# Patient Record
Sex: Male | Born: 1945
Health system: Southern US, Community
[De-identification: ages and names within clinical notes are randomized; demographics above are authoritative.]

## PROBLEM LIST (undated history)

## (undated) DIAGNOSIS — Z9622 Myringotomy tube(s) status: Secondary | ICD-10-CM

## (undated) DIAGNOSIS — I10 Essential (primary) hypertension: Secondary | ICD-10-CM

## (undated) DIAGNOSIS — F329 Major depressive disorder, single episode, unspecified: Secondary | ICD-10-CM

## (undated) DIAGNOSIS — E039 Hypothyroidism, unspecified: Secondary | ICD-10-CM

## (undated) DIAGNOSIS — D696 Thrombocytopenia, unspecified: Secondary | ICD-10-CM

## (undated) DIAGNOSIS — E785 Hyperlipidemia, unspecified: Secondary | ICD-10-CM

## (undated) DIAGNOSIS — F32A Depression, unspecified: Secondary | ICD-10-CM

## (undated) DIAGNOSIS — N9989 Other postprocedural complications and disorders of genitourinary system: Secondary | ICD-10-CM

## (undated) DIAGNOSIS — C4491 Basal cell carcinoma of skin, unspecified: Secondary | ICD-10-CM

## (undated) DIAGNOSIS — T7840XA Allergy, unspecified, initial encounter: Secondary | ICD-10-CM

## (undated) DIAGNOSIS — H269 Unspecified cataract: Secondary | ICD-10-CM

## (undated) DIAGNOSIS — M1990 Unspecified osteoarthritis, unspecified site: Secondary | ICD-10-CM

## (undated) DIAGNOSIS — E119 Type 2 diabetes mellitus without complications: Secondary | ICD-10-CM

## (undated) DIAGNOSIS — H6613 Chronic tubotympanic suppurative otitis media, bilateral: Secondary | ICD-10-CM

## (undated) DIAGNOSIS — H6980 Other specified disorders of Eustachian tube, unspecified ear: Secondary | ICD-10-CM

## (undated) DIAGNOSIS — I2510 Atherosclerotic heart disease of native coronary artery without angina pectoris: Secondary | ICD-10-CM

## (undated) DIAGNOSIS — F419 Anxiety disorder, unspecified: Secondary | ICD-10-CM

## (undated) DIAGNOSIS — R338 Other retention of urine: Secondary | ICD-10-CM

## (undated) DIAGNOSIS — G43909 Migraine, unspecified, not intractable, without status migrainosus: Secondary | ICD-10-CM

## (undated) DIAGNOSIS — G459 Transient cerebral ischemic attack, unspecified: Secondary | ICD-10-CM

## (undated) DIAGNOSIS — K648 Other hemorrhoids: Secondary | ICD-10-CM

## (undated) HISTORY — DX: Thrombocytopenia, unspecified: D69.6

## (undated) HISTORY — DX: Migraine, unspecified, not intractable, without status migrainosus: G43.909

## (undated) HISTORY — DX: Hypothyroidism, unspecified: E03.9

## (undated) HISTORY — DX: Major depressive disorder, single episode, unspecified: F32.9

## (undated) HISTORY — DX: Myringotomy tube(s) status: Z96.22

## (undated) HISTORY — DX: Other postprocedural complications and disorders of genitourinary system: N99.89

## (undated) HISTORY — DX: Other hemorrhoids: K64.8

## (undated) HISTORY — DX: Depression, unspecified: F32.A

## (undated) HISTORY — DX: Basal cell carcinoma of skin, unspecified: C44.91

## (undated) HISTORY — PX: TONSILLECTOMY: SUR1361

## (undated) HISTORY — DX: Allergy, unspecified, initial encounter: T78.40XA

## (undated) HISTORY — DX: Hyperlipidemia, unspecified: E78.5

## (undated) HISTORY — DX: Transient cerebral ischemic attack, unspecified: G45.9

## (undated) HISTORY — DX: Atherosclerotic heart disease of native coronary artery without angina pectoris: I25.10

## (undated) HISTORY — DX: Essential (primary) hypertension: I10

## (undated) HISTORY — DX: Other retention of urine: R33.8

## (undated) HISTORY — DX: Unspecified cataract: H26.9

## (undated) HISTORY — PX: UPPER GI ENDOSCOPY: SHX6162

## (undated) HISTORY — DX: Unspecified osteoarthritis, unspecified site: M19.90

## (undated) HISTORY — PX: OTHER SURGICAL HISTORY: SHX169

---

## 1999-06-28 ENCOUNTER — Ambulatory Visit (HOSPITAL_COMMUNITY): Admission: RE | Admit: 1999-06-28 | Discharge: 1999-06-28 | Payer: Self-pay | Admitting: Internal Medicine

## 1999-06-28 ENCOUNTER — Encounter: Payer: Self-pay | Admitting: Internal Medicine

## 2001-09-28 ENCOUNTER — Ambulatory Visit (HOSPITAL_BASED_OUTPATIENT_CLINIC_OR_DEPARTMENT_OTHER): Admission: RE | Admit: 2001-09-28 | Discharge: 2001-09-28 | Payer: Self-pay | Admitting: General Surgery

## 2001-12-16 ENCOUNTER — Emergency Department (HOSPITAL_COMMUNITY): Admission: EM | Admit: 2001-12-16 | Discharge: 2001-12-16 | Payer: Self-pay | Admitting: *Deleted

## 2002-02-21 ENCOUNTER — Inpatient Hospital Stay (HOSPITAL_COMMUNITY): Admission: EM | Admit: 2002-02-21 | Discharge: 2002-02-23 | Payer: Self-pay | Admitting: Emergency Medicine

## 2002-02-22 ENCOUNTER — Encounter: Payer: Self-pay | Admitting: Family Medicine

## 2002-03-13 ENCOUNTER — Encounter: Admission: RE | Admit: 2002-03-13 | Discharge: 2002-03-13 | Payer: Self-pay | Admitting: Family Medicine

## 2002-03-13 ENCOUNTER — Encounter: Payer: Self-pay | Admitting: Family Medicine

## 2002-03-25 ENCOUNTER — Ambulatory Visit (HOSPITAL_COMMUNITY): Admission: RE | Admit: 2002-03-25 | Discharge: 2002-03-25 | Payer: Self-pay | Admitting: Family Medicine

## 2002-03-25 ENCOUNTER — Encounter: Payer: Self-pay | Admitting: Family Medicine

## 2002-06-29 ENCOUNTER — Emergency Department (HOSPITAL_COMMUNITY): Admission: EM | Admit: 2002-06-29 | Discharge: 2002-06-29 | Payer: Self-pay

## 2002-07-10 HISTORY — PX: THYROIDECTOMY, PARTIAL: SHX18

## 2002-07-31 ENCOUNTER — Ambulatory Visit (HOSPITAL_COMMUNITY): Admission: RE | Admit: 2002-07-31 | Discharge: 2002-08-01 | Payer: Self-pay | Admitting: General Surgery

## 2002-11-14 ENCOUNTER — Ambulatory Visit (HOSPITAL_COMMUNITY): Admission: RE | Admit: 2002-11-14 | Discharge: 2002-11-14 | Payer: Self-pay | Admitting: *Deleted

## 2003-06-09 ENCOUNTER — Ambulatory Visit (HOSPITAL_COMMUNITY): Admission: RE | Admit: 2003-06-09 | Discharge: 2003-06-10 | Payer: Self-pay | Admitting: *Deleted

## 2003-06-09 ENCOUNTER — Encounter: Payer: Self-pay | Admitting: *Deleted

## 2003-12-26 ENCOUNTER — Encounter: Payer: Self-pay | Admitting: Gastroenterology

## 2004-09-15 ENCOUNTER — Ambulatory Visit: Payer: Self-pay | Admitting: Internal Medicine

## 2004-10-10 DIAGNOSIS — G459 Transient cerebral ischemic attack, unspecified: Secondary | ICD-10-CM

## 2004-10-10 HISTORY — DX: Transient cerebral ischemic attack, unspecified: G45.9

## 2004-12-14 ENCOUNTER — Ambulatory Visit: Payer: Self-pay | Admitting: Family Medicine

## 2005-02-09 ENCOUNTER — Ambulatory Visit: Payer: Self-pay | Admitting: Family Medicine

## 2005-02-21 ENCOUNTER — Ambulatory Visit: Payer: Self-pay | Admitting: *Deleted

## 2005-06-06 ENCOUNTER — Ambulatory Visit: Payer: Self-pay | Admitting: Family Medicine

## 2005-06-20 ENCOUNTER — Ambulatory Visit (HOSPITAL_COMMUNITY): Admission: RE | Admit: 2005-06-20 | Discharge: 2005-06-20 | Payer: Self-pay | Admitting: Orthopedic Surgery

## 2005-09-22 ENCOUNTER — Ambulatory Visit: Payer: Self-pay | Admitting: Cardiology

## 2005-10-10 HISTORY — PX: HERNIA REPAIR: SHX51

## 2006-06-06 ENCOUNTER — Ambulatory Visit: Payer: Self-pay | Admitting: Family Medicine

## 2006-07-03 ENCOUNTER — Ambulatory Visit: Payer: Self-pay | Admitting: Cardiology

## 2006-07-25 ENCOUNTER — Ambulatory Visit (HOSPITAL_COMMUNITY): Admission: RE | Admit: 2006-07-25 | Discharge: 2006-07-26 | Payer: Self-pay | Admitting: General Surgery

## 2006-12-07 ENCOUNTER — Encounter: Payer: Self-pay | Admitting: Family Medicine

## 2007-06-12 ENCOUNTER — Ambulatory Visit: Payer: Self-pay | Admitting: Cardiology

## 2007-06-19 ENCOUNTER — Ambulatory Visit: Payer: Self-pay

## 2007-06-19 ENCOUNTER — Encounter: Payer: Self-pay | Admitting: Family Medicine

## 2007-06-29 ENCOUNTER — Encounter (INDEPENDENT_AMBULATORY_CARE_PROVIDER_SITE_OTHER): Payer: Self-pay | Admitting: *Deleted

## 2007-07-05 ENCOUNTER — Encounter: Payer: Self-pay | Admitting: Family Medicine

## 2007-07-10 ENCOUNTER — Ambulatory Visit: Payer: Self-pay | Admitting: Family Medicine

## 2007-07-10 DIAGNOSIS — F329 Major depressive disorder, single episode, unspecified: Secondary | ICD-10-CM

## 2007-07-10 DIAGNOSIS — E785 Hyperlipidemia, unspecified: Secondary | ICD-10-CM

## 2007-07-10 DIAGNOSIS — E039 Hypothyroidism, unspecified: Secondary | ICD-10-CM

## 2007-07-10 DIAGNOSIS — M159 Polyosteoarthritis, unspecified: Secondary | ICD-10-CM

## 2007-07-10 DIAGNOSIS — E1169 Type 2 diabetes mellitus with other specified complication: Secondary | ICD-10-CM

## 2007-07-10 DIAGNOSIS — R51 Headache: Secondary | ICD-10-CM

## 2007-07-10 DIAGNOSIS — M1990 Unspecified osteoarthritis, unspecified site: Secondary | ICD-10-CM

## 2007-07-10 HISTORY — DX: Type 2 diabetes mellitus with other specified complication: E11.69

## 2007-07-10 HISTORY — DX: Major depressive disorder, single episode, unspecified: F32.9

## 2007-07-10 HISTORY — DX: Polyosteoarthritis, unspecified: M15.9

## 2007-07-10 HISTORY — DX: Hyperlipidemia, unspecified: E78.5

## 2007-07-10 HISTORY — DX: Hypothyroidism, unspecified: E03.9

## 2007-08-30 ENCOUNTER — Ambulatory Visit: Payer: Self-pay | Admitting: Family Medicine

## 2007-10-01 ENCOUNTER — Encounter: Payer: Self-pay | Admitting: Family Medicine

## 2008-01-30 ENCOUNTER — Encounter: Payer: Self-pay | Admitting: Family Medicine

## 2008-05-26 ENCOUNTER — Encounter: Payer: Self-pay | Admitting: Family Medicine

## 2008-06-18 ENCOUNTER — Ambulatory Visit: Payer: Self-pay | Admitting: Cardiology

## 2008-07-08 ENCOUNTER — Ambulatory Visit: Payer: Self-pay | Admitting: Family Medicine

## 2008-11-12 ENCOUNTER — Encounter: Payer: Self-pay | Admitting: Family Medicine

## 2008-12-04 ENCOUNTER — Encounter: Payer: Self-pay | Admitting: Family Medicine

## 2008-12-05 ENCOUNTER — Encounter: Payer: Self-pay | Admitting: Cardiology

## 2008-12-08 ENCOUNTER — Encounter: Payer: Self-pay | Admitting: Cardiology

## 2008-12-11 ENCOUNTER — Encounter: Payer: Self-pay | Admitting: Cardiology

## 2008-12-11 ENCOUNTER — Ambulatory Visit: Payer: Self-pay | Admitting: Cardiology

## 2008-12-11 DIAGNOSIS — I10 Essential (primary) hypertension: Secondary | ICD-10-CM

## 2008-12-11 DIAGNOSIS — I209 Angina pectoris, unspecified: Secondary | ICD-10-CM | POA: Insufficient documentation

## 2008-12-11 DIAGNOSIS — I2510 Atherosclerotic heart disease of native coronary artery without angina pectoris: Secondary | ICD-10-CM

## 2008-12-11 HISTORY — DX: Essential (primary) hypertension: I10

## 2008-12-11 HISTORY — DX: Atherosclerotic heart disease of native coronary artery without angina pectoris: I25.10

## 2008-12-11 HISTORY — DX: Angina pectoris, unspecified: I20.9

## 2008-12-11 LAB — CONVERTED CEMR LAB
BUN: 20 mg/dL (ref 6–23)
Basophils Absolute: 0 10*3/uL (ref 0.0–0.1)
Basophils Relative: 0.5 % (ref 0.0–3.0)
CO2: 30 meq/L (ref 19–32)
Calcium: 8.7 mg/dL (ref 8.4–10.5)
Chloride: 107 meq/L (ref 96–112)
Creatinine, Ser: 1.2 mg/dL (ref 0.4–1.5)
Eosinophils Absolute: 0.6 10*3/uL (ref 0.0–0.7)
Eosinophils Relative: 12.2 % — ABNORMAL HIGH (ref 0.0–5.0)
GFR calc Af Amer: 78 mL/min
GFR calc non Af Amer: 65 mL/min
Glucose, Bld: 120 mg/dL — ABNORMAL HIGH (ref 70–99)
HCT: 42.1 % (ref 39.0–52.0)
Hemoglobin: 14 g/dL (ref 13.0–17.0)
INR: 1 (ref 0.8–1.0)
Lymphocytes Relative: 24.1 % (ref 12.0–46.0)
MCHC: 33.3 g/dL (ref 30.0–36.0)
MCV: 87.9 fL (ref 78.0–100.0)
Monocytes Absolute: 0.5 10*3/uL (ref 0.1–1.0)
Monocytes Relative: 8.8 % (ref 3.0–12.0)
Neutro Abs: 2.8 10*3/uL (ref 1.4–7.7)
Neutrophils Relative %: 54.4 % (ref 43.0–77.0)
Platelets: 139 10*3/uL — ABNORMAL LOW (ref 150–400)
Potassium: 3.8 meq/L (ref 3.5–5.1)
Prothrombin Time: 10.8 s — ABNORMAL LOW (ref 10.9–13.3)
RBC: 4.79 M/uL (ref 4.22–5.81)
RDW: 12.5 % (ref 11.5–14.6)
Sodium: 141 meq/L (ref 135–145)
WBC: 5.2 10*3/uL (ref 4.5–10.5)
aPTT: 24.5 s (ref 21.7–29.8)

## 2008-12-16 ENCOUNTER — Ambulatory Visit: Payer: Self-pay | Admitting: Cardiology

## 2008-12-16 ENCOUNTER — Inpatient Hospital Stay (HOSPITAL_BASED_OUTPATIENT_CLINIC_OR_DEPARTMENT_OTHER): Admission: RE | Admit: 2008-12-16 | Discharge: 2008-12-16 | Payer: Self-pay | Admitting: Cardiology

## 2009-01-02 ENCOUNTER — Ambulatory Visit: Payer: Self-pay | Admitting: Cardiology

## 2009-01-02 ENCOUNTER — Encounter: Payer: Self-pay | Admitting: *Deleted

## 2009-02-26 ENCOUNTER — Encounter: Payer: Self-pay | Admitting: Cardiology

## 2009-02-26 ENCOUNTER — Encounter: Payer: Self-pay | Admitting: Family Medicine

## 2009-04-27 ENCOUNTER — Encounter (INDEPENDENT_AMBULATORY_CARE_PROVIDER_SITE_OTHER): Payer: Self-pay | Admitting: *Deleted

## 2009-06-30 ENCOUNTER — Encounter: Payer: Self-pay | Admitting: Family Medicine

## 2009-06-30 ENCOUNTER — Encounter: Payer: Self-pay | Admitting: Cardiology

## 2009-07-08 ENCOUNTER — Ambulatory Visit: Payer: Self-pay | Admitting: Family Medicine

## 2009-07-08 DIAGNOSIS — J069 Acute upper respiratory infection, unspecified: Secondary | ICD-10-CM | POA: Insufficient documentation

## 2009-07-16 ENCOUNTER — Ambulatory Visit: Payer: Self-pay | Admitting: Cardiology

## 2009-08-18 ENCOUNTER — Ambulatory Visit: Payer: Self-pay | Admitting: Family Medicine

## 2009-08-18 LAB — CONVERTED CEMR LAB
OCCULT 1: NEGATIVE
OCCULT 2: NEGATIVE
OCCULT 3: NEGATIVE

## 2009-10-10 HISTORY — PX: POLYPECTOMY: SHX149

## 2009-10-10 HISTORY — PX: COLONOSCOPY: SHX174

## 2009-11-06 ENCOUNTER — Encounter: Payer: Self-pay | Admitting: Family Medicine

## 2010-02-05 ENCOUNTER — Encounter: Payer: Self-pay | Admitting: Gastroenterology

## 2010-02-10 ENCOUNTER — Telehealth (INDEPENDENT_AMBULATORY_CARE_PROVIDER_SITE_OTHER): Payer: Self-pay

## 2010-02-23 ENCOUNTER — Encounter: Payer: Self-pay | Admitting: Family Medicine

## 2010-02-24 ENCOUNTER — Encounter (INDEPENDENT_AMBULATORY_CARE_PROVIDER_SITE_OTHER): Payer: Self-pay | Admitting: *Deleted

## 2010-06-21 ENCOUNTER — Encounter: Payer: Self-pay | Admitting: Family Medicine

## 2010-06-21 LAB — CONVERTED CEMR LAB: HDL: 64 mg/dL

## 2010-07-06 ENCOUNTER — Ambulatory Visit: Payer: Self-pay | Admitting: Family Medicine

## 2010-07-06 DIAGNOSIS — E1165 Type 2 diabetes mellitus with hyperglycemia: Secondary | ICD-10-CM | POA: Insufficient documentation

## 2010-07-06 DIAGNOSIS — E1151 Type 2 diabetes mellitus with diabetic peripheral angiopathy without gangrene: Secondary | ICD-10-CM

## 2010-07-06 DIAGNOSIS — E119 Type 2 diabetes mellitus without complications: Secondary | ICD-10-CM

## 2010-07-06 HISTORY — DX: Type 2 diabetes mellitus without complications: E11.9

## 2010-07-08 ENCOUNTER — Ambulatory Visit: Payer: Self-pay | Admitting: Family Medicine

## 2010-07-08 ENCOUNTER — Telehealth: Payer: Self-pay | Admitting: Gastroenterology

## 2010-07-08 DIAGNOSIS — K921 Melena: Secondary | ICD-10-CM | POA: Insufficient documentation

## 2010-07-12 ENCOUNTER — Ambulatory Visit: Payer: Self-pay | Admitting: Internal Medicine

## 2010-07-12 ENCOUNTER — Encounter: Payer: Self-pay | Admitting: Gastroenterology

## 2010-07-12 DIAGNOSIS — Z860100 Personal history of colon polyps, unspecified: Secondary | ICD-10-CM | POA: Insufficient documentation

## 2010-07-12 DIAGNOSIS — K648 Other hemorrhoids: Secondary | ICD-10-CM | POA: Insufficient documentation

## 2010-07-12 DIAGNOSIS — R195 Other fecal abnormalities: Secondary | ICD-10-CM

## 2010-07-12 DIAGNOSIS — K625 Hemorrhage of anus and rectum: Secondary | ICD-10-CM | POA: Insufficient documentation

## 2010-07-12 DIAGNOSIS — E0590 Thyrotoxicosis, unspecified without thyrotoxic crisis or storm: Secondary | ICD-10-CM

## 2010-07-12 DIAGNOSIS — I2510 Atherosclerotic heart disease of native coronary artery without angina pectoris: Secondary | ICD-10-CM

## 2010-07-12 HISTORY — DX: Thyrotoxicosis, unspecified without thyrotoxic crisis or storm: E05.90

## 2010-07-12 HISTORY — DX: Personal history of colonic polyps: Z86.010

## 2010-07-12 HISTORY — DX: Personal history of colon polyps, unspecified: Z86.0100

## 2010-07-22 ENCOUNTER — Ambulatory Visit: Payer: Self-pay | Admitting: Cardiology

## 2010-07-22 DIAGNOSIS — R0989 Other specified symptoms and signs involving the circulatory and respiratory systems: Secondary | ICD-10-CM

## 2010-07-22 HISTORY — DX: Other specified symptoms and signs involving the circulatory and respiratory systems: R09.89

## 2010-07-27 ENCOUNTER — Ambulatory Visit: Payer: Self-pay | Admitting: Gastroenterology

## 2010-07-28 ENCOUNTER — Encounter: Payer: Self-pay | Admitting: Gastroenterology

## 2010-08-05 ENCOUNTER — Ambulatory Visit: Payer: Self-pay

## 2010-08-05 ENCOUNTER — Encounter: Payer: Self-pay | Admitting: Cardiology

## 2010-10-28 ENCOUNTER — Ambulatory Visit
Admission: RE | Admit: 2010-10-28 | Discharge: 2010-10-28 | Payer: Self-pay | Source: Home / Self Care | Attending: Family Medicine | Admitting: Family Medicine

## 2010-10-28 DIAGNOSIS — J1189 Influenza due to unidentified influenza virus with other manifestations: Secondary | ICD-10-CM

## 2010-10-28 LAB — CONVERTED CEMR LAB: Inflenza A Ag: NEGATIVE

## 2010-11-09 NOTE — Letter (Signed)
Summary: Patient Notice- Polyp Results  [facility]  [address] [place], [place] [address]   Phone: [phone]  Fax: [phone]        July 28, 2010 MRN: [id number]    [name] [address] [place], [place]  [address]    Dear [name],  I am pleased to inform you that the colon polyp(s) removed during your recent colonoscopy was (were) found to be benign (no cancer detected) upon pathologic examination.  I recommend you have a repeat colonoscopy examination in 5 years to look for recurrent polyps, as having colon polyps increases your risk for having recurrent polyps or even colon cancer in the future.  Should you develop new or worsening symptoms of abdominal pain, bowel habit changes or bleeding from the rectum or bowels, please schedule an evaluation with either your primary care physician or with me.  Continue treatment plan as outlined the day of your exam.  Please call us if you are having persistent problems or have questions about your condition that have not been fully answered at this time.  Sincerely,  Meryl Dare MD [facility]  This letter has been electronically signed by your physician.  Appended Document: Patient Notice- Polyp Results letter mailed

## 2010-11-09 NOTE — Letter (Signed)
Summary: [facility] Instructions  [facility]  [address] [place], [place] [address]   Phone: [phone]  Fax: [phone]       [name]    [birth year]    MRN: [id number]        Procedure Day /Date: 07-27-10     Arrival Time: 9:00 AM      Procedure Time: 10:00 AM     Location of Procedure:                    X      [facility] (4th Floor)                       PREPARATION FOR COLONOSCOPY WITH MOVIPREP   Starting 5 days prior to your procedure 07-22-10 do not eat nuts, seeds, popcorn, corn, beans, peas,  salads, or any raw vegetables.  Do not take any fiber supplements (e.g. Metamucil, Citrucel, and Benefiber).  THE DAY BEFORE YOUR PROCEDURE         DATE: 07-26-10  DAY: Monday  1.  Drink clear liquids the entire day-NO SOLID FOOD  2.  Do not drink anything colored red or purple.  Avoid juices with pulp.  No orange juice.  3.  Drink at least 64 oz. (8 glasses) of fluid/clear liquids during the day to prevent dehydration and help the prep work efficiently.  CLEAR LIQUIDS INCLUDE: Water Jello Ice Popsicles Tea (sugar ok, no milk/cream) Powdered fruit flavored drinks Coffee (sugar ok, no milk/cream) Gatorade Juice: apple, white grape, white cranberry  Lemonade Clear bullion, consomm, broth Carbonated beverages (any kind) Strained chicken noodle soup Hard Candy                             4.  In the morning, mix first dose of MoviPrep solution:    Empty 1 Pouch A and 1 Pouch B into the disposable container    Add lukewarm drinking water to the top line of the container. Mix to dissolve    Refrigerate (mixed solution should be used within 24 hrs)  5.  Begin drinking the prep at 5:00 p.m. The MoviPrep container is divided by 4 marks.   Every 15 minutes drink the solution down to the next mark (approximately 8 oz) until the full liter is complete.   6.  Follow completed prep with 16 oz of clear liquid of your choice (Nothing red or  purple).  Continue to drink clear liquids until bedtime.  7.  Before going to bed, mix second dose of MoviPrep solution:    Empty 1 Pouch A and 1 Pouch B into the disposable container    Add lukewarm drinking water to the top line of the container. Mix to dissolve    Refrigerate  THE DAY OF YOUR PROCEDURE      DATE: 07-27-10 DAY: Tuesday  Beginning at 5:00 AM  (5 hours before procedure):         1. Every 15 minutes, drink the solution down to the next mark (approx 8 oz) until the full liter is complete.  2. Follow completed prep with 16 oz. of clear liquid of your choice.    3. You may drink clear liquids until 8:00 AM (2 HOURS BEFORE PROCEDURE).   MEDICATION INSTRUCTIONS  Unless otherwise instructed, you should take regular prescription medications with a small sip of water   as early as possible  the morning of your procedure.  Stay on the Aspirin you are taking.         OTHER INSTRUCTIONS  You will need a responsible adult at least 67 years of age to accompany you and drive you home.   This person must remain in the waiting room during your procedure.  Wear loose fitting clothing that is easily removed.  Leave jewelry and other valuables at home.  However, you may wish to bring a book to read or  an iPod/MP3 player to listen to music as you wait for your procedure to start.  Remove all body piercing jewelry and leave at home.  Total time from sign-in until discharge is approximately 2-3 hours.  You should go home directly after your procedure and rest.  You can resume normal activities the  day after your procedure.  The day of your procedure you should not:   Drive   Make legal decisions   Operate machinery   Drink alcohol   Return to work  You will receive specific instructions about eating, activities and medications before you leave.    The above instructions have been reviewed and explained to me by   _______________________    I fully  understand and can verbalize these instructions _____________________________ Date _________

## 2010-11-09 NOTE — Progress Notes (Signed)
Summary: Blood in stools   Phone Note From Other Clinic   Caller: [phone] Renee @ Dr Laury Axon Call For: Dr Russella Dar Summary of Call: Blood in stools would like to schedule appt for pt to be seen before Dec which is next available  Initial call taken by: Leanor Kail [facility],  July 08, 2010 4:28 PM  Follow-up for Phone Call        Renee cant reach pt. tonight for an appt. tomorrow, next week will be fine for pt. to be seen by an extender. I will call Renee back with an appt. when the extenders schedule is available.  Follow-up by: Laureen Ochs LPN,  July 08, 2010 4:36 PM  Additional Follow-up for Phone Call Additional follow up Details #1::        Pt. will see Willette Cluster NP on 07-12-10 at 10am. Renee will advise pt. of appt/med.list/co-pay/cx.policy. Additional Follow-up by: Laureen Ochs LPN,  July 09, 2010 8:54 AM

## 2010-11-09 NOTE — Progress Notes (Signed)
Summary: Med List Brought by Patient  Med List Brought by Patient   Imported By: Lanelle Bal 07/27/2010 14:28:12  _____________________________________________________________________  External Attachment:    Type:   Image     Comment:   External Document

## 2010-11-09 NOTE — Procedures (Signed)
Summary: COLONOSCOPY   Colonoscopy  Procedure date:  12/26/2003  Findings:      Location:  [facility].     Patient Name: [name], [name] MRN:  Procedure Procedures: Colonoscopy CPT: [phone].    with Hot Biopsy(s)CPT: [id number].  Personnel: Endoscopist: Venita Lick. Russella Dar, MD, [facility].  Exam Location: Exam performed in Outpatient Clinic. Outpatient  Patient Consent: Procedure, Alternatives, Risks and Benefits discussed, consent obtained, from patient. Consent was obtained by the RN.  Indications Symptoms: Hematochezia.  History  Current Medications: Patient is not currently taking Coumadin.  Pre-Exam Physical: Performed Dec 26, 2003. Entire physical exam was normal.  Exam Exam: Extent of exam reached: Cecum, extent intended: Cecum.  The cecum was identified by appendiceal orifice and IC valve. Colon retroflexion performed. ASA Classification: II. Tolerance: good.  Monitoring: Pulse and BP monitoring, Oximetry used. Supplemental O2 given.  Colon Prep Used Miralax for colon prep. Prep results: good.  Sedation Meds: Patient assessed and found to be appropriate for moderate (conscious) sedation. Fentanyl 75 mcg. given IV. Versed 7 mg. given IV.  Findings MULTIPLE POLYPS: Descending Colon to Sigmoid Colon. minimum size 3 mm, maximum size 4 mm. Procedure:  hot biopsy, removed, Polyp retrieved, 2 polyps Polyps sent to pathology. ICD9: Colon Polyps: 211.3.  NORMAL EXAM: Cecum to Splenic Flexure.  HEMORRHOIDS: Internal. Size: Small. Not bleeding. Not thrombosed. ICD9: Hemorrhoids, Internal: 455.0.   Assessment  Diagnoses: 211.3: Colon Polyps.  455.0: Hemorrhoids, Internal.   Events  Unplanned Interventions: No intervention was required.  Unplanned Events: There were no complications. Plans  Post Exam Instructions: No aspirin or non-steroidal containing medications: 2 weeks.  Medication Plan: Await pathology. Continue current medications.  Patient  Education: Patient given standard instructions for: Polyps. Hemorrhoids.  Disposition: After procedure patient sent to recovery. After recovery patient sent home.  Scheduling/Referral: Colonoscopy, to [facility] T. Russella Dar, MD, [facility], around Dec 25, 2008.    This report was created from the original endoscopy report, which was reviewed and signed by the above listed endoscopist.

## 2010-11-09 NOTE — Progress Notes (Signed)
Summary: Discuss colon recall   Phone Note Outgoing Call Call back at [facility] Phone [phone]   Call placed by: Darcey Nora RN, CGRN,  Feb 10, 2010 10:53 AM Call placed to: Patient Summary of Call: Left message for patient to call back to discuss recall letter and letter to Dr Russella Dar. Initial call taken by: Darcey Nora RN, CGRN,  Feb 10, 2010 10:53 AM  Follow-up for Phone Call        I spoke with the patient and reviewed Dr Anselm Jungling recommendations and why he has changed the recall.  I will mail them a copy of the pathology from 2005 for their records showing hyperplastic polyps.  The patient denies any change in bowel habits, rectal bleeding, other GI symptoms or family hx of colon CA.  Patient  will discuss with his wife and if they decide that they would like to proceed with the colon at this time they will call back to schedule.  Patient  is aware that if he would like to have a colon now Dr Russella Dar is happy to accomodate.  I will mail out the path today. Follow-up by: Darcey Nora RN, CGRN,  Feb 10, 2010 11:05 AM

## 2010-11-09 NOTE — Assessment & Plan Note (Signed)
Summary: YEARLY F/U       Allergies Added:   Primary Provider:  Loreen Freud, DO   History of Present Illness: [name] is a pleasant gentleman who has a history of PCI of his LAD in May 2003. His last cardiac  catheterization was performed on December 16, 2008 by Dr. Juanda Chance for exertional chest pain.  He was found to have a 30-40% LAD just distal to the stent.  There was no other obstructive disease noted.  The ejection fraction is 50%. He has been treated medically. I last saw him in October of 2010. Since then he has dyspnea with more extreme activities but not with routine activities. It is relieved with rest. It is not associated with chest pain. There is no orthopnea, PND or pedal edema. He does not have exertional chest pain, palpitations or syncope.  Current Medications (verified): 1)  Toprol Xl 50 Mg Tb24 (Metoprolol Succinate) .Marland Kitchen.. 1 By Mouth Once Daily 2)  Losartan Potassium-Hctz 50-12.5 Mg Tabs (Losartan Potassium-Hctz) .... One Tablet By Mouth Once Daily 3)  Vytorin 10-20 Mg  Tabs (Ezetimibe-Simvastatin) .Marland Kitchen.. 1 By Mouth At Bedtime 4)  Flomax 0.4 Mg  Cp24 (Tamsulosin Hcl) .Marland Kitchen.. 1 By Mouth At Bedtime 5)  Synthroid 112 Mcg  Tabs (Levothyroxine Sodium) .Marland Kitchen.. 1 By Mouth Once Daily 6)  Niaspan 750 Mg  Tbcr (Niacin (Antihyperlipidemic)) .... 2 By Mouth At Bedtime 7)  Celebrex 200 Mg Caps (Celecoxib) .... Take 1 Tab Once Daily 8)  Multivitamins   Tabs (Multiple Vitamin) .... One Tab Daily 9)  Fish Oil  Tabs .Marland Kitchen.. 4 Tabs Daily 10)  Glucosamine-Chondroitin   Caps (Glucosamine-Chondroit-Vit C-Mn) .Marland Kitchen.. 1 Tab Bid 11)  Co Q-10 30 Mg  Caps (Coenzyme Q10) .... Daily 12)  Aspirin 81 Mg  Tabs (Aspirin) .Marland Kitchen.. 1 Qd 13)  Policosanol 10 Mg Caps (Policosanol) .Marland Kitchen.. 1 Tab Qd 14)  Flurbiprofen 100 Mg Tabs (Flurbiprofen) .... As Needed 15)  Nitroglycerin 0.4 Mg/hr Pt24 (Nitroglycerin) .... As Needed 16)  Nasacort Aq 55 Mcg/act Aers (Triamcinolone Acetonide(Nasal)) .... 2 Sprays Each Nostril Once Daily 17)   Cinnamon 500 Mg Caps (Cinnamon) .Marland Kitchen.. 1 Tab By Mouth Two Times A Day 18)  Metformin Hcl 1000 Mg Tabs (Metformin Hcl) .Marland Kitchen.. 1 By Mouth Once Daily 19)  Proctosol Hc 2.5 % Crea (Hydrocortisone) .... As Directed 20)  Moviprep 100 Gm  Solr (Peg-Kcl-Nacl-Nasulf-Na Asc-C) .... As Per Prep Instructions.  Allergies (verified): 1)  ! Codeine 2)  ! Vicodin 3)  ! Crestor (Rosuvastatin Calcium) 4)  ! * Apples, Tomatoes  Past History:  Past Medical History: CORONARY ARTERY DISEASE-S/P TAXUS STENT 2003 LAD HYPERLIPIDEMIA-MIXED  HYPERTENSION, BENIGN HYPERTHYROIDISM HEMORRHOIDS-INTERNAL  PERSONAL HISTORY OF COLONIC POLYPS  DIABETES-TYPE 2 OSTEOARTHRITIS HYPOTHYROIDISM DEPRESSION   Past Surgical History: Coronary artery disease status post placement of drug-eluting stent in  the LAD in 2003, EF 65% then. Tonsillectomy Hernia repair right inguinal  07/25/06 right thyroid lobectomy 07/31/02  Social History: Reviewed history from 07/12/2010 and no changes required. Retired Married  Tobacco Use - Former Alcohol Use - no Drug Use - no no children  Review of Systems       no fevers or chills, productive cough, hemoptysis, dysphasia, odynophagia, melena, hematochezia, dysuria, hematuria, rash, seizure activity, orthopnea, PND, pedal edema, claudication. Remaining systems are negative.   Vital Signs:  Patient profile:   65 year old male Height:      70 inches Weight:      175 pounds BMI:  25.20 Pulse rate:   68 / minute Resp:     16 per minute BP sitting:   107 / 65  (right arm)  Vitals Entered By: Marrion Coy, CNA (July 22, 2010 8:20 AM)  Physical Exam  General:  Well-developed well-nourished in no acute distress.  Skin is warm and dry.  HEENT is normal.  Neck is supple. No thyromegaly.  Chest is clear to auscultation with normal expansion.  Cardiovascular exam is regular rate and rhythm.  Abdominal exam nontender or distended. No masses palpated. bruit  noted. Extremities show no edema. neuro grossly intact    EKG  Procedure date:  07/22/2010  Findings:      Sinus rhythm at a rate of 68. No ST changes.  Impression & Recommendations:  Problem # 1:  ABDOMINAL BRUIT (ICD-785.9)  Schedule abdominal ultrasound to exclude aneurysm.  Orders: Abdominal Aorta Duplex (Abd Aorta Duplex)  Problem # 2:  CORONARY ARTERY DISEASE (ICD-414.00)  Continue aspirin, beta blocker and statin. No recent chest pain. His updated medication list for this problem includes:    Toprol Xl 50 Mg Tb24 (Metoprolol succinate) .Marland Kitchen... 1 by mouth once daily    Aspirin 81 Mg Tabs (Aspirin) .Marland Kitchen... 1 qd    Nitroglycerin 0.4 Mg/hr Pt24 (Nitroglycerin) .Marland Kitchen... As needed  His updated medication list for this problem includes:    Toprol Xl 50 Mg Tb24 (Metoprolol succinate) .Marland Kitchen... 1 by mouth once daily    Aspirin 81 Mg Tabs (Aspirin) .Marland Kitchen... 1 qd    Nitroglycerin 0.4 Mg/hr Pt24 (Nitroglycerin) .Marland Kitchen... As needed  Orders: EKG w/ Interpretation (93000)  Problem # 3:  HYPERLIPIDEMIA-MIXED (ICD-272.4)  Continue present medications. Lipids and liver monitored by primary care. His updated medication list for this problem includes:    Vytorin 10-20 Mg Tabs (Ezetimibe-simvastatin) .Marland Kitchen... 1 by mouth at bedtime    Niaspan 750 Mg Tbcr (Niacin (antihyperlipidemic)) .Marland Kitchen... 2 by mouth at bedtime    Policosanol 10 Mg Caps (Policosanol) .Marland Kitchen... 1 tab qd  His updated medication list for this problem includes:    Vytorin 10-20 Mg Tabs (Ezetimibe-simvastatin) .Marland Kitchen... 1 by mouth at bedtime    Niaspan 750 Mg Tbcr (Niacin (antihyperlipidemic)) .Marland Kitchen... 2 by mouth at bedtime    Policosanol 10 Mg Caps (Policosanol) .Marland Kitchen... 1 tab qd  Problem # 4:  HYPERTENSION, BENIGN (ICD-401.1)  Blood pressure controlled on present medications. Will continue. Potassium and renal function monitored by primary care. His updated medication list for this problem includes:    Toprol Xl 50 Mg Tb24 (Metoprolol succinate)  .Marland Kitchen... 1 by mouth once daily    Losartan Potassium-hctz 50-12.5 Mg Tabs (Losartan potassium-hctz) ..... One tablet by mouth once daily    Aspirin 81 Mg Tabs (Aspirin) .Marland Kitchen... 1 qd  His updated medication list for this problem includes:    Toprol Xl 50 Mg Tb24 (Metoprolol succinate) .Marland Kitchen... 1 by mouth once daily    Losartan Potassium-hctz 50-12.5 Mg Tabs (Losartan potassium-hctz) ..... One tablet by mouth once daily    Aspirin 81 Mg Tabs (Aspirin) .Marland Kitchen... 1 qd  Problem # 5:  DIABETES-TYPE 2 (ICD-250.00)  His updated medication list for this problem includes:    Losartan Potassium-hctz 50-12.5 Mg Tabs (Losartan potassium-hctz) ..... One tablet by mouth once daily    Aspirin 81 Mg Tabs (Aspirin) .Marland Kitchen... 1 qd    Metformin Hcl 1000 Mg Tabs (Metformin hcl) .Marland Kitchen... 1 by mouth once daily  His updated medication list for this problem includes:    Losartan Potassium-hctz 50-12.5 Mg Tabs (  Losartan potassium-hctz) ..... One tablet by mouth once daily    Aspirin 81 Mg Tabs (Aspirin) .Marland Kitchen... 1 qd    Metformin Hcl 1000 Mg Tabs (Metformin hcl) .Marland Kitchen... 1 by mouth once daily  Problem # 6:  HYPOTHYROIDISM (ICD-244.9)  His updated medication list for this problem includes:    Synthroid 112 Mcg Tabs (Levothyroxine sodium) .Marland Kitchen... 1 by mouth once daily  His updated medication list for this problem includes:    Synthroid 112 Mcg Tabs (Levothyroxine sodium) .Marland Kitchen... 1 by mouth once daily  Patient Instructions: 1)  Your physician has requested that you have an abdominal aorta duplex. During this test, an ultrasound is used to evaluate the aorta. Allow 30 minutes for this exam. Do not eat after midnight the day before and avoid carbonated beverages. There are no restrictions or special instructions. 2)  Your physician recommends that you continue on your current medications as directed. Please refer to the Current Medication list given to you today. 3)  Your physician wants you to follow-up in: 1 year.  You will receive a  reminder letter in the mail two months in advance. If you don't receive a letter, please call our office to schedule the follow-up appointment.

## 2010-11-09 NOTE — Letter (Signed)
Summary: [facility]: Immunoassay Fecal Occult Blood (iFOB) Order Form  [facility] [place]  [address] [place], [place] [address]   Phone: [phone]  Fax: [phone]      [facility]: Immunoassay Fecal Occult Blood (iFOB) Order Form   July 08, 2010 MRN: [id number]   [name] 18-Apr-1946   Physicican Name: Dr.Lowne  Diagnosis Code: V56.71      Almeta Monas CMA (AAMA)

## 2010-11-09 NOTE — Miscellaneous (Signed)
  Clinical Lists Changes pt insurance requested change to preferred med. pt aware of change Deliah Goody, RN  Feb 24, 2010 9:00 AM  Medications: Changed medication from AVALIDE 150-12.5 MG TABS (IRBESARTAN-HYDROCHLOROTHIAZIDE) 1/2 TAB once daily to LOSARTAN POTASSIUM-HCTZ 50-12.5 MG TABS (LOSARTAN POTASSIUM-HCTZ) one tablet by mouth once daily - Signed Rx of LOSARTAN POTASSIUM-HCTZ 50-12.5 MG TABS (LOSARTAN POTASSIUM-HCTZ) one tablet by mouth once daily;  #30 x 12;  Signed;  Entered by: Deliah Goody, RN;  Authorized by: Ferman Hamming, MD, [facility];  Method used: Electronically to [facility] [address], [place], [place], [place]  [address], Ph: [phone], Fax: [phone]    Prescriptions: Claris Gladden POTASSIUM-HCTZ 50-12.5 MG TABS (LOSARTAN POTASSIUM-HCTZ) one tablet by mouth once daily  #30 x 12   Entered by:   Deliah Goody, RN   Authorized by:   Ferman Hamming, MD, [facility]   Signed by:   Deliah Goody, RN on 02/24/2010   Method used:   Electronically to        [facility]       [address]       [place], [place]  [address]       Ph: [phone]       Fax: [phone]   RxID:   [id number]

## 2010-11-09 NOTE — Procedures (Signed)
Summary: Colonoscopy  Patient: [name] Note: All result statuses are Final unless otherwise noted.  Tests: (1) Colonoscopy (COL)   COL Colonoscopy           DONE     [facility]     [address]     [place], [place]  [address]           COLONOSCOPY PROCEDURE REPORT     PATIENT:  [name], [name]  MR#:  [id number]     BIRTHDATE:  10-03-1946, 64 yrs. old  GENDER:  male     ENDOSCOPIST:  Judie Petit T. Russella Dar, MD, [facility]           PROCEDURE DATE:  07/27/2010     PROCEDURE:  Colonoscopy with biopsy     ASA CLASS:  Class II     INDICATIONS:  1) heme positive stool  2) hematochezia     MEDICATIONS:   Fentanyl 75 mcg IV, Versed 9 mg IV     DESCRIPTION OF PROCEDURE:   After the risks benefits and     alternatives of the procedure were thoroughly explained, informed     consent was obtained.  Digital rectal exam was performed and     revealed no abnormalities.   The LB [id number] endoscope     was introduced through the anus and advanced to the cecum, which     was identified by the ileocecal valve, without limitations.  The     quality of the prep was excellent, using MoviPrep.  The instrument     was then slowly withdrawn as the colon was fully examined.     <<PROCEDUREIMAGES>>     FINDINGS:  A sessile polyp was found at the hepatic flexure. It     was 3 mm in size. The polyp was removed using cold biopsy forceps.     A normal appearing cecum, ileocecal valve, and appendiceal orifice     were identified. The ascending, transverse, splenic flexure,     descending, sigmoid colon, and rectum appeared unremarkable.     Retroflexed views in the rectum revealed internal hemorrhoids,     small.  The time to cecum =  3  minutes. The scope was then     withdrawn (time =  9.67  min) from the patient and the procedure     completed.     COMPLICATIONS:  None           ENDOSCOPIC IMPRESSION:     1) 3 mm sessile polyp at the hepatic flexure     2) Internal hemorrhoids        RECOMMENDATIONS:     1) Await pathology results     2) If the polyp removed is adenomatous (pre-cancerous), repeat     colonoscopy in 5 years. Otherwise follow colorectal cancer     screening guidelines for "routine risk" patients with colonoscopy     in 10 years.           Venita Lick. Russella Dar, MD, [facility]           CC: Lelon Perla, DO           n.     eSIGNED:   Venita Lick. Stark at 07/27/2010 10:38 AM           Bjorn Pippin, [id number]  Note: An exclamation Dalaysia Harms (!) indicates a result that was not dispersed into the flowsheet. Document Creation Date: 07/27/2010 10:39 AM _______________________________________________________________________  (1) Order result  status: Final Collection or observation date-time: 07/27/2010 10:33 Requested date-time:  Receipt date-time:  Reported date-time:  Referring Physician:   Ordering Physician: Claudette Head [phone]) Specimen Source:  Source: Launa Grill Order Number: [id number] Lab site:   Appended Document: Colonoscopy     Procedures Next Due Date:    Colonoscopy: 07/2015

## 2010-11-09 NOTE — Assessment & Plan Note (Signed)
Summary: roa//lch   Vital Signs:  Patient profile:   65 year old male Weight:      178.2 pounds BMI:     25.66 Temp:     97.7 degrees F oral Pulse rate:   84 / minute Pulse rhythm:   regular BP sitting:   112 / 60  (right arm) Cuff size:   regular  Vitals Entered By: Almeta Monas CMA Duncan Dull) (July 06, 2010 8:45 AM) CC: F/U on meds   History of Present Illness: Pt here for f/u --pt sees endo q66m-- he check DM, lipids and thyroid.   Pt with no complaints.   Pt would like to come off prozac-- he has been on it several years and has never been off it.   Pt sees urology for prostate exam and sees cardiologist regularly as well.    Hypertension follow-up      This is a 65 year old man who presents for Hypertension follow-up.  The patient denies lightheadedness, urinary frequency, headaches, edema, impotence, rash, and fatigue.  The patient denies the following associated symptoms: chest pain, chest pressure, exercise intolerance, dyspnea, palpitations, syncope, leg edema, and pedal edema.  Compliance with medications (by patient report) has been near 100%.  The patient reports that dietary compliance has been good.  The patient reports exercising occasionally.  Adjunctive measures currently used by the patient include salt restriction.    Preventive Screening-Counseling & Management  Alcohol-Tobacco     Smoking Status: quit  Current Medications (verified): 1)  Toprol Xl 50 Mg Tb24 (Metoprolol Succinate) .Marland Kitchen.. 1 By Mouth Once Daily 2)  Losartan Potassium-Hctz 50-12.5 Mg Tabs (Losartan Potassium-Hctz) .... One Tablet By Mouth Once Daily 3)  Vytorin 10-20 Mg  Tabs (Ezetimibe-Simvastatin) .Marland Kitchen.. 1 By Mouth At Bedtime 4)  Flomax 0.4 Mg  Cp24 (Tamsulosin Hcl) .Marland Kitchen.. 1 By Mouth At Bedtime 5)  Synthroid 112 Mcg  Tabs (Levothyroxine Sodium) .Marland Kitchen.. 1 By Mouth Once Daily 6)  Niaspan 750 Mg  Tbcr (Niacin (Antihyperlipidemic)) .... 2 By Mouth At Bedtime 7)  Plavix 75 Mg  Tabs (Clopidogrel  Bisulfate) .Marland Kitchen.. 1 By Mouth Once Daily 8)  Prozac 10 Mg  Caps (Fluoxetine Hcl) .Marland Kitchen.. 1 By Mouth Once Daily 9)  Celebrex 200 Mg Caps (Celecoxib) .... Take 1 Tab Once Daily 10)  Multivitamins   Tabs (Multiple Vitamin) .... One Tab Daily 11)  Fish Oil  Tabs .Marland Kitchen.. 4 Tabs Daily 12)  Glucosamine-Chondroitin   Caps (Glucosamine-Chondroit-Vit C-Mn) .Marland Kitchen.. 1 Tab Bid 13)  Co Q-10 30 Mg  Caps (Coenzyme Q10) .... Daily 14)  Aspirin 81 Mg  Tabs (Aspirin) .Marland Kitchen.. 1 Qd 15)  Policosanol 10 Mg Caps (Policosanol) .Marland Kitchen.. 1 Tab Qd 16)  Flurbiprofen 100 Mg Tabs (Flurbiprofen) .... As Needed 17)  Nitroglycerin 0.4 Mg/hr Pt24 (Nitroglycerin) .... As Needed 18)  Nasacort Aq 55 Mcg/act Aers (Triamcinolone Acetonide(Nasal)) .... 2 Sprays Each Nostril Once Daily 19)  Cinnamon 500 Mg Caps (Cinnamon) .Marland Kitchen.. 1 Tab By Mouth Two Times A Day 20)  Metformin Hcl 1000 Mg Tabs (Metformin Hcl) .Marland Kitchen.. 1 By Mouth Once Daily 21)  Proctosol Hc 2.5 % Crea (Hydrocortisone) .... As Directed  Allergies (verified): 1)  ! Codeine 2)  ! Vicodin 3)  ! Crestor (Rosuvastatin Calcium) 4)  ! * Apples, Tomatoes  Past History:  Past medical, surgical, family and social histories (including risk factors) reviewed for relevance to current acute and chronic problems.  Past Medical History: Reviewed history from 12/11/2008 and no changes required. Depression Hyperlipidemia Hypertension  Hypothyroidism Headache Osteoarthritis CAD  Past Surgical History: Reviewed history from 07/16/2009 and no changes required. Thyroidectomy Tonsillectomy Hernia repair right inguinal  07/25/06 right thyroid lobectomy 07/31/02  Family History: Reviewed history from 12/11/2008 and no changes required. Father:decease age 45 heart disease triple bypass Mother:decease age80 heart disease and Alzheimers Siblings:3 brothers hx of htn  Social History: Reviewed history from 12/11/2008 and no changes required. Full Time Married  Tobacco Use - Former. quit 36 years  ago Alcohol Use - no Drug Use - no no children  Review of Systems      See HPI  Physical Exam  General:  Well-developed,well-nourished,in no acute distress; alert,appropriate and cooperative throughout examination Ears:  External ear exam shows no significant lesions or deformities.  Otoscopic examination reveals clear canals, tympanic membranes are intact bilaterally without bulging, retraction, inflammation or discharge. Hearing is grossly normal bilaterally. Nose:  External nasal examination shows no deformity or inflammation. Nasal mucosa are pink and moist without lesions or exudates. Mouth:  Oral mucosa and oropharynx without lesions or exudates.  Teeth in good repair. Neck:  No deformities, masses, or tenderness noted. Lungs:  Normal respiratory effort, chest expands symmetrically. Lungs are clear to auscultation, no crackles or wheezes. Heart:  normal rate and no murmur.   Extremities:  No clubbing, cyanosis, edema, or deformity noted with normal full range of motion of all joints.   Skin:  Intact without suspicious lesions or rashes Cervical Nodes:  No lymphadenopathy noted Psych:  Cognition and judgment appear intact. Alert and cooperative with normal attention span and concentration. No apparent delusions, illusions, hallucinations   Impression & Recommendations:  Problem # 1:  HYPERLIPIDEMIA-MIXED (ICD-272.4)  His updated medication list for this problem includes:    Vytorin 10-20 Mg Tabs (Ezetimibe-simvastatin) .Marland Kitchen... 1 by mouth at bedtime    Niaspan 750 Mg Tbcr (Niacin (antihyperlipidemic)) .Marland Kitchen... 2 by mouth at bedtime    Policosanol 10 Mg Caps (Policosanol) .Marland Kitchen... 1 tab qd    HDL:64 (06/21/2010)  LDL:47 (06/21/2010)  Problem # 2:  HYPERTENSION, BENIGN (ICD-401.1)  His updated medication list for this problem includes:    Toprol Xl 50 Mg Tb24 (Metoprolol succinate) .Marland Kitchen... 1 by mouth once daily    Losartan Potassium-hctz 50-12.5 Mg Tabs (Losartan potassium-hctz) .....  One tablet by mouth once daily  BP today: 112/60 Prior BP: 118/70 (07/16/2009)  Labs Reviewed: K+: 3.8 (12/11/2008) Creat: : 1.2 (12/11/2008)   HDL: 64 (06/21/2010)   LDL: 47 (06/21/2010)     Problem # 3:  HYPOTHYROIDISM (ICD-244.9)  His updated medication list for this problem includes:    Synthroid 112 Mcg Tabs (Levothyroxine sodium) .Marland Kitchen... 1 by mouth once daily  Labs Reviewed: HDL: 64 (06/21/2010)   LDL: 47 (06/21/2010)     Problem # 4:  DIABETES-TYPE 2 (ICD-250.00)  His updated medication list for this problem includes:    Losartan Potassium-hctz 50-12.5 Mg Tabs (Losartan potassium-hctz) ..... One tablet by mouth once daily    Aspirin 81 Mg Tabs (Aspirin) .Marland Kitchen... 1 qd    Metformin Hcl 1000 Mg Tabs (Metformin hcl) .Marland Kitchen... 1 by mouth once daily  Labs Reviewed: Creat: 1.2 (12/11/2008)     Complete Medication List: 1)  Toprol Xl 50 Mg Tb24 (Metoprolol succinate) .Marland Kitchen.. 1 by mouth once daily 2)  Losartan Potassium-hctz 50-12.5 Mg Tabs (Losartan potassium-hctz) .... One tablet by mouth once daily 3)  Vytorin 10-20 Mg Tabs (Ezetimibe-simvastatin) .Marland Kitchen.. 1 by mouth at bedtime 4)  Flomax 0.4 Mg Cp24 (Tamsulosin hcl) .Marland Kitchen.. 1 by  mouth at bedtime 5)  Synthroid 112 Mcg Tabs (Levothyroxine sodium) .Marland Kitchen.. 1 by mouth once daily 6)  Niaspan 750 Mg Tbcr (Niacin (antihyperlipidemic)) .... 2 by mouth at bedtime 7)  Plavix 75 Mg Tabs (Clopidogrel bisulfate) .Marland Kitchen.. 1 by mouth once daily 8)  Prozac 10 Mg Caps (Fluoxetine hcl) .Marland Kitchen.. 1 by mouth once daily 9)  Celebrex 200 Mg Caps (Celecoxib) .... Take 1 tab once daily 10)  Multivitamins Tabs (Multiple vitamin) .... One tab daily 11)  Fish Oil Tabs  .Marland Kitchen.. 4 tabs daily 12)  Glucosamine-chondroitin Caps (Glucosamine-chondroit-vit c-mn) .Marland Kitchen.. 1 tab bid 13)  Co Q-10 30 Mg Caps (Coenzyme q10) .... Daily 14)  Aspirin 81 Mg Tabs (Aspirin) .Marland Kitchen.. 1 qd 15)  Policosanol 10 Mg Caps (Policosanol) .Marland Kitchen.. 1 tab qd 16)  Flurbiprofen 100 Mg Tabs (Flurbiprofen) .... As needed 17)   Nitroglycerin 0.4 Mg/hr Pt24 (Nitroglycerin) .... As needed 18)  Nasacort Aq 55 Mcg/act Aers (Triamcinolone acetonide(nasal)) .... 2 sprays each nostril once daily 19)  Cinnamon 500 Mg Caps (Cinnamon) .Marland Kitchen.. 1 tab by mouth two times a day 20)  Metformin Hcl 1000 Mg Tabs (Metformin hcl) .Marland Kitchen.. 1 by mouth once daily 21)  Proctosol Hc 2.5 % Crea (Hydrocortisone) .... As directed  Other Orders: Flu Vaccine 54yrs + MEDICARE PATIENTS (I4332) Administration Flu vaccine - MCR (R5188) Flu Vaccine Consent Questions     Do you have a history of severe allergic reactions to this vaccine? no    Any prior history of allergic reactions to egg and/or gelatin? no    Do you have a sensitivity to the preservative Thimersol? no    Do you have a past history of Guillan-Barre Syndrome? no    Do you currently have an acute febrile illness? no    Have you ever had a severe reaction to latex? no    Vaccine information given and explained to patient? yes    Are you currently pregnant? no    Lot Number:[id number]   Exp Date:04/09/2011   Site Given  Left Deltoid IM Flu Vaccine 25yrs + MEDICARE PATIENTS (C1660) Administration Flu vaccine - MCR (Y3016) Prescriptions: PROCTOSOL HC 2.5 % CREA (HYDROCORTISONE) as directed  #1 month x 5   Entered and Authorized by:   Loreen Freud DO   Signed by:   Loreen Freud DO on 07/06/2010   Method used:   Electronically to        [facility]       [address]       [place], [place]  [address]       Ph: [phone]       Fax: [phone]   RxID:   [id number]  [email or web address]   Last Flu Vaccine:  Fluvax MCR (07/08/2009 9:31:27 AM) Flu Vaccine Result Date:  07/06/2010 Flu Vaccine Result:  given Flu Vaccine Next Due:  1 yr HDL Result Date:  06/21/2010 HDL Result:  64 LDL Result Date:  06/21/2010 LDL Result:  47 PSA Result Date:  04/14/2010 PSA Result:  2.14 PSA Next Due:  6 mo

## 2010-11-09 NOTE — Assessment & Plan Note (Signed)
Summary: BLOOD IN STOOL             (DR.STARK PT.)   DEBORAH    History of Present Illness Visit Type: Initial Consult Primary GI MD: Lina Sar MD Primary Provider: Loreen Freud, DO Requesting Provider: Loreen Freud, DO Chief Complaint: Hem positive stool History of Present Illness:   PLEASANT 64 YO MALE,KNOWN TO DR. Russella Dar FROM PRIOR COLONOSCOPY IN 2005. HE HAD  2 SMALL POLYPS AND INTERNAL HEMORRHOIDS. PATH ON THE POLYPS SHOWED HYPERPLASTIC POLYPS.  HE COMES IN TODAY AFTER DOING HEMOCULT CARD AS PART OF A CHECK UP AND IS FOUND TO HAVE HEME +STOOL. HE AND HIS WIFE ARE CONCERNED AS  THEY WERE GIVEN  A LETTER AFTER THE LAST COLONOSCOPY THAT SAID HE HAD ADENOMATOUS POLYPS-THEN THEY RECEIVED A LETTER 2/10 THAT STATED HE DIDN'T NEED FOLLOW UP COLON UNTIL 2015. HE REPORTS INTERMITTENT SMALL AMTS OF BRB ON THE TISSUE WHICH HE ATTRIBUTES TO HEMORRHOIDS,AND HAS NO OTHER HEMORRHOID SXS.NO CHANGES IN BOWEL HABITS, NO ABDOMINAL PAIN, CRAMPING ETC. HE DENIES ANY HEARTBURN, INDIGESTION, DYSPHAGIA.    GI Review of Systems      Denies abdominal pain, acid reflux, belching, bloating, chest pain, dysphagia with liquids, dysphagia with solids, heartburn, loss of appetite, nausea, vomiting, vomiting blood, weight loss, and  weight gain.      Reports heme positive stool and  hemorrhoids.     Denies anal fissure, black tarry stools, change in bowel habit, constipation, diarrhea, diverticulosis, fecal incontinence, irritable bowel syndrome, jaundice, light color stool, liver problems, rectal bleeding, and  rectal pain.    Current Medications (verified): 1)  Toprol Xl 50 Mg Tb24 (Metoprolol Succinate) .Marland Kitchen.. 1 By Mouth Once Daily 2)  Losartan Potassium-Hctz 50-12.5 Mg Tabs (Losartan Potassium-Hctz) .... One Tablet By Mouth Once Daily 3)  Vytorin 10-20 Mg  Tabs (Ezetimibe-Simvastatin) .Marland Kitchen.. 1 By Mouth At Bedtime 4)  Flomax 0.4 Mg  Cp24 (Tamsulosin Hcl) .Marland Kitchen.. 1 By Mouth At Bedtime 5)  Synthroid 112 Mcg  Tabs  (Levothyroxine Sodium) .Marland Kitchen.. 1 By Mouth Once Daily 6)  Niaspan 750 Mg  Tbcr (Niacin (Antihyperlipidemic)) .... 2 By Mouth At Bedtime 7)  Celebrex 200 Mg Caps (Celecoxib) .... Take 1 Tab Once Daily 8)  Multivitamins   Tabs (Multiple Vitamin) .... One Tab Daily 9)  Fish Oil  Tabs .Marland Kitchen.. 4 Tabs Daily 10)  Glucosamine-Chondroitin   Caps (Glucosamine-Chondroit-Vit C-Mn) .Marland Kitchen.. 1 Tab Bid 11)  Co Q-10 30 Mg  Caps (Coenzyme Q10) .... Daily 12)  Aspirin 81 Mg  Tabs (Aspirin) .Marland Kitchen.. 1 Qd 13)  Policosanol 10 Mg Caps (Policosanol) .Marland Kitchen.. 1 Tab Qd 14)  Flurbiprofen 100 Mg Tabs (Flurbiprofen) .... As Needed 15)  Nitroglycerin 0.4 Mg/hr Pt24 (Nitroglycerin) .... As Needed 16)  Nasacort Aq 55 Mcg/act Aers (Triamcinolone Acetonide(Nasal)) .... 2 Sprays Each Nostril Once Daily 17)  Cinnamon 500 Mg Caps (Cinnamon) .Marland Kitchen.. 1 Tab By Mouth Two Times A Day 18)  Metformin Hcl 1000 Mg Tabs (Metformin Hcl) .Marland Kitchen.. 1 By Mouth Once Daily 19)  Proctosol Hc 2.5 % Crea (Hydrocortisone) .... As Directed  Allergies (verified): 1)  ! Codeine 2)  ! Vicodin 3)  ! Crestor (Rosuvastatin Calcium) 4)  ! * Apples, Tomatoes  Past History:  Past Medical History: Depression CORONARY ARTERY DISEASE-S/P TAXUS STENT 2003 LAD COLON POLYPS INT. HEMORRHOIDS Hyperlipidemia Hypertension Hypothyroidism Headache Osteoarthritis  Past Surgical History: Reviewed history from 07/16/2009 and no changes required. Thyroidectomy Tonsillectomy Hernia repair right inguinal  07/25/06 right thyroid lobectomy 07/31/02  Family History:  Reviewed history from 12/11/2008 and no changes required. Father:decease age 43 heart disease triple bypass Mother:decease age80 heart disease and Alzheimers Siblings:3 brothers hx of htn  Social History: Retired Married  Tobacco Use - Former. quit 36 years ago Alcohol Use - no Drug Use - no no children  Review of Systems       The patient complains of headaches-new.  The patient denies allergy/sinus, anemia,  anxiety-new, arthritis/joint pain, back pain, blood in urine, breast changes/lumps, change in vision, confusion, cough, coughing up blood, depression-new, fainting, fatigue, fever, hearing problems, heart murmur, heart rhythm changes, itching, menstrual pain, muscle pains/cramps, night sweats, nosebleeds, pregnancy symptoms, shortness of breath, skin rash, sleeping problems, sore throat, swelling of feet/legs, swollen lymph glands, thirst - excessive , urination - excessive , urination changes/pain, urine leakage, vision changes, and voice change.         SEE HPI  Vital Signs:  Patient profile:   65 year old male Height:      70 inches Weight:      177.38 pounds BMI:     25.54 Pulse rate:   68 / minute Pulse rhythm:   regular BP sitting:   104 / 70  (left arm) Cuff size:   regular  Vitals Entered By: June McMurray CMA Duncan Dull) (July 12, 2010 9:58 AM)  Physical Exam  General:  Well developed, well nourished, no acute distress. Head:  Normocephalic and atraumatic. Eyes:  PERRLA, no icterus. Lungs:  Clear throughout to auscultation. Heart:  Regular rate and rhythm; no murmurs, rubs,  or bruits. Abdomen:  SOFT, NONTENDER, NO MASS OR HSM,BS+ Rectal:  NOT DONE, RECENT STOOL HEMOCULT POSITIVE Extremities:  No clubbing, cyanosis, edema or deformities noted. Neurologic:  Alert and  oriented x4;  grossly normal neurologically. Psych:  Alert and cooperative. Normal mood and affect.   Impression & Recommendations:  Problem # 1:  BLOOD IN STOOL, OCCULT (ICD-792.1) Assessment New 64 YO MALE WITH HEMOCULT + STOOL, HX OF HYPERPLASTIC POLYPS 2005, AND INT. HEMORRHOIDS. PT WITH OCCASIONAL SMALL VOLUME HEMATOCHEZIA.  HEME POSITIVE STOOL MAY BE SECONDARY TO HEMORRHOIDS, HOWEVER CANNOT R/O RECURRENT POLYPS,OCCULT LESION.  SCHEDULE FOR COLONOSCOPY WITH DR. Jalene Mullet DISCUSSED IN DETAIL WITH PT AND WIFE Orders: Colonoscopy (Colon)  Problem # 2:  PERSONAL HISTORY OF COLONIC POLYPS  (ICD-V12.72) Assessment: Comment Only HYPERPLASTIC ON COLONOSCOPY 3/05 Orders: Colonoscopy (Colon)  Problem # 3:  CORONARY ARTERY DISEASE (ICD-414.00) Assessment: Comment Only S/P STENT, ON BABY ASA, NO THINNERS  Problem # 4:  HYPERLIPIDEMIA-MIXED (ICD-272.4) Assessment: Comment Only  Problem # 5:  HYPERTENSION, BENIGN (ICD-401.1) Assessment: Comment Only  Patient Instructions: 1)  We have scheduled the Colonoscopy with Dr. Claudette Head. 2)  Directions and brochure provided. 3)  [facility] Patient Information Guide given to patient. 4)  We sent the prescription for the Colonoscopy prep you will be drinking to [facility] [place]. 5)  Copy sent to : Loreen Freud , DO 6)  The medication list was reviewed and reconciled.  All changed / newly prescribed medications were explained.  A complete medication list was provided to the patient / caregiver. Prescriptions: MOVIPREP 100 GM  SOLR (PEG-KCL-NACL-NASULF-NA ASC-C) As per prep instructions.  #1 x 0   Entered by:   Lowry Ram NCMA   Authorized by:   Sammuel Cooper PA-c   Signed by:   Lowry Ram NCMA on 07/12/2010   Method used:   Electronically to        [facility] ([facility]ail)       [address]       [place], [place]  [address]       Ph: [phone]       Fax: [phone]   RxID:   [id number]

## 2010-11-09 NOTE — Letter (Signed)
Summary: Letter from patient/[facility]  Letter from patient/[facility]   Imported By: Lester Gower 02/12/2010 10:29:21  _____________________________________________________________________  External Attachment:    Type:   Image     Comment:   External Document

## 2010-11-11 NOTE — Assessment & Plan Note (Signed)
Summary: flu like symptoms , chills, aches, throwing up///sph   Vital Signs:  Patient profile:   65 year old male Weight:      174.8 pounds Temp:     98.2 degrees F oral BP sitting:   116 / 64  (right arm) Cuff size:   regular  Vitals Entered By: Almeta Monas CMA Duncan Dull) (October 28, 2010 3:08 PM) CC: x1 day c/o chils, bodyaches, and vomitted once, URI symptoms   History of Present Illness:       This is a 65 year old man who presents with URI symptoms.  The symptoms began 1 day ago.  The patient denies nasal congestion, clear nasal discharge, purulent nasal discharge, sore throat, dry cough, productive cough, earache, and sick contacts.  Associated symptoms include fever.  The patient also reports headache, muscle aches, and severe fatigue.  The patient denies itchy watery eyes, itchy throat, sneezing, seasonal symptoms, and response to antihistamine.  The patient denies the following risk factors for Strep sinusitis: unilateral facial pain, unilateral nasal discharge, poor response to decongestant, double sickening, tooth pain, Strep exposure, tender adenopathy, and absence of cough.    Current Medications (verified): 1)  Toprol Xl 50 Mg Tb24 (Metoprolol Succinate) .Marland Kitchen.. 1 By Mouth Once Daily 2)  Losartan Potassium-Hctz 50-12.5 Mg Tabs (Losartan Potassium-Hctz) .... One Tablet By Mouth Once Daily 3)  Vytorin 10-20 Mg  Tabs (Ezetimibe-Simvastatin) .Marland Kitchen.. 1 By Mouth At Bedtime 4)  Flomax 0.4 Mg  Cp24 (Tamsulosin Hcl) .Marland Kitchen.. 1 By Mouth At Bedtime 5)  Synthroid 112 Mcg  Tabs (Levothyroxine Sodium) .Marland Kitchen.. 1 By Mouth Once Daily 6)  Niaspan 750 Mg  Tbcr (Niacin (Antihyperlipidemic)) .... 2 By Mouth At Bedtime 7)  Celebrex 200 Mg Caps (Celecoxib) .... Take 1 Tab Once Daily 8)  Multivitamins   Tabs (Multiple Vitamin) .... One Tab Daily 9)  Fish Oil  Tabs .Marland Kitchen.. 4 Tabs Daily 10)  Glucosamine-Chondroitin   Caps (Glucosamine-Chondroit-Vit C-Mn) .Marland Kitchen.. 1 Tab Bid 11)  Co Q-10 30 Mg  Caps (Coenzyme Q10) ....  Daily 12)  Aspirin 81 Mg  Tabs (Aspirin) .Marland Kitchen.. 1 Qd 13)  Policosanol 10 Mg Caps (Policosanol) .Marland Kitchen.. 1 Tab Qd 14)  Flurbiprofen 100 Mg Tabs (Flurbiprofen) .... As Needed 15)  Nitroglycerin 0.4 Mg/hr Pt24 (Nitroglycerin) .... As Needed 16)  Nasacort Aq 55 Mcg/act Aers (Triamcinolone Acetonide(Nasal)) .... 2 Sprays Each Nostril Once Daily 17)  Cinnamon 500 Mg Caps (Cinnamon) .Marland Kitchen.. 1 Tab By Mouth Two Times A Day 18)  Metformin Hcl 1000 Mg Tabs (Metformin Hcl) .Marland Kitchen.. 1 By Mouth Once Daily 19)  Proctosol Hc 2.5 % Crea (Hydrocortisone) .... As Directed 20)  Silymarin  Caps (Milk Thistle-Turmeric) .... By Mouth Once Daily 21)  Ginger Root 550 Mg Caps (Ginger (Zingiber Officinalis)) .... By Mouth Once Daily 22)  L-Carnitine 250 Mg Caps (Levocarnitine) .... By Mouth Once Daily 23)  Tamiflu 75 Mg Caps (Oseltamivir Phosphate) .Marland Kitchen.. 1 By Mouth Two Times A Day For 2 1/2 More Days 24)  Promethazine Hcl 25 Mg Tabs (Promethazine Hcl) .Marland Kitchen.. 1 By Mouth Qid As Needed  Allergies (verified): 1)  ! Codeine 2)  ! Vicodin 3)  ! Crestor (Rosuvastatin Calcium) 4)  ! * Apples, Tomatoes  Past History:  Past Medical History: Last updated: 07/22/2010 CORONARY ARTERY DISEASE-S/P TAXUS STENT 2003 LAD HYPERLIPIDEMIA-MIXED  HYPERTENSION, BENIGN HYPERTHYROIDISM HEMORRHOIDS-INTERNAL  PERSONAL HISTORY OF COLONIC POLYPS  DIABETES-TYPE 2 OSTEOARTHRITIS HYPOTHYROIDISM DEPRESSION   Past Surgical History: Last updated: 07/22/2010 Coronary artery disease status post placement  of drug-eluting stent in  the LAD in 2003, EF 65% then. Tonsillectomy Hernia repair right inguinal  07/25/06 right thyroid lobectomy 07/31/02  Family History: Last updated: 01-Jan-2009 Father:decease age 34 heart disease triple bypass Mother:decease age80 heart disease and Alzheimers Siblings:3 brothers hx of htn  Social History: Last updated: 07/22/2010 Retired Married  Tobacco Use - Former Alcohol Use - no Drug Use - no no  children  Risk Factors: Smoking Status: quit (07/06/2010)  Family History: Reviewed history from January 01, 2009 and no changes required. Father:decease age 1 heart disease triple bypass Mother:decease age80 heart disease and Alzheimers Siblings:3 brothers hx of htn  Social History: Reviewed history from 07/22/2010 and no changes required. Retired Married  Tobacco Use - Former Alcohol Use - no Drug Use - no no children  Review of Systems      See HPI  Physical Exam  General:  Well-developed,well-nourished,in no acute distress; alert,appropriate and cooperative throughout examination Neck:  No deformities, masses, or tenderness noted. Lungs:  Normal respiratory effort, chest expands symmetrically. Lungs are clear to auscultation, no crackles or wheezes. Heart:  normal rate and no murmur.   Abdomen:  Bowel sounds positive,abdomen soft and non-tender without masses, organomegaly or hernias noted. Skin:  Intact without suspicious lesions or rashes Cervical Nodes:  No lymphadenopathy noted Psych:  Cognition and judgment appear intact. Alert and cooperative with normal attention span and concentration. No apparent delusions, illusions, hallucinations   Impression & Recommendations:  Problem # 1:  INFLUENZA WITH OTHER MANIFESTATIONS (ICD-487.8)  Rest, increase fluids, use Tylenol [phone] mg every 4-6 hours, and avoid contact with others. call office if no improvement in 5-7 days or if symptoms worsen.   Complete Medication List: 1)  Toprol Xl 50 Mg Tb24 (Metoprolol succinate) .Marland Kitchen.. 1 by mouth once daily 2)  Losartan Potassium-hctz 50-12.5 Mg Tabs (Losartan potassium-hctz) .... One tablet by mouth once daily 3)  Vytorin 10-20 Mg Tabs (Ezetimibe-simvastatin) .Marland Kitchen.. 1 by mouth at bedtime 4)  Flomax 0.4 Mg Cp24 (Tamsulosin hcl) .Marland Kitchen.. 1 by mouth at bedtime 5)  Synthroid 112 Mcg Tabs (Levothyroxine sodium) .Marland Kitchen.. 1 by mouth once daily 6)  Niaspan 750 Mg Tbcr (Niacin (antihyperlipidemic))  .... 2 by mouth at bedtime 7)  Celebrex 200 Mg Caps (Celecoxib) .... Take 1 tab once daily 8)  Multivitamins Tabs (Multiple vitamin) .... One tab daily 9)  Fish Oil Tabs  .Marland Kitchen.. 4 tabs daily 10)  Glucosamine-chondroitin Caps (Glucosamine-chondroit-vit c-mn) .Marland Kitchen.. 1 tab bid 11)  Co Q-10 30 Mg Caps (Coenzyme q10) .... Daily 12)  Aspirin 81 Mg Tabs (Aspirin) .Marland Kitchen.. 1 qd 13)  Policosanol 10 Mg Caps (Policosanol) .Marland Kitchen.. 1 tab qd 14)  Flurbiprofen 100 Mg Tabs (Flurbiprofen) .... As needed 15)  Nitroglycerin 0.4 Mg/hr Pt24 (Nitroglycerin) .... As needed 16)  Nasacort Aq 55 Mcg/act Aers (Triamcinolone acetonide(nasal)) .... 2 sprays each nostril once daily 17)  Cinnamon 500 Mg Caps (Cinnamon) .Marland Kitchen.. 1 tab by mouth two times a day 18)  Metformin Hcl 1000 Mg Tabs (Metformin hcl) .Marland Kitchen.. 1 by mouth once daily 19)  Proctosol Hc 2.5 % Crea (Hydrocortisone) .... As directed 20)  Silymarin Caps (Milk thistle-turmeric) .... By mouth once daily 21)  Ginger Root 550 Mg Caps (Ginger (zingiber officinalis)) .... By mouth once daily 22)  L-carnitine 250 Mg Caps (Levocarnitine) .... By mouth once daily 23)  Tamiflu 75 Mg Caps (Oseltamivir phosphate) .Marland Kitchen.. 1 by mouth two times a day for 2 1/2 more days 24)  Promethazine Hcl 25 Mg Tabs (Promethazine hcl) .Marland KitchenMarland KitchenMarland Kitchen  1 by mouth qid as needed  Patient Instructions: 1)  Get plenty of rest, drink lots of clear liquids, and use Tylenol or Ibuprofen for fever and comfort. Return in 7-10 days if you're not better: sooner if you'er feeling worse.  Prescriptions: PROMETHAZINE HCL 25 MG TABS (PROMETHAZINE HCL) 1 by mouth qid as needed  #20 x 0   Entered and Authorized by:   Loreen Freud DO   Signed by:   Loreen Freud DO on 10/28/2010   Method used:   Print then Give to Patient   RxID:   [id number] TAMIFLU 75 MG CAPS (OSELTAMIVIR PHOSPHATE) 1 by mouth two times a day for 2 1/2 more days  #5 x 0   Entered and Authorized by:   Loreen Freud DO   Signed by:   Loreen Freud DO on  10/28/2010   Method used:   Electronically to        [facility]       [address]       [place], [place]  [address]       Ph: [phone]       Fax: [phone]   RxID:   [id number]    Orders Added: 1)  Est. Patient Level III [43329]    Laboratory Results    Other Tests  Influenza A: negative Influenza B: negative

## 2010-12-22 LAB — GLUCOSE, CAPILLARY: Glucose-Capillary: 104 mg/dL — ABNORMAL HIGH (ref 70–99)

## 2011-01-20 LAB — POCT I-STAT 3, ART BLOOD GAS (G3+)
TCO2: 24 mmol/L (ref 0–100)
pCO2 arterial: 36 mmHg (ref 35.0–45.0)
pH, Arterial: 7.418 (ref 7.350–7.450)

## 2011-01-20 LAB — POCT I-STAT 3, VENOUS BLOOD GAS (G3P V)
Acid-base deficit: 3 mmol/L — ABNORMAL HIGH (ref 0.0–2.0)
pCO2, Ven: 39.4 mmHg — ABNORMAL LOW (ref 45.0–50.0)
pO2, Ven: 39 mmHg (ref 30.0–45.0)

## 2011-01-25 ENCOUNTER — Other Ambulatory Visit: Payer: Self-pay | Admitting: Cardiology

## 2011-02-22 NOTE — Assessment & Plan Note (Signed)
[facility]                            CARDIOLOGY OFFICE NOTE   TAYM, TWIST                        MRN:          [id number]  DATE:06/12/2007                            DOB:          1946/07/13    [name] is a very pleasant gentleman who has a history of coronary  disease.  He is status post TCI of his LAD in May 2003.  Note it was a  TAXUS stent.  His most recent Myoview was performed on Feb 13, 2004.  There was ischemia at the base of the septum and perhaps some scarring  in the base of the inferior septal wall, and note there were  electrocardiographic changes.  However, it was felt to be low risk, and  medical therapy has been recommended.  Since he was last seen, he has  dyspnea with dancing but otherwise denies any dyspnea on exertion,  orthopnea, PND, palpitations, presyncope, syncope, or chest pain.  He  occasionally has mild pedal edema.   MEDICATIONS:  1. Plavix 75 mg p.o. daily.  2. Toprol 50 mg p.o. daily.  3. Avalide 50/12.5 mg tabs 1/2 p.o. daily.  4. Vytorin 10/20 daily.  5. Fish oil.  6. Niaspan 1.5 g p.o. daily.  7. Synthroid 112 mcg p.o. daily.  8. Flomax.  9. Fluoxetine.  10.Multivitamin.  11.Glucosamine.  12.Co-Enzyme-Q.  13.Aspirin 81 mg p.o. daily.  14.Proscar 5 mg p.o. daily.   PHYSICAL EXAM:  Blood pressure 135/77.  His pulse is 67.  He weighs 176  pounds.  HEENT:  Normal.  NECK:  Supple.  He has no bruits.  CHEST:  Clear.  CARDIOVASCULAR:  Regular rate and rhythm.  ABDOMEN:  Benign with no bruits or pulsatile masses.  He has 2+ femoral  pulses bilaterally and no bruits.  EXTREMITIES:  No edema that I could palpate and no cords.  He has 2+  dorsalis pedis pulses bilaterally.   His electrocardiogram today shows a sinus rhythm at a rate of 64.  There  were no ST changes noted.  Note, he had his lipids checked on March 13, 2007, and his LDL was 61 with an HDL of 52.  Also of note, he had liver  functions  drawn on Mar 01, 2007, that were normal.   DIAGNOSES:  1. Coronary artery disease - The patient is doing reasonably well from      a symptomatic standpoint.  He does have some dyspnea on exertion.      I will check a stress Myoview for risk stratification.  If it      remains low risk, then we will continue with medical therapy.  He      will continue on his aspirin, Plavix, beta blocker, ARB, and      statin.  2. Hypertension - His blood pressure is well controlled on his present      medications.  3. Hyperlipidemia - He will continue on his statin.   We discussed risk factor modification today.  The patient does not  smoke.  I discussed the importance of diet  and exercise.  We will see  him back in 12 months.     Madolyn Frieze [name], MD, [facility]  Electronically Signed    BSC/MedQ  DD: 06/12/2007  DT: 06/12/2007  Job #: [id number]

## 2011-02-22 NOTE — Cardiovascular Report (Signed)
[name], [name]                 ACCOUNT NO.:  [id number]   MEDICAL RECORD NO.:  [id number]          PATIENT TYPE:  OIB   LOCATION:  1961                         FACILITY:  [facility]   PHYSICIAN:  Everardo Beals. 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  In 2003, he had a Taxus drug-eluting stent placed at LAD as  part of the Taxus 5 trial.  He has done quite well since that time, but  over the last couple of months, he has developed substernal chest  discomfort with vigorous activities such as dancing.  Dr. Juleen China arranged  for him to see Dr. [name] who scheduled him for evaluation with  angiography.   PROCEDURE:  The procedure was followed by the right femoral arteries,  arterial sheath, and 4-French preformed coronary catheters.  A front  wall arterial puncture was performed and an Omnipaque contrast was used.  The patient tolerated the procedure well and left the laboratory in  satisfactory condition.   RESULTS:  Left main coronary artery:  The main coronary artery is free  of significant disease.   Left anterior descending artery:  Please note, the left descending  artery gave rise to a large diagonal branch, a small diagonal branch and  a small septal perforator, and a second moderate-sized diagonal branch.  There was less than 10% stenosis at the stent site in the proximal LAD.  There was 30-40% narrowing just distal to the stent, which was located  just past the takeoff of a diagonal branch.  Following this stenosis,  there was a heavily calcified area, which almost had the appearance of a  stent, although it appears that this is calcified vessel since there is  no indication of a second stent placed.   The circumflex artery:  The circumflex artery gave rise to 2 large  posterolateral  branches.  These vessels were free of significant  disease.   The right coronary artery:  The right coronary artery is a moderately  large vessel gave rise to a conus branch, two ventricle branches,  posterior descending branch, and 4 posterolateral branches.  There is  some irregularity in the midvessel, but no significant obstruction.   The left ventriculogram:  Please note the left ventriculogram performed  in RAO projection shows good wall motion with no areas of hypokinesis.  Estimated ejection fraction of 50%.   HEMODYNAMIC DATA:  The right aortic pressure was 7 mean.  The pulmonary  artery pressure was 25/30 with a mean of 19.  Pulmonary wedge pressure  was 13 mean.  Left ventricular pressure is 129/20.  The aortic pressure  is 129/72 with a mean of 98.  Cardiac output/cardiac index was 5.0/2.5  L/min/m2 by Fick.   CONCLUSION:  Nonobstructive coronary artery disease, status post prior  stenting of left anterior descending with less than 10% stenosis at the  stent site in the proximal left anterior descending, 30-40% narrowing  in  the mid-left anterior descending, no significant obstruction in the  circumflex and right coronary with a normal left ventriculogram  function.   RECOMMENDATIONS:  There is no source of ischemia.  The stent appears  widely patent.  I am not certain regarding the etiology of the patient's  symptoms.  I will discuss with Dr. [name] and arrange follow up with  Dr. [name].      Bruce Elvera Lennox Juanda Chance, MD, [facility]  Electronically Signed     BRB/MEDQ  D:  12/16/2008  T:  12/16/2008  Job:  [id number]   cc:   Madolyn Frieze. [name], MD, [facility], M.D.  Cardiopulmonary Laboratory

## 2011-02-22 NOTE — Assessment & Plan Note (Signed)
[facility]                            CARDIOLOGY OFFICE NOTE   [name], [name]                        MRN:          [id number]  DATE:06/18/2008                            DOB:          Apr 20, 1946    [name] is a very pleasant gentleman who has a history of coronary  artery disease, status post PCI of his LAD in May 2003.  It was a Taxus  stent.  Note, his most recent Myoview was performed on June 19, 2007.  At that time, his ejection fraction was 62%.  There was no  evidence of ischemia.  Since I last saw him, he is doing well from a  symptomatic standpoint.  He has not had chest pain, shortness of breath,  palpitations or syncope.  He does occasionally have some pain in his  ankles and legs after ambulating for 2 miles.   PRESENT MEDICATIONS:  1. Celebrex 200 mg p.o. daily.  2. Plavix 75 mg p.o. daily.  3. Toprol 50 mg p.o. daily.  4. Avalide 150/12.5 mg tablets 1/2 p.o. daily.  5. Vytorin 10/20 daily.  6. Fish oil.  7. Niaspan 1500 mg p.o. daily.  8. Synthroid 112 mcg p.o. daily.  9. Flomax.  10.Fluoxetine 10 mg p.o. daily.  11.Multivitamin.  12.Glucosamine.  13.Coenzyme Q.  14.Aspirin 81 mg p.o. daily.  15.Finasteride 5 mg p.o. daily.   PHYSICAL EXAMINATION:  VITAL SIGNS:  Today shows a blood pressure of  125/78 and his pulse is 62.  He weighs 71 pounds.  HEENT:  Normal.  NECK:  Supple with no bruits.  CHEST:  Clear.  CARDIOVASCULAR:  Regular rate and rhythm.  ABDOMEN:  No tenderness. I cannot appreciate bruits or pulsatile mass.  EXTREMITIES:  No edema.  Note, he has 2+ posterior tibial pulses  bilaterally.   His electrogram shows sinus rhythm at a rate of 61.  No ST changes  noted.   DIAGNOSES:  1. Coronary artery disease - [name] is doing extremely well from a      symptomatic standpoint with no chest pain or shortness of breath.      He will continue with his aspirin, Plavix, statin, beta-blocker and  angiotensin receptor blocker.  His recent Myoview showed no      ischemia or infarction.  2. Hypertension - his blood pressure is adequately controlled on his      present medications.  3. Hyperlipidemia - continue on his statin, fish oil and Niaspan.  Dr.      Juleen Carney is following his lipids and liver.  Note, he had this checked      on May 26, 2008, and his LDL 68 and his liver functions were      normal.   The patient will continue with diet and exercise.  He does not smoke.  I  will see him back in 12 months.     Kevin Frieze [name], MD, [facility]  Electronically Signed    BSC/MedQ  DD: 06/18/2008  DT: 06/19/2008  Job #: [id number]   cc:   [name],  M.D. 

## 2011-02-22 NOTE — Assessment & Plan Note (Signed)
[facility]                            CARDIOLOGY OFFICE NOTE   COSIMO, SCHERTZER                        MRN:          [id number]  DATE:01/02/2009                            DOB:          1946/05/09    Mr. Schimming is a pleasant gentleman who has a history of PCI of his LAD in  May 2003.  I last saw him on December 11, 2008 and he was complaining of  exertional chest pain.  He therefore underwent cardiac catheter  catheterization on December 16, 2008 by Dr. Juanda Chance.  He was found to have a  30-40% LAD just distal to the stent.  There was no other obstructive  disease noted.  The ejection fraction is 50%.  Since then, he denies any  dyspnea, chest pain, palpitations, or syncope.  There is no pedal edema.  He did fall after slipping on a mat in his workshop and bruised his  right ribs.   PRESENT MEDICATIONS:  1. Toprol 50 mg tablets 1 p.o. daily.  2. Avalide 150/12.5 mg one half p.o. daily.  3. Vytorin 10/20 daily.  4. Flomax.  5. Synthroid 112 mcg p.o. daily.  6. Niaspan 1500 mg p.o. daily.  7. Plavix 75 mg p.o. daily.  8. Proscar.  9. Prozac.  10.Celebrex.  11.Multivitamin.  12.Fish oil.  13.Glucosamine.  14.Aspirin 81 mg p.o. daily.  15.Coenzyme Q.  16.Policosanol.  17.Flurbiprofen.  18.Metoclopramide.  19.Nitroglycerin as needed.   PHYSICAL EXAMINATION:  VITAL SIGNS:  Blood pressure of 112/60.  His  pulse is 70.  HEENT:  Normal.  NECK:  Supple.  CHEST:  Clear.  CARDIOVASCULAR:  Regular rhythm.  ABDOMEN:  No tenderness.  He does have some ecchymosis over his right  ribs from his fall.  His right groin shows no hematoma and no bruit.  EXTREMITIES:  No edema.   DIAGNOSES:  1. Coronary artery disease - The patient's catheterization revealed      nonobstructive disease.  He will continue on his aspirin, Plavix,      beta-blocker, and statin.  He also continue on his ARB.  2. Hyperlipidemia - he will continue on statin and Dr. Juleen China is  following this.  3. Hypertension - his blood pressure was controlled on his present      medication.   We will see him back in 6 months.     Madolyn Frieze [name], MD, [facility]  Electronically Signed    BSC/MedQ  DD: 01/02/2009  DT: 01/02/2009  Job #: [id number]   cc:   Brooke Bonito, M.D.

## 2011-02-25 NOTE — Op Note (Signed)
NAMEASHRAF, MESTA                 ACCOUNT NO.:  [id number]   MEDICAL RECORD NO.:  [id number]          PATIENT TYPE:  AMB   LOCATION:  DAY                          FACILITY:  [facility]   PHYSICIAN:  Angelia Mould. Derrell Lolling, M.D.DATE OF BIRTH:  1946-05-14   DATE OF PROCEDURE:  07/25/2006  DATE OF DISCHARGE:                                 OPERATIVE REPORT   PREOPERATIVE DIAGNOSIS:  Right inguinal hernia.   POSTOPERATIVE DIAGNOSIS:  Right inguinal hernia.   OPERATION PERFORMED:  Repair right inguinal hernia with mesh Armanda Heritage  repair).   SURGEON:  Angelia Mould. Derrell Lolling, M.D.   OPERATIVE INDICATIONS:  This is a 65 year old white man who has had some  discomfort and a bulge in his right groin for over a year.  On exam, he has  a reducible right inguinal hernia.  Because of persistent discomfort, he  wants to have this repaired.  He had a coronary artery stent placed and is  on aspirin and Plavix.  We were advised not to discontinue that.  The  patient has been advised of this slightly increased risk of bleeding.   OPERATIVE TECHNIQUE:  The patient was brought to the operating room.  General LMA anesthetic was induced.  Intravenous antibiotics were given.  The patient was identified as the correct patient and correct procedure.  The right groin was prepped and draped in a sterile fashion.  0.5% Marcaine  with epinephrine was used as local infiltration anesthetic.  A transverse  incision was made in the right groin overlying the inguinal canal.  Dissection was carried down through the subcutaneous tissue exposing the  aponeurosis of the external oblique.  The external oblique was incised in  the direction of its fibers opening up the external inguinal ring.  The  external oblique was dissected away from the underlying tissues and self-  retaining retractors were placed.  The cord structures were mobilized and  encircled with a Penrose drain.  One sensory nerve was traced back to its  emergence from the muscles laterally, clamped, divided, debrided and ligated  with 2-0 silk ties.  Cremasteric muscle fibers were dissected off of the  cord structures and I found a moderate sized indirect hernia sac.  The  indirect hernia sac was dissected away from the cord structures all the way  back to the level of the internal ring.  The sac appeared empty.  The sac  was opened and inspected.  There was a little bit of peritoneal fluid  present but no intra-abdominal contents.  Palpation revealed no adhesions or  mass within the abdomen.  The iliac artery felt soft with good pulsation and  no calcifications.  The indirect sac was then twisted and then suture  ligated at the level of the internal ring with a suture ligature of 2-0  silk.  The redundant sac was excised and discarded.  He really did not have  a direct hernia.  The floor of the inguinal canal was repaired and  reinforced with a 3 inch x 6 inch piece of polypropylene mesh.  The mesh was  trimmed  at the corners so as to fit the wound.  The mesh was sutured in  place with running sutures and interrupted sutures of 2-0 Prolene.  The mesh  was sutured so as to generously overlap the fascia at the pubic tubercle,  along the inguinal ligament inferiorly.  I then placed some interrupted  mattress sutures medially and superior medially.  A running suture of 2-0  Prolene was placed superiorly and superolaterally.  The mesh was incised  laterally so as to wrap around the cord structures and the internal ring.  The tails of the mesh were overlapped laterally and the suture lines  completed.  This provided a very secure repair both medial and lateral to  the cord structures but allowed adequate opening for the cord structures.  There was irrigated with saline.  We had excellent hemostasis.  The external  oblique was closed with a running suture of 2-0 Vicryl placing the cord  structures deep to the external oblique.  Scarpa's fascia  was closed with 3-  0 Vicryl suture.  The skin was closed with a running subcuticular suture  of 4-0 Monocryl and Steri-Strips.  Clean bandages were placed and the  patient taken to the recovery room in stable condition.  Estimated blood  loss was about 10 mL.  Complications none. Sponge, needle and instrument  counts were correct.      Angelia Mould. Derrell Lolling, M.D.  Electronically Signed     HMI/MEDQ  D:  07/25/2006  T:  07/25/2006  Job:  [id number]   cc:   Loreen Freud, M.D.  [address] [place]  [place] [address]   Salvadore Farber, MD  [address]  [address]  [place]  [place] [address]

## 2011-02-25 NOTE — Letter (Signed)
July 03, 2006     Angelia Mould. Derrell Lolling, M.D.  [address]  [place], [place] [address]   RE:  HAIG, GERARDO  MRN:  [id number]  /  DOB:  October 03, 1946   Dear Dr. Derrell Lolling:   Thank you for referring Mr. Mainwaring for preoperative cardiac consultation.  As  you know, Mr. Askren is a 65 year old gentleman for whom repair of a right  inguinal hernia is contemplated.  He has three drug eluting stents in his  left anterior descending artery.  He has not had any recent angina or heart  failure symptoms.  From that perspective, he is at low risk of suggested  surgery.   However, I feel strongly that he should be maintained on both aspirin and  Plavix throughout the perioperative period.  Goal of this is to minimize the  risk of late stent thrombosis.  Please feel free to call me with any  questions.  I will attempt to contact you today in this regard.    Sincerely,      Salvadore Farber, MD   WED/MedQ  DD:  07/03/2006  DT:  07/05/2006  Job #:  [id number]

## 2011-02-25 NOTE — Assessment & Plan Note (Signed)
[facility]                              CARDIOLOGY OFFICE NOTE   [name], [name]                        MRN:          [id number]  DATE:07/03/2006                            DOB:          1946-04-26    REFERRING PHYSICIAN:  Angelia Mould. Derrell Lolling, M.D.   REASON FOR CONSULTATION:  Preoperative cardiac evaluation.   HISTORY OF PRESENT ILLNESS:  Mr. Lipe is a 65 year old gentleman who is  status post placement of a TAXUS drug-eluting stent to the mid LAD in 2003  (2.25 x 16 mm as part of the TAXUS V trial).  Since then, he has done well.  One month ago, he had an episode of burning substernal chest discomfort  while dancing with his wife.  He dances frequently and has had multiple  vigorous dances since then without a recurrence of his symptoms.  He had no  radiation of the discomfort and no associated diaphoresis or nausea.  He has  felt completely well since then.  He has no exertional dyspnea, orthopnea,  PND, edema, claudication, palpitations, syncope, or presyncope.   PAST MEDICAL HISTORY:  1. Coronary artery disease status post placement of drug-eluting stent in      the LAD in 2003, EF 65% then.  2. Status post right thyroid lobectomy for follicular adenoma.  3. Right inguinal hernia.  4. Removal of cyst from left neck.  5. Colon polyps.  6. Internal hemorrhoids.  7. BPH.   ALLERGIES:  1. CODEINE.  2. VICODIN.  3. CRESTOR.   CURRENT MEDICATIONS:  1. Aspirin 81 mg per day.  2. Plavix 75 mg per day.  3. Toprol XL 50 mg per day.  4. Avalide 150/12.5 one-half tablet once per day.  5. Vitorin 10/20 one nightly.  6. Fish oil 1000 mg per day.  7. Niaspan 1500 mg per day.  8. Synthroid 112 mcg per day.  9. Flomax 0.4 mg per day.  10.Fluoxetine 10 mg per day.  11.Multivitamins one per day.  12.Glucosamine twice per day.  13.Saw Palmetto twice per day.  14.Coenzyme Q10 100 mg per day.  15.He has some nitroglycerin which he has not  used.   SOCIAL HISTORY:  The patient is a nonsmoker and married.  He is retired and  enjoying retirement.   FAMILY HISTORY:  Noncontributory.   REVIEW OF SYSTEMS:  Negative in detail except as above.   PHYSICAL EXAMINATION:  GENERAL:  Well-appearing and in no distress.  VITAL SIGNS:  Heart rate 65, blood pressure 130/70, weight 174 pounds.  NECK:  No jugular venous distention, no thyromegaly.  LUNGS: Clear to auscultation.  CARDIOVASCULAR:  He has a nondisplaced point of maximal cardiac impulse.  There is a regular rate and rhythm without murmur, rub, or gallop.  ABDOMEN:  Soft, nondistended, nontender.  There is no hepatosplenomegaly.  Bowel sounds are normal.  The hernia is not palpable and appears to be  appropriately reduced at present.  There is no pulsatile midline mass.  EXTREMITIES:  Warm without clubbing, cyanosis, edema, or ulceration.  PULSES:  Carotid pulses 2+ bilaterally  without bruits.  Femoral pulses 2+  bilaterally.  NEUROLOGIC:  Alert and oriented x3 with normal neurologic exam.   Electrocardiogram demonstrates normal sinus rhythm and is a normal EKG.   IMPRESSION AND RECOMMENDATIONS:  The patient is having no angina.  Episode  while dancing a month ago sounds more like reflux than angina.  He has had  no recurrence.  I think that no further testing prior to inguinal hernia  repair is indicated.  He is at low risk for cardiac complications.   I am concerned with the risk of late stent thrombosis in the perioperative  period.  As I discussed with Dr. Derrell Lolling over the phone today, I have  recommended continuing both the aspirin and the Plavix throughout the  perioperative period.                                 Salvadore Farber, MD    WED/MedQ  DD:  07/03/2006  DT:  07/05/2006  Job #:  [id number]   cc:   Angelia Mould. Derrell Lolling, M.D.

## 2011-02-28 ENCOUNTER — Other Ambulatory Visit: Payer: Self-pay | Admitting: Cardiology

## 2011-07-12 ENCOUNTER — Encounter: Payer: Self-pay | Admitting: Cardiology

## 2011-07-12 ENCOUNTER — Encounter: Payer: Self-pay | Admitting: *Deleted

## 2011-07-12 ENCOUNTER — Ambulatory Visit (INDEPENDENT_AMBULATORY_CARE_PROVIDER_SITE_OTHER): Payer: Medicare Other | Admitting: Cardiology

## 2011-07-12 VITALS — BP 108/63 | HR 70 | Ht 70.0 in | Wt 173.0 lb

## 2011-07-12 DIAGNOSIS — I10 Essential (primary) hypertension: Secondary | ICD-10-CM

## 2011-07-12 DIAGNOSIS — I2510 Atherosclerotic heart disease of native coronary artery without angina pectoris: Secondary | ICD-10-CM

## 2011-07-12 DIAGNOSIS — E785 Hyperlipidemia, unspecified: Secondary | ICD-10-CM

## 2011-07-12 NOTE — Assessment & Plan Note (Signed)
Blood pressure controlled. Continue present medications. Potassium and renal function monitored by primary care. 

## 2011-07-12 NOTE — Progress Notes (Signed)
HPI:[name] is a pleasant gentleman who has a history of PCI of his LAD in May 2003. His last cardiac catheterization was performed on December 16, 2008 by Dr. Juanda Chance for exertional chest pain.  He was found to have a 30-40% LAD just distal to the stent.  There was no other obstructive disease noted.  The ejection fraction is 50%. He has been treated medically. Abdominal ultrasound in Oct 2011 showed no aneurysm. I last saw him in October of 2011. Since then he has mild chest tightness with extreme activities which he has had for years. It is relieved with rest. No significant dyspnea, palpitations or syncope.   Current Outpatient Prescriptions  Medication Sig Dispense Refill  . aspirin 81 MG tablet Take 81 mg by mouth daily.        . Biotin 1000 MCG tablet Take 1,000 mcg by mouth 2 (two) times daily.        . Cinnamon 500 MG capsule Take 500 mg by mouth daily.        . Coenzyme Q10 (COQ-10) 100 MG capsule Take 100 mg by mouth daily.        . Ginger, Zingiber officinalis, (GINGER ROOT) 550 MG CAPS Take 1 capsule by mouth daily.        Marland Kitchen losartan-hydrochlorothiazide (HYZAAR) 50-12.5 MG per tablet TAKE ONE TABLET BY MOUTH ONE TIME DAILY  30 tablet  11  . metoprolol (TOPROL-XL) 50 MG 24 hr tablet TAKE ONE TABLET BY MOUTH ONE TIME DAILY  30 tablet  10  . multivitamin (THERAGRAN) per tablet Take 1 tablet by mouth daily.        . NON FORMULARY Take 1 capsule by mouth daily. ( Tumeric 500 mg )       . Omega-3 Fatty Acids (FISH OIL) 1200 MG CAPS Take 2 capsules by mouth 2 (two) times daily.        Marland Kitchen POLICOSANOL PO Take 10 mg by mouth daily.           Past Medical History  Diagnosis Date  . CAD (coronary artery disease)   . Diabetes mellitus   . Hypertension   . Hyperlipidemia   . Hypothyroid   . Arthritis   . Depression     No past surgical history on file.  History   Social History  . Marital Status: Married    Spouse Name: N/A    Number of Children: N/A  . Years of Education: N/A    Occupational History  . Not on file.   Social History Main Topics  . Smoking status: Former Smoker    Types: Cigarettes    Quit date: 10/10/1965  . Smokeless tobacco: Not on file  . Alcohol Use: No  . Drug Use: No  . Sexually Active: Not on file   Other Topics Concern  . Not on file   Social History Narrative  . No narrative on file    ROS: no fevers or chills, productive cough, hemoptysis, dysphasia, odynophagia, melena, hematochezia, dysuria, hematuria, rash, seizure activity, orthopnea, PND, pedal edema, claudication. Remaining systems are negative.  Physical Exam: Well-developed well-nourished in no acute distress.  Skin is warm and dry.  HEENT is normal.  Neck is supple. No thyromegaly.  Chest is clear to auscultation with normal expansion.  Cardiovascular exam is regular rate and rhythm.  Abdominal exam nontender or distended. No masses palpated. Extremities show no edema. neuro grossly intact  ECG normal sinus rhythm at a rate of 70. No ST  changes.

## 2011-07-12 NOTE — Assessment & Plan Note (Signed)
Continue statin. Lipids and liver monitor her primary care.

## 2011-07-12 NOTE — Patient Instructions (Signed)
Your physician wants you to follow-up in: one year You will receive a reminder letter in the mail two months in advance. If you don't receive a letter, please call our office to schedule the follow-up appointment.  

## 2011-07-12 NOTE — Assessment & Plan Note (Addendum)
Continue aspirin and statin.plan Myoview when he returns in one year. Note he has mild tightness in his chest with vigorous activities but this is chronic and unchanged.

## 2011-07-26 ENCOUNTER — Ambulatory Visit (INDEPENDENT_AMBULATORY_CARE_PROVIDER_SITE_OTHER): Payer: Medicare Other | Admitting: Family Medicine

## 2011-07-26 ENCOUNTER — Encounter: Payer: Self-pay | Admitting: Family Medicine

## 2011-07-26 VITALS — BP 118/72 | HR 71 | Temp 98.0°F | Ht 70.0 in | Wt 177.2 lb

## 2011-07-26 DIAGNOSIS — Z0000 Encounter for general adult medical examination without abnormal findings: Secondary | ICD-10-CM

## 2011-07-26 DIAGNOSIS — E119 Type 2 diabetes mellitus without complications: Secondary | ICD-10-CM

## 2011-07-26 DIAGNOSIS — I10 Essential (primary) hypertension: Secondary | ICD-10-CM

## 2011-07-26 DIAGNOSIS — M129 Arthropathy, unspecified: Secondary | ICD-10-CM

## 2011-07-26 DIAGNOSIS — E785 Hyperlipidemia, unspecified: Secondary | ICD-10-CM

## 2011-07-26 DIAGNOSIS — Z23 Encounter for immunization: Secondary | ICD-10-CM

## 2011-07-26 LAB — CBC WITH DIFFERENTIAL/PLATELET
Basophils Relative: 0.5 % (ref 0.0–3.0)
Eosinophils Relative: 8.3 % — ABNORMAL HIGH (ref 0.0–5.0)
HCT: 46.8 % (ref 39.0–52.0)
Lymphs Abs: 1.3 10*3/uL (ref 0.7–4.0)
MCV: 97.5 fl (ref 78.0–100.0)
Monocytes Absolute: 0.5 10*3/uL (ref 0.1–1.0)
Monocytes Relative: 8.8 % (ref 3.0–12.0)
Platelets: 128 10*3/uL — ABNORMAL LOW (ref 150.0–400.0)
RBC: 4.8 Mil/uL (ref 4.22–5.81)
WBC: 5.2 10*3/uL (ref 4.5–10.5)

## 2011-07-26 LAB — HEPATIC FUNCTION PANEL
ALT: 23 U/L (ref 0–53)
AST: 28 U/L (ref 0–37)
Bilirubin, Direct: 0.1 mg/dL (ref 0.0–0.3)
Total Bilirubin: 0.8 mg/dL (ref 0.3–1.2)
Total Protein: 6.6 g/dL (ref 6.0–8.3)

## 2011-07-26 LAB — BASIC METABOLIC PANEL
BUN: 25 mg/dL — ABNORMAL HIGH (ref 6–23)
Chloride: 106 mEq/L (ref 96–112)
GFR: 74.76 mL/min (ref >60.00)
Potassium: 4 mEq/L (ref 3.5–5.1)
Sodium: 141 mEq/L (ref 135–145)

## 2011-07-26 LAB — LIPID PANEL
Cholesterol: 104 mg/dL (ref 0–200)
LDL Cholesterol: 38 mg/dL (ref 0–99)
Total CHOL/HDL Ratio: 2

## 2011-07-26 LAB — MICROALBUMIN / CREATININE URINE RATIO
Creatinine,U: 162.1 mg/dL
Microalb Creat Ratio: 0.5 mg/g (ref 0.0–30.0)
Microalb, Ur: 0.8 mg/dL (ref 0.0–1.9)

## 2011-07-26 NOTE — Patient Instructions (Signed)
Preventative Care for Adults, Male   MAINTAIN REGULAR HEALTH EXAMS   A routine yearly physical is a good way to check in with your primary care provider about your health and preventive screening. It is also an opportunity to share updates about your health and any concerns you have and receive a thorough all-over exam.   If you smoke or chew tobacco, find out from your caregiver how to quit. It can literally save your life, no matter how long you have been a tobacco user. If you do not use tobacco, never begin.   Maintain a healthy diet and normal weight. Increased weight leads to problems with blood pressure and diabetes. Decrease saturated fat in the diet and increase regular exercise. Get information about proper diet from your caregiver if necessary. Eat a variety of foods, including fruit, vegetables, animal or vegetable protein, such as meat, fish, chicken, and eggs, or beans, lentils, tofu, and grains, such as rice.   High blood pressure causes heart and blood vessel problems. Fat leaves deposits in your arteries that can block them. This causes heart disease and vessel disease elsewhere in your body. Check your blood pressure regularly and keep it within normal limits. Men over age 50 or those who have a family history of high blood pressure should have it checked at least every year.   Aerobic exercise helps maintain good heart health. 30 minutes of moderate-intensity exercise is recommended. For example, a brisk walk that increases your heart rate and breathing. This walk should be done on most days of the week. Persistent high blood pressure should be treated with medicine if weight loss and exercise do not work.   For many men aged 20 and older, having a cholesterol test of the blood every 5 years is recommended. If your cholesterol is found to be borderline high, or if you have heart disease or certain other medical conditions, then you may need to have it monitored more frequently.   Avoid smoking,  drinking alcohol in excess (more than two drinks per day) or use of street drugs. Do not share needles with anyone. Ask for professional help if you need assistance or instructions on stopping the use of alcohol, cigarettes, and/or drugs.   Maintain normal blood lipids and cholesterol by minimizing your intake of saturated fat. Eat a well rounded diet otherwise, with plenty of fruit and vegetables. The National Institutes of Health encourage men to eat 5-9 servings of fruit and vegetables each day. Your caregiver can help you keep your risk of heart disease or stroke at a lower level.   Ask your caregiver if you are in need of earlier testing because of: a strong family history of heart disease, or you have signs of elevated testosterone (male sex hormone) levels. This can predispose you to earlier heart disease. Ask if you should have a stress test if your history suggests this. A stress test is a test done on a treadmill that looks for heart disease. This test can find disease prior to there being a problem.   Diabetes screening assesses your blood sugar level after a fasting once every 3 years after age 67 if previous tests were normal.   Most routine colon cancer screening begins at the age of 67. On a yearly basis, doctors may provide special easy to use take-home tests to check for hidden blood in the stool. Sigmoidoscopy or colonoscopy can detect the earliest forms of colon cancer and is life saving. These test use a   small camera at the end of a tube to directly examine the colon. Speak to your caregiver about this at age 67, when routine screening begins (and is repeated every 5 years unless early forms of pre-cancerous polyps or small growths are found).   At the age of 67 men usually start screening for prostate cancer every year. Screening may begin at a younger age for those with higher risk. Those at higher risk include African-Americans or having a family history of prostate cancer. There are two types  of tests for prostate cancer:   Prostate-specific antigen (PSA) testing. Recent studies raise questions about prostate cancer using PSA and you should discuss this with your caregiver.   Digital rectal exam (in which your doctor’s lubricated and gloved finger feels for enlargement of the prostate through the anus).   Practice safe sex. Use condoms. Condoms are used for birth control and to help reduce the spread of sexually transmitted infections (or STIs). Unsafe sex is having an unprotected physical relationship with someone who is bisexual, homosexual, uses intravenous street drugs, or going with someone who has sexual relations with high-risk groups. Practicing safe sex helps you avoid getting an STI. Some of the STIs are gonorrhea (the clap), chlamydia, syphilis, trichimonas, herpes, HPV (human papiloma virus) and HIV (human immunodeficiency virus) which causes AIDS. The herpes, HIV and HPV are viral illnesses that have no cure. These can result in disability, cancer and death.   It is not safe for someone who has AIDS or is HIV positive to have unprotected sex with someone else who is positive. The reason for this is the fact that there are many different strains of HIV. If you have a strain that is readily treated with medications and then suddenly introduce a strain from a partner that has no further treatment options, you may suddenly have a strain of HIV that is untreatable. Even if you are both positive for HIV, it is still necessary to practice safe sex.   Use sunscreen with a SPF (or skin protection factor) of 15 or greater. Apply sunscreen liberally and repeatedly throughout the day. Being outside in the sun when your shadow caused by the sun is shorter than you are, means you are being exposed to sun at greater intensity. Lighter skinned people are at a greater risk of skin cancer. Don’t forget to also wear sunglasses in order to protect your eyes from too much damaging sunlight. Damaging sunlight can  accelerate cataract formation.   Once a month do a whole body skin exam or review, using a mirror to look at your back. Notify your caregivers of changes in moles, especially if there are changes in shapes, colors, a size larger than a pencil eraser, an irregular border, or development of new moles.   Keep carbon monoxide and smoke detectors in your home functioning at all times. Change the batteries every 6 months or use a model that plugs into the wall.   Do a monthly exam of your testicles. Gently roll each testicle between your thumb and fingers, feeling for any abnormal lumps. The best time to do this is after a hot shower or bath when the tissues are looser. Notify your caregivers of any lumps, tenderness or changes in size or shape immediately.   Stay up to date with your tetanus shots and other required immunizations. You should have a booster for tetanus every 10 years. Be sure to get your flu shot every year, since 5%-20% of the [place] population   comes down with the flu. The flu vaccine changes each year, so being vaccinated once is not enough. Get your shot in the fall, before the flu season peaks. The table below lists important vaccines to get. Other vaccines to consider include:   Hepatitis A virus (to prevent a form of infection of the liver by a virus acquired from food), Varicella Zoster (a virus that causes shingles).   Meningococcal (against bacteria which cause a form of meningitis).   Brush your teeth twice a day with fluoride toothpaste, and floss once a day. Good oral hygiene prevents tooth decay and gum disease. The problems can be painful, unattractive, and can cause other health problems. Visit your dentist for a routine oral and dental check up and preventive care every 6-12 months.   The Body Mass Index or BMI is a way of measuring how much of your body is fat. Having a BMI above 27 increases the risk of heart disease, diabetes, hypertension, stroke and other problems related to obesity.  Your caregiver can help determine your BMI and based on it develop an exercise and dietary program to help you achieve or maintain this important measurement at a healthful level.   Wear seat belts whenever in a vehicle, whether a passenger or driver, and even for very short drives of a few minutes.   If you bicycle, wear a helmet at all times.   Below is a summary of the most important preventative healthcare services that adult males should seek on a regular basis throughout their lives:   Preventative Care for Adult Males    Preventative Service  Ages 19-39  Ages 40-64  Ages 65 and over    Schedule of medical visits  Every 5 years  Every 5 years     Schedule of dental visits  Every 6-12 months  Every 6-12 months     Health risk assessment and lifestyle counseling  Every 3-5 years  Every 3-5 years  Every 3-5 years    Blood pressure check**  Every 2 years  Every 2 years  Every 2 years    Total cholesterol check including HDL**  Every 5 years beginning at age 35  Every 5 years  Every 5 years through age 75, then optional.    Flexible sigmoidoscopy or colonoscopy**   Every 5 years beginning at age 50  Every 5 years through age 80, then optional.    Prostate screening   Every year beginning at age 50  Every Year    Testicular exam  Monthly  Monthly  Monthly    FOBT (fecal occult blood test)   Every year beginning at age 50  Every year until 80, then optional.    Skin self-exam  Monthly  Monthly  Monthly    Tetanus-diphtheria (Td) immunization  Every 10 years  Every 10 years  Every 10 years    Influenza immunization**  Every year  Every year  Every year    Pneumococcal immunization**  Optional  Optional  Every 5 years    Hepatitis B immunization**  Series of 3 immunizations   (if not done previously, usually given at 0, 1 to 2, and 4 to 6 months)  Check with your caregiver if vaccination not previously given.  Check with your caregiver if vaccination not previously given.    **Family history and personal history of  risk and conditions may change your physician's recommendations.    Document Released: 11/22/2001 Document Re-Released: 12/21/2009   [employer]® Patient Information ©  2011 [employer].

## 2011-07-26 NOTE — Progress Notes (Signed)
Subjective:    [name] is a 65 y.o. male who presents for Medicare Annual/Subsequent preventive examination.   Preventive Screening-Counseling & Management  Tobacco History  Smoking status  . Former Smoker  . Types: Cigarettes  . Quit date: 10/10/1965  Smokeless tobacco  . Not on file    Problems Prior to Visit 1. 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 HYPERTHYROIDISM  . HYPOTHYROIDISM  . DIABETES-TYPE 2  . Other and Unspecified Hyperlipidemia  . DEPRESSION  . HYPERTENSION, BENIGN  . HYPERTENSION  . ANGINA PECTORIS  . CORONARY ARTERY DISEASE  . CAD, NATIVE VESSEL  . HEMORRHOIDS-INTERNAL  . URI  . RECTAL BLEEDING  . BLOOD IN STOOL  . OSTEOARTHRITIS  . HEADACHE  . ABDOMINAL BRUIT  . BLOOD IN STOOL, OCCULT  . PERSONAL HISTORY OF COLONIC POLYPS  . INFLUENZA WITH OTHER MANIFESTATIONS  . Hyperlipidemia    Medications Prior to Visit Current Outpatient Prescriptions on File Prior to Visit  Medication Sig Dispense Refill  . aspirin 81 MG tablet Take 81 mg by mouth daily.        . Biotin 1000 MCG tablet Take 1,000 mcg by mouth 2 (two) times daily.        . celecoxib (CELEBREX) 200 MG capsule Take 200 mg by mouth 2 (two) times daily.        . Cinnamon 500 MG capsule Take 500 mg by mouth daily.        . Coenzyme Q10 (COQ-10) 100 MG capsule Take 100 mg by mouth daily.        . Diclofenac Sodium (SOLARAZE) 3 % GEL Place 1 application onto the skin as directed.        . ezetimibe-simvastatin (VYTORIN) 10-20 MG per tablet Take 1 tablet by mouth at bedtime.        . Ginger, Zingiber officinalis, (GINGER ROOT) 550 MG CAPS Take 1 capsule by mouth daily.        Marland Kitchen levothyroxine (SYNTHROID, LEVOTHROID) 112 MCG tablet Take 112 mcg by mouth daily.        Marland Kitchen losartan-hydrochlorothiazide (HYZAAR) 50-12.5 MG per tablet TAKE ONE TABLET BY MOUTH ONE TIME DAILY  30 tablet  11  . metFORMIN (GLUMETZA) 500 MG (MOD) 24 hr tablet Take  500 mg by mouth 2 (two) times daily with a meal.        . metoprolol (TOPROL-XL) 50 MG 24 hr tablet TAKE ONE TABLET BY MOUTH ONE TIME DAILY  30 tablet  10  . multivitamin (THERAGRAN) per tablet Take 1 tablet by mouth daily.        . niacin (NIASPAN) 750 MG CR tablet Take 750 mg by mouth at bedtime.        . nitroGLYCERIN (NITROSTAT) 0.4 MG SL tablet Place 0.4 mg under the tongue every 5 (five) minutes as needed.        . NON FORMULARY Take 1 capsule by mouth daily. ( Tumeric 500 mg )       . Omega-3 Fatty Acids (FISH OIL) 1200 MG CAPS Take 2 capsules by mouth 2 (two) times daily.        Marland Kitchen POLICOSANOL PO Take 10 mg by mouth daily.        . Tamsulosin HCl (FLOMAX) 0.4 MG CAPS Take 0.4 mg by mouth daily.          Current Medications (verified) Current Outpatient Prescriptions  Medication Sig Dispense Refill  . aspirin 81  MG tablet Take 81 mg by mouth daily.        . Biotin 1000 MCG tablet Take 1,000 mcg by mouth 2 (two) times daily.        . celecoxib (CELEBREX) 200 MG capsule Take 200 mg by mouth 2 (two) times daily.        . Cinnamon 500 MG capsule Take 500 mg by mouth daily.        . Coenzyme Q10 (COQ-10) 100 MG capsule Take 100 mg by mouth daily.        . Diclofenac Sodium (SOLARAZE) 3 % GEL Place 1 application onto the skin as directed.        . ezetimibe-simvastatin (VYTORIN) 10-20 MG per tablet Take 1 tablet by mouth at bedtime.        . Ginger, Zingiber officinalis, (GINGER ROOT) 550 MG CAPS Take 1 capsule by mouth daily.        Marland Kitchen levothyroxine (SYNTHROID, LEVOTHROID) 112 MCG tablet Take 112 mcg by mouth daily.        Marland Kitchen losartan-hydrochlorothiazide (HYZAAR) 50-12.5 MG per tablet TAKE ONE TABLET BY MOUTH ONE TIME DAILY  30 tablet  11  . metFORMIN (GLUMETZA) 500 MG (MOD) 24 hr tablet Take 500 mg by mouth 2 (two) times daily with a meal.        . metoprolol (TOPROL-XL) 50 MG 24 hr tablet TAKE ONE TABLET BY MOUTH ONE TIME DAILY  30 tablet  10  . multivitamin (THERAGRAN) per tablet Take 1  tablet by mouth daily.        . niacin (NIASPAN) 750 MG CR tablet Take 750 mg by mouth at bedtime.        . nitroGLYCERIN (NITROSTAT) 0.4 MG SL tablet Place 0.4 mg under the tongue every 5 (five) minutes as needed.        . NON FORMULARY Take 1 capsule by mouth daily. ( Tumeric 500 mg )       . Omega-3 Fatty Acids (FISH OIL) 1200 MG CAPS Take 2 capsules by mouth 2 (two) times daily.        Marland Kitchen POLICOSANOL PO Take 10 mg by mouth daily.        . Tamsulosin HCl (FLOMAX) 0.4 MG CAPS Take 0.4 mg by mouth daily.           Allergies (verified) Codeine; Crestor; Hydrocodone-acetaminophen; and Rosuvastatin   PAST HISTORY  Family History Family History  Problem Relation Age of Onset  . Mental illness Mother     alzheimers  . Heart disease Mother   . Heart disease Father     triple by pass  . Hypertension Brother   . Hypertension Brother   . Hypertension Brother     Social History History  Substance Use Topics  . Smoking status: Former Smoker    Types: Cigarettes    Quit date: 10/10/1965  . Smokeless tobacco: Not on file  . Alcohol Use: No    Are there smokers in your home (other than you)?  No  Risk Factors Current exercise habits: Gym/ health club routine includes walk the dog .  Dietary issues discussed: NA   Cardiac risk factors: advanced age (older than 50 for men, 58 for women), diabetes mellitus, dyslipidemia, hypertension and male gender.  Depression Screen (Note: if answer to either of the following is "Yes", a more complete depression screening is indicated)   Q1: Over the past two weeks, have you felt down, depressed or hopeless? No  Q2:  Over the past two weeks, have you felt little interest or pleasure in doing things? No  Have you lost interest or pleasure in daily life? No  Do you often feel hopeless? No  Do you cry easily over simple problems? No  Activities of Daily Living In your present state of health, do you have any difficulty performing the following  activities?:  Driving? No Managing money?  No Feeding yourself? No Getting from bed to chair? No Climbing a flight of stairs? Yes Preparing food and eating?: No Bathing or showering? No Getting dressed: No Getting to the toilet? No Using the toilet:No Moving around from place to place: No In the past year have you fallen or had a near fall?:No   Are you sexually active?  Yes  Do you have more than one partner?  No  Hearing Difficulties: No Do you often ask people to speak up or repeat themselves? No Do you experience ringing or noises in your ears? No Do you have difficulty understanding soft or whispered voices? No   Do you feel that you have a problem with memory? No  Do you often misplace items? No  Do you feel safe at home?  No  Cognitive Testing  Alert? Yes  Normal Appearance?yes Oriented to person? Yes  Place? Yes   Time? Yes  Recall of three objects?  Yes  Can perform simple calculations? Yes  Displays appropriate judgment?Yes  Can read the correct time from a watch face?Yes   Advanced Directives have been discussed with the patient? Yes   List the Names of Other Physician/Practitioners you currently use: 1.  Cardio-- Crenshaw 2 urologist-- Laverle Patter 3 dentist--redding 4 optho--Dr McCuen 5.  Derm--Lupton 6. endo--Kohut 7.  GI--Stark 8.  Surgery--Ingram---- prn 9. Ortho-- Caffrey 10.  Chiropractor--Tury Indicate any recent Medical Services you may have received from other than [facility] providers in the past year (date may be approximate).  Immunization History  Administered Date(s) Administered  . Influenza Split 07/26/2011  . Influenza Whole 08/30/2007, 07/08/2009, 07/06/2010  . Pneumococcal Polysaccharide 04/23/2003, 07/08/2009  . Td 02/19/2003  . Zoster 02/21/2007    Screening Tests Health Maintenance  Topic Date Due  . Colonoscopy  01/17/1994  . Influenza Vaccine  07/11/2011  . Tetanus/tdap  02/18/2013  . Pneumococcal Polysaccharide Vaccine Age 77 And  Over  Completed  . Zostavax  Completed    All answers were reviewed with the patient and necessary referrals were made:  Loreen Freud, DO   07/26/2011   History reviewed: allergies, current medications, past family history, past medical history, past social history, past surgical history and problem list  Review of Systems  Review of Systems  Constitutional: Negative for activity change, appetite change and fatigue.  HENT: Negative for hearing loss, congestion, tinnitus and ear discharge.  dentist q53m Eyes: Negative for visual disturbance (see optho q1y -- vision corrected to 20/20 with glasses).  Respiratory: Negative for cough, chest tightness and shortness of breath.   Cardiovascular: Negative for chest pain, palpitations and leg swelling.  Gastrointestinal: Negative for abdominal pain, diarrhea, constipation and abdominal distention.  Genitourinary: Negative for urgency, frequency, decreased urine volume and difficulty urinating.  Musculoskeletal: Negative for back pain, arthralgias and gait problem.  Skin: Negative for color change, pallor and rash.  Neurological: Negative for dizziness, light-headedness, numbness and headaches.  Hematological: Negative for adenopathy. Does not bruise/bleed easily.  Psychiatric/Behavioral: Negative for suicidal ideas, confusion, sleep disturbance, self-injury, dysphoric mood, decreased concentration and agitation.  Pt is able to  read and write and can do all ADLs No risk for falling No abuse/ violence in home      Objective:     Vision by Snellen chart: optho Blood pressure 118/72, pulse 71, temperature 98 F (36.7 C), temperature source Oral, weight 177 lb 3.2 oz (80.377 kg), SpO2 96.00%. There is no height on file to calculate BMI.  BP 118/72  Pulse 71  Temp(Src) 98 F (36.7 C) (Oral)  Wt 177 lb 3.2 oz (80.377 kg)  SpO2 96% General appearance: alert, cooperative, appears stated age and no distress Head: Normocephalic, without  obvious abnormality, atraumatic Eyes: conjunctivae/corneas clear. PERRL, EOM's intact. Fundi benign. Ears: normal TM's and external ear canals both ears Nose: Nares normal. Septum midline. Mucosa normal. No drainage or sinus tenderness. Throat: lips, mucosa, and tongue normal; teeth and gums normal Neck: no adenopathy, no carotid bruit, no JVD, supple, symmetrical, trachea midline and thyroid not enlarged, symmetric, no tenderness/mass/nodules Back: symmetric, no curvature. ROM normal. No CVA tenderness. Lungs: clear to auscultation bilaterally Chest wall: no tenderness Heart: regular rate and rhythm, S1, S2 normal, no murmur, click, rub or gallop Abdomen: soft, non-tender; bowel sounds normal; no masses,  no organomegaly Male genitalia: urologist Rectal: urologist Extremities: extremities normal, atraumatic, no cyanosis or edema Pulses: 2+ and symmetric Skin: Skin color, texture, turgor normal. No rashes or lesions Lymph nodes: Cervical, supraclavicular, and axillary nodes normal. Neurologic: Alert and oriented X 3, normal strength and tone. Normal symmetric reflexes. Normal coordination and gait Psych--no depression, no anxiety     Assessment:    cpe Diabetes--per endo Hyperlipidemia--check labs htn---per cardio, stable Arthritis L toe-- improved with celebrex      Plan:     During the course of the visit the patient was educated and counseled about appropriate screening and preventive services including:    Pneumococcal vaccine   Influenza vaccine  Td vaccine  Screening electrocardiogram  Prostate cancer screening  Colorectal cancer screening  Diabetes screening  Advanced directives: he will talk to a lawyer  Diet review for nutrition referral? Yes ____  Not Indicated __x__   Patient Instructions (the written plan) was given to the patient.  Medicare Attestation I have personally reviewed: The patient's medical and social history Their use of alcohol,  tobacco or illicit drugs Their current medications and supplements The patient's functional ability including ADLs,fall risks, home safety risks, cognitive, and hearing and visual impairment Diet and physical activities Evidence for depression or mood disorders  The patient's weight, height, BMI, and visual acuity have been recorded in the chart.  I have made referrals, counseling, and provided education to the patient based on review of the above and I have provided the patient with a written personalized care plan for preventive services.     Loreen Freud, DO   07/26/2011

## 2011-08-05 ENCOUNTER — Other Ambulatory Visit (INDEPENDENT_AMBULATORY_CARE_PROVIDER_SITE_OTHER): Payer: Medicare Other

## 2011-08-05 DIAGNOSIS — D7289 Other specified disorders of white blood cells: Secondary | ICD-10-CM

## 2011-08-05 LAB — CBC WITH DIFFERENTIAL/PLATELET
HCT: 46.1 % (ref 39.0–52.0)
Hemoglobin: 16.4 g/dL (ref 13.0–17.0)
Lymphocytes Relative: 33 % (ref 12–46)
Lymphs Abs: 1.9 10*3/uL (ref 0.7–4.0)
Monocytes Absolute: 0.4 10*3/uL (ref 0.1–1.0)
Monocytes Relative: 8 % (ref 3–12)
Neutro Abs: 3.1 10*3/uL (ref 1.7–7.7)
Neutrophils Relative %: 53 % (ref 43–77)
RBC: 5.07 MIL/uL (ref 4.22–5.81)

## 2011-08-05 NOTE — Progress Notes (Signed)
Labs only

## 2011-08-09 ENCOUNTER — Other Ambulatory Visit (INDEPENDENT_AMBULATORY_CARE_PROVIDER_SITE_OTHER): Payer: Medicare Other

## 2011-08-09 DIAGNOSIS — Z1211 Encounter for screening for malignant neoplasm of colon: Secondary | ICD-10-CM

## 2011-08-09 LAB — HEMOCCULT GUIAC POC 1CARD (OFFICE)
Card #2 Fecal Occult Blod, POC: NEGATIVE
Card #3 Fecal Occult Blood, POC: NEGATIVE
Fecal Occult Blood, POC: NEGATIVE

## 2011-08-09 NOTE — Progress Notes (Signed)
Labs only

## 2011-10-12 DIAGNOSIS — R972 Elevated prostate specific antigen [PSA]: Secondary | ICD-10-CM | POA: Diagnosis not present

## 2011-10-19 DIAGNOSIS — R972 Elevated prostate specific antigen [PSA]: Secondary | ICD-10-CM | POA: Diagnosis not present

## 2011-10-19 DIAGNOSIS — N401 Enlarged prostate with lower urinary tract symptoms: Secondary | ICD-10-CM | POA: Diagnosis not present

## 2011-11-04 ENCOUNTER — Other Ambulatory Visit: Payer: Self-pay | Admitting: Family Medicine

## 2011-11-04 DIAGNOSIS — D7289 Other specified disorders of white blood cells: Secondary | ICD-10-CM

## 2011-11-07 ENCOUNTER — Other Ambulatory Visit (INDEPENDENT_AMBULATORY_CARE_PROVIDER_SITE_OTHER): Payer: Medicare Other

## 2011-11-07 DIAGNOSIS — D7289 Other specified disorders of white blood cells: Secondary | ICD-10-CM | POA: Diagnosis not present

## 2011-11-07 LAB — CBC WITH DIFFERENTIAL/PLATELET
Basophils Absolute: 0 10*3/uL (ref 0.0–0.1)
Eosinophils Absolute: 0.3 10*3/uL (ref 0.0–0.7)
Hemoglobin: 16.5 g/dL (ref 13.0–17.0)
Lymphocytes Relative: 22.5 % (ref 12.0–46.0)
MCHC: 34.7 g/dL (ref 30.0–36.0)
Neutro Abs: 3.1 10*3/uL (ref 1.4–7.7)
Neutrophils Relative %: 61.4 % (ref 43.0–77.0)
Platelets: 117 10*3/uL — ABNORMAL LOW (ref 150.0–400.0)
RDW: 12.7 % (ref 11.5–14.6)

## 2011-11-09 DIAGNOSIS — D7289 Other specified disorders of white blood cells: Secondary | ICD-10-CM

## 2011-11-14 ENCOUNTER — Telehealth: Payer: Self-pay | Admitting: Oncology

## 2011-11-14 ENCOUNTER — Telehealth: Payer: Self-pay | Admitting: Hematology & Oncology

## 2011-11-14 NOTE — Telephone Encounter (Signed)
Received call from Amy RN, per her message I called patient and he would prefer [facility] because it is only a mile from where he lives. I left Amy voice mail.

## 2011-11-14 NOTE — Telephone Encounter (Signed)
called pt and scheduled new pt appt for 02/13.  will fax ove a letter to Dr. Laury Axon with appt d/t

## 2011-11-15 ENCOUNTER — Telehealth: Payer: Self-pay | Admitting: Oncology

## 2011-11-15 NOTE — Telephone Encounter (Signed)
Referred by Dr. Loreen Freud Dx- Decreasing Plts

## 2011-11-20 ENCOUNTER — Encounter: Payer: Self-pay | Admitting: Oncology

## 2011-11-20 DIAGNOSIS — D696 Thrombocytopenia, unspecified: Secondary | ICD-10-CM | POA: Insufficient documentation

## 2011-11-23 ENCOUNTER — Other Ambulatory Visit: Payer: Medicare Other | Admitting: Lab

## 2011-11-23 ENCOUNTER — Ambulatory Visit (HOSPITAL_BASED_OUTPATIENT_CLINIC_OR_DEPARTMENT_OTHER): Payer: Medicare Other | Admitting: Oncology

## 2011-11-23 ENCOUNTER — Telehealth: Payer: Self-pay | Admitting: Oncology

## 2011-11-23 ENCOUNTER — Ambulatory Visit: Payer: Medicare Other

## 2011-11-23 ENCOUNTER — Encounter: Payer: Self-pay | Admitting: Oncology

## 2011-11-23 DIAGNOSIS — E039 Hypothyroidism, unspecified: Secondary | ICD-10-CM

## 2011-11-23 DIAGNOSIS — D696 Thrombocytopenia, unspecified: Secondary | ICD-10-CM | POA: Diagnosis not present

## 2011-11-23 DIAGNOSIS — D649 Anemia, unspecified: Secondary | ICD-10-CM

## 2011-11-23 LAB — MORPHOLOGY
PLT EST: DECREASED
RBC Comments: NORMAL

## 2011-11-23 LAB — CBC WITH DIFFERENTIAL/PLATELET
Basophils Absolute: 0 10*3/uL (ref 0.0–0.1)
EOS%: 5.9 % (ref 0.0–7.0)
Eosinophils Absolute: 0.3 10*3/uL (ref 0.0–0.5)
HGB: 16.5 g/dL (ref 13.0–17.1)
MCH: 33.4 pg (ref 27.2–33.4)
NEUT#: 2.8 10*3/uL (ref 1.5–6.5)
RBC: 4.96 10*6/uL (ref 4.20–5.82)
RDW: 12.2 % (ref 11.0–14.6)
lymph#: 1.2 10*3/uL (ref 0.9–3.3)

## 2011-11-23 LAB — CHCC SMEAR

## 2011-11-23 NOTE — Progress Notes (Signed)
[facility] INITIAL HEMATOLOGY CONSULTATION  Referral MD:  Dr. Loreen Freud, D.O.   Reason for Referral: thrombocytopenia.     HPI: [name] is a 66 year old man with multiple medical issues including hypothyroidism, DM, hyperlipidemia.  He has had routine CBC for many years.  I reviewed his extensive lab record provided by patient himself.  In 2005, he started having mild thrombocytopenia in the range of 130's.  Ever since, it has been slowly declining.  On 11/07/2011, his PCP obtained CBC which showed WBC 5.0; Hgb 16.5; Plt 117.  Given this progressive thromboctyopenia, he was kindly referred to the [facility] for evaluation.    He is here with his wife today.  He has mild headache at time.  Headache is frontal , worse with noise and light.  He denies nausea/vomiting, visual changes, focal motor weakness.  He has mild fatigue; however, he is independent of all activities of daily living.  He denies anorexia, weight loss, bleeding.  Patient denies confusion, drenching night sweats, palpable lymph node swelling, mucositis, odynophagia, dysphagia, nausea vomiting, jaundice, chest pain, palpitation, shortness of breath, dyspnea on exertion, productive cough, gum bleeding, epistaxis, hematemesis, hemoptysis, abdominal pain, abdominal swelling, early satiety, melena, hematochezia, hematuria, skin rash, spontaneous bleeding, joint swelling, joint pain, heat or cold intolerance, bowel bladder incontinence, back pain, focal motor weakness, paresthesia, depression, suicidal or homocidal ideation, feeling hopelessness..    Past Medical History  Diagnosis Date  . CAD (coronary artery disease)     s/p PTCI in 2003 and 2004 to the LAD.   . Diabetes mellitus   . Hypertension   . Hyperlipidemia   . Hypothyroid   . Arthritis   . Depression   . Internal hemorrhoids   . Thrombocytopenia   . TIA (transient ischemic attack)     per patient's report.  He was never officially given this  diagnosis  . Migraine headache   . Skin cancer, basal cell   :    Past Surgical History  Procedure Date  . Hernia repair   . Tosillectomy   . Thyroidectomy, partial 07/2002    right thyroid  . Coronary artery disease status post placement     of drug-eluting stent in the LAD in 2003,eEF 65% then  . Right toe bone spur surgery   :   CURRENT MEDS: Current Outpatient Prescriptions  Medication Sig Dispense Refill  . aspirin 81 MG tablet Take 81 mg by mouth daily.        . Biotin 1000 MCG tablet Take 1,000 mcg by mouth 2 (two) times daily.        . celecoxib (CELEBREX) 200 MG capsule Take 200 mg by mouth daily.       . Cinnamon 500 MG capsule Take 500 mg by mouth 2 (two) times daily.       . Coenzyme Q10 (COQ-10) 100 MG capsule Take 100 mg by mouth daily.        Marland Kitchen ezetimibe-simvastatin (VYTORIN) 10-20 MG per tablet Take 1 tablet by mouth at bedtime.        . fluticasone (CUTIVATE) 0.05 % cream Apply topically daily.      . Ginger, Zingiber officinalis, (GINGER ROOT) 550 MG CAPS Take 1 capsule by mouth daily.        Marland Kitchen levothyroxine (SYNTHROID, LEVOTHROID) 112 MCG tablet Take 112 mcg by mouth daily.        Marland Kitchen losartan-hydrochlorothiazide (HYZAAR) 50-12.5 MG per tablet TAKE ONE TABLET BY MOUTH ONE  TIME DAILY  30 tablet  11  . metFORMIN (GLUMETZA) 500 MG (MOD) 24 hr tablet Take 1,000 mg by mouth daily.       . metoprolol (TOPROL-XL) 50 MG 24 hr tablet TAKE ONE TABLET BY MOUTH ONE TIME DAILY  30 tablet  10  . multivitamin (THERAGRAN) per tablet Take 1 tablet by mouth daily.        . niacin (NIASPAN) 750 MG CR tablet Take 1,500 mg by mouth at bedtime.       . nitroGLYCERIN (NITROSTAT) 0.4 MG SL tablet Place 0.4 mg under the tongue every 5 (five) minutes as needed.        . NON FORMULARY Take 1 capsule by mouth daily. ( Tumeric 500 mg )       . Omega-3 Fatty Acids (FISH OIL) 1200 MG CAPS Take 2 capsules by mouth 2 (two) times daily.        Marland Kitchen POLICOSANOL PO Take 10 mg by mouth daily.          . Tamsulosin HCl (FLOMAX) 0.4 MG CAPS Take 0.4 mg by mouth daily.            Allergies  Allergen Reactions  . Codeine   . Crestor (Rosuvastatin Calcium)   . Hydrocodone-Acetaminophen   . Rosuvastatin     REACTION: REALLY BAD JOINT/MUSCLE PAIN  :  Family History  Problem Relation Age of Onset  . Mental illness Mother     alzheimers  . Heart disease Mother   . Heart disease Father     triple by pass  . Hypertension Brother     Prostate Cancer  . Hypertension Brother   . Hypertension Brother   :  History   Social History  . Marital Status: Married    Spouse Name: N/A    Number of Children: 0  . Years of Education: N/A   Occupational History  . retired     Airline pilot   Social History Main Topics  . Smoking status: Former Smoker -- 5 years    Types: Cigarettes    Quit date: 10/10/1965  . Smokeless tobacco: Not on file  . Alcohol Use: No  . Drug Use: No  . Sexually Active: Not on file   Other Topics Concern  . Not on file   Social History Narrative  . No narrative on file    REVIEW OF SYSTEM:  The rest of the 14-point review of sytem was negative.   Exam:  General:  well-nourished in no acute distress.  Eyes:  no scleral icterus.  ENT:  There were no oropharyngeal lesions.  Neck was without thyromegaly.  Lymphatics:  Negative cervical, supraclavicular or axillary adenopathy.  Respiratory: lungs were clear bilaterally without wheezing or crackles.  Cardiovascular:  Regular rate and rhythm, S1/S2, without murmur, rub or gallop.  There was no pedal edema.  GI:  abdomen was soft, flat, nontender, nondistended, without organomegaly.  Muscoloskeletal:  no spinal tenderness of palpation of vertebral spine.  Skin exam was without echymosis, petichae.  Neuro exam was nonfocal.  Patient was able to get on and off exam table without assistance.  Gait was normal.  Patient was alerted and oriented.  Attention was good.   Language was appropriate.  Mood was normal without  depression.  Speech was not pressured.  Thought content was not tangential.    LABS:  Lab Results  Component Value Date   WBC 4.7 11/23/2011   HGB 16.5 11/23/2011   HCT 47.0  11/23/2011   PLT 109 Platelet count consistent in citrate* 11/23/2011   GLUCOSE 97 11/23/2011   CHOL 104 07/26/2011   TRIG 33.0 07/26/2011   HDL 59.10 07/26/2011   LDLCALC 38 07/26/2011   ALT 20 11/23/2011   AST 23 11/23/2011   NA 142 11/23/2011   K 4.2 11/23/2011   CL 105 11/23/2011   CREATININE 1.24 11/23/2011   BUN 19 11/23/2011   CO2 28 11/23/2011   PSA 2.14 04/14/2010   INR 1.0 RATIO 12/11/2008   MICROALBUR 0.8 07/26/2011    Blood smear review:   I personally reviewed the patient's peripheral blood smear today.  There was anisocytosis.  There was no peripheral blast.  There was no schistocytosis, spherocytosis, target cell, rouleaux formation, tear drop cell.  There was no giant platelets or platelet clumps.  His neutrophils appeared slightly decreased granulation and hypolobation.     ASSESSMENT AND PLAN:  1.  Diabetes mellitus, type II:  He is on metformin per PCP. 2.  HLP:  He is on Vytorin per PCP.  3.  Hypothryoidism:  He is on Synthroid per PCP. 4.  CAD history:  He is on ASA, Vytorin, Metroprolol per PCP.  5.  Progressive thrombocytopenia:  - Differential diagnosis:  With hypolobation, I need to rule out hypothyrodism, VitB12, folate deficiency.  I will also need to rule out myeloma.  With progressive thrombocytopenia and hypogranulation, MDS is certainly on the differential.  Cannot rule out possibility of statin-induced cytopenia.   He has no clinical concern for liver disease.  There was no sign of splenomegaly on exam.   - Work up:  I sent for SPEP, TSH, Vit B12, folate.  At this time, a diagnostic bone marrow biopsy is slightly premature.  If he were to have MDS, most likely that he would have low risk given only isolated thrombocytopenia. In which case, the treatment would be observation.  I discussed  with patient and his wife that spontaneous hemorrhage is rare with platelet above 30K.   - Recommendation:  Observation with q71month CBC here at the [facility].  I will see him in about 6 months.  In the future, if his thrombocytopenia worsens to less than 30K, or he develops pancytopenia, then a diagnostic bone marrow biopsy would have for diagnostic utility.    The length of time of the face-to-face encounter was 30 minutes. More than 50% of time was spent counseling and coordination of care.

## 2011-11-23 NOTE — Telephone Encounter (Signed)
gv pt appt schedule for April/june/july.

## 2011-11-24 ENCOUNTER — Other Ambulatory Visit: Payer: Self-pay | Admitting: Oncology

## 2011-11-24 DIAGNOSIS — D649 Anemia, unspecified: Secondary | ICD-10-CM

## 2011-11-24 DIAGNOSIS — E119 Type 2 diabetes mellitus without complications: Secondary | ICD-10-CM | POA: Diagnosis not present

## 2011-11-24 DIAGNOSIS — D696 Thrombocytopenia, unspecified: Secondary | ICD-10-CM

## 2011-11-24 DIAGNOSIS — E789 Disorder of lipoprotein metabolism, unspecified: Secondary | ICD-10-CM | POA: Diagnosis not present

## 2011-11-24 LAB — VITAMIN B12: Vitamin B-12: 566 pg/mL (ref 211–911)

## 2011-11-25 LAB — COMPREHENSIVE METABOLIC PANEL
AST: 23 U/L (ref 0–37)
Albumin: 4 g/dL (ref 3.5–5.2)
Alkaline Phosphatase: 51 U/L (ref 39–117)
BUN: 19 mg/dL (ref 6–23)
Potassium: 4.2 mEq/L (ref 3.5–5.3)
Total Bilirubin: 0.7 mg/dL (ref 0.3–1.2)

## 2011-11-25 LAB — PROTEIN ELECTROPHORESIS, SERUM
Albumin ELP: 59.5 % (ref 55.8–66.1)
Alpha-1-Globulin: 4.1 % (ref 2.9–4.9)
Alpha-2-Globulin: 7.4 % (ref 7.1–11.8)
Gamma Globulin: 19.4 % — ABNORMAL HIGH (ref 11.1–18.8)
Total Protein, Serum Electrophoresis: 6.5 g/dL (ref 6.0–8.3)

## 2011-11-25 LAB — KAPPA/LAMBDA LIGHT CHAINS: Kappa:Lambda Ratio: 1.12 (ref 0.26–1.65)

## 2011-12-06 DIAGNOSIS — I10 Essential (primary) hypertension: Secondary | ICD-10-CM | POA: Diagnosis not present

## 2011-12-06 DIAGNOSIS — E789 Disorder of lipoprotein metabolism, unspecified: Secondary | ICD-10-CM | POA: Diagnosis not present

## 2011-12-06 DIAGNOSIS — E0789 Other specified disorders of thyroid: Secondary | ICD-10-CM | POA: Diagnosis not present

## 2011-12-06 DIAGNOSIS — E119 Type 2 diabetes mellitus without complications: Secondary | ICD-10-CM | POA: Diagnosis not present

## 2012-01-05 ENCOUNTER — Other Ambulatory Visit: Payer: Self-pay | Admitting: Cardiology

## 2012-01-18 ENCOUNTER — Other Ambulatory Visit (HOSPITAL_BASED_OUTPATIENT_CLINIC_OR_DEPARTMENT_OTHER): Payer: Medicare Other

## 2012-01-18 ENCOUNTER — Telehealth: Payer: Self-pay | Admitting: *Deleted

## 2012-01-18 ENCOUNTER — Other Ambulatory Visit: Payer: Medicare Other | Admitting: Lab

## 2012-01-18 ENCOUNTER — Ambulatory Visit: Payer: Medicare Other | Admitting: Oncology

## 2012-01-18 DIAGNOSIS — D696 Thrombocytopenia, unspecified: Secondary | ICD-10-CM

## 2012-01-18 DIAGNOSIS — D649 Anemia, unspecified: Secondary | ICD-10-CM

## 2012-01-18 LAB — CBC WITH DIFFERENTIAL/PLATELET
Basophils Absolute: 0 10*3/uL (ref 0.0–0.1)
Eosinophils Absolute: 0.3 10*3/uL (ref 0.0–0.5)
HCT: 43.2 % (ref 38.4–49.9)
HGB: 15.3 g/dL (ref 13.0–17.1)
LYMPH%: 12 % — ABNORMAL LOW (ref 14.0–49.0)
MCV: 94.1 fL (ref 79.3–98.0)
MONO#: 0.7 10*3/uL (ref 0.1–0.9)
NEUT#: 6.3 10*3/uL (ref 1.5–6.5)
Platelets: 111 10*3/uL — ABNORMAL LOW (ref 140–400)
RBC: 4.59 10*6/uL (ref 4.20–5.82)
WBC: 8.3 10*3/uL (ref 4.0–10.3)
nRBC: 0 % (ref 0–0)

## 2012-01-18 NOTE — Telephone Encounter (Signed)
Called pt's home and spoke w/ wife,  Pt was not home yet.  Gave her results of Platelets and per Dr. Gaylyn Rong count is stable and keep appt in June as scheduled.  She verbalized understanding.

## 2012-01-18 NOTE — Telephone Encounter (Signed)
Message copied by Wende Mott on Wed Jan 18, 2012  3:05 PM ------      Message from: HA, Raliegh Ip T      Created: Wed Jan 18, 2012  2:39 PM       Please call pt.  His mild thrombocytopenia is stable.  Continue observation for now.  Thanks.

## 2012-02-29 ENCOUNTER — Other Ambulatory Visit: Payer: Self-pay | Admitting: Cardiology

## 2012-02-29 NOTE — Telephone Encounter (Signed)
Fax Received. Refill Completed. [name] (R.M.A)   

## 2012-03-14 ENCOUNTER — Other Ambulatory Visit (HOSPITAL_BASED_OUTPATIENT_CLINIC_OR_DEPARTMENT_OTHER): Payer: Medicare Other | Admitting: Lab

## 2012-03-14 ENCOUNTER — Other Ambulatory Visit: Payer: Medicare Other | Admitting: Lab

## 2012-03-14 ENCOUNTER — Ambulatory Visit: Payer: Medicare Other | Admitting: Oncology

## 2012-03-14 DIAGNOSIS — D649 Anemia, unspecified: Secondary | ICD-10-CM

## 2012-03-14 DIAGNOSIS — D696 Thrombocytopenia, unspecified: Secondary | ICD-10-CM

## 2012-03-14 LAB — CBC WITH DIFFERENTIAL/PLATELET
Basophils Absolute: 0.1 10*3/uL (ref 0.0–0.1)
EOS%: 4.3 % (ref 0.0–7.0)
Eosinophils Absolute: 0.2 10*3/uL (ref 0.0–0.5)
HGB: 15.9 g/dL (ref 13.0–17.1)
LYMPH%: 25.1 % (ref 14.0–49.0)
MCH: 33.1 pg (ref 27.2–33.4)
MCV: 93.8 fL (ref 79.3–98.0)
MONO%: 6.7 % (ref 0.0–14.0)
NEUT#: 3.6 10*3/uL (ref 1.5–6.5)
Platelets: 124 10*3/uL — ABNORMAL LOW (ref 140–400)
RDW: 12.5 % (ref 11.0–14.6)

## 2012-05-09 ENCOUNTER — Telehealth: Payer: Self-pay | Admitting: Oncology

## 2012-05-09 ENCOUNTER — Ambulatory Visit (HOSPITAL_BASED_OUTPATIENT_CLINIC_OR_DEPARTMENT_OTHER): Payer: Medicare Other | Admitting: Oncology

## 2012-05-09 ENCOUNTER — Other Ambulatory Visit (HOSPITAL_BASED_OUTPATIENT_CLINIC_OR_DEPARTMENT_OTHER): Payer: Medicare Other | Admitting: Lab

## 2012-05-09 VITALS — BP 134/76 | HR 61 | Temp 96.8°F | Ht 68.0 in | Wt 170.6 lb

## 2012-05-09 DIAGNOSIS — E785 Hyperlipidemia, unspecified: Secondary | ICD-10-CM | POA: Diagnosis not present

## 2012-05-09 DIAGNOSIS — E039 Hypothyroidism, unspecified: Secondary | ICD-10-CM | POA: Diagnosis not present

## 2012-05-09 DIAGNOSIS — E119 Type 2 diabetes mellitus without complications: Secondary | ICD-10-CM

## 2012-05-09 DIAGNOSIS — D696 Thrombocytopenia, unspecified: Secondary | ICD-10-CM

## 2012-05-09 LAB — CBC WITH DIFFERENTIAL/PLATELET
BASO%: 1.2 % (ref 0.0–2.0)
EOS%: 6.6 % (ref 0.0–7.0)
HCT: 45.8 % (ref 38.4–49.9)
LYMPH%: 24.2 % (ref 14.0–49.0)
MCH: 33.1 pg (ref 27.2–33.4)
MCHC: 34.9 g/dL (ref 32.0–36.0)
MCV: 94.8 fL (ref 79.3–98.0)
MONO%: 9.7 % (ref 0.0–14.0)
NEUT%: 58.3 % (ref 39.0–75.0)
Platelets: 123 10*3/uL — ABNORMAL LOW (ref 140–400)
RBC: 4.83 10*6/uL (ref 4.20–5.82)
WBC: 4.5 10*3/uL (ref 4.0–10.3)

## 2012-05-09 LAB — COMPREHENSIVE METABOLIC PANEL
ALT: 29 U/L (ref 0–53)
BUN: 26 mg/dL — ABNORMAL HIGH (ref 6–23)
CO2: 29 mEq/L (ref 19–32)
Calcium: 9.5 mg/dL (ref 8.4–10.5)
Creatinine, Ser: 1.09 mg/dL (ref 0.50–1.35)
Glucose, Bld: 115 mg/dL — ABNORMAL HIGH (ref 70–99)
Total Bilirubin: 0.6 mg/dL (ref 0.3–1.2)

## 2012-05-09 LAB — MORPHOLOGY: PLT EST: DECREASED

## 2012-05-09 LAB — CHCC SMEAR

## 2012-05-09 NOTE — Telephone Encounter (Signed)
gv pt appt schedule for November 2013 and March/Juy 2014.

## 2012-05-09 NOTE — Patient Instructions (Addendum)
1. Diagnosis: Thrombocytopenia (low platelet count) 2. Potential causes: Hypothyroidism (low thyroid function) and the use of cholesterol medication. 3. Impression: Your platelet count is not a severely low. 4. Treatment: I therefore recommend to continue watchful observation this time. In the future, if your platelet count significantly decreases in the future or you develop also low white count and anemia, I may consider diagnostic bone marrow biopsy at that time. A diagnostic bone marrow biopsy at this time this premature in my opinion. 5. Followup: Lab only appointment in about 4 and than 8 months. Return visit in about one year.

## 2012-05-09 NOTE — Progress Notes (Signed)
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   CURRENT THERAPY:  Watchful observation.   INTERVAL HISTORY: [name] 66 y.o. male returns for regular follow up with his wife.  He reports feeling well.  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  She does not normally use sunscreen or avoid the sun.  He has many silvery, flaky spots on his skin.  He sees a Armed forces operational officer yearly.  He has not noticed any suspicious skin lesion lately. He has chronic stable back and joint pain.  Pain is worst when he first wakes up and gets active; pain improves with activity.  He does not need chronic pain meds.  He denied lower extremity weakness, leg numbness, tingling, bowel/bladder incontinence.   Patient denies fever, anorexia, weight loss, fatigue, headache, visual changes, confusion, drenching night sweats, palpable lymph node swelling, mucositis, odynophagia, dysphagia, nausea vomiting, jaundice, chest pain, palpitation, shortness of breath, dyspnea on exertion, productive cough, gum bleeding, epistaxis, hematemesis, hemoptysis, abdominal pain, abdominal swelling, early satiety, melena, hematochezia, hematuria, spontaneous bleeding, joint swelling, heat or cold intolerance, focal motor weakness, paresthesia.    Past Medical History  Diagnosis Date  . CAD (coronary artery disease)     s/p PTCI in 2003 and 2004 to the LAD.   . Diabetes mellitus   . Hypertension   . Hyperlipidemia   . Hypothyroid   . Arthritis   . Depression   . Internal hemorrhoids   . Thrombocytopenia   . TIA (transient ischemic attack)     per patient's report.  He was never officially given this diagnosis  . Migraine headache   . Skin cancer, basal cell     Past Surgical History  Procedure Date  . Hernia  repair   . Tosillectomy   . Thyroidectomy, partial 07/2002    right thyroid  . Coronary artery disease status post placement     of drug-eluting stent in the LAD in 2003,eEF 65% then  . Right toe bone spur surgery     Current Outpatient Prescriptions  Medication Sig Dispense Refill  . aspirin 81 MG tablet Take 81 mg by mouth daily.        . Biotin 1000 MCG tablet Take 1,000 mcg by mouth daily.       . celecoxib (CELEBREX) 200 MG capsule Take 200 mg by mouth daily.       . Cinnamon 500 MG capsule Take 500 mg by mouth 2 (two) times daily.       . Coenzyme Q10 (COQ-10) 100 MG capsule Take 100 mg by mouth daily.        Marland Kitchen ezetimibe-simvastatin (VYTORIN) 10-20 MG per tablet Take 1 tablet by mouth at bedtime.        . fluticasone (CUTIVATE) 0.05 % cream Apply topically as needed.       . Ginger, Zingiber officinalis, (GINGER ROOT) 550 MG CAPS Take 1 capsule by mouth daily.        Marland Kitchen levothyroxine (SYNTHROID, LEVOTHROID) 112 MCG tablet Take 112 mcg by mouth daily.        Marland Kitchen losartan-hydrochlorothiazide (HYZAAR) 50-12.5 MG per tablet TAKE ONE TABLET BY MOUTH ONE TIME DAILY  30 tablet  5  . metFORMIN (GLUMETZA) 500 MG (MOD) 24 hr tablet Take 1,000 mg by mouth daily.       Marland Kitchen  metoprolol succinate (TOPROL-XL) 50 MG 24 hr tablet TAKE ONE TABLET BY MOUTH ONE TIME DAILY  30 tablet  9  . multivitamin (THERAGRAN) per tablet Take 1 tablet by mouth daily.        . niacin (NIASPAN) 750 MG CR tablet Take 1,500 mg by mouth at bedtime.       . NON FORMULARY Take 1 capsule by mouth daily. ( Tumeric 500 mg )       . Omega-3 Fatty Acids (FISH OIL) 1200 MG CAPS Take 2 capsules by mouth 2 (two) times daily.        Marland Kitchen POLICOSANOL PO Take 10 mg by mouth daily.        . Tamsulosin HCl (FLOMAX) 0.4 MG CAPS Take 0.4 mg by mouth daily.        . nitroGLYCERIN (NITROSTAT) 0.4 MG SL tablet Place 0.4 mg under the tongue every 5 (five) minutes as needed.          ALLERGIES:  is allergic to codeine; crestor;  hydrocodone-acetaminophen; and rosuvastatin.  REVIEW OF SYSTEMS:  The rest of the 14-point review of system was negative.   Filed Vitals:   05/09/12 1042  BP: 134/76  Pulse: 61  Temp: 96.8 F (36 C)   Wt Readings from Last 3 Encounters:  05/09/12 170 lb 9.6 oz (77.384 kg)  11/23/11 175 lb (79.379 kg)  07/26/11 177 lb 3.2 oz (80.377 kg)   ECOG Performance status: 0  PHYSICAL EXAMINATION:  General:  well-nourished man, in no acute distress.  Eyes:  no scleral icterus.  ENT:  There were no oropharyngeal lesions.  Neck was without thyromegaly.  Lymphatics:  Negative cervical, supraclavicular or axillary adenopathy.  Respiratory: lungs were clear bilaterally without wheezing or crackles.  Cardiovascular:  Regular rate and rhythm, S1/S2, without murmur, rub or gallop.  There was no pedal edema.  GI:  abdomen was soft, flat, nontender, nondistended, without organomegaly.  Muscoloskeletal:  no spinal tenderness of palpation of vertebral spine.  Skin exam was without echymosis, petichae.  There were many dry, silvery, flaky skin areas on the extensor, sun-exposed surfaces of his arms.  Neuro exam was nonfocal.  Patient was able to get on and off exam table without assistance.  Gait was normal.  Patient was alerted and oriented.  Attention was good.   Language was appropriate.  Mood was normal without depression.  Speech was not pressured.  Thought content was not tangential.    LABORATORY/RADIOLOGY DATA:  Lab Results  Component Value Date   WBC 4.5 05/09/2012   HGB 16.0 05/09/2012   HCT 45.8 05/09/2012   PLT 123* 05/09/2012   GLUCOSE 97 11/23/2011   CHOL 104 07/26/2011   TRIG 33.0 07/26/2011   HDL 59.10 07/26/2011   LDLCALC 38 07/26/2011   ALKPHOS 51 11/23/2011   ALT 20 11/23/2011   AST 23 11/23/2011   NA 142 11/23/2011   K 4.2 11/23/2011   CL 105 11/23/2011   CREATININE 1.24 11/23/2011   BUN 19 11/23/2011   CO2 28 11/23/2011   PSA 2.14 04/14/2010   INR 1.0 RATIO 12/11/2008   MICROALBUR 0.8  07/26/2011    ASSESSMENT AND PLAN:   1. Diabetes mellitus, type II: He is on metformin per PCP.  2. HLP: He is on Vytorin per PCP.  3. Hypothryoidism: He is on Synthroid per PCP.  4. CAD history: He is on ASA, Vytorin, Metroprolol per PCP.  5. Thrombocytopenia   -  Potential causes: Hypothyroidism and the  use of cholesterol medication.  I cannot rule out MDS and other bone marrow failure states.  However, given his very mild thrombocytopenia (isolating) without leukopenia or anemia, a bone marrow at this time is of low clinical utility.  -  Treatment: I therefore recommend to continue watchful observation this time. In the future, if platelet count significantly decreases in or he develops also leukopenia and anemia, I may consider diagnostic bone marrow biopsy at that time. - Followup: Lab only appointment in about 4 and than 8 months. Return visit in about one year.   The length of time of the face-to-face encounter was 10 minutes. More than 50% of time was spent counseling and coordination of care.

## 2012-05-29 DIAGNOSIS — E119 Type 2 diabetes mellitus without complications: Secondary | ICD-10-CM | POA: Diagnosis not present

## 2012-05-29 DIAGNOSIS — E789 Disorder of lipoprotein metabolism, unspecified: Secondary | ICD-10-CM | POA: Diagnosis not present

## 2012-06-05 DIAGNOSIS — D696 Thrombocytopenia, unspecified: Secondary | ICD-10-CM | POA: Diagnosis not present

## 2012-06-05 DIAGNOSIS — R197 Diarrhea, unspecified: Secondary | ICD-10-CM | POA: Diagnosis not present

## 2012-06-05 DIAGNOSIS — Z79899 Other long term (current) drug therapy: Secondary | ICD-10-CM | POA: Diagnosis not present

## 2012-06-05 DIAGNOSIS — E0789 Other specified disorders of thyroid: Secondary | ICD-10-CM | POA: Diagnosis not present

## 2012-07-08 DIAGNOSIS — Z23 Encounter for immunization: Secondary | ICD-10-CM | POA: Diagnosis not present

## 2012-07-26 ENCOUNTER — Encounter: Payer: Medicare Other | Admitting: Family Medicine

## 2012-08-14 ENCOUNTER — Other Ambulatory Visit: Payer: Self-pay | Admitting: Cardiology

## 2012-08-21 ENCOUNTER — Encounter: Payer: Self-pay | Admitting: Family Medicine

## 2012-08-21 ENCOUNTER — Ambulatory Visit (INDEPENDENT_AMBULATORY_CARE_PROVIDER_SITE_OTHER): Payer: Medicare Other | Admitting: Family Medicine

## 2012-08-21 VITALS — BP 130/78 | HR 66 | Temp 98.1°F | Ht 69.0 in | Wt 176.0 lb

## 2012-08-21 DIAGNOSIS — E039 Hypothyroidism, unspecified: Secondary | ICD-10-CM

## 2012-08-21 DIAGNOSIS — Z0000 Encounter for general adult medical examination without abnormal findings: Secondary | ICD-10-CM

## 2012-08-21 DIAGNOSIS — I10 Essential (primary) hypertension: Secondary | ICD-10-CM | POA: Diagnosis not present

## 2012-08-21 DIAGNOSIS — E785 Hyperlipidemia, unspecified: Secondary | ICD-10-CM

## 2012-08-21 DIAGNOSIS — I2510 Atherosclerotic heart disease of native coronary artery without angina pectoris: Secondary | ICD-10-CM

## 2012-08-21 DIAGNOSIS — D702 Other drug-induced agranulocytosis: Secondary | ICD-10-CM | POA: Diagnosis not present

## 2012-08-21 DIAGNOSIS — E119 Type 2 diabetes mellitus without complications: Secondary | ICD-10-CM | POA: Diagnosis not present

## 2012-08-21 LAB — BASIC METABOLIC PANEL
CO2: 29 mEq/L (ref 19–32)
Chloride: 102 mEq/L (ref 96–112)
Glucose, Bld: 89 mg/dL (ref 70–99)
Potassium: 3.7 mEq/L (ref 3.5–5.1)
Sodium: 139 mEq/L (ref 135–145)

## 2012-08-21 LAB — POCT URINALYSIS DIPSTICK
Bilirubin, UA: NEGATIVE
Blood, UA: NEGATIVE
Glucose, UA: NEGATIVE
Leukocytes, UA: NEGATIVE
Nitrite, UA: NEGATIVE
Urobilinogen, UA: 0.2
pH, UA: 5

## 2012-08-21 LAB — CBC WITH DIFFERENTIAL/PLATELET
Basophils Absolute: 0 10*3/uL (ref 0.0–0.1)
Basophils Relative: 0.5 % (ref 0.0–3.0)
Eosinophils Absolute: 0.3 10*3/uL (ref 0.0–0.7)
HCT: 47.9 % (ref 39.0–52.0)
Hemoglobin: 16.5 g/dL (ref 13.0–17.0)
Lymphs Abs: 1.4 10*3/uL (ref 0.7–4.0)
MCHC: 34.5 g/dL (ref 30.0–36.0)
MCV: 93.5 fl (ref 78.0–100.0)
Neutro Abs: 4.3 10*3/uL (ref 1.4–7.7)
RBC: 5.13 Mil/uL (ref 4.22–5.81)
RDW: 12.6 % (ref 11.5–14.6)

## 2012-08-21 LAB — MICROALBUMIN / CREATININE URINE RATIO
Creatinine,U: 154.3 mg/dL
Microalb Creat Ratio: 1.2 mg/g (ref 0.0–30.0)

## 2012-08-21 LAB — HEPATIC FUNCTION PANEL
Albumin: 3.7 g/dL (ref 3.5–5.2)
Alkaline Phosphatase: 57 U/L (ref 39–117)
Total Protein: 6.7 g/dL (ref 6.0–8.3)

## 2012-08-21 NOTE — Assessment & Plan Note (Signed)
Per endo °

## 2012-08-21 NOTE — Assessment & Plan Note (Signed)
Per Dr Juleen China

## 2012-08-21 NOTE — Progress Notes (Signed)
Subjective:    [name] is a 66 y.o. male who presents for Medicare Annual/Subsequent preventive examination.   Preventive Screening-Counseling & Management  Tobacco History  Smoking status  . Former Smoker -- 5 years  . Types: Cigarettes  . Quit date: 10/10/1965  Smokeless tobacco  . Not on file    Problems Prior to Visit 1.   Current Problems (verified) Patient Active Problem List  Diagnosis  . HYPERTHYROIDISM  . HYPOTHYROIDISM  . DIABETES-TYPE 2  . Other and Unspecified Hyperlipidemia  . DEPRESSION  . HYPERTENSION, BENIGN  . HYPERTENSION  . ANGINA PECTORIS  . CORONARY ARTERY DISEASE  . CAD, NATIVE VESSEL  . HEMORRHOIDS-INTERNAL  . RECTAL BLEEDING  . BLOOD IN STOOL  . OSTEOARTHRITIS  . HEADACHE  . ABDOMINAL BRUIT  . BLOOD IN STOOL, OCCULT  . PERSONAL HISTORY OF COLONIC POLYPS  . INFLUENZA WITH OTHER MANIFESTATIONS  . Hyperlipidemia    Medications Prior to Visit Current Outpatient Prescriptions on File Prior to Visit  Medication Sig Dispense Refill  . aspirin 81 MG tablet Take 81 mg by mouth daily.        . Biotin 1000 MCG tablet Take 1,000 mcg by mouth daily.       . celecoxib (CELEBREX) 200 MG capsule Take 200 mg by mouth daily.       . Cinnamon 500 MG capsule Take 500 mg by mouth 2 (two) times daily.       . Coenzyme Q10 (COQ-10) 100 MG capsule Take 100 mg by mouth daily.        Marland Kitchen ezetimibe-simvastatin (VYTORIN) 10-20 MG per tablet Take 1 tablet by mouth at bedtime.        . fluticasone (CUTIVATE) 0.05 % cream Apply topically as needed.       . Ginger, Zingiber officinalis, (GINGER ROOT) 550 MG CAPS Take 1 capsule by mouth daily.        Marland Kitchen levothyroxine (SYNTHROID, LEVOTHROID) 112 MCG tablet Take 112 mcg by mouth daily.        Marland Kitchen losartan-hydrochlorothiazide (HYZAAR) 50-12.5 MG per tablet TAKE ONE TABLET BY MOUTH ONE TIME DAILY  30 tablet  1  . metFORMIN (GLUMETZA) 500 MG (MOD) 24 hr tablet Take 1,000 mg by mouth daily.       . metoprolol succinate  (TOPROL-XL) 50 MG 24 hr tablet TAKE ONE TABLET BY MOUTH ONE TIME DAILY  30 tablet  9  . multivitamin (THERAGRAN) per tablet Take 1 tablet by mouth daily.        . nitroGLYCERIN (NITROSTAT) 0.4 MG SL tablet Place 0.4 mg under the tongue every 5 (five) minutes as needed.        . NON FORMULARY Take 1 capsule by mouth daily. ( Tumeric 500 mg )       . Omega-3 Fatty Acids (FISH OIL) 1200 MG CAPS Take 2 capsules by mouth 2 (two) times daily.        . Tamsulosin HCl (FLOMAX) 0.4 MG CAPS Take 0.4 mg by mouth daily.          Current Medications (verified) Current Outpatient Prescriptions  Medication Sig Dispense Refill  . aspirin 81 MG tablet Take 81 mg by mouth daily.        . Biotin 1000 MCG tablet Take 1,000 mcg by mouth daily.       . celecoxib (CELEBREX) 200 MG capsule Take 200 mg by mouth daily.       . Cinnamon 500 MG capsule Take 500  mg by mouth 2 (two) times daily.       . Coenzyme Q10 (COQ-10) 100 MG capsule Take 100 mg by mouth daily.        Marland Kitchen ezetimibe-simvastatin (VYTORIN) 10-20 MG per tablet Take 1 tablet by mouth at bedtime.        . fluticasone (CUTIVATE) 0.05 % cream Apply topically as needed.       . Ginger, Zingiber officinalis, (GINGER ROOT) 550 MG CAPS Take 1 capsule by mouth daily.        Marland Kitchen levothyroxine (SYNTHROID, LEVOTHROID) 112 MCG tablet Take 112 mcg by mouth daily.        Marland Kitchen losartan-hydrochlorothiazide (HYZAAR) 50-12.5 MG per tablet TAKE ONE TABLET BY MOUTH ONE TIME DAILY  30 tablet  1  . metFORMIN (GLUMETZA) 500 MG (MOD) 24 hr tablet Take 1,000 mg by mouth daily.       . metoprolol succinate (TOPROL-XL) 50 MG 24 hr tablet TAKE ONE TABLET BY MOUTH ONE TIME DAILY  30 tablet  9  . multivitamin (THERAGRAN) per tablet Take 1 tablet by mouth daily.        . nitroGLYCERIN (NITROSTAT) 0.4 MG SL tablet Place 0.4 mg under the tongue every 5 (five) minutes as needed.        . NON FORMULARY Take 1 capsule by mouth daily. ( Tumeric 500 mg )       . Omega-3 Fatty Acids (FISH OIL)  1200 MG CAPS Take 2 capsules by mouth 2 (two) times daily.        . Tamsulosin HCl (FLOMAX) 0.4 MG CAPS Take 0.4 mg by mouth daily.        . traMADol (ULTRAM) 50 MG tablet Take 50 mg by mouth as needed. For headache         Allergies (verified) Codeine; Crestor; Hydrocodone-acetaminophen; and Rosuvastatin   PAST HISTORY  Family History Family History  Problem Relation Age of Onset  . Mental illness Mother     alzheimers  . Heart disease Mother   . Heart disease Father     triple by pass  . Hypertension Brother     Prostate Cancer  . Hypertension Brother   . Hypertension Brother     Social History History  Substance Use Topics  . Smoking status: Former Smoker -- 5 years    Types: Cigarettes    Quit date: 10/10/1965  . Smokeless tobacco: Not on file  . Alcohol Use: No    Are there smokers in your home (other than you)?  No  Risk Factors Current exercise habits: walks 2 miles qd   Dietary issues discussed: na   Cardiac risk factors: dyslipidemia, hypertension and male gender.  Depression Screen (Note: if answer to either of the following is "Yes", a more complete depression screening is indicated)   Q1: Over the past two weeks, have you felt down, depressed or hopeless? No  Q2: Over the past two weeks, have you felt little interest or pleasure in doing things? No  Have you lost interest or pleasure in daily life? No  Do you often feel hopeless? No  Do you cry easily over simple problems? No  Activities of Daily Living In your present state of health, do you have any difficulty performing the following activities?:  Driving? No Managing money?  No Feeding yourself? No Getting from bed to chair? No Climbing a flight of stairs? No Preparing food and eating?: No Bathing or showering? No Getting dressed: No Getting to the  toilet? No Using the toilet:No Moving around from place to place: No In the past year have you fallen or had a near fall?:No   Are you  sexually active?  Yes  Do you have more than one partner?  No  Hearing Difficulties: No Do you often ask people to speak up or repeat themselves? No Do you experience ringing or noises in your ears? No Do you have difficulty understanding soft or whispered voices? No   Do you feel that you have a problem with memory? No  Do you often misplace items? No  Do you feel safe at home?  Yes  Cognitive Testing  Alert? Yes  Normal Appearance?Yes  Oriented to person? Yes  Place? Yes   Time? Yes  Recall of three objects?  Yes  Can perform simple calculations? Yes  Displays appropriate judgment?Yes  Can read the correct time from a watch face?Yes   Advanced Directives have been discussed with the patient? Yes   List the Names of Other Physician/Practitioners you currently use: 1.  Urologist--- borden 2. Cardiology-- crenshaw 3 [facility]-- endo 4  Ha--- heme 5. Dentist- reading 6  optho--mccuen 7 podiatry-- aljouney Indicate any recent Medical Services you may have received from other than [facility] providers in the past year (date may be approximate).  Immunization History  Administered Date(s) Administered  . Influenza Split 07/26/2011  . Influenza Whole 08/30/2007, 07/08/2009, 07/06/2010  . Pneumococcal Polysaccharide 04/23/2003, 07/08/2009  . Td 02/19/2003  . Zoster 02/21/2007    Screening Tests Health Maintenance  Topic Date Due  . Tetanus/tdap  02/18/2013  . Influenza Vaccine  06/10/2013  . Colonoscopy  07/29/2015  . Pneumococcal Polysaccharide Vaccine Age 66 And Over  Completed  . Zostavax  Completed    All answers were reviewed with the patient and necessary referrals were made:  Loreen Freud, DO   08/21/2012   History reviewed:  He  has a past medical history of CAD (coronary artery disease); Diabetes mellitus; Hypertension; Hyperlipidemia; Hypothyroid; Arthritis; Depression; Internal hemorrhoids; Thrombocytopenia; TIA (transient ischemic attack); Migraine headache; and Skin  cancer, basal cell. He  does not have any pertinent problems on file. He  has past surgical history that includes Hernia repair; tosillectomy; Thyroidectomy, partial (07/2002); coronary artery disease status post placement; and right toe bone spur surgery. His family history includes Heart disease in his father and mother; Hypertension in his brothers; and Mental illness in his mother. He  reports that he quit smoking about 46 years ago. His smoking use included Cigarettes. He quit after 5 years of use. He does not have any smokeless tobacco history on file. He reports that he does not drink alcohol or use illicit drugs. He has a current medication list which includes the following prescription(s): aspirin, biotin, celecoxib, cinnamon, coq-10, ezetimibe-simvastatin, fluticasone, ginger root, levothyroxine, losartan-hydrochlorothiazide, metformin, metoprolol succinate, multivitamin, nitroglycerin, NON FORMULARY, fish oil, tamsulosin hcl, and tramadol. Current Outpatient Prescriptions on File Prior to Visit  Medication Sig Dispense Refill  . aspirin 81 MG tablet Take 81 mg by mouth daily.        . Biotin 1000 MCG tablet Take 1,000 mcg by mouth daily.       . celecoxib (CELEBREX) 200 MG capsule Take 200 mg by mouth daily.       . Cinnamon 500 MG capsule Take 500 mg by mouth 2 (two) times daily.       . Coenzyme Q10 (COQ-10) 100 MG capsule Take 100 mg by mouth daily.        Marland Kitchen  ezetimibe-simvastatin (VYTORIN) 10-20 MG per tablet Take 1 tablet by mouth at bedtime.        . fluticasone (CUTIVATE) 0.05 % cream Apply topically as needed.       . Ginger, Zingiber officinalis, (GINGER ROOT) 550 MG CAPS Take 1 capsule by mouth daily.        Marland Kitchen levothyroxine (SYNTHROID, LEVOTHROID) 112 MCG tablet Take 112 mcg by mouth daily.        Marland Kitchen losartan-hydrochlorothiazide (HYZAAR) 50-12.5 MG per tablet TAKE ONE TABLET BY MOUTH ONE TIME DAILY  30 tablet  1  . metFORMIN (GLUMETZA) 500 MG (MOD) 24 hr tablet Take 1,000 mg by  mouth daily.       . metoprolol succinate (TOPROL-XL) 50 MG 24 hr tablet TAKE ONE TABLET BY MOUTH ONE TIME DAILY  30 tablet  9  . multivitamin (THERAGRAN) per tablet Take 1 tablet by mouth daily.        . nitroGLYCERIN (NITROSTAT) 0.4 MG SL tablet Place 0.4 mg under the tongue every 5 (five) minutes as needed.        . NON FORMULARY Take 1 capsule by mouth daily. ( Tumeric 500 mg )       . Omega-3 Fatty Acids (FISH OIL) 1200 MG CAPS Take 2 capsules by mouth 2 (two) times daily.        . Tamsulosin HCl (FLOMAX) 0.4 MG CAPS Take 0.4 mg by mouth daily.         He is allergic to codeine; crestor; hydrocodone-acetaminophen; and rosuvastatin.  Review of Systems  Review of Systems  Constitutional: Negative for activity change, appetite change and fatigue.  HENT: Negative for hearing loss, congestion, tinnitus and ear discharge.   Eyes: Negative for visual disturbance (see optho q1y -- vision corrected to 20/20 with glasses).  Respiratory: Negative for cough, chest tightness and shortness of breath.   Cardiovascular: Negative for chest pain, palpitations and leg swelling.  Gastrointestinal: Negative for abdominal pain, diarrhea, constipation and abdominal distention.  Genitourinary: Negative for urgency, frequency, decreased urine volume and difficulty urinating.  Musculoskeletal: Negative for back pain, arthralgias and gait problem.  Skin: Negative for color change, pallor and rash.  Neurological: Negative for dizziness, light-headedness, numbness and headaches.  Hematological: Negative for adenopathy. Does not bruise/bleed easily.  Psychiatric/Behavioral: Negative for suicidal ideas, confusion, sleep disturbance, self-injury, dysphoric mood, decreased concentration and agitation.  Pt is able to read and write and can do all ADLs No risk for falling No abuse/ violence in home     Objective:     Vision by Snellen chart: opth Blood pressure 130/78, pulse 66, temperature 98.1 F (36.7 C),  temperature source Oral, weight 176 lb (79.833 kg), SpO2 97.00%. There is no height on file to calculate BMI.  BP 130/78  Pulse 66  Temp 98.1 F (36.7 C) (Oral)  Ht 5\' 9"  (1.753 m)  Wt 176 lb (79.833 kg)  BMI 25.99 kg/m2  SpO2 97% General appearance: alert, cooperative, appears stated age and no distress Head: Normocephalic, without obvious abnormality, atraumatic Eyes: conjunctivae/corneas clear. PERRL, EOM's intact. Fundi benign. Ears: normal TM's and external ear canals both ears Nose: Nares normal. Septum midline. Mucosa normal. No drainage or sinus tenderness. Throat: lips, mucosa, and tongue normal; teeth and gums normal Neck: no adenopathy, no carotid bruit, no JVD, supple, symmetrical, trachea midline and thyroid not enlarged, symmetric, no tenderness/mass/nodules Back: symmetric, no curvature. ROM normal. No CVA tenderness. Lungs: clear to auscultation bilaterally Chest wall: no tenderness Heart: regular rate  and rhythm, S1, S2 normal, no murmur, click, rub or gallop Abdomen: soft, non-tender; bowel sounds normal; no masses,  no organomegaly Male genitalia: deferred-urology Rectal: deferred---urology Extremities: extremities normal, atraumatic, no cyanosis or edema-- Pulses: 2+ and symmetric Skin: Skin color, texture, turgor normal. No rashes or lesions Lymph nodes: Cervical, supraclavicular, and axillary nodes normal. Neurologic: Alert and oriented X 3, normal strength and tone. Normal symmetric reflexes. Normal coordination and gait Psych-- no depression, no anxiety     Assessment:     cpe     Plan:     During the course of the visit the patient was educated and counseled about appropriate screening and preventive services including:    Pneumococcal vaccine   Influenza vaccine  Td vaccine  Prostate cancer screening  Colorectal cancer screening  Diabetes screening  Glaucoma screening  Advanced directives: has NO advanced directive - not interested  in additional information  Diet review for nutrition referral? Yes ____  Not Indicated __x__   Patient Instructions (the written plan) was given to the patient.  Medicare Attestation I have personally reviewed: The patient's medical and social history Their use of alcohol, tobacco or illicit drugs Their current medications and supplements The patient's functional ability including ADLs,fall risks, home safety risks, cognitive, and hearing and visual impairment Diet and physical activities Evidence for depression or mood disorders  The patient's weight, height, BMI, and visual acuity have been recorded in the chart.  I have made referrals, counseling, and provided education to the patient based on review of the above and I have provided the patient with a written personalized care plan for preventive services.     Loreen Freud, DO   08/21/2012

## 2012-08-21 NOTE — Patient Instructions (Addendum)
Preventive Care for Adults, Male A healthy lifestyle and preventive care can promote health and wellness. Preventive health guidelines for men include the following key practices:  A routine yearly physical is a good way to check with your caregiver about your health and preventative screening. It is a chance to share any concerns and updates on your health, and to receive a thorough exam.  Visit your dentist for a routine exam and preventative care every 6 months. Brush your teeth twice a day and floss once a day. Good oral hygiene prevents tooth decay and gum disease.  The frequency of eye exams is based on your age, health, family medical history, use of contact lenses, and other factors. Follow your caregiver's recommendations for frequency of eye exams.  Eat a healthy diet. Foods like vegetables, fruits, whole grains, low-fat dairy products, and lean protein foods contain the nutrients you need without too many calories. Decrease your intake of foods high in solid fats, added sugars, and salt. Eat the right amount of calories for you.Get information about a proper diet from your caregiver, if necessary.  Regular physical exercise is one of the most important things you can do for your health. Most adults should get at least 150 minutes of moderate-intensity exercise (any activity that increases your heart rate and causes you to sweat) each week. In addition, most adults need muscle-strengthening exercises on 2 or more days a week.  Maintain a healthy weight. The body mass index (BMI) is a screening tool to identify possible weight problems. It provides an estimate of body fat based on height and weight. Your caregiver can help determine your BMI, and can help you achieve or maintain a healthy weight.For adults 20 years and older:  A BMI below 18.5 is considered underweight.  A BMI of 18.5 to 24.9 is normal.  A BMI of 25 to 29.9 is considered overweight.  A BMI of 30 and above is  considered obese.  Maintain normal blood lipids and cholesterol levels by exercising and minimizing your intake of saturated fat. Eat a balanced diet with plenty of fruit and vegetables. Blood tests for lipids and cholesterol should begin at age 20 and be repeated every 5 years. If your lipid or cholesterol levels are high, you are over 50, or you are a high risk for heart disease, you may need your cholesterol levels checked more frequently.Ongoing high lipid and cholesterol levels should be treated with medicines if diet and exercise are not effective.  If you smoke, find out from your caregiver how to quit. If you do not use tobacco, do not start.  If you choose to drink alcohol, do not exceed 2 drinks per day. One drink is considered to be 12 ounces (355 mL) of beer, 5 ounces (148 mL) of wine, or 1.5 ounces (44 mL) of liquor.  Avoid use of street drugs. Do not share needles with anyone. Ask for help if you need support or instructions about stopping the use of drugs.  High blood pressure causes heart disease and increases the risk of stroke. Your blood pressure should be checked at least every 1 to 2 years. Ongoing high blood pressure should be treated with medicines, if weight loss and exercise are not effective.  If you are 45 to 68 years old, ask your caregiver if you should take aspirin to prevent heart disease.  Diabetes screening involves taking a blood sample to check your fasting blood sugar level. This should be done once every 3 years,   after age 45, if you are within normal weight and without risk factors for diabetes. Testing should be considered at a younger age or be carried out more frequently if you are overweight and have at least 1 risk factor for diabetes.  Colorectal cancer can be detected and often prevented. Most routine colorectal cancer screening begins at the age of 50 and continues through age 75. However, your caregiver may recommend screening at an earlier age if you  have risk factors for colon cancer. On a yearly basis, your caregiver may provide home test kits to check for hidden blood in the stool. Use of a small camera at the end of a tube, to directly examine the colon (sigmoidoscopy or colonoscopy), can detect the earliest forms of colorectal cancer. Talk to your caregiver about this at age 50, when routine screening begins. Direct examination of the colon should be repeated every 5 to 10 years through age 75, unless early forms of pre-cancerous polyps or small growths are found.  Hepatitis C blood testing is recommended for all people born from 1945 through 1965 and any individual with known risks for hepatitis C.  Practice safe sex. Use condoms and avoid high-risk sexual practices to reduce the spread of sexually transmitted infections (STIs). STIs include gonorrhea, chlamydia, syphilis, trichomonas, herpes, HPV, and human immunodeficiency virus (HIV). Herpes, HIV, and HPV are viral illnesses that have no cure. They can result in disability, cancer, and death.  A one-time screening for abdominal aortic aneurysm (AAA) and surgical repair of large AAAs by sound wave imaging (ultrasonography) is recommended for ages 65 to 75 years who are current or former smokers.  Healthy men should no longer receive prostate-specific antigen (PSA) blood tests as part of routine cancer screening. Consult with your caregiver about prostate cancer screening.  Testicular cancer screening is not recommended for adult males who have no symptoms. Screening includes self-exam, caregiver exam, and other screening tests. Consult with your caregiver about any symptoms you have or any concerns you have about testicular cancer.  Use sunscreen with skin protection factor (SPF) of 30 or more. Apply sunscreen liberally and repeatedly throughout the day. You should seek shade when your shadow is shorter than you. Protect yourself by wearing long sleeves, pants, a wide-brimmed hat, and  sunglasses year round, whenever you are outdoors.  Once a month, do a whole body skin exam, using a mirror to look at the skin on your back. Notify your caregiver of new moles, moles that have irregular borders, moles that are larger than a pencil eraser, or moles that have changed in shape or color.  Stay current with required immunizations.  Influenza. You need a dose every fall (or winter). The composition of the flu vaccine changes each year, so being vaccinated once is not enough.  Pneumococcal polysaccharide. You need 1 to 2 doses if you smoke cigarettes or if you have certain chronic medical conditions. You need 1 dose at age 65 (or older) if you have never been vaccinated.  Tetanus, diphtheria, pertussis (Tdap, Td). Get 1 dose of Tdap vaccine if you are younger than age 65 years, are over 65 and have contact with an infant, are a healthcare worker, or simply want to be protected from whooping cough. After that, you need a Td booster dose every 10 years. Consult your caregiver if you have not had at least 3 tetanus and diphtheria-containing shots sometime in your life or have a deep or dirty wound.  HPV. This vaccine is recommended   for males 13 through 68 years of age. This vaccine may be given to men 22 through 68 years of age who have not completed the 3 dose series. It is recommended for men through age 26 who have sex with men or whose immune system is weakened because of HIV infection, other illness, or medications. The vaccine is given in 3 doses over 6 months.  Measles, mumps, rubella (MMR). You need at least 1 dose of MMR if you were born in 1957 or later. You may also need a 2nd dose.  Meningococcal. If you are age 19 to 21 years and a first-year college student living in a residence hall, or have one of several medical conditions, you need to get vaccinated against meningococcal disease. You may also need additional booster doses.  Zoster (shingles). If you are age 60 years or  older, you should get this vaccine.  Varicella (chickenpox). If you have never had chickenpox or you were vaccinated but received only 1 dose, talk to your caregiver to find out if you need this vaccine.  Hepatitis A. You need this vaccine if you have a specific risk factor for hepatitis A virus infection, or you simply wish to be protected from this disease. The vaccine is usually given as 2 doses, 6 to 18 months apart.  Hepatitis B. You need this vaccine if you have a specific risk factor for hepatitis B virus infection or you simply wish to be protected from this disease. The vaccine is given in 3 doses, usually over 6 months. Preventative Service / Frequency Ages 19 to 39  Blood pressure check.** / Every 1 to 2 years.  Lipid and cholesterol check.** / Every 5 years beginning at age 20.  Hepatitis C blood test.** / For any individual with known risks for hepatitis C.  Skin self-exam. / Monthly.  Influenza immunization.** / Every year.  Pneumococcal polysaccharide immunization.** / 1 to 2 doses if you smoke cigarettes or if you have certain chronic medical conditions.  Tetanus, diphtheria, pertussis (Tdap,Td) immunization. / A one-time dose of Tdap vaccine. After that, you need a Td booster dose every 10 years.  HPV immunization. / 3 doses over 6 months, if 26 and younger.  Measles, mumps, rubella (MMR) immunization. / You need at least 1 dose of MMR if you were born in 1957 or later. You may also need a 2nd dose.  Meningococcal immunization. / 1 dose if you are age 19 to 21 years and a first-year college student living in a residence hall, or have one of several medical conditions, you need to get vaccinated against meningococcal disease. You may also need additional booster doses.  Varicella immunization.** / Consult your caregiver.  Hepatitis A immunization.** / Consult your caregiver. 2 doses, 6 to 18 months apart.  Hepatitis B immunization.** / Consult your caregiver. 3 doses  usually over 6 months. Ages 40 to 64  Blood pressure check.** / Every 1 to 2 years.  Lipid and cholesterol check.** / Every 5 years beginning at age 20.  Fecal occult blood test (FOBT) of stool. / Every year beginning at age 50 and continuing until age 75. You may not have to do this test if you get colonoscopy every 10 years.  Flexible sigmoidoscopy** or colonoscopy.** / Every 5 years for a flexible sigmoidoscopy or every 10 years for a colonoscopy beginning at age 50 and continuing until age 75.  Hepatitis C blood test.** / For all people born from 1945 through 1965 and any   individual with known risks for hepatitis C.  Skin self-exam. / Monthly.  Influenza immunization.** / Every year.  Pneumococcal polysaccharide immunization.** / 1 to 2 doses if you smoke cigarettes or if you have certain chronic medical conditions.  Tetanus, diphtheria, pertussis (Tdap/Td) immunization.** / A one-time dose of Tdap vaccine. After that, you need a Td booster dose every 10 years.  Measles, mumps, rubella (MMR) immunization. / You need at least 1 dose of MMR if you were born in 1957 or later. You may also need a 2nd dose.  Varicella immunization.**/ Consult your caregiver.  Meningococcal immunization.** / Consult your caregiver.  Hepatitis A immunization.** / Consult your caregiver. 2 doses, 6 to 18 months apart.  Hepatitis B immunization.** / Consult your caregiver. 3 doses, usually over 6 months. Ages 65 and over  Blood pressure check.** / Every 1 to 2 years.  Lipid and cholesterol check.**/ Every 5 years beginning at age 20.  Fecal occult blood test (FOBT) of stool. / Every year beginning at age 50 and continuing until age 75. You may not have to do this test if you get colonoscopy every 10 years.  Flexible sigmoidoscopy** or colonoscopy.** / Every 5 years for a flexible sigmoidoscopy or every 10 years for a colonoscopy beginning at age 50 and continuing until age 75.  Hepatitis C blood  test.** / For all people born from 1945 through 1965 and any individual with known risks for hepatitis C.  Abdominal aortic aneurysm (AAA) screening.** / A one-time screening for ages 65 to 75 years who are current or former smokers.  Skin self-exam. / Monthly.  Influenza immunization.** / Every year.  Pneumococcal polysaccharide immunization.** / 1 dose at age 65 (or older) if you have never been vaccinated.  Tetanus, diphtheria, pertussis (Tdap, Td) immunization. / A one-time dose of Tdap vaccine if you are over 65 and have contact with an infant, are a healthcare worker, or simply want to be protected from whooping cough. After that, you need a Td booster dose every 10 years.  Varicella immunization. ** / Consult your caregiver.  Meningococcal immunization.** / Consult your caregiver.  Hepatitis A immunization. ** / Consult your caregiver. 2 doses, 6 to 18 months apart.  Hepatitis B immunization.** / Check with your caregiver. 3 doses, usually over 6 months. **Family history and personal history of risk and conditions may change your caregiver's recommendations. Document Released: 11/22/2001 Document Revised: 12/19/2011 Document Reviewed: 02/21/2011 Exit[employer] Patient Information 2013 [employer].  

## 2012-08-21 NOTE — Assessment & Plan Note (Signed)
Check labs stable 

## 2012-08-21 NOTE — Assessment & Plan Note (Signed)
Con;t meds Check labs---pt not fasting --we be done with Dr Juleen China

## 2012-08-28 DIAGNOSIS — E789 Disorder of lipoprotein metabolism, unspecified: Secondary | ICD-10-CM | POA: Diagnosis not present

## 2012-08-28 DIAGNOSIS — D696 Thrombocytopenia, unspecified: Secondary | ICD-10-CM | POA: Diagnosis not present

## 2012-09-05 DIAGNOSIS — E789 Disorder of lipoprotein metabolism, unspecified: Secondary | ICD-10-CM | POA: Diagnosis not present

## 2012-09-05 DIAGNOSIS — I10 Essential (primary) hypertension: Secondary | ICD-10-CM | POA: Diagnosis not present

## 2012-09-05 DIAGNOSIS — E0789 Other specified disorders of thyroid: Secondary | ICD-10-CM | POA: Diagnosis not present

## 2012-09-07 ENCOUNTER — Telehealth: Payer: Self-pay | Admitting: *Deleted

## 2012-09-07 ENCOUNTER — Other Ambulatory Visit (HOSPITAL_BASED_OUTPATIENT_CLINIC_OR_DEPARTMENT_OTHER): Payer: Medicare Other | Admitting: Lab

## 2012-09-07 DIAGNOSIS — D696 Thrombocytopenia, unspecified: Secondary | ICD-10-CM

## 2012-09-07 DIAGNOSIS — E039 Hypothyroidism, unspecified: Secondary | ICD-10-CM | POA: Diagnosis not present

## 2012-09-07 DIAGNOSIS — E119 Type 2 diabetes mellitus without complications: Secondary | ICD-10-CM | POA: Diagnosis not present

## 2012-09-07 DIAGNOSIS — E785 Hyperlipidemia, unspecified: Secondary | ICD-10-CM | POA: Diagnosis not present

## 2012-09-07 LAB — CBC WITH DIFFERENTIAL/PLATELET
BASO%: 0.6 % (ref 0.0–2.0)
Basophils Absolute: 0 10*3/uL (ref 0.0–0.1)
EOS%: 5.7 % (ref 0.0–7.0)
HCT: 45.4 % (ref 38.4–49.9)
LYMPH%: 23 % (ref 14.0–49.0)
MCH: 32.2 pg (ref 27.2–33.4)
MCHC: 36.6 g/dL — ABNORMAL HIGH (ref 32.0–36.0)
NEUT%: 62.4 % (ref 39.0–75.0)
Platelets: 141 10*3/uL (ref 140–400)

## 2012-09-07 NOTE — Telephone Encounter (Signed)
Called patient and let him know that plt. Were now normal and to continue observation.  He appreciated the phone call.

## 2012-09-07 NOTE — Telephone Encounter (Signed)
Message copied by Orbie Hurst on Fri Sep 07, 2012  5:01 PM ------      Message from: Jethro Bolus T      Created: Fri Sep 07, 2012  2:43 PM       Please call pt.  Please inform him that his plt count now has normalized.  Continue observation.  Thanks.

## 2012-09-14 DIAGNOSIS — H52209 Unspecified astigmatism, unspecified eye: Secondary | ICD-10-CM | POA: Diagnosis not present

## 2012-09-14 DIAGNOSIS — H25019 Cortical age-related cataract, unspecified eye: Secondary | ICD-10-CM | POA: Diagnosis not present

## 2012-09-20 LAB — HM DIABETES EYE EXAM

## 2012-09-28 ENCOUNTER — Encounter: Payer: Self-pay | Admitting: Cardiology

## 2012-09-28 ENCOUNTER — Ambulatory Visit (INDEPENDENT_AMBULATORY_CARE_PROVIDER_SITE_OTHER): Payer: Medicare Other | Admitting: Cardiology

## 2012-09-28 VITALS — BP 132/65 | HR 62 | Ht 70.0 in | Wt 177.0 lb

## 2012-09-28 DIAGNOSIS — I10 Essential (primary) hypertension: Secondary | ICD-10-CM | POA: Diagnosis not present

## 2012-09-28 DIAGNOSIS — I2510 Atherosclerotic heart disease of native coronary artery without angina pectoris: Secondary | ICD-10-CM

## 2012-09-28 DIAGNOSIS — E785 Hyperlipidemia, unspecified: Secondary | ICD-10-CM

## 2012-09-28 MED ORDER — LOSARTAN POTASSIUM-HCTZ 50-12.5 MG PO TABS: 1.0000 | ORAL_TABLET | Freq: Every day | ORAL | Status: DC

## 2012-09-28 MED ORDER — METOPROLOL SUCCINATE ER 50 MG PO TB24: 50.0000 mg | ORAL_TABLET | Freq: Every day | ORAL | Status: DC

## 2012-09-28 NOTE — Assessment & Plan Note (Signed)
Patient feels that his statin may be causing myalgias. There was also concern that his Niaspan was contributing to thrombocytopenia. We have elected to discontinue this medication for now. If his myalgias do not improve we will resume his statin.

## 2012-09-28 NOTE — Progress Notes (Signed)
HPI: [name] is a pleasant gentleman who has a history of PCI of his LAD in May 2003. His last cardiac catheterization was performed on December 16, 2008 by Dr. Juanda Chance for exertional chest pain. He was found to have a 30-40% LAD just distal to the stent. There was no other obstructive disease noted. The ejection fraction is 50%. He has been treated medically. Abdominal ultrasound in Oct 2011 showed no aneurysm. I last saw him in October of 2012. Since then the patient denies any dyspnea on exertion, orthopnea, PND, pedal edema, palpitations, syncope or chest pain.     Current Outpatient Prescriptions  Medication Sig Dispense Refill  . aspirin 81 MG tablet Take 81 mg by mouth daily.        . Biotin 1000 MCG tablet Take 1,000 mcg by mouth daily.       . celecoxib (CELEBREX) 200 MG capsule Take 200 mg by mouth daily.       . Cinnamon 500 MG capsule Take 500 mg by mouth 2 (two) times daily.       . Coenzyme Q10 (COQ-10) 100 MG capsule Take 100 mg by mouth 2 (two) times daily.       Marland Kitchen ezetimibe-simvastatin (VYTORIN) 10-20 MG per tablet Take 1 tablet by mouth at bedtime.        . Ginger, Zingiber officinalis, (GINGER ROOT) 550 MG CAPS Take 1 capsule by mouth daily.        Marland Kitchen levothyroxine (SYNTHROID, LEVOTHROID) 112 MCG tablet Take 112 mcg by mouth daily.        Marland Kitchen losartan-hydrochlorothiazide (HYZAAR) 50-12.5 MG per tablet TAKE ONE TABLET BY MOUTH ONE TIME DAILY  30 tablet  1  . metFORMIN (GLUMETZA) 500 MG (MOD) 24 hr tablet Take 1,000 mg by mouth daily.       . metoprolol succinate (TOPROL-XL) 50 MG 24 hr tablet TAKE ONE TABLET BY MOUTH ONE TIME DAILY  30 tablet  9  . multivitamin (THERAGRAN) per tablet Take 1 tablet by mouth daily.        . nitroGLYCERIN (NITROSTAT) 0.4 MG SL tablet Place 0.4 mg under the tongue every 5 (five) minutes as needed.        . NON FORMULARY Take 1 capsule by mouth daily. ( Tumeric 500 mg )       . Omega-3 Fatty Acids (FISH OIL) 1200 MG CAPS Take 2 capsules by mouth 2 (two)  times daily.        . Tamsulosin HCl (FLOMAX) 0.4 MG CAPS Take 0.4 mg by mouth daily.        . traMADol (ULTRAM) 50 MG tablet Take 50 mg by mouth as needed. For headache         Past Medical History  Diagnosis Date  . CAD (coronary artery disease)     s/p PTCI in 2003 and 2004 to the LAD.   . Diabetes mellitus   . Hypertension   . Hyperlipidemia   . Hypothyroid   . Arthritis   . Depression   . Internal hemorrhoids   . Thrombocytopenia   . TIA (transient ischemic attack)     per patient's report.  He was never officially given this diagnosis  . Migraine headache   . Skin cancer, basal cell     Past Surgical History  Procedure Date  . Hernia repair   . Tosillectomy   . Thyroidectomy, partial 07/2002    right thyroid  . Coronary artery disease status post placement  of drug-eluting stent in the LAD in 2003,eEF 65% then  . Right toe bone spur surgery     History   Social History  . Marital Status: Married    Spouse Name: N/A    Number of Children: 0  . Years of Education: N/A   Occupational History  . retired     Airline pilot   Social History Main Topics  . Smoking status: Former Smoker -- 5 years    Types: Cigarettes    Quit date: 10/10/1965  . Smokeless tobacco: Not on file  . Alcohol Use: No  . Drug Use: No  . Sexually Active: Not on file   Other Topics Concern  . Not on file   Social History Narrative  . No narrative on file    ROS: some arthralgias but no fevers or chills, productive cough, hemoptysis, dysphasia, odynophagia, melena, hematochezia, dysuria, hematuria, rash, seizure activity, orthopnea, PND, pedal edema, claudication. Remaining systems are negative.  Physical Exam: Well-developed well-nourished in no acute distress.  Skin is warm and dry.  HEENT is normal.  Neck is supple.  Chest is clear to auscultation with normal expansion.  Cardiovascular exam is regular rate and rhythm.  Abdominal exam nontender or distended. No masses  palpated. Extremities show no edema. neuro grossly intact  ECG sinus rhythm at a rate of 62. No ST changes.

## 2012-09-28 NOTE — Patient Instructions (Addendum)
Your physician wants you to follow-up in: ONE YEAR WITH DR Shelda Pal will receive a reminder letter in the mail two months in advance. If you don't receive a letter, please call our office to schedule the follow-up appointment.   STOP VYTORIN FOR 4 WEEKS=CALL AND LET us KNOW HOW YOU ARE FEELING AFTER 4 WEEKS  Your physician has requested that you have en exercise stress myoview. For further information please visit [email or web address] Please follow instruction sheet, as given.

## 2012-09-28 NOTE — Assessment & Plan Note (Signed)
Continue present blood pressure medications. 

## 2012-09-28 NOTE — Assessment & Plan Note (Signed)
Continue aspirin. Schedule Myoview for risk stratification.

## 2012-10-12 DIAGNOSIS — R972 Elevated prostate specific antigen [PSA]: Secondary | ICD-10-CM | POA: Diagnosis not present

## 2012-10-15 ENCOUNTER — Ambulatory Visit (HOSPITAL_COMMUNITY): Payer: Medicare Other | Attending: Cardiology | Admitting: Radiology

## 2012-10-15 VITALS — BP 146/80 | HR 60 | Ht 70.0 in | Wt 175.0 lb

## 2012-10-15 DIAGNOSIS — Z8249 Family history of ischemic heart disease and other diseases of the circulatory system: Secondary | ICD-10-CM | POA: Insufficient documentation

## 2012-10-15 DIAGNOSIS — I999 Unspecified disorder of circulatory system: Secondary | ICD-10-CM | POA: Diagnosis not present

## 2012-10-15 DIAGNOSIS — I10 Essential (primary) hypertension: Secondary | ICD-10-CM | POA: Diagnosis not present

## 2012-10-15 DIAGNOSIS — E119 Type 2 diabetes mellitus without complications: Secondary | ICD-10-CM | POA: Diagnosis not present

## 2012-10-15 DIAGNOSIS — I2510 Atherosclerotic heart disease of native coronary artery without angina pectoris: Secondary | ICD-10-CM

## 2012-10-15 MED ORDER — TECHNETIUM TC 99M SESTAMIBI GENERIC - CARDIOLITE
11.0000 | Freq: Once | INTRAVENOUS | Status: AC | PRN
  Administered 2012-10-15: 11 via INTRAVENOUS

## 2012-10-15 MED ORDER — TECHNETIUM TC 99M SESTAMIBI GENERIC - CARDIOLITE
33.0000 | Freq: Once | INTRAVENOUS | Status: AC | PRN
  Administered 2012-10-15: 33 via INTRAVENOUS

## 2012-10-15 NOTE — Progress Notes (Signed)
[facility] SITE 3 NUCLEAR MED [address] [place], [place] [address] [phone]    Cardiology Nuclear Med Study  [name] is a 67 y.o. male     MRN : [id number]     DOB: 1946/07/26  Procedure Date: 10/15/2012  Nuclear Med Background Indication for Stress Test:  Evaluation for Ischemia and Stent Patency History:  '04 Stent x 2-LAD; '08 QQV:ZDGLOV, EF=62%; '10 Cath:n/o CAD, EF=50% Cardiac Risk Factors: Family History - CAD, History of Smoking, Hypertension, Lipids, NIDDM and TIA-per patient  Symptoms:  No cardiac complaints.   Nuclear Pre-Procedure Caffeine/Decaff Intake:  None > 12 hrs NPO After: 6:00pm   Lungs:  Clear. O2 Sat: 98% on room air. IV 0.9% NS with Angio Cath:  20g  IV Site: R Antecubital x 1, tolerated well IV Started by:  Irean Hong, RN  Chest Size (in):  38 Cup Size: n/a  Height: 5\' 10"  (1.778 m)  Weight:  175 lb (79.379 kg)  BMI:  Body mass index is 25.11 kg/(m^2). Tech Comments:  Took Toprol at 6:00 pm last night    Nuclear Med Study 1 or 2 day study: 1 day  Stress Test Type:  Stress  Reading MD: Cassell Clement, MD  Order Authorizing Provider:  Olga Millers, MD  Resting Radionuclide: Technetium 32m Sestamibi  Resting Radionuclide Dose: 11.0 mCi   Stress Radionuclide:  Technetium 70m Sestamibi  Stress Radionuclide Dose: 32.8 mCi           Stress Protocol Rest HR: 60 Stress HR: 136  Rest BP: 146/80 Stress BP: 205/81  Exercise Time (min): 9:00 METS: 10.1   Predicted Max HR: 152 bpm % Max HR: 89.47 bpm Rate Pressure Product: 56433    Dose of Adenosine (mg):  n/a Dose of Lexiscan: n/a mg  Dose of Atropine (mg): n/a Dose of Dobutamine: n/a mcg/kg/min (at max HR)  Stress Test Technologist: Smiley Houseman, CMA-N  Nuclear Technologist:  Domenic Polite, CNMT     Rest Procedure:  Myocardial perfusion imaging was performed at rest 45 minutes following the intravenous administration of Technetium 12m Sestamibi.  Rest ECG: NSR -  Normal EKG  Stress Procedure:  The patient exercised on the treadmill utilizing the Bruce Protocol for nine minutes. The patient stopped due to dyspnea and denied any chest pain.  Technetium 61m Sestamibi was injected at peak exercise and myocardial perfusion imaging was performed after a brief delay.  Stress ECG: Significant ST abnormalities consistent with ischemia.  QPS Raw Data Images:  Normal; no motion artifact; normal heart/lung ratio. Stress Images:  No visual evidence of ischemia.  Computer indicates SDS 5 involving basal anteroseptal region. Rest Images:  Normal homogeneous uptake in all areas of the myocardium. Subtraction (SDS):  No evidence of ischemia. Transient Ischemic Dilatation (Normal <1.22):  1.03 Lung/Heart Ratio (Normal <0.45):  0.29  Quantitative Gated Spect Images QGS EDV:  87 ml QGS ESV:  34 ml  Impression Exercise Capacity:  Fair exercise capacity. BP Response:  Hypertensive blood pressure response. Clinical Symptoms:  No chest pain. ECG Impression:  Significant ST abnormalities consistent with ischemia. Comparison with Prior Nuclear Study: No images to compare  Overall Impression:  Intermediate stress nuclear study. Positive EKG changes consistent with ischemia although LV strain from hypertensive response could also be playing a role.  SDS of 5 involving basal septum although visually not impressive. Normal LV function with no wall motion abnormalities.  LV Ejection Fraction: 61%.  LV Wall Motion:  NL LV Function;  NL Wall Motion   [employer]

## 2012-10-19 DIAGNOSIS — R972 Elevated prostate specific antigen [PSA]: Secondary | ICD-10-CM | POA: Diagnosis not present

## 2012-10-19 DIAGNOSIS — N529 Male erectile dysfunction, unspecified: Secondary | ICD-10-CM | POA: Diagnosis not present

## 2012-10-19 DIAGNOSIS — N401 Enlarged prostate with lower urinary tract symptoms: Secondary | ICD-10-CM | POA: Diagnosis not present

## 2012-10-26 ENCOUNTER — Telehealth: Payer: Self-pay | Admitting: Cardiology

## 2012-10-26 NOTE — Telephone Encounter (Signed)
Spoke with [name], since he has stopped the vytorin he is doing much better. He is scheduled to see dr Juleen China for blood work and will make sure we get a copy. He would like to wait until the blood work is complete before discussing any other statins.

## 2012-10-26 NOTE — Telephone Encounter (Signed)
Pt is reporting back regarding vytorin and he is doing great

## 2013-01-03 DIAGNOSIS — I2510 Atherosclerotic heart disease of native coronary artery without angina pectoris: Secondary | ICD-10-CM | POA: Diagnosis not present

## 2013-01-03 DIAGNOSIS — E789 Disorder of lipoprotein metabolism, unspecified: Secondary | ICD-10-CM | POA: Diagnosis not present

## 2013-01-07 ENCOUNTER — Telehealth: Payer: Self-pay | Admitting: Cardiology

## 2013-01-07 ENCOUNTER — Other Ambulatory Visit (HOSPITAL_BASED_OUTPATIENT_CLINIC_OR_DEPARTMENT_OTHER): Payer: Medicare Other | Admitting: Lab

## 2013-01-07 ENCOUNTER — Telehealth: Payer: Self-pay | Admitting: *Deleted

## 2013-01-07 DIAGNOSIS — E119 Type 2 diabetes mellitus without complications: Secondary | ICD-10-CM

## 2013-01-07 DIAGNOSIS — E039 Hypothyroidism, unspecified: Secondary | ICD-10-CM

## 2013-01-07 DIAGNOSIS — E785 Hyperlipidemia, unspecified: Secondary | ICD-10-CM

## 2013-01-07 DIAGNOSIS — D696 Thrombocytopenia, unspecified: Secondary | ICD-10-CM

## 2013-01-07 LAB — CBC WITH DIFFERENTIAL/PLATELET
BASO%: 0.3 % (ref 0.0–2.0)
Basophils Absolute: 0 10*3/uL (ref 0.0–0.1)
EOS%: 3 % (ref 0.0–7.0)
HCT: 47.1 % (ref 38.4–49.9)
HGB: 16.9 g/dL (ref 13.0–17.1)
MCH: 32.1 pg (ref 27.2–33.4)
MCHC: 35.8 g/dL (ref 32.0–36.0)
MCV: 89.6 fL (ref 79.3–98.0)
MONO%: 8.2 % (ref 0.0–14.0)
NEUT%: 67.9 % (ref 39.0–75.0)
RDW: 12.8 % (ref 11.0–14.6)

## 2013-01-07 NOTE — Telephone Encounter (Signed)
Walk In pt form " pt Dropped off Records" For Debra Review   01/07/13/KM

## 2013-01-07 NOTE — Telephone Encounter (Signed)
Message copied by Wende Mott on Mon Jan 07, 2013  2:43 PM ------      Message from: HA, Raliegh Ip T      Created: Mon Jan 07, 2013  1:53 PM       Please call pt.  His plt continues to be normal.  Continue observation.  See Belenda Cruise in July 2014.  Thanks. ------

## 2013-01-07 NOTE — Telephone Encounter (Signed)
Called pt w/ lab results and informed Dr. Gaylyn Rong says platelets continue to be normal.  Keep next appt as scheduled in July.  Pt verbalized understanding.

## 2013-02-05 DIAGNOSIS — D235 Other benign neoplasm of skin of trunk: Secondary | ICD-10-CM | POA: Diagnosis not present

## 2013-02-05 DIAGNOSIS — L578 Other skin changes due to chronic exposure to nonionizing radiation: Secondary | ICD-10-CM | POA: Diagnosis not present

## 2013-02-13 ENCOUNTER — Telehealth: Payer: Self-pay | Admitting: Oncology

## 2013-04-18 ENCOUNTER — Other Ambulatory Visit: Payer: Self-pay

## 2013-05-07 ENCOUNTER — Other Ambulatory Visit: Payer: Self-pay | Admitting: Oncology

## 2013-05-07 DIAGNOSIS — D696 Thrombocytopenia, unspecified: Secondary | ICD-10-CM

## 2013-05-08 ENCOUNTER — Ambulatory Visit (HOSPITAL_BASED_OUTPATIENT_CLINIC_OR_DEPARTMENT_OTHER): Payer: Medicare Other | Admitting: Oncology

## 2013-05-08 ENCOUNTER — Other Ambulatory Visit (HOSPITAL_BASED_OUTPATIENT_CLINIC_OR_DEPARTMENT_OTHER): Payer: Medicare Other | Admitting: Lab

## 2013-05-08 ENCOUNTER — Encounter: Payer: Self-pay | Admitting: Oncology

## 2013-05-08 VITALS — BP 136/65 | HR 71 | Temp 97.6°F | Resp 18 | Ht 70.0 in | Wt 173.1 lb

## 2013-05-08 DIAGNOSIS — D696 Thrombocytopenia, unspecified: Secondary | ICD-10-CM

## 2013-05-08 LAB — CBC WITH DIFFERENTIAL/PLATELET
BASO%: 0.8 % (ref 0.0–2.0)
EOS%: 4.3 % (ref 0.0–7.0)
LYMPH%: 20.6 % (ref 14.0–49.0)
MCH: 32.6 pg (ref 27.2–33.4)
MCHC: 35.6 g/dL (ref 32.0–36.0)
MCV: 91.6 fL (ref 79.3–98.0)
MONO%: 7.1 % (ref 0.0–14.0)
Platelets: 132 10*3/uL — ABNORMAL LOW (ref 140–400)
RBC: 5.12 10*6/uL (ref 4.20–5.82)

## 2013-05-08 LAB — COMPREHENSIVE METABOLIC PANEL (CC13)
ALT: 23 U/L (ref 0–55)
AST: 22 U/L (ref 5–34)
Alkaline Phosphatase: 61 U/L (ref 40–150)
Glucose: 146 mg/dl — ABNORMAL HIGH (ref 70–140)
Sodium: 141 mEq/L (ref 136–145)
Total Bilirubin: 0.98 mg/dL (ref 0.20–1.20)
Total Protein: 6.8 g/dL (ref 6.4–8.3)

## 2013-05-08 LAB — MORPHOLOGY: RBC Comments: NORMAL

## 2013-05-08 NOTE — Progress Notes (Signed)
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   CURRENT THERAPY:  Watchful observation.   INTERVAL HISTORY: [name] 67 y.o. male returns for regular follow up by himself.  He reports feeling well. He is being treated for precancerous skin lesions per Dermatology. He does not normally use sunscreen or avoid the sun.  He has many silvery, flaky spots on his skin. He has chronic stable back and joint pain.  Pain is worst when he first wakes up and gets active; pain improves with activity.  He does not need chronic pain meds.  He denied lower extremity weakness, leg numbness, tingling, bowel/bladder incontinence.   Patient denies fever, anorexia, weight loss, fatigue, headache, visual changes, confusion, drenching night sweats, palpable lymph node swelling, mucositis, odynophagia, dysphagia, nausea vomiting, jaundice, chest pain, palpitation, shortness of breath, dyspnea on exertion, productive cough, gum bleeding, epistaxis, hematemesis, hemoptysis, abdominal pain, abdominal swelling, early satiety, melena, hematochezia, hematuria, spontaneous bleeding, joint swelling, heat or cold intolerance, focal motor weakness, paresthesia.    Past Medical History  Diagnosis Date  . CAD (coronary artery disease)     s/p PTCI in 2003 and 2004 to the LAD.   . Diabetes mellitus   . Hypertension   . Hyperlipidemia   . Hypothyroid   . Arthritis   . Depression   . Internal hemorrhoids   . Thrombocytopenia   . TIA (transient ischemic attack)     per patient's report.  He was never officially given this diagnosis  . Migraine headache   . Skin cancer, basal cell   . Thrombocytopenia, unspecified 05/08/2013    Past Surgical History  Procedure Laterality Date  . Hernia repair    . Tosillectomy    . Thyroidectomy, partial   07/2002    right thyroid  . Coronary artery disease status post placement      of drug-eluting stent in the LAD in 2003,eEF 65% then  . Right toe bone spur surgery      Current Outpatient Prescriptions  Medication Sig Dispense Refill  . Diclofenac Sodium (SOLARAZE) 3 % GEL Place 1 application onto the skin 2 (two) times daily.      . Policosanol 10 MG CAPS Take 10 mg by mouth daily.      Marland Kitchen aspirin 81 MG tablet Take 81 mg by mouth daily.        . Biotin 1000 MCG tablet Take 1,000 mcg by mouth daily.       . celecoxib (CELEBREX) 200 MG capsule Take 200 mg by mouth daily as needed.       . Cinnamon 500 MG capsule Take 500 mg by mouth 2 (two) times daily.       . Coenzyme Q10 (COQ-10) 100 MG capsule Take 100 mg by mouth 2 (two) times daily.       Marland Kitchen ezetimibe-simvastatin (VYTORIN) 10-20 MG per tablet Take 1 tablet by mouth at bedtime.        . Ginger, Zingiber officinalis, (GINGER ROOT) 550 MG CAPS Take 1 capsule by mouth daily.        Marland Kitchen levothyroxine (SYNTHROID, LEVOTHROID) 112 MCG tablet Take 112 mcg by mouth daily.        Marland Kitchen losartan-hydrochlorothiazide (HYZAAR) 50-12.5 MG per tablet Take 1 tablet by mouth daily.  30 tablet  12  . metFORMIN (GLUMETZA) 500 MG (MOD)  24 hr tablet Take 1,000 mg by mouth daily.       . metoprolol succinate (TOPROL-XL) 50 MG 24 hr tablet Take 1 tablet (50 mg total) by mouth daily. Take with or immediately following a meal.  30 tablet  12  . multivitamin (THERAGRAN) per tablet Take 1 tablet by mouth daily.        . nitroGLYCERIN (NITROSTAT) 0.4 MG SL tablet Place 0.4 mg under the tongue every 5 (five) minutes as needed.        . NON FORMULARY Take 1 capsule by mouth daily. ( Tumeric 500 mg )       . Omega-3 Fatty Acids (FISH OIL) 1200 MG CAPS Take 2 capsules by mouth 2 (two) times daily.        . Tamsulosin HCl (FLOMAX) 0.4 MG CAPS Take 0.4 mg by mouth daily.        . traMADol (ULTRAM) 50 MG tablet Take 50 mg by mouth as needed. For headache       No current  facility-administered medications for this visit.    ALLERGIES:  is allergic to codeine; crestor; hydrocodone-acetaminophen; and rosuvastatin.  REVIEW OF SYSTEMS:  The rest of the 14-point review of system was negative.   Filed Vitals:   05/08/13 0927  BP: 136/65  Pulse: 71  Temp: 97.6 F (36.4 C)  Resp: 18   Wt Readings from Last 3 Encounters:  05/08/13 173 lb 1.6 oz (78.518 kg)  10/15/12 175 lb (79.379 kg)  09/28/12 177 lb (80.287 kg)   ECOG Performance status: 0  PHYSICAL EXAMINATION:  General:  well-nourished man, in no acute distress.  Eyes:  no scleral icterus.  ENT:  There were no oropharyngeal lesions.  Neck was without thyromegaly.  Lymphatics:  Negative cervical, supraclavicular or axillary adenopathy.  Respiratory: lungs were clear bilaterally without wheezing or crackles.  Cardiovascular:  Regular rate and rhythm, S1/S2, without murmur, rub or gallop.  There was no pedal edema.  GI:  abdomen was soft, flat, nontender, nondistended, without organomegaly.  Muscoloskeletal:  no spinal tenderness of palpation of vertebral spine.  Skin exam was without echymosis, petichae.  There were many dry, silvery, flaky skin areas on the extensor, sun-exposed surfaces of his arms.  Neuro exam was nonfocal.  Patient was able to get on and off exam table without assistance.  Gait was normal.  Patient was alerted and oriented.  Attention was good.   Language was appropriate.  Mood was normal without depression.  Speech was not pressured.  Thought content was not tangential.    LABORATORY/RADIOLOGY DATA:  Lab Results  Component Value Date   WBC 5.0 05/08/2013   HGB 16.7 05/08/2013   HCT 46.9 05/08/2013   PLT 132* 05/08/2013   GLUCOSE 146* 05/08/2013   CHOL 104 07/26/2011   TRIG 33.0 07/26/2011   HDL 59.10 07/26/2011   LDLCALC 38 07/26/2011   ALKPHOS 61 05/08/2013   ALT 23 05/08/2013   AST 22 05/08/2013   NA 141 05/08/2013   K 4.1 05/08/2013   CL 102 08/21/2012   CREATININE 1.3 05/08/2013    BUN 22.4 05/08/2013   CO2 27 05/08/2013   PSA 2.14 04/14/2010   INR 1.0 RATIO 12/11/2008   MICROALBUR 1.8 08/21/2012    ASSESSMENT AND PLAN:   1. Diabetes mellitus, type II: He is on metformin per PCP.  2. HLP: He is on Vytorin per PCP.  3. Hypothryoidism: He is on Synthroid per PCP.  4. CAD history: He is  on ASA, Vytorin, Metroprolol per PCP.  5. Thrombocytopenia   -  Potential causes: Hypothyroidism and the use of cholesterol medication.  I cannot rule out MDS and other bone marrow failure states.  However, given his very mild thrombocytopenia (isolating) without leukopenia or anemia, a bone marrow at this time is of low clinical utility.  -  Treatment: I therefore recommend to continue watchful observation this time. In the future, if platelet count significantly decreases in or he develops also leukopenia and anemia, I may consider diagnostic bone marrow biopsy at that time. - Followup: Discussed follow-up with [place] here, but patient prefers to follow-up with PCP with referral back to [place] if platelet count drops. Recommend CBC every 6 months with referral back to [place] for Plt < 100,000 or the development of leukopenia or anemia.   The length of time of the face-to-face encounter was 15 minutes. More than 50% of time was spent counseling and coordination of care.

## 2013-05-09 ENCOUNTER — Ambulatory Visit: Payer: Medicare Other | Admitting: Oncology

## 2013-05-09 ENCOUNTER — Other Ambulatory Visit: Payer: Medicare Other | Admitting: Lab

## 2013-05-13 DIAGNOSIS — M545 Low back pain: Secondary | ICD-10-CM | POA: Diagnosis not present

## 2013-05-17 DIAGNOSIS — M5126 Other intervertebral disc displacement, lumbar region: Secondary | ICD-10-CM | POA: Diagnosis not present

## 2013-05-17 DIAGNOSIS — M545 Low back pain: Secondary | ICD-10-CM | POA: Diagnosis not present

## 2013-05-21 DIAGNOSIS — R69 Illness, unspecified: Secondary | ICD-10-CM | POA: Diagnosis not present

## 2013-05-21 DIAGNOSIS — D485 Neoplasm of uncertain behavior of skin: Secondary | ICD-10-CM | POA: Diagnosis not present

## 2013-05-21 DIAGNOSIS — L280 Lichen simplex chronicus: Secondary | ICD-10-CM | POA: Diagnosis not present

## 2013-05-29 DIAGNOSIS — E789 Disorder of lipoprotein metabolism, unspecified: Secondary | ICD-10-CM | POA: Diagnosis not present

## 2013-06-05 DIAGNOSIS — IMO0002 Reserved for concepts with insufficient information to code with codable children: Secondary | ICD-10-CM | POA: Diagnosis not present

## 2013-06-05 DIAGNOSIS — E119 Type 2 diabetes mellitus without complications: Secondary | ICD-10-CM | POA: Diagnosis not present

## 2013-06-05 DIAGNOSIS — I2510 Atherosclerotic heart disease of native coronary artery without angina pectoris: Secondary | ICD-10-CM | POA: Diagnosis not present

## 2013-06-05 DIAGNOSIS — E789 Disorder of lipoprotein metabolism, unspecified: Secondary | ICD-10-CM | POA: Diagnosis not present

## 2013-07-05 DIAGNOSIS — Z23 Encounter for immunization: Secondary | ICD-10-CM | POA: Diagnosis not present

## 2013-08-22 ENCOUNTER — Encounter: Payer: Medicare Other | Admitting: Family Medicine

## 2013-08-26 ENCOUNTER — Telehealth: Payer: Self-pay

## 2013-08-26 NOTE — Telephone Encounter (Addendum)
Topic  Date Due   .  Tetanus/tdap  02/2013  .  Influenza Vaccine  07/04/2013  .  Colonoscopy  07/29/2015   .  Pneumococcal Polysaccharide Vaccine Age 69 And Over  07/2009,10/2002  .  Zostavax  Completed   Medication List and allergies: reviewed and updated  90 day supply/mail order: na Local prescriptions: [facility]  Immunizations due: UTD  A/P:   No changes to FH or PSH microalbumin--08/2012 A1c--05/2012 Foot exam--08/2012.sees urology--last OV 0/2014 Eye exam--scheduled in Dec  To Discuss with Provider: PNA vaccines Will bring labs from oncology

## 2013-08-27 ENCOUNTER — Encounter: Payer: Self-pay | Admitting: Family Medicine

## 2013-08-27 ENCOUNTER — Ambulatory Visit (INDEPENDENT_AMBULATORY_CARE_PROVIDER_SITE_OTHER): Payer: Medicare Other | Admitting: Family Medicine

## 2013-08-27 VITALS — BP 122/80 | HR 73 | Temp 97.9°F | Ht 69.0 in | Wt 177.0 lb

## 2013-08-27 DIAGNOSIS — Z0000 Encounter for general adult medical examination without abnormal findings: Secondary | ICD-10-CM | POA: Diagnosis not present

## 2013-08-27 DIAGNOSIS — I10 Essential (primary) hypertension: Secondary | ICD-10-CM

## 2013-08-27 DIAGNOSIS — E119 Type 2 diabetes mellitus without complications: Secondary | ICD-10-CM

## 2013-08-27 DIAGNOSIS — E785 Hyperlipidemia, unspecified: Secondary | ICD-10-CM

## 2013-08-27 DIAGNOSIS — I2510 Atherosclerotic heart disease of native coronary artery without angina pectoris: Secondary | ICD-10-CM

## 2013-08-27 NOTE — Assessment & Plan Note (Signed)
Stable con't meds 

## 2013-08-27 NOTE — Patient Instructions (Signed)
 Preventive Care for Adults, Male A healthy lifestyle and preventive care can promote health and wellness. Preventive health guidelines for men include the following key practices:  A routine yearly physical is a good way to check with your caregiver about your health and preventative screening. It is a chance to share any concerns and updates on your health, and to receive a thorough exam.  Visit your dentist for a routine exam and preventative care every 6 months. Brush your teeth twice a day and floss once a day. Good oral hygiene prevents tooth decay and gum disease.  The frequency of eye exams is based on your age, health, family medical history, use of contact lenses, and other factors. Follow your caregiver's recommendations for frequency of eye exams.  Eat a healthy diet. Foods like vegetables, fruits, whole grains, low-fat dairy products, and lean protein foods contain the nutrients you need without too many calories. Decrease your intake of foods high in solid fats, added sugars, and salt. Eat the right amount of calories for you.Get information about a proper diet from your caregiver, if necessary.  Regular physical exercise is one of the most important things you can do for your health. Most adults should get at least 150 minutes of moderate-intensity exercise (any activity that increases your heart rate and causes you to sweat) each week. In addition, most adults need muscle-strengthening exercises on 2 or more days a week.  Maintain a healthy weight. The body mass index (BMI) is a screening tool to identify possible weight problems. It provides an estimate of body fat based on height and weight. Your caregiver can help determine your BMI, and can help you achieve or maintain a healthy weight.For adults 20 years and older:  A BMI below 18.5 is considered underweight.  A BMI of 18.5 to 24.9 is normal.  A BMI of 25 to 29.9 is considered overweight.  A BMI of 30 and above is  considered obese.  Maintain normal blood lipids and cholesterol levels by exercising and minimizing your intake of saturated fat. Eat a balanced diet with plenty of fruit and vegetables. Blood tests for lipids and cholesterol should begin at age 20 and be repeated every 5 years. If your lipid or cholesterol levels are high, you are over 50, or you are a high risk for heart disease, you may need your cholesterol levels checked more frequently.Ongoing high lipid and cholesterol levels should be treated with medicines if diet and exercise are not effective.  If you smoke, find out from your caregiver how to quit. If you do not use tobacco, do not start.  Lung cancer screening is recommended for adults aged 55 80 years who are at high risk for developing lung cancer because of a history of smoking. Yearly low-dose computed tomography (CT) is recommended for people who have at least a 30-pack-year history of smoking and are a current smoker or have quit within the past 15 years. A pack year of smoking is smoking an average of 1 pack of cigarettes a day for 1 year (for example: 1 pack a day for 30 years or 2 packs a day for 15 years). Yearly screening should continue until the smoker has stopped smoking for at least 15 years. Yearly screening should also be stopped for people who develop a health problem that would prevent them from having lung cancer treatment.  If you choose to drink alcohol, do not exceed 2 drinks per day. One drink is considered to be 12   ounces (355 mL) of beer, 5 ounces (148 mL) of wine, or 1.5 ounces (44 mL) of liquor.  Avoid use of street drugs. Do not share needles with anyone. Ask for help if you need support or instructions about stopping the use of drugs.  High blood pressure causes heart disease and increases the risk of stroke. Your blood pressure should be checked at least every 1 to 2 years. Ongoing high blood pressure should be treated with medicines, if weight loss and  exercise are not effective.  If you are 45 to 69 years old, ask your caregiver if you should take aspirin to prevent heart disease.  Diabetes screening involves taking a blood sample to check your fasting blood sugar level. This should be done once every 3 years, after age 45, if you are within normal weight and without risk factors for diabetes. Testing should be considered at a younger age or be carried out more frequently if you are overweight and have at least 1 risk factor for diabetes.  Colorectal cancer can be detected and often prevented. Most routine colorectal cancer screening begins at the age of 50 and continues through age 75. However, your caregiver may recommend screening at an earlier age if you have risk factors for colon cancer. On a yearly basis, your caregiver may provide home test kits to check for hidden blood in the stool. Use of a small camera at the end of a tube, to directly examine the colon (sigmoidoscopy or colonoscopy), can detect the earliest forms of colorectal cancer. Talk to your caregiver about this at age 50, when routine screening begins. Direct examination of the colon should be repeated every 5 to 10 years through age 75, unless early forms of pre-cancerous polyps or small growths are found.  Hepatitis C blood testing is recommended for all people born from 1945 through 1965 and any individual with known risks for hepatitis C.  Practice safe sex. Use condoms and avoid high-risk sexual practices to reduce the spread of sexually transmitted infections (STIs). STIs include gonorrhea, chlamydia, syphilis, trichomonas, herpes, HPV, and human immunodeficiency virus (HIV). Herpes, HIV, and HPV are viral illnesses that have no cure. They can result in disability, cancer, and death.  A one-time screening for abdominal aortic aneurysm (AAA) and surgical repair of large AAAs by sound wave imaging (ultrasonography) is recommended for ages 65 to 75 years who are current or  former smokers.  Healthy men should no longer receive prostate-specific antigen (PSA) blood tests as part of routine cancer screening. Consult with your caregiver about prostate cancer screening.  Testicular cancer screening is not recommended for adult males who have no symptoms. Screening includes self-exam, caregiver exam, and other screening tests. Consult with your caregiver about any symptoms you have or any concerns you have about testicular cancer.  Use sunscreen. Apply sunscreen liberally and repeatedly throughout the day. You should seek shade when your shadow is shorter than you. Protect yourself by wearing long sleeves, pants, a wide-brimmed hat, and sunglasses year round, whenever you are outdoors.  Once a month, do a whole body skin exam, using a mirror to look at the skin on your back. Notify your caregiver of new moles, moles that have irregular borders, moles that are larger than a pencil eraser, or moles that have changed in shape or color.  Stay current with required immunizations.  Influenza vaccine. All adults should be immunized every year.  Tetanus, diphtheria, and acellular pertussis (Td, Tdap) vaccine. An adult who has not   previously received Tdap or who does not know his vaccine status should receive 1 dose of Tdap. This initial dose should be followed by tetanus and diphtheria toxoids (Td) booster doses every 10 years. Adults with an unknown or incomplete history of completing a 3-dose immunization series with Td-containing vaccines should begin or complete a primary immunization series including a Tdap dose. Adults should receive a Td booster every 10 years.  Varicella vaccine. An adult without evidence of immunity to varicella should receive 2 doses or a second dose if he has previously received 1 dose.  Human papillomavirus (HPV) vaccine. Males aged 13 21 years who have not received the vaccine previously should receive the 3-dose series. Males aged 22 26 years may be  immunized. Immunization is recommended through the age of 26 years for any male who has sex with males and did not get any or all doses earlier. Immunization is recommended for any person with an immunocompromised condition through the age of 26 years if he did not get any or all doses earlier. During the 3-dose series, the second dose should be obtained 4 8 weeks after the first dose. The third dose should be obtained 24 weeks after the first dose and 16 weeks after the second dose.  Zoster vaccine. One dose is recommended for adults aged 60 years or older unless certain conditions are present.  Measles, mumps, and rubella (MMR) vaccine. Adults born before 1957 generally are considered immune to measles and mumps. Adults born in 1957 or later should have 1 or more doses of MMR vaccine unless there is a contraindication to the vaccine or there is laboratory evidence of immunity to each of the three diseases. A routine second dose of MMR vaccine should be obtained at least 28 days after the first dose for students attending postsecondary schools, health care workers, or international travelers. People who received inactivated measles vaccine or an unknown type of measles vaccine during 1963 1967 should receive 2 doses of MMR vaccine. People who received inactivated mumps vaccine or an unknown type of mumps vaccine before 1979 and are at high risk for mumps infection should consider immunization with 2 doses of MMR vaccine. Unvaccinated health care workers born before 1957 who lack laboratory evidence of measles, mumps, or rubella immunity or laboratory confirmation of disease should consider measles and mumps immunization with 2 doses of MMR vaccine or rubella immunization with 1 dose of MMR vaccine.  Pneumococcal 13-valent conjugate (PCV13) vaccine. When indicated, a person who is uncertain of his immunization history and has no record of immunization should receive the PCV13 vaccine. An adult aged 19 years or  older who has certain medical conditions and has not been previously immunized should receive 1 dose of PCV13 vaccine. This PCV13 should be followed with a dose of pneumococcal polysaccharide (PPSV23) vaccine. The PPSV23 vaccine dose should be obtained at least 8 weeks after the dose of PCV13 vaccine. An adult aged 19 years or older who has certain medical conditions and previously received 1 or more doses of PPSV23 vaccine should receive 1 dose of PCV13. The PCV13 vaccine dose should be obtained 1 or more years after the last PPSV23 vaccine dose.  Pneumococcal polysaccharide (PPSV23) vaccine. When PCV13 is also indicated, PCV13 should be obtained first. All adults aged 65 years and older should be immunized. An adult younger than age 65 years who has certain medical conditions should be immunized. Any person who resides in a nursing home or long-term care facility should be   immunized. An adult smoker should be immunized. People with an immunocompromised condition and certain other conditions should receive both PCV13 and PPSV23 vaccines. People with human immunodeficiency virus (HIV) infection should be immunized as soon as possible after diagnosis. Immunization during chemotherapy or radiation therapy should be avoided. Routine use of PPSV23 vaccine is not recommended for American Indians, [place] Natives, or people younger than 65 years unless there are medical conditions that require PPSV23 vaccine. When indicated, people who have unknown immunization and have no record of immunization should receive PPSV23 vaccine. One-time revaccination 5 years after the first dose of PPSV23 is recommended for people aged 19 64 years who have chronic kidney failure, nephrotic syndrome, asplenia, or immunocompromised conditions. People who received 1 2 doses of PPSV23 before age 65 years should receive another dose of PPSV23 vaccine at age 65 years or later if at least 5 years have passed since the previous dose. Doses of  PPSV23 are not needed for people immunized with PPSV23 at or after age 65 years.  Meningococcal vaccine. Adults with asplenia or persistent complement component deficiencies should receive 2 doses of quadrivalent meningococcal conjugate (MenACWY-D) vaccine. The doses should be obtained at least 2 months apart. Microbiologists working with certain meningococcal bacteria, military recruits, people at risk during an outbreak, and people who travel to or live in countries with a high rate of meningitis should be immunized. A first-year college student up through age 21 years who is living in a residence hall should receive a dose if he did not receive a dose on or after his 16th birthday. Adults who have certain high-risk conditions should receive one or more doses of vaccine.  Hepatitis A vaccine. Adults who wish to be protected from this disease, have certain high-risk conditions, work with hepatitis A-infected animals, work in hepatitis A research labs, or travel to or work in countries with a high rate of hepatitis A should be immunized. Adults who were previously unvaccinated and who anticipate close contact with an international adoptee during the first 60 days after arrival in the [place] from a country with a high rate of hepatitis A should be immunized.  Hepatitis B vaccine. Adults who wish to be protected from this disease, have certain high-risk conditions, may be exposed to blood or other infectious body fluids, are household contacts or sex partners of hepatitis B positive people, are clients or workers in certain care facilities, or travel to or work in countries with a high rate of hepatitis B should be immunized.  Haemophilus influenzae type b (Hib) vaccine. A previously unvaccinated person with asplenia or sickle cell disease or having a scheduled splenectomy should receive 1 dose of Hib vaccine. Regardless of previous immunization, a recipient of a hematopoietic stem cell transplant  should receive a 3-dose series 6 12 months after his successful transplant. Hib vaccine is not recommended for adults with HIV infection. Preventive Service / Frequency Ages 19 to 39  Blood pressure check.** / Every 1 to 2 years.  Lipid and cholesterol check.** / Every 5 years beginning at age 20.  Hepatitis C blood test.** / For any individual with known risks for hepatitis C.  Skin self-exam. / Monthly.  Influenza vaccine. / Every year.  Tetanus, diphtheria, and acellular pertussis (Tdap, Td) vaccine.** / Consult your caregiver. 1 dose of Td every 10 years.  Varicella vaccine.** / Consult your caregiver.  HPV vaccine. / 3 doses over 6 months, if 26 and younger.  Measles, mumps, rubella (MMR) vaccine.** /   You need at least 1 dose of MMR if you were born in 1957 or later. You may also need a 2nd dose.  Pneumococcal 13-valent conjugate (PCV13) vaccine.** / Consult your caregiver.  Pneumococcal polysaccharide (PPSV23) vaccine.** / 1 to 2 doses if you smoke cigarettes or if you have certain conditions.  Meningococcal vaccine.** / 1 dose if you are age 19 to 21 years and a first-year college student living in a residence hall, or have one of several medical conditions, you need to get vaccinated against meningococcal disease. You may also need additional booster doses.  Hepatitis A vaccine.** / Consult your caregiver.  Hepatitis B vaccine.** / Consult your caregiver.  Haemophilus influenzae type b (Hib) vaccine.** / Consult your caregiver. Ages 40 to 64  Blood pressure check.** / Every 1 to 2 years.  Lipid and cholesterol check.** / Every 5 years beginning at age 20.  Lung cancer screening. / Every year if you are aged 55 80 years and have a 30-pack-year history of smoking and currently smoke or have quit within the past 15 years. Yearly screening is stopped once you have quit smoking for at least 15 years or develop a health problem that would prevent you from having lung cancer  treatment.  Fecal occult blood test (FOBT) of stool. / Every year beginning at age 50 and continuing until age 75. You may not have to do this test if you get colonoscopy every 10 years.  Flexible sigmoidoscopy** or colonoscopy.** / Every 5 years for a flexible sigmoidoscopy or every 10 years for a colonoscopy beginning at age 50 and continuing until age 75.  Hepatitis C blood test.** / For all people born from 1945 through 1965 and any individual with known risks for hepatitis C.  Skin self-exam. / Monthly.  Influenza vaccine. / Every year.  Tetanus, diphtheria, and acellular pertussis (Tdap/Td) vaccine.** / Consult your caregiver. 1 dose of Td every 10 years.  Varicella vaccine.** / Consult your caregiver.  Zoster vaccine.** / 1 dose for adults aged 60 years or older.  Measles, mumps, rubella (MMR) vaccine.** / You need at least 1 dose of MMR if you were born in 1957 or later. You may also need a 2nd dose.  Pneumococcal 13-valent conjugate (PCV13) vaccine.** / Consult your caregiver.  Pneumococcal polysaccharide (PPSV23) vaccine.** / 1 to 2 doses if you smoke cigarettes or if you have certain conditions.  Meningococcal vaccine.** / Consult your caregiver.  Hepatitis A vaccine.** / Consult your caregiver.  Hepatitis B vaccine.** / Consult your caregiver.  Haemophilus influenzae type b (Hib) vaccine.** / Consult your caregiver. Ages 65 and over  Blood pressure check.** / Every 1 to 2 years.  Lipid and cholesterol check.**/ Every 5 years beginning at age 20.  Lung cancer screening. / Every year if you are aged 55 80 years and have a 30-pack-year history of smoking and currently smoke or have quit within the past 15 years. Yearly screening is stopped once you have quit smoking for at least 15 years or develop a health problem that would prevent you from having lung cancer treatment.  Fecal occult blood test (FOBT) of stool. / Every year beginning at age 50 and continuing until  age 75. You may not have to do this test if you get colonoscopy every 10 years.  Flexible sigmoidoscopy** or colonoscopy.** / Every 5 years for a flexible sigmoidoscopy or every 10 years for a colonoscopy beginning at age 50 and continuing until age 75.  Hepatitis C blood   test.** / For all people born from 1945 through 1965 and any individual with known risks for hepatitis C.  Abdominal aortic aneurysm (AAA) screening.** / A one-time screening for ages 65 to 75 years who are current or former smokers.  Skin self-exam. / Monthly.  Influenza vaccine. / Every year.  Tetanus, diphtheria, and acellular pertussis (Tdap/Td) vaccine.** / 1 dose of Td every 10 years.  Varicella vaccine.** / Consult your caregiver.  Zoster vaccine.** / 1 dose for adults aged 60 years or older.  Pneumococcal 13-valent conjugate (PCV13) vaccine.** / Consult your caregiver.  Pneumococcal polysaccharide (PPSV23) vaccine.** / 1 dose for all adults aged 65 years and older.  Meningococcal vaccine.** / Consult your caregiver.  Hepatitis A vaccine.** / Consult your caregiver.  Hepatitis B vaccine.** / Consult your caregiver.  Haemophilus influenzae type b (Hib) vaccine.** / Consult your caregiver. **Family history and personal history of risk and conditions may change your caregiver's recommendations. Document Released: 11/22/2001 Document Revised: 01/21/2013 Document Reviewed: 02/21/2011 [employer] Patient Information 2014 [employer]  

## 2013-08-27 NOTE — Progress Notes (Signed)
Subjective:    [name] is a 67 y.o. male who presents for Medicare Annual/Subsequent preventive examination.   Preventive Screening-Counseling & Management  Tobacco History  Smoking status  . Former Smoker -- 5 years  . Types: Cigarettes  . Quit date: 10/10/1965  Smokeless tobacco  . Not on file    Problems Prior to Visit 1.   Current Problems (verified) Patient Active Problem List   Diagnosis Date Noted  . Thrombocytopenia, unspecified 05/08/2013  . Hyperlipidemia 07/12/2011  . INFLUENZA WITH OTHER MANIFESTATIONS 10/28/2010  . ABDOMINAL BRUIT 07/22/2010  . HYPERTHYROIDISM 07/12/2010  . CORONARY ARTERY DISEASE 07/12/2010  . HEMORRHOIDS-INTERNAL 07/12/2010  . RECTAL BLEEDING 07/12/2010  . BLOOD IN STOOL, OCCULT 07/12/2010  . PERSONAL HISTORY OF COLONIC POLYPS 07/12/2010  . BLOOD IN STOOL 07/08/2010  . DIABETES-TYPE 2 07/06/2010  . HYPERTENSION, BENIGN 12/11/2008  . 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 CAD, NATIVE VESSEL 12/11/2008  . HYPOTHYROIDISM 07/10/2007  . Other and Unspecified Hyperlipidemia 07/10/2007  . DEPRESSION 07/10/2007  . HYPERTENSION 07/10/2007  . OSTEOARTHRITIS 07/10/2007  . HEADACHE 07/10/2007    Medications Prior to Visit Current Outpatient Prescriptions on File Prior to Visit  Medication Sig Dispense Refill  . aspirin 81 MG tablet Take 81 mg by mouth daily.        . Biotin 1000 MCG tablet Take 1,000 mcg by mouth daily.       . celecoxib (CELEBREX) 200 MG capsule Take 200 mg by mouth daily as needed.       . Cinnamon 500 MG capsule Take 500 mg by mouth 2 (two) times daily.       . Coenzyme Q10 (COQ-10) 100 MG capsule Take 100 mg by mouth 2 (two) times daily.       . Diclofenac Sodium (SOLARAZE) 3 % GEL Place 1 application onto the skin 2 (two) times daily.      Marland Kitchen ezetimibe-simvastatin (VYTORIN) 10-20 MG per tablet Take 1 tablet by mouth at bedtime.        . Ginger, Zingiber officinalis, (GINGER ROOT) 550 MG CAPS Take 1 capsule by mouth daily.         Marland Kitchen levothyroxine (SYNTHROID, LEVOTHROID) 112 MCG tablet Take 112 mcg by mouth daily.        Marland Kitchen losartan-hydrochlorothiazide (HYZAAR) 50-12.5 MG per tablet Take 1 tablet by mouth daily.  30 tablet  12  . metFORMIN (GLUMETZA) 500 MG (MOD) 24 hr tablet Take 1,000 mg by mouth daily.       . metoprolol succinate (TOPROL-XL) 50 MG 24 hr tablet Take 1 tablet (50 mg total) by mouth daily. Take with or immediately following a meal.  30 tablet  12  . multivitamin (THERAGRAN) per tablet Take 1 tablet by mouth daily.        . nitroGLYCERIN (NITROSTAT) 0.4 MG SL tablet Place 0.4 mg under the tongue every 5 (five) minutes as needed.        . NON FORMULARY Take 1 capsule by mouth daily. ( Tumeric 500 mg )       . Omega-3 Fatty Acids (FISH OIL) 1200 MG CAPS Take 2 capsules by mouth 2 (two) times daily.        . Policosanol 10 MG CAPS Take 10 mg by mouth daily.      . Tamsulosin HCl (FLOMAX) 0.4 MG CAPS Take 0.4 mg by mouth daily.        . traMADol (ULTRAM) 50 MG tablet Take 50 mg by mouth as needed. For  headache       No current facility-administered medications on file prior to visit.    Current Medications (verified) Current Outpatient Prescriptions  Medication Sig Dispense Refill  . aspirin 81 MG tablet Take 81 mg by mouth daily.        . Biotin 1000 MCG tablet Take 1,000 mcg by mouth daily.       . celecoxib (CELEBREX) 200 MG capsule Take 200 mg by mouth daily as needed.       . Cinnamon 500 MG capsule Take 500 mg by mouth 2 (two) times daily.       . Coenzyme Q10 (COQ-10) 100 MG capsule Take 100 mg by mouth 2 (two) times daily.       . Diclofenac Sodium (SOLARAZE) 3 % GEL Place 1 application onto the skin 2 (two) times daily.      Marland Kitchen ezetimibe-simvastatin (VYTORIN) 10-20 MG per tablet Take 1 tablet by mouth at bedtime.        . Ginger, Zingiber officinalis, (GINGER ROOT) 550 MG CAPS Take 1 capsule by mouth daily.        Marland Kitchen levothyroxine (SYNTHROID, LEVOTHROID) 112 MCG tablet Take 112 mcg by mouth  daily.        Marland Kitchen losartan-hydrochlorothiazide (HYZAAR) 50-12.5 MG per tablet Take 1 tablet by mouth daily.  30 tablet  12  . metFORMIN (GLUMETZA) 500 MG (MOD) 24 hr tablet Take 1,000 mg by mouth daily.       . metoprolol succinate (TOPROL-XL) 50 MG 24 hr tablet Take 1 tablet (50 mg total) by mouth daily. Take with or immediately following a meal.  30 tablet  12  . multivitamin (THERAGRAN) per tablet Take 1 tablet by mouth daily.        . nitroGLYCERIN (NITROSTAT) 0.4 MG SL tablet Place 0.4 mg under the tongue every 5 (five) minutes as needed.        . NON FORMULARY Take 1 capsule by mouth daily. ( Tumeric 500 mg )       . Omega-3 Fatty Acids (FISH OIL) 1200 MG CAPS Take 2 capsules by mouth 2 (two) times daily.        . Policosanol 10 MG CAPS Take 10 mg by mouth daily.      . Tamsulosin HCl (FLOMAX) 0.4 MG CAPS Take 0.4 mg by mouth daily.        . traMADol (ULTRAM) 50 MG tablet Take 50 mg by mouth as needed. For headache       No current facility-administered medications for this visit.     Allergies (verified) Codeine; Crestor; Hydrocodone-acetaminophen; and Rosuvastatin   PAST HISTORY  Family History Family History  Problem Relation Age of Onset  . Mental illness Mother     alzheimers  . Heart disease Mother   . Heart disease Father     triple by pass  . Hypertension Brother     Prostate Cancer  . Hypertension Brother   . Hypertension Brother     Social History History  Substance Use Topics  . Smoking status: Former Smoker -- 5 years    Types: Cigarettes    Quit date: 10/10/1965  . Smokeless tobacco: Not on file  . Alcohol Use: No    Are there smokers in your home (other than you)?  No  Risk Factors Current exercise habits: walking dog  Dietary issues discussed: na   Cardiac risk factors: advanced age (older than 82 for men, 25 for women), diabetes mellitus, dyslipidemia, hypertension,  male gender and sedentary lifestyle.  Depression Screen (Note: if answer to  either of the following is "Yes", a more complete depression screening is indicated)   Q1: Over the past two weeks, have you felt down, depressed or hopeless? No  Q2: Over the past two weeks, have you felt little interest or pleasure in doing things? No  Have you lost interest or pleasure in daily life? No  Do you often feel hopeless? No  Do you cry easily over simple problems? No  Activities of Daily Living In your present state of health, do you have any difficulty performing the following activities?:  Driving? No Managing money?  No Feeding yourself? No Getting from bed to chair? No Climbing a flight of stairs? No Preparing food and eating?: No Bathing or showering? No Getting dressed: No Getting to the toilet? No Using the toilet:No Moving around from place to place: No In the past year have you fallen or had a near fall?:No   Are you sexually active?  Yes  Do you have more than one partner?  No  Hearing Difficulties: No Do you often ask people to speak up or repeat themselves? No Do you experience ringing or noises in your ears? No Do you have difficulty understanding soft or whispered voices? No   Do you feel that you have a problem with memory? No  Do you often misplace items? No  Do you feel safe at home?  No  Cognitive Testing  Alert? Yes  Normal Appearance?Yes  Oriented to person? Yes  Place? Yes   Time? Yes  Recall of three objects?  Yes  Can perform simple calculations? Yes  Displays appropriate judgment?Yes  Can read the correct time from a watch face?Yes   Advanced Directives have been discussed with the patient? Yes   List the Names of Other Physician/Practitioners you currently use: 1.  Card-- crenshaw 2.  Endo-- kohut 3.  opth-- [facility] 4.  Dentist-- redding 5.  Ortho-- caffrey 6.  Podiatry-- Ajlouny 7. Urology-- borden 8.  Derm--lupton 9/  Surgeon- haywood 10.  GI-- Sabino Donovan any recent Medical Services you may have received from other  than [facility] providers in the past year (date may be approximate).  Immunization History  Administered Date(s) Administered  . Influenza Split 07/26/2011  . Influenza Whole 08/30/2007, 07/08/2009, 07/06/2010  . Influenza,inj,Quad PF,36+ Mos 07/05/2013  . Pneumococcal Polysaccharide 04/23/2003, 07/08/2009  . Td 02/19/2003  . Zoster 02/21/2007    Screening Tests Health Maintenance  Topic Date Due  . Hemoglobin A1c  11/29/2012  . Tetanus/tdap  02/18/2013  . Foot Exam  08/21/2013  . Urine Microalbumin  08/21/2013  . Ophthalmology Exam  09/20/2013  . Influenza Vaccine  05/10/2014  . Colonoscopy  07/29/2015  . Pneumococcal Polysaccharide Vaccine Age 64 And Over  Completed  . Zostavax  Completed    All answers were reviewed with the patient and necessary referrals were made:  Loreen Freud, DO   08/27/2013   History reviewed:  He  has a past medical history of CAD (coronary artery disease); Diabetes mellitus; Hypertension; Hyperlipidemia; Hypothyroid; Arthritis; Depression; Internal hemorrhoids; Thrombocytopenia; TIA (transient ischemic attack); Migraine headache; Skin cancer, basal cell; and Thrombocytopenia, unspecified (05/08/2013). He  does not have any pertinent problems on file. He  has past surgical history that includes Hernia repair; tosillectomy; Thyroidectomy, partial (07/2002); coronary artery disease status post placement; and right toe bone spur surgery. His family history includes Heart disease in his father and mother; Hypertension  in his brother, brother, and brother; Mental illness in his mother. He  reports that he quit smoking about 47 years ago. His smoking use included Cigarettes. He smoked 0.00 packs per day for 5 years. He does not have any smokeless tobacco history on file. He reports that he does not drink alcohol or use illicit drugs. He has a current medication list which includes the following prescription(s): aspirin, biotin, celecoxib, cinnamon, coq-10,  diclofenac sodium, ezetimibe-simvastatin, ginger root, levothyroxine, losartan-hydrochlorothiazide, metformin, metoprolol succinate, multivitamin, nitroglycerin, NON FORMULARY, fish oil, policosanol, tamsulosin, and tramadol. Current Outpatient Prescriptions on File Prior to Visit  Medication Sig Dispense Refill  . aspirin 81 MG tablet Take 81 mg by mouth daily.        . Biotin 1000 MCG tablet Take 1,000 mcg by mouth daily.       . celecoxib (CELEBREX) 200 MG capsule Take 200 mg by mouth daily as needed.       . Cinnamon 500 MG capsule Take 500 mg by mouth 2 (two) times daily.       . Coenzyme Q10 (COQ-10) 100 MG capsule Take 100 mg by mouth 2 (two) times daily.       . Diclofenac Sodium (SOLARAZE) 3 % GEL Place 1 application onto the skin 2 (two) times daily.      Marland Kitchen ezetimibe-simvastatin (VYTORIN) 10-20 MG per tablet Take 1 tablet by mouth at bedtime.        . Ginger, Zingiber officinalis, (GINGER ROOT) 550 MG CAPS Take 1 capsule by mouth daily.        Marland Kitchen levothyroxine (SYNTHROID, LEVOTHROID) 112 MCG tablet Take 112 mcg by mouth daily.        Marland Kitchen losartan-hydrochlorothiazide (HYZAAR) 50-12.5 MG per tablet Take 1 tablet by mouth daily.  30 tablet  12  . metFORMIN (GLUMETZA) 500 MG (MOD) 24 hr tablet Take 1,000 mg by mouth daily.       . metoprolol succinate (TOPROL-XL) 50 MG 24 hr tablet Take 1 tablet (50 mg total) by mouth daily. Take with or immediately following a meal.  30 tablet  12  . multivitamin (THERAGRAN) per tablet Take 1 tablet by mouth daily.        . nitroGLYCERIN (NITROSTAT) 0.4 MG SL tablet Place 0.4 mg under the tongue every 5 (five) minutes as needed.        . NON FORMULARY Take 1 capsule by mouth daily. ( Tumeric 500 mg )       . Omega-3 Fatty Acids (FISH OIL) 1200 MG CAPS Take 2 capsules by mouth 2 (two) times daily.        . Policosanol 10 MG CAPS Take 10 mg by mouth daily.      . Tamsulosin HCl (FLOMAX) 0.4 MG CAPS Take 0.4 mg by mouth daily.        . traMADol (ULTRAM) 50 MG  tablet Take 50 mg by mouth as needed. For headache       No current facility-administered medications on file prior to visit.   He is allergic to codeine; crestor; hydrocodone-acetaminophen; and rosuvastatin.  Review of Systems  Review of Systems  Constitutional: Negative for activity change, appetite change and fatigue.  HENT: Negative for hearing loss, congestion, tinnitus and ear discharge.   Eyes: Negative for visual disturbance (see optho q1y -- vision corrected to 20/20 with glasses).  Respiratory: Negative for cough, chest tightness and shortness of breath.   Cardiovascular: Negative for chest pain, palpitations and leg swelling.  Gastrointestinal: Negative  for abdominal pain, diarrhea, constipation and abdominal distention.  Genitourinary: Negative for urgency, frequency, decreased urine volume and difficulty urinating.  Musculoskeletal: Negative for back pain, arthralgias and gait problem.  Skin: Negative for color change, pallor and rash.  Neurological: Negative for dizziness, light-headedness, numbness and headaches.  Hematological: Negative for adenopathy. Does not bruise/bleed easily.  Psychiatric/Behavioral: Negative for suicidal ideas, confusion, sleep disturbance, self-injury, dysphoric mood, decreased concentration and agitation.  Pt is able to read and write and can do all ADLs No risk for falling No abuse/ violence in home      Objective:     Vision by Snellen chart:opth Blood pressure 122/80, pulse 73, temperature 97.9 F (36.6 C), temperature source Oral, height 5\' 9"  (1.753 m), weight 177 lb (80.287 kg), SpO2 95.00%. Body mass index is 26.13 kg/(m^2).  BP 122/80  Pulse 73  Temp(Src) 97.9 F (36.6 C) (Oral)  Ht 5\' 9"  (1.753 m)  Wt 177 lb (80.287 kg)  BMI 26.13 kg/m2  SpO2 95%  General Appearance:    Alert, cooperative, no distress, appears stated age  Head:    Normocephalic, without obvious abnormality, atraumatic  Eyes:    PERRL, conjunctiva/corneas  clear, EOM's intact, fundi    benign, both eyes       Ears:    Normal TM's and external ear canals, both ears  Nose:   Nares normal, septum midline, mucosa normal, no drainage   or sinus tenderness  Throat:   Lips, mucosa, and tongue normal; teeth and gums normal  Neck:   Supple, symmetrical, trachea midline, no adenopathy;       thyroid:  No enlargement/tenderness/nodules; no carotid   bruit or JVD  Back:     Symmetric, no curvature, ROM normal, no CVA tenderness  Lungs:     Clear to auscultation bilaterally, respirations unlabored  Chest wall:    No tenderness or deformity  Heart:    Regular rate and rhythm, S1 and S2 normal, no murmur, rub   or gallop  Abdomen:     Soft, non-tender, bowel sounds active all four quadrants,    no masses, no organomegaly  Genitalia:   urology  Rectal:  urology  Extremities:   Extremities normal, atraumatic, no cyanosis or edema  Pulses:   2+ and symmetric all extremities  Skin:   Skin color, texture, turgor normal, no rashes or lesions  Lymph nodes:   Cervical, supraclavicular, and axillary nodes normal  Neurologic:   CNII-XII intact. Normal strength, sensation and reflexes      throughout                        Sensory exam of the foot is normal, tested with the monofilament. Good pulses, no lesions or ulcers, good peripheral pulses.      Assessment:     cpe      Plan:     During the course of the visit the patient was educated and counseled about appropriate screening and preventive services including:    Pneumococcal vaccine   Influenza vaccine  Prostate cancer screening  Colorectal cancer screening  Diabetes screening  Glaucoma screening  Advanced directives: has NO advanced directive  - add't info requested. Referral to SW: to talk to SW  Diet review for nutrition referral? Yes ____  Not Indicated __x__   Patient Instructions (the written plan) was given to the patient.  Medicare Attestation I have personally  reviewed: The patient's medical and social history Their use  of alcohol, tobacco or illicit drugs Their current medications and supplements The patient's functional ability including ADLs,fall risks, home safety risks, cognitive, and hearing and visual impairment Diet and physical activities Evidence for depression or mood disorders  The patient's weight, height, BMI, and visual acuity have been recorded in the chart.  I have made referrals, counseling, and provided education to the patient based on review of the above and I have provided the patient with a written personalized care plan for preventive services.     Loreen Freud, DO   08/27/2013

## 2013-08-27 NOTE — Assessment & Plan Note (Signed)
Per card 

## 2013-08-27 NOTE — Progress Notes (Signed)
Pre visit review using our clinic review tool, if applicable. No additional management support is needed unless otherwise documented below in the visit note. 

## 2013-08-27 NOTE — Assessment & Plan Note (Signed)
Labs done per endo con't meds

## 2013-08-27 NOTE — Assessment & Plan Note (Signed)
Per endo °

## 2013-08-28 MED ORDER — TETANUS-DIPHTH-ACELL PERTUSSIS 5-2.5-18.5 LF-MCG/0.5 IM SUSP: 0.5000 mL | Freq: Once | INTRAMUSCULAR | Status: DC

## 2013-09-18 MED ORDER — TETANUS-DIPHTH-ACELL PERTUSSIS 5-2.5-18.5 LF-MCG/0.5 IM SUSP: 0.5000 mL | Freq: Once | INTRAMUSCULAR | Status: DC

## 2013-09-18 NOTE — Addendum Note (Signed)
Addended by: Arnette Norris on: 09/18/2013 01:53 PM   Modules accepted: Orders

## 2013-09-20 DIAGNOSIS — H52209 Unspecified astigmatism, unspecified eye: Secondary | ICD-10-CM | POA: Diagnosis not present

## 2013-09-20 DIAGNOSIS — H2510 Age-related nuclear cataract, unspecified eye: Secondary | ICD-10-CM | POA: Diagnosis not present

## 2013-09-30 DIAGNOSIS — E119 Type 2 diabetes mellitus without complications: Secondary | ICD-10-CM

## 2013-09-30 LAB — MICROALBUMIN / CREATININE URINE RATIO: Ratio: 12.5

## 2013-10-04 ENCOUNTER — Ambulatory Visit (INDEPENDENT_AMBULATORY_CARE_PROVIDER_SITE_OTHER): Payer: Medicare Other | Admitting: Cardiology

## 2013-10-04 ENCOUNTER — Encounter: Payer: Self-pay | Admitting: Cardiology

## 2013-10-04 VITALS — BP 150/80 | HR 68 | Ht 69.0 in | Wt 176.0 lb

## 2013-10-04 DIAGNOSIS — I2510 Atherosclerotic heart disease of native coronary artery without angina pectoris: Secondary | ICD-10-CM | POA: Diagnosis not present

## 2013-10-04 DIAGNOSIS — E785 Hyperlipidemia, unspecified: Secondary | ICD-10-CM

## 2013-10-04 MED ORDER — METOPROLOL SUCCINATE ER 50 MG PO TB24: 50.0000 mg | ORAL_TABLET | Freq: Every day | ORAL | Status: DC

## 2013-10-04 MED ORDER — LOSARTAN POTASSIUM-HCTZ 100-12.5 MG PO TABS: 1.0000 | ORAL_TABLET | Freq: Every day | ORAL | Status: DC

## 2013-10-04 NOTE — Progress Notes (Signed)
HPI: Mr. Mengel is a pleasant gentleman who has a history of PCI of his LAD in May 2003. His last cardiac catheterization was performed on December 16, 2008 by Dr. Juanda Chance for exertional chest pain. He was found to have a 30-40% LAD just distal to the stent. There was no other obstructive disease noted. The ejection fraction is 50%. He has been treated medically. Abdominal ultrasound in Oct 2011 showed no aneurysm. Nuclear study in January of 2014 showed an ejection fraction of 61%. There was no ischemia. I last saw him in Dec 2013. Since then the patient denies any dyspnea on exertion, orthopnea, PND, pedal edema, palpitations, syncope. Mild chest tightness with vigorous activities unchanged.   Current Outpatient Prescriptions  Medication Sig Dispense Refill  . aspirin 81 MG tablet Take 81 mg by mouth daily.        . Biotin 1000 MCG tablet Take 1,000 mcg by mouth daily.       . celecoxib (CELEBREX) 200 MG capsule Take 200 mg by mouth daily as needed.       . Cinnamon 500 MG capsule Take 500 mg by mouth 2 (two) times daily.       . Coenzyme Q10 (COQ-10) 100 MG capsule Take 100 mg by mouth 2 (two) times daily.       . Diclofenac Sodium (SOLARAZE) 3 % GEL Place 1 application onto the skin 2 (two) times daily.      Marland Kitchen ezetimibe-simvastatin (VYTORIN) 10-20 MG per tablet Take 1 tablet by mouth at bedtime.        . Ginger, Zingiber officinalis, (GINGER ROOT) 550 MG CAPS Take 1 capsule by mouth daily.        Marland Kitchen levothyroxine (SYNTHROID, LEVOTHROID) 112 MCG tablet Take 112 mcg by mouth daily.        Marland Kitchen losartan-hydrochlorothiazide (HYZAAR) 50-12.5 MG per tablet Take 1 tablet by mouth daily.  30 tablet  12  . metFORMIN (GLUMETZA) 500 MG (MOD) 24 hr tablet Take 1,000 mg by mouth daily.       . metoprolol succinate (TOPROL-XL) 50 MG 24 hr tablet Take 1 tablet (50 mg total) by mouth daily. Take with or immediately following a meal.  30 tablet  12  . multivitamin (THERAGRAN) per tablet Take 1 tablet by mouth  daily.        . nitroGLYCERIN (NITROSTAT) 0.4 MG SL tablet Place 0.4 mg under the tongue every 5 (five) minutes as needed.        . NON FORMULARY Take 1 capsule by mouth daily. ( Tumeric 500 mg )       . Omega-3 Fatty Acids (FISH OIL) 1200 MG CAPS Take 2 capsules by mouth 2 (two) times daily.        . Policosanol 10 MG CAPS Take 10 mg by mouth daily.      . Tamsulosin HCl (FLOMAX) 0.4 MG CAPS Take 0.4 mg by mouth daily.        . Tdap (BOOSTRIX) 5-2.5-18.5 LF-MCG/0.5 injection Inject 0.5 mLs into the muscle once.  0.5 mL  0  . traMADol (ULTRAM) 50 MG tablet Take 50 mg by mouth as needed. For headache       No current facility-administered medications for this visit.     Past Medical History  Diagnosis Date  . CAD (coronary artery disease)     s/p PTCI in 2003 and 2004 to the LAD.   . Diabetes mellitus   . Hypertension   .  Hyperlipidemia   . Hypothyroid   . Arthritis   . Depression   . Internal hemorrhoids   . Thrombocytopenia   . TIA (transient ischemic attack)     per patient's report.  He was never officially given this diagnosis  . Migraine headache   . Skin cancer, basal cell   . Thrombocytopenia, unspecified 05/08/2013    Past Surgical History  Procedure Laterality Date  . Hernia repair    . Tosillectomy    . Thyroidectomy, partial  07/2002    right thyroid  . Coronary artery disease status post placement      of drug-eluting stent in the LAD in 2003,eEF 65% then  . Right toe bone spur surgery      History   Social History  . Marital Status: Married    Spouse Name: N/A    Number of Children: 0  . Years of Education: N/A   Occupational History  . retired     Airline pilot   Social History Main Topics  . Smoking status: Former Smoker -- 5 years    Types: Cigarettes    Quit date: 10/10/1965  . Smokeless tobacco: Not on file  . Alcohol Use: No  . Drug Use: No  . Sexual Activity: Yes   Other Topics Concern  . Not on file   Social History Narrative  . No  narrative on file    ROS: arthralgias but no fevers or chills, productive cough, hemoptysis, dysphasia, odynophagia, melena, hematochezia, dysuria, hematuria, rash, seizure activity, orthopnea, PND, pedal edema, claudication. Remaining systems are negative.  Physical Exam: Well-developed well-nourished in no acute distress.  Skin is warm and dry.  HEENT is normal.  Neck is supple.  Chest is clear to auscultation with normal expansion.  Cardiovascular exam is regular rate and rhythm.  Abdominal exam nontender or distended. No masses palpated. Extremities show no edema. neuro grossly intact  ECG sinus rhythm at a rate of 68. No ST changes.

## 2013-10-04 NOTE — Assessment & Plan Note (Signed)
Continue aspirin and statin. 

## 2013-10-04 NOTE — Assessment & Plan Note (Signed)
Blood pressure elevated. Increase Cozaar 100 mg daily. Check potassium and renal function in one week. Monitor blood pressure at home and increase medications as needed.

## 2013-10-04 NOTE — Patient Instructions (Addendum)
Your physician wants you to follow-up in: ONE YEAR WITH DR Shelda Pal will receive a reminder letter in the mail two months in advance. If you don't receive a letter, please call our office to schedule the follow-up appointment.   INCREASE  LOSARTAN HCTZ TO 100/12.5 MG ONCE DAILY  Your physician recommends that you HAVE LAB WORK IN ONE WEEK

## 2013-10-04 NOTE — Assessment & Plan Note (Signed)
Continue statin. Lipids and liver monitored by primary care. 

## 2013-10-07 ENCOUNTER — Encounter: Payer: Self-pay | Admitting: Family Medicine

## 2013-10-11 ENCOUNTER — Telehealth: Payer: Self-pay | Admitting: Cardiology

## 2013-10-11 ENCOUNTER — Other Ambulatory Visit (INDEPENDENT_AMBULATORY_CARE_PROVIDER_SITE_OTHER): Payer: Medicare Other

## 2013-10-11 DIAGNOSIS — I2510 Atherosclerotic heart disease of native coronary artery without angina pectoris: Secondary | ICD-10-CM

## 2013-10-11 DIAGNOSIS — E785 Hyperlipidemia, unspecified: Secondary | ICD-10-CM | POA: Diagnosis not present

## 2013-10-11 LAB — BASIC METABOLIC PANEL
BUN: 19 mg/dL (ref 6–23)
CHLORIDE: 104 meq/L (ref 96–112)
CO2: 29 mEq/L (ref 19–32)
CREATININE: 1.2 mg/dL (ref 0.4–1.5)
Calcium: 8.9 mg/dL (ref 8.4–10.5)
GFR: 61.88 mL/min (ref >60.00)
Glucose, Bld: 131 mg/dL — ABNORMAL HIGH (ref 70–99)
Potassium: 3.7 mEq/L (ref 3.5–5.1)
Sodium: 139 mEq/L (ref 135–145)

## 2013-10-11 NOTE — Telephone Encounter (Signed)
Walk In pt Form " BP readings" Dropped off will hold For [name]

## 2013-10-15 DIAGNOSIS — R972 Elevated prostate specific antigen [PSA]: Secondary | ICD-10-CM | POA: Diagnosis not present

## 2013-10-22 DIAGNOSIS — R972 Elevated prostate specific antigen [PSA]: Secondary | ICD-10-CM | POA: Diagnosis not present

## 2013-10-22 DIAGNOSIS — N401 Enlarged prostate with lower urinary tract symptoms: Secondary | ICD-10-CM | POA: Diagnosis not present

## 2013-11-27 DIAGNOSIS — E0789 Other specified disorders of thyroid: Secondary | ICD-10-CM | POA: Diagnosis not present

## 2013-11-27 DIAGNOSIS — E789 Disorder of lipoprotein metabolism, unspecified: Secondary | ICD-10-CM | POA: Diagnosis not present

## 2013-11-27 DIAGNOSIS — E119 Type 2 diabetes mellitus without complications: Secondary | ICD-10-CM | POA: Diagnosis not present

## 2013-11-27 LAB — LIPID PANEL
CHOLESTEROL: 112 mg/dL (ref 0–200)
HDL: 61 mg/dL (ref 35–70)
LDL Cholesterol: 44 mg/dL
LDL/HDL RATIO: 0.7
TRIGLYCERIDES: 35 mg/dL — AB (ref 40–160)

## 2013-12-04 DIAGNOSIS — I10 Essential (primary) hypertension: Secondary | ICD-10-CM | POA: Diagnosis not present

## 2013-12-04 DIAGNOSIS — E0789 Other specified disorders of thyroid: Secondary | ICD-10-CM | POA: Diagnosis not present

## 2013-12-04 DIAGNOSIS — M79609 Pain in unspecified limb: Secondary | ICD-10-CM | POA: Diagnosis not present

## 2013-12-17 DIAGNOSIS — Z23 Encounter for immunization: Secondary | ICD-10-CM | POA: Diagnosis not present

## 2014-01-16 ENCOUNTER — Other Ambulatory Visit: Payer: Self-pay

## 2014-03-19 DIAGNOSIS — Z85828 Personal history of other malignant neoplasm of skin: Secondary | ICD-10-CM | POA: Diagnosis not present

## 2014-03-19 DIAGNOSIS — D235 Other benign neoplasm of skin of trunk: Secondary | ICD-10-CM | POA: Diagnosis not present

## 2014-03-19 DIAGNOSIS — L819 Disorder of pigmentation, unspecified: Secondary | ICD-10-CM | POA: Diagnosis not present

## 2014-03-19 DIAGNOSIS — L570 Actinic keratosis: Secondary | ICD-10-CM | POA: Diagnosis not present

## 2014-03-27 DIAGNOSIS — E119 Type 2 diabetes mellitus without complications: Secondary | ICD-10-CM | POA: Diagnosis not present

## 2014-03-27 LAB — HEPATIC FUNCTION PANEL
ALT: 29 U/L (ref 10–40)
AST: 30 U/L (ref 14–40)
Alkaline Phosphatase: 74 U/L (ref 25–125)
BILIRUBIN, TOTAL: 0.9 mg/dL

## 2014-03-27 LAB — BASIC METABOLIC PANEL
BUN: 22 mg/dL — AB (ref 4–21)
Creatinine: 1.3 mg/dL (ref 0.6–1.3)
GLUCOSE: 105 mg/dL
Potassium: 4.4 mmol/L (ref 3.4–5.3)
SODIUM: 140 mmol/L (ref 137–147)

## 2014-03-27 LAB — HEMOGLOBIN A1C: Hgb A1c MFr Bld: 5.1 % (ref 4.0–6.0)

## 2014-04-03 DIAGNOSIS — E789 Disorder of lipoprotein metabolism, unspecified: Secondary | ICD-10-CM | POA: Diagnosis not present

## 2014-04-03 DIAGNOSIS — E119 Type 2 diabetes mellitus without complications: Secondary | ICD-10-CM | POA: Diagnosis not present

## 2014-04-03 DIAGNOSIS — I10 Essential (primary) hypertension: Secondary | ICD-10-CM | POA: Diagnosis not present

## 2014-04-25 ENCOUNTER — Telehealth: Payer: Self-pay

## 2014-04-25 DIAGNOSIS — E119 Type 2 diabetes mellitus without complications: Secondary | ICD-10-CM

## 2014-04-25 DIAGNOSIS — E785 Hyperlipidemia, unspecified: Secondary | ICD-10-CM

## 2014-04-25 NOTE — Telephone Encounter (Signed)
Diabetic bundle  mychart message sent  Needs bp recheck with nurse  A1C and lipid drawn  Lab order placed

## 2014-07-11 DIAGNOSIS — Z23 Encounter for immunization: Secondary | ICD-10-CM | POA: Diagnosis not present

## 2014-07-25 ENCOUNTER — Other Ambulatory Visit: Payer: Self-pay

## 2014-09-02 ENCOUNTER — Other Ambulatory Visit: Payer: Self-pay

## 2014-09-02 DIAGNOSIS — E785 Hyperlipidemia, unspecified: Secondary | ICD-10-CM

## 2014-09-02 MED ORDER — LOSARTAN POTASSIUM-HCTZ 100-12.5 MG PO TABS: 1.0000 | ORAL_TABLET | Freq: Every day | ORAL | Status: DC

## 2014-09-09 ENCOUNTER — Encounter: Payer: Self-pay | Admitting: Family Medicine

## 2014-09-09 ENCOUNTER — Ambulatory Visit (INDEPENDENT_AMBULATORY_CARE_PROVIDER_SITE_OTHER): Payer: Medicare Other | Admitting: Family Medicine

## 2014-09-09 VITALS — BP 120/80 | HR 60 | Temp 97.5°F | Ht 69.0 in | Wt 181.4 lb

## 2014-09-09 DIAGNOSIS — E785 Hyperlipidemia, unspecified: Secondary | ICD-10-CM | POA: Diagnosis not present

## 2014-09-09 DIAGNOSIS — R1013 Epigastric pain: Secondary | ICD-10-CM | POA: Diagnosis not present

## 2014-09-09 DIAGNOSIS — I2510 Atherosclerotic heart disease of native coronary artery without angina pectoris: Secondary | ICD-10-CM | POA: Diagnosis not present

## 2014-09-09 DIAGNOSIS — I10 Essential (primary) hypertension: Secondary | ICD-10-CM | POA: Diagnosis not present

## 2014-09-09 DIAGNOSIS — Z0000 Encounter for general adult medical examination without abnormal findings: Secondary | ICD-10-CM

## 2014-09-09 DIAGNOSIS — N400 Enlarged prostate without lower urinary tract symptoms: Secondary | ICD-10-CM | POA: Diagnosis not present

## 2014-09-09 DIAGNOSIS — H9190 Unspecified hearing loss, unspecified ear: Secondary | ICD-10-CM

## 2014-09-09 DIAGNOSIS — H9313 Tinnitus, bilateral: Secondary | ICD-10-CM | POA: Diagnosis not present

## 2014-09-09 DIAGNOSIS — E119 Type 2 diabetes mellitus without complications: Secondary | ICD-10-CM

## 2014-09-09 LAB — POCT URINALYSIS DIPSTICK
BILIRUBIN UA: NEGATIVE
Blood, UA: NEGATIVE
GLUCOSE UA: NEGATIVE
KETONES UA: NEGATIVE
Leukocytes, UA: NEGATIVE
Nitrite, UA: NEGATIVE
Spec Grav, UA: 1.025
Urobilinogen, UA: 0.2
pH, UA: 6

## 2014-09-09 LAB — CBC WITH DIFFERENTIAL/PLATELET
Basophils Absolute: 0 10*3/uL (ref 0.0–0.1)
Basophils Relative: 0.4 % (ref 0.0–3.0)
EOS PCT: 2.9 % (ref 0.0–5.0)
Eosinophils Absolute: 0.2 10*3/uL (ref 0.0–0.7)
HEMATOCRIT: 52 % (ref 39.0–52.0)
HEMOGLOBIN: 17.5 g/dL — AB (ref 13.0–17.0)
LYMPHS ABS: 1.3 10*3/uL (ref 0.7–4.0)
Lymphocytes Relative: 22.9 % (ref 12.0–46.0)
MCHC: 33.6 g/dL (ref 30.0–36.0)
MCV: 93.4 fl (ref 78.0–100.0)
MONOS PCT: 7.7 % (ref 3.0–12.0)
Monocytes Absolute: 0.4 10*3/uL (ref 0.1–1.0)
NEUTROS ABS: 3.6 10*3/uL (ref 1.4–7.7)
Neutrophils Relative %: 66.1 % (ref 43.0–77.0)
Platelets: 169 10*3/uL (ref 150.0–400.0)
RBC: 5.57 Mil/uL (ref 4.22–5.81)
RDW: 12.9 % (ref 11.5–15.5)
WBC: 5.5 10*3/uL (ref 4.0–10.5)

## 2014-09-09 LAB — PSA: PSA: 2.64 ng/mL (ref 0.10–4.00)

## 2014-09-09 LAB — HEMOGLOBIN A1C: Hgb A1c MFr Bld: 5.3 % (ref 4.6–6.5)

## 2014-09-09 LAB — MICROALBUMIN / CREATININE URINE RATIO
Creatinine,U: 216.6 mg/dL
MICROALB UR: 2.3 mg/dL — AB (ref 0.0–1.9)
Microalb Creat Ratio: 1.1 mg/g (ref 0.0–30.0)

## 2014-09-09 MED ORDER — OMEPRAZOLE 40 MG PO CPDR: 40.0000 mg | DELAYED_RELEASE_CAPSULE | Freq: Every day | ORAL | Status: DC

## 2014-09-09 NOTE — Progress Notes (Signed)
Subjective:    [name] is a 68 y.o. male who presents for Medicare Annual/Subsequent preventive examination.   Preventive Screening-Counseling & Management  Tobacco History  Smoking status  . Former Smoker -- 5 years  . Types: Cigarettes  . Quit date: 10/10/1965  Smokeless tobacco  . Not on file    Problems Prior to Visit 1. Migraines-- has a hx---becoming more frequent --ringing in ears and hearing loss 2. Pt also c/o heartburn every night ---tums relieves it  Current Problems (verified) Patient Active Problem List   Diagnosis Date Noted  . Thrombocytopenia, unspecified 05/08/2013  . Hyperlipidemia 07/12/2011  . INFLUENZA WITH OTHER MANIFESTATIONS 10/28/2010  . ABDOMINAL BRUIT 07/22/2010  . HYPERTHYROIDISM 07/12/2010  . CORONARY ARTERY DISEASE 07/12/2010  . HEMORRHOIDS-INTERNAL 07/12/2010  . RECTAL BLEEDING 07/12/2010  . BLOOD IN STOOL, OCCULT 07/12/2010  . PERSONAL HISTORY OF COLONIC POLYPS 07/12/2010  . BLOOD IN STOOL 07/08/2010  . DIABETES-TYPE 2 07/06/2010  . HYPERTENSION, BENIGN 12/11/2008  . 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 CAD, NATIVE VESSEL 12/11/2008  . HYPOTHYROIDISM 07/10/2007  . Other and Unspecified Hyperlipidemia 07/10/2007  . DEPRESSION 07/10/2007  . OSTEOARTHRITIS 07/10/2007  . HEADACHE 07/10/2007    Medications Prior to Visit Current Outpatient Prescriptions on File Prior to Visit  Medication Sig Dispense Refill  . aspirin 81 MG tablet Take 81 mg by mouth daily.      . Biotin 1000 MCG tablet Take 1,000 mcg by mouth daily.     . celecoxib (CELEBREX) 200 MG capsule Take 200 mg by mouth daily as needed.     . Cinnamon 500 MG capsule Take 500 mg by mouth 2 (two) times daily.     . Coenzyme Q10 (COQ-10) 100 MG capsule Take 100 mg by mouth 2 (two) times daily.     . Diclofenac Sodium (SOLARAZE) 3 % GEL Place 1 application onto the skin 2 (two) times daily.    Marland Kitchen ezetimibe-simvastatin (VYTORIN) 10-20 MG per tablet Take 1 tablet by mouth at bedtime.       . Ginger, Zingiber officinalis, (GINGER ROOT) 550 MG CAPS Take 1 capsule by mouth daily.      Marland Kitchen levothyroxine (SYNTHROID, LEVOTHROID) 112 MCG tablet Take 112 mcg by mouth daily.      Marland Kitchen losartan-hydrochlorothiazide (HYZAAR) 100-12.5 MG per tablet Take 1 tablet by mouth daily. 90 tablet 0  . metFORMIN (GLUMETZA) 500 MG (MOD) 24 hr tablet Take 1,000 mg by mouth daily.     . metoprolol succinate (TOPROL-XL) 50 MG 24 hr tablet Take 1 tablet (50 mg total) by mouth daily. Take with or immediately following a meal. 30 tablet 12  . multivitamin (THERAGRAN) per tablet Take 1 tablet by mouth daily.      . nitroGLYCERIN (NITROSTAT) 0.4 MG SL tablet Place 0.4 mg under the tongue every 5 (five) minutes as needed.      . NON FORMULARY Take 1 capsule by mouth daily. ( Tumeric 500 mg )     . Omega-3 Fatty Acids (FISH OIL) 1200 MG CAPS Take 2 capsules by mouth 2 (two) times daily.      Marland Kitchen OVER THE COUNTER MEDICATION TAKE TUMERIC 5OOMG DAILY    . Policosanol 10 MG CAPS Take 10 mg by mouth daily.    . Tamsulosin HCl (FLOMAX) 0.4 MG CAPS Take 0.4 mg by mouth daily.      . traMADol (ULTRAM) 50 MG tablet Take 50 mg by mouth as needed. For headache     No current  facility-administered medications on file prior to visit.    Current Medications (verified) Current Outpatient Prescriptions  Medication Sig Dispense Refill  . aspirin 81 MG tablet Take 81 mg by mouth daily.      . Biotin 1000 MCG tablet Take 1,000 mcg by mouth daily.     . celecoxib (CELEBREX) 200 MG capsule Take 200 mg by mouth daily as needed.     . Cinnamon 500 MG capsule Take 500 mg by mouth 2 (two) times daily.     . Coenzyme Q10 (COQ-10) 100 MG capsule Take 100 mg by mouth 2 (two) times daily.     . Diclofenac Sodium (SOLARAZE) 3 % GEL Place 1 application onto the skin 2 (two) times daily.    Marland Kitchen ezetimibe-simvastatin (VYTORIN) 10-20 MG per tablet Take 1 tablet by mouth at bedtime.      . Ginger, Zingiber officinalis, (GINGER ROOT) 550 MG CAPS  Take 1 capsule by mouth daily.      Marland Kitchen levothyroxine (SYNTHROID, LEVOTHROID) 112 MCG tablet Take 112 mcg by mouth daily.      Marland Kitchen losartan-hydrochlorothiazide (HYZAAR) 100-12.5 MG per tablet Take 1 tablet by mouth daily. 90 tablet 0  . metFORMIN (GLUMETZA) 500 MG (MOD) 24 hr tablet Take 1,000 mg by mouth daily.     . metoprolol succinate (TOPROL-XL) 50 MG 24 hr tablet Take 1 tablet (50 mg total) by mouth daily. Take with or immediately following a meal. 30 tablet 12  . multivitamin (THERAGRAN) per tablet Take 1 tablet by mouth daily.      . nitroGLYCERIN (NITROSTAT) 0.4 MG SL tablet Place 0.4 mg under the tongue every 5 (five) minutes as needed.      . NON FORMULARY Take 1 capsule by mouth daily. ( Tumeric 500 mg )     . Omega-3 Fatty Acids (FISH OIL) 1200 MG CAPS Take 2 capsules by mouth 2 (two) times daily.      Marland Kitchen OVER THE COUNTER MEDICATION TAKE TUMERIC 5OOMG DAILY    . Policosanol 10 MG CAPS Take 10 mg by mouth daily.    . Tamsulosin HCl (FLOMAX) 0.4 MG CAPS Take 0.4 mg by mouth daily.      . traMADol (ULTRAM) 50 MG tablet Take 50 mg by mouth as needed. For headache     No current facility-administered medications for this visit.     Allergies (verified) Codeine; Crestor; Hydrocodone-acetaminophen; and Rosuvastatin   PAST HISTORY  Family History Family History  Problem Relation Age of Onset  . Mental illness Mother     alzheimers  . Heart disease Mother   . Heart disease Father     triple by pass  . Hypertension Brother     Prostate Cancer  . Hypertension Brother   . Hypertension Brother   . COPD Brother     Social History History  Substance Use Topics  . Smoking status: Former Smoker -- 5 years    Types: Cigarettes    Quit date: 10/10/1965  . Smokeless tobacco: Not on file  . Alcohol Use: No    Are there smokers in your home (other than you)?  No  Risk Factors Current exercise habits: treadmill and stationary bike  Dietary issues discussed: na   Cardiac risk  factors: advanced age (older than 75 for men, 74 for women), diabetes mellitus, dyslipidemia, hypertension and male gender.  Depression Screen (Note: if answer to either of the following is "Yes", a more complete depression screening is indicated)   Q1: Over  the past two weeks, have you felt down, depressed or hopeless? No  Q2: Over the past two weeks, have you felt little interest or pleasure in doing things? No  Have you lost interest or pleasure in daily life? No  Do you often feel hopeless? No  Do you cry easily over simple problems? No  Activities of Daily Living In your present state of health, do you have any difficulty performing the following activities?:  Driving? No Managing money?  No Feeding yourself? No Getting from bed to chair? No Climbing a flight of stairs? No Preparing food and eating?: No Bathing or showering? No Getting dressed: No Getting to the toilet? No Using the toilet:No Moving around from place to place: No In the past year have you fallen or had a near fall?:No   Are you sexually active?  Yes  Do you have more than one partner?  No  Hearing Difficulties: Yes Do you often ask people to speak up or repeat themselves? Yes Do you experience ringing or noises in your ears? Yes Do you have difficulty understanding soft or whispered voices? Yes   Do you feel that you have a problem with memory? No  Do you often misplace items? No  Do you feel safe at home?  No  Cognitive Testing  Alert? Yes  Normal Appearance?Yes  Oriented to person? Yes  Place? Yes   Time? Yes  Recall of three objects?  Yes  Can perform simple calculations? Yes  Displays appropriate judgment?Yes  Can read the correct time from a watch face?Yes   Advanced Directives have been discussed with the patient? Yes   List the Names of Other Physician/Practitioners you currently use: 1.  opth--[facility] 2. Dentist- [facility] 3  Card--[facility] 4, [place]-- endo 5  Derm-- [place] 6 uro--  [facility]ordan 7  Sport med-- caffrey Indicate any recent Medical Services you may have received from other than [facility] providers in the past year (date may be approximate).  Immunization History  Administered Date(s) Administered  . Influenza Split 07/26/2011  . Influenza Whole 08/30/2007, 07/08/2009, 07/06/2010  . Influenza, High Dose Seasonal PF 07/11/2014  . Influenza,inj,Quad PF,36+ Mos 07/05/2013  . Pneumococcal Conjugate-13 12/17/2013  . Pneumococcal Polysaccharide-23 04/23/2003, 07/08/2009  . Td 02/19/2003  . Tdap 09/19/2013  . Zoster 02/21/2007    Screening Tests Health Maintenance  Topic Date Due  . URINE MICROALBUMIN  05/29/2014  . FOOT EXAM  08/27/2014  . OPHTHALMOLOGY EXAM  09/20/2014  . HEMOGLOBIN A1C  09/26/2014  . INFLUENZA VACCINE  05/11/2015  . COLONOSCOPY  07/29/2015  . TETANUS/TDAP  09/20/2023  . PNEUMOCOCCAL POLYSACCHARIDE VACCINE AGE 98 AND OVER  Completed  . ZOSTAVAX  Completed    All answers were reviewed with the patient and necessary referrals were made:  Garnet Koyanagi, DO   09/09/2014   History reviewed:  He  has a past medical history of CAD (coronary artery disease); Diabetes mellitus; Hypertension; Hyperlipidemia; Hypothyroid; Arthritis; Depression; Internal hemorrhoids; Thrombocytopenia; TIA (transient ischemic attack); Migraine headache; Skin cancer, basal cell; and Thrombocytopenia, unspecified (05/08/2013). He  does not have any pertinent problems on file. He  has past surgical history that includes Hernia repair; tosillectomy; Thyroidectomy, partial (07/2002); coronary artery disease status post placement; and right toe bone spur surgery. His family history includes COPD in his brother; Heart disease in his father and mother; Hypertension in his brother, brother, and brother; Mental illness in his mother. He  reports that he quit smoking about 48 years ago. His smoking  use included Cigarettes. He smoked 0.00 packs per day for 5 years. He does not have any  smokeless tobacco history on file. He reports that he does not drink alcohol or use illicit drugs. He has a current medication list which includes the following prescription(s): aspirin, biotin, celecoxib, cinnamon, coq-10, diclofenac sodium, ezetimibe-simvastatin, ginger root, levothyroxine, losartan-hydrochlorothiazide, metformin, metoprolol succinate, multivitamin, nitroglycerin, NON FORMULARY, fish oil, OVER THE COUNTER MEDICATION, policosanol, tamsulosin, and tramadol. Current Outpatient Prescriptions on File Prior to Visit  Medication Sig Dispense Refill  . aspirin 81 MG tablet Take 81 mg by mouth daily.      . Biotin 1000 MCG tablet Take 1,000 mcg by mouth daily.     . celecoxib (CELEBREX) 200 MG capsule Take 200 mg by mouth daily as needed.     . Cinnamon 500 MG capsule Take 500 mg by mouth 2 (two) times daily.     . Coenzyme Q10 (COQ-10) 100 MG capsule Take 100 mg by mouth 2 (two) times daily.     . Diclofenac Sodium (SOLARAZE) 3 % GEL Place 1 application onto the skin 2 (two) times daily.    Marland Kitchen ezetimibe-simvastatin (VYTORIN) 10-20 MG per tablet Take 1 tablet by mouth at bedtime.      . Ginger, Zingiber officinalis, (GINGER ROOT) 550 MG CAPS Take 1 capsule by mouth daily.      Marland Kitchen levothyroxine (SYNTHROID, LEVOTHROID) 112 MCG tablet Take 112 mcg by mouth daily.      Marland Kitchen losartan-hydrochlorothiazide (HYZAAR) 100-12.5 MG per tablet Take 1 tablet by mouth daily. 90 tablet 0  . metFORMIN (GLUMETZA) 500 MG (MOD) 24 hr tablet Take 1,000 mg by mouth daily.     . metoprolol succinate (TOPROL-XL) 50 MG 24 hr tablet Take 1 tablet (50 mg total) by mouth daily. Take with or immediately following a meal. 30 tablet 12  . multivitamin (THERAGRAN) per tablet Take 1 tablet by mouth daily.      . nitroGLYCERIN (NITROSTAT) 0.4 MG SL tablet Place 0.4 mg under the tongue every 5 (five) minutes as needed.      . NON FORMULARY Take 1 capsule by mouth daily. ( Tumeric 500 mg )     . Omega-3 Fatty Acids (FISH OIL)  1200 MG CAPS Take 2 capsules by mouth 2 (two) times daily.      Marland Kitchen OVER THE COUNTER MEDICATION TAKE TUMERIC 5OOMG DAILY    . Policosanol 10 MG CAPS Take 10 mg by mouth daily.    . Tamsulosin HCl (FLOMAX) 0.4 MG CAPS Take 0.4 mg by mouth daily.      . traMADol (ULTRAM) 50 MG tablet Take 50 mg by mouth as needed. For headache     No current facility-administered medications on file prior to visit.   He is allergic to codeine; crestor; hydrocodone-acetaminophen; and rosuvastatin.  Review of Systems  Review of Systems  Constitutional: Negative for activity change, appetite change and fatigue.  HENT: Negative for hearing loss, congestion, tinnitus and ear discharge.   Eyes: Negative for visual disturbance (see optho q1y -- vision corrected to 20/20 with glasses).  Respiratory: Negative for cough, chest tightness and shortness of breath.   Cardiovascular: Negative for chest pain, palpitations and leg swelling.  Gastrointestinal: Negative for abdominal pain, diarrhea, constipation and abdominal distention.  Genitourinary: Negative for urgency, frequency, decreased urine volume and difficulty urinating.  Musculoskeletal: Negative for back pain, arthralgias and gait problem.  Skin: Negative for color change, pallor and rash.  Neurological: Negative for dizziness, light-headedness, numbness  and headaches.  Hematological: Negative for adenopathy. Does not bruise/bleed easily.  Psychiatric/Behavioral: Negative for suicidal ideas, confusion, sleep disturbance, self-injury, dysphoric mood, decreased concentration and agitation.  Pt is able to read and write and can do all ADLs No risk for falling No abuse/ violence in home      Objective:     Vision by Snellen chart: opth Blood pressure 120/80, pulse 60, temperature 97.5 F (36.4 C), temperature source Oral, height 5\' 9"  (1.753 m), weight 181 lb 6.4 oz (82.283 kg), SpO2 96 %. Body mass index is 26.78 kg/(m^2).  BP 120/80 mmHg  Pulse 60   Temp(Src) 97.5 F (36.4 C) (Oral)  Ht 5\' 9"  (1.753 m)  Wt 181 lb 6.4 oz (82.283 kg)  BMI 26.78 kg/m2  SpO2 96% General appearance: alert, cooperative, appears stated age and no distress Head: Normocephalic, without obvious abnormality, atraumatic Eyes: negative findings: lids and lashes normal and pupils equal, round, reactive to light and accomodation Ears: normal TM's and external ear canals both ears Nose: Nares normal. Septum midline. Mucosa normal. No drainage or sinus tenderness. Throat: lips, mucosa, and tongue normal; teeth and gums normal Neck: no adenopathy, no carotid bruit, no JVD, supple, symmetrical, trachea midline and thyroid not enlarged, symmetric, no tenderness/mass/nodules Back: symmetric, no curvature. ROM normal. No CVA tenderness. Lungs: clear to auscultation bilaterally Chest wall: no tenderness Heart: regular rate and rhythm, S1, S2 normal, no murmur, click, rub or gallop Abdomen: soft, non-tender; bowel sounds normal; no masses,  no organomegaly Male genitalia: deferred---urology Rectal: deferred--urology Extremities: extremities normal, atraumatic, no cyanosis or edema Pulses: 2+ and symmetric Skin: Skin color, texture, turgor normal. No rashes or lesions Lymph nodes: Cervical, supraclavicular, and axillary nodes normal. Neurologic: Alert and oriented X 3, normal strength and tone. Normal symmetric reflexes. Normal coordination and gait Psych-- no depression, no anxiety      Assessment:     cpe      Plan:     During the course of the visit the patient was educated and counseled about appropriate screening and preventive services including:    Pneumococcal vaccine   Influenza vaccine  Td vaccine  Prostate cancer screening  Colorectal cancer screening  Diabetes screening  Glaucoma screening  Advanced directives: has NO advanced directive  - add't info requested. Referral to SW: pt will call SW at [facility] Diet review for nutrition  referral? Yes ____  Not Indicated __x__   Patient Instructions (the written plan) was given to the patient.  Medicare Attestation I have personally reviewed: The patient's medical and social history Their use of alcohol, tobacco or illicit drugs Their current medications and supplements The patient's functional ability including ADLs,fall risks, home safety risks, cognitive, and hearing and visual impairment Diet and physical activities Evidence for depression or mood disorders  The patient's weight, height, BMI, and visual acuity have been recorded in the chart.  I have made referrals, counseling, and provided education to the patient based on review of the above and I have provided the patient with a written personalized care plan for preventive services.    1. Essential hypertension Stable, con't meds - Basic metabolic panel - CBC with Differential - Hepatic function panel - Lipid panel - POCT urinalysis dipstick - PSA  2. Medicare annual wellness visit, subsequent    3. Diabetes type 2, controlled Stable, con't meds - Basic metabolic panel - CBC with Differential - Hepatic function panel - Lipid panel - POCT urinalysis dipstick - PSA - Hemoglobin A1c -  Microalbumin / creatinine urine ratio  4. Hyperlipidemia Check labs, con't meds - Basic metabolic panel - CBC with Differential - Hepatic function panel - Lipid panel - POCT urinalysis dipstick - PSA  5. Dyspepsia omeprazole (PRILOSEC) 40 MG capsule; Take 1 capsule (40 mg total) by mouth daily.  Dispense: 30 capsule; Refill: 11 - Ambulatory referral to Gastroenterology  6. BPH (benign prostatic hypertrophy)  - PSA  7. Hearing loss, unspecified laterality  - Ambulatory referral to ENT  8. Tinnitus, bilateral  - Ambulatory referral to ENT   Garnet Koyanagi, DO   09/09/2014

## 2014-09-09 NOTE — Patient Instructions (Signed)
Preventive Care for Adults A healthy lifestyle and preventive care can promote health and wellness. Preventive health guidelines for men include the following key practices:  A routine yearly physical is a good way to check with your health care provider about your health and preventative screening. It is a chance to share any concerns and updates on your health and to receive a thorough exam.  Visit your dentist for a routine exam and preventative care every 6 months. Brush your teeth twice a day and floss once a day. Good oral hygiene prevents tooth decay and gum disease.  The frequency of eye exams is based on your age, health, family medical history, use of contact lenses, and other factors. Follow your health care provider's recommendations for frequency of eye exams.  Eat a healthy diet. Foods such as vegetables, fruits, whole grains, low-fat dairy products, and lean protein foods contain the nutrients you need without too many calories. Decrease your intake of foods high in solid fats, added sugars, and salt. Eat the right amount of calories for you.Get information about a proper diet from your health care provider, if necessary.  Regular physical exercise is one of the most important things you can do for your health. Most adults should get at least 150 minutes of moderate-intensity exercise (any activity that increases your heart rate and causes you to sweat) each week. In addition, most adults need muscle-strengthening exercises on 2 or more days a week.  Maintain a healthy weight. The body mass index (BMI) is a screening tool to identify possible weight problems. It provides an estimate of body fat based on height and weight. Your health care provider can find your BMI and can help you achieve or maintain a healthy weight.For adults 20 years and older:  A BMI below 18.5 is considered underweight.  A BMI of 18.5 to 24.9 is normal.  A BMI of 25 to 29.9 is considered overweight.  A BMI  of 30 and above is considered obese.  Maintain normal blood lipids and cholesterol levels by exercising and minimizing your intake of saturated fat. Eat a balanced diet with plenty of fruit and vegetables. Blood tests for lipids and cholesterol should begin at age 50 and be repeated every 5 years. If your lipid or cholesterol levels are high, you are over 50, or you are at high risk for heart disease, you may need your cholesterol levels checked more frequently.Ongoing high lipid and cholesterol levels should be treated with medicines if diet and exercise are not working.  If you smoke, find out from your health care provider how to quit. If you do not use tobacco, do not start.  Lung cancer screening is recommended for adults aged 73-80 years who are at high risk for developing lung cancer because of a history of smoking. A yearly low-dose CT scan of the lungs is recommended for people who have at least a 30-pack-year history of smoking and are a current smoker or have quit within the past 15 years. A pack year of smoking is smoking an average of 1 pack of cigarettes a day for 1 year (for example: 1 pack a day for 30 years or 2 packs a day for 15 years). Yearly screening should continue until the smoker has stopped smoking for at least 15 years. Yearly screening should be stopped for people who develop a health problem that would prevent them from having lung cancer treatment.  If you choose to drink alcohol, do not have more than  2 drinks per day. One drink is considered to be 12 ounces (355 mL) of beer, 5 ounces (148 mL) of wine, or 1.5 ounces (44 mL) of liquor.  Avoid use of street drugs. Do not share needles with anyone. Ask for help if you need support or instructions about stopping the use of drugs.  High blood pressure causes heart disease and increases the risk of stroke. Your blood pressure should be checked at least every 1-2 years. Ongoing high blood pressure should be treated with  medicines, if weight loss and exercise are not effective.  If you are 45-79 years old, ask your health care provider if you should take aspirin to prevent heart disease.  Diabetes screening involves taking a blood sample to check your fasting blood sugar level. This should be done once every 3 years, after age 45, if you are within normal weight and without risk factors for diabetes. Testing should be considered at a younger age or be carried out more frequently if you are overweight and have at least 1 risk factor for diabetes.  Colorectal cancer can be detected and often prevented. Most routine colorectal cancer screening begins at the age of 50 and continues through age 75. However, your health care provider may recommend screening at an earlier age if you have risk factors for colon cancer. On a yearly basis, your health care provider may provide home test kits to check for hidden blood in the stool. Use of a small camera at the end of a tube to directly examine the colon (sigmoidoscopy or colonoscopy) can detect the earliest forms of colorectal cancer. Talk to your health care provider about this at age 50, when routine screening begins. Direct exam of the colon should be repeated every 5-10 years through age 75, unless early forms of precancerous polyps or small growths are found.  People who are at an increased risk for hepatitis B should be screened for this virus. You are considered at high risk for hepatitis B if:  You were born in a country where hepatitis B occurs often. Talk with your health care provider about which countries are considered high risk.  Your parents were born in a high-risk country and you have not received a shot to protect against hepatitis B (hepatitis B vaccine).  You have HIV or AIDS.  You use needles to inject street drugs.  You live with, or have sex with, someone who has hepatitis B.  You are a man who has sex with other men (MSM).  You get hemodialysis  treatment.  You take certain medicines for conditions such as cancer, organ transplantation, and autoimmune conditions.  Hepatitis C blood testing is recommended for all people born from 1945 through 1965 and any individual with known risks for hepatitis C.  Practice safe sex. Use condoms and avoid high-risk sexual practices to reduce the spread of sexually transmitted infections (STIs). STIs include gonorrhea, chlamydia, syphilis, trichomonas, herpes, HPV, and human immunodeficiency virus (HIV). Herpes, HIV, and HPV are viral illnesses that have no cure. They can result in disability, cancer, and death.  If you are at risk of being infected with HIV, it is recommended that you take a prescription medicine daily to prevent HIV infection. This is called preexposure prophylaxis (PrEP). You are considered at risk if:  You are a man who has sex with other men (MSM) and have other risk factors.  You are a heterosexual man, are sexually active, and are at increased risk for HIV infection.    You take drugs by injection.  You are sexually active with a partner who has HIV.  Talk with your health care provider about whether you are at high risk of being infected with HIV. If you choose to begin PrEP, you should first be tested for HIV. You should then be tested every 3 months for as long as you are taking PrEP.  A one-time screening for abdominal aortic aneurysm (AAA) and surgical repair of large AAAs by ultrasound are recommended for men ages 32 to 67 years who are current or former smokers.  Healthy men should no longer receive prostate-specific antigen (PSA) blood tests as part of routine cancer screening. Talk with your health care provider about prostate cancer screening.  Testicular cancer screening is not recommended for adult males who have no symptoms. Screening includes self-exam, a health care provider exam, and other screening tests. Consult with your health care provider about any symptoms  you have or any concerns you have about testicular cancer.  Use sunscreen. Apply sunscreen liberally and repeatedly throughout the day. You should seek shade when your shadow is shorter than you. Protect yourself by wearing long sleeves, pants, a wide-brimmed hat, and sunglasses year round, whenever you are outdoors.  Once a month, do a whole-body skin exam, using a mirror to look at the skin on your back. Tell your health care provider about new moles, moles that have irregular borders, moles that are larger than a pencil eraser, or moles that have changed in shape or color.  Stay current with required vaccines (immunizations).  Influenza vaccine. All adults should be immunized every year.  Tetanus, diphtheria, and acellular pertussis (Td, Tdap) vaccine. An adult who has not previously received Tdap or who does not know his vaccine status should receive 1 dose of Tdap. This initial dose should be followed by tetanus and diphtheria toxoids (Td) booster doses every 10 years. Adults with an unknown or incomplete history of completing a 3-dose immunization series with Td-containing vaccines should begin or complete a primary immunization series including a Tdap dose. Adults should receive a Td booster every 10 years.  Varicella vaccine. An adult without evidence of immunity to varicella should receive 2 doses or a second dose if he has previously received 1 dose.  Human papillomavirus (HPV) vaccine. Males aged 68-21 years who have not received the vaccine previously should receive the 3-dose series. Males aged 22-26 years may be immunized. Immunization is recommended through the age of 6 years for any male who has sex with males and did not get any or all doses earlier. Immunization is recommended for any person with an immunocompromised condition through the age of 49 years if he did not get any or all doses earlier. During the 3-dose series, the second dose should be obtained 4-8 weeks after the first  dose. The third dose should be obtained 24 weeks after the first dose and 16 weeks after the second dose.  Zoster vaccine. One dose is recommended for adults aged 50 years or older unless certain conditions are present.  Measles, mumps, and rubella (MMR) vaccine. Adults born before [birth year] generally are considered immune to measles and mumps. Adults born in [birth year] or later should have 1 or more doses of MMR vaccine unless there is a contraindication to the vaccine or there is laboratory evidence of immunity to each of the three diseases. A routine second dose of MMR vaccine should be obtained at least 28 days after the first dose for students attending postsecondary  schools, health care workers, or international travelers. People who received inactivated measles vaccine or an unknown type of measles vaccine during 1963-1967 should receive 2 doses of MMR vaccine. People who received inactivated mumps vaccine or an unknown type of mumps vaccine before 1979 and are at high risk for mumps infection should consider immunization with 2 doses of MMR vaccine. Unvaccinated health care workers born before 1957 who lack laboratory evidence of measles, mumps, or rubella immunity or laboratory confirmation of disease should consider measles and mumps immunization with 2 doses of MMR vaccine or rubella immunization with 1 dose of MMR vaccine.  Pneumococcal 13-valent conjugate (PCV13) vaccine. When indicated, a person who is uncertain of his immunization history and has no record of immunization should receive the PCV13 vaccine. An adult aged 19 years or older who has certain medical conditions and has not been previously immunized should receive 1 dose of PCV13 vaccine. This PCV13 should be followed with a dose of pneumococcal polysaccharide (PPSV23) vaccine. The PPSV23 vaccine dose should be obtained at least 8 weeks after the dose of PCV13 vaccine. An adult aged 19 years or older who has certain medical conditions and  previously received 1 or more doses of PPSV23 vaccine should receive 1 dose of PCV13. The PCV13 vaccine dose should be obtained 1 or more years after the last PPSV23 vaccine dose.  Pneumococcal polysaccharide (PPSV23) vaccine. When PCV13 is also indicated, PCV13 should be obtained first. All adults aged 65 years and older should be immunized. An adult younger than age 65 years who has certain medical conditions should be immunized. Any person who resides in a nursing home or long-term care facility should be immunized. An adult smoker should be immunized. People with an immunocompromised condition and certain other conditions should receive both PCV13 and PPSV23 vaccines. People with human immunodeficiency virus (HIV) infection should be immunized as soon as possible after diagnosis. Immunization during chemotherapy or radiation therapy should be avoided. Routine use of PPSV23 vaccine is not recommended for American Indians, [place] Natives, or people younger than 65 years unless there are medical conditions that require PPSV23 vaccine. When indicated, people who have unknown immunization and have no record of immunization should receive PPSV23 vaccine. One-time revaccination 5 years after the first dose of PPSV23 is recommended for people aged 19-64 years who have chronic kidney failure, nephrotic syndrome, asplenia, or immunocompromised conditions. People who received 1-2 doses of PPSV23 before age 65 years should receive another dose of PPSV23 vaccine at age 65 years or later if at least 5 years have passed since the previous dose. Doses of PPSV23 are not needed for people immunized with PPSV23 at or after age 65 years.  Meningococcal vaccine. Adults with asplenia or persistent complement component deficiencies should receive 2 doses of quadrivalent meningococcal conjugate (MenACWY-D) vaccine. The doses should be obtained at least 2 months apart. Microbiologists working with certain meningococcal bacteria,  military recruits, people at risk during an outbreak, and people who travel to or live in countries with a high rate of meningitis should be immunized. A first-year college student up through age 21 years who is living in a residence hall should receive a dose if he did not receive a dose on or after his 16th birthday. Adults who have certain high-risk conditions should receive one or more doses of vaccine.  Hepatitis A vaccine. Adults who wish to be protected from this disease, have certain high-risk conditions, work with hepatitis A-infected animals, work in hepatitis A research labs, or   travel to or work in countries with a high rate of hepatitis A should be immunized. Adults who were previously unvaccinated and who anticipate close contact with an international adoptee during the first 60 days after arrival in the [place] from a country with a high rate of hepatitis A should be immunized.  Hepatitis B vaccine. Adults should be immunized if they wish to be protected from this disease, have certain high-risk conditions, may be exposed to blood or other infectious body fluids, are household contacts or sex partners of hepatitis B positive people, are clients or workers in certain care facilities, or travel to or work in countries with a high rate of hepatitis B.  Haemophilus influenzae type b (Hib) vaccine. A previously unvaccinated person with asplenia or sickle cell disease or having a scheduled splenectomy should receive 1 dose of Hib vaccine. Regardless of previous immunization, a recipient of a hematopoietic stem cell transplant should receive a 3-dose series 6-12 months after his successful transplant. Hib vaccine is not recommended for adults with HIV infection. Preventive Service / Frequency Ages 52 to 17  Blood pressure check.** / Every 1 to 2 years.  Lipid and cholesterol check.** / Every 5 years beginning at age 69.  Hepatitis C blood test.** / For any individual with known risks for  hepatitis C.  Skin self-exam. / Monthly.  Influenza vaccine. / Every year.  Tetanus, diphtheria, and acellular pertussis (Tdap, Td) vaccine.** / Consult your health care provider. 1 dose of Td every 10 years.  Varicella vaccine.** / Consult your health care provider.  HPV vaccine. / 3 doses over 6 months, if 72 or younger.  Measles, mumps, rubella (MMR) vaccine.** / You need at least 1 dose of MMR if you were born in 1957 or later. You may also need a second dose.  Pneumococcal 13-valent conjugate (PCV13) vaccine.** / Consult your health care provider.  Pneumococcal polysaccharide (PPSV23) vaccine.** / 1 to 2 doses if you smoke cigarettes or if you have certain conditions.  Meningococcal vaccine.** / 1 dose if you are age 35 to 60 years and a Market researcher living in a residence hall, or have one of several medical conditions. You may also need additional booster doses.  Hepatitis A vaccine.** / Consult your health care provider.  Hepatitis B vaccine.** / Consult your health care provider.  Haemophilus influenzae type b (Hib) vaccine.** / Consult your health care provider. Ages 35 to 8  Blood pressure check.** / Every 1 to 2 years.  Lipid and cholesterol check.** / Every 5 years beginning at age 57.  Lung cancer screening. / Every year if you are aged 44-80 years and have a 30-pack-year history of smoking and currently smoke or have quit within the past 15 years. Yearly screening is stopped once you have quit smoking for at least 15 years or develop a health problem that would prevent you from having lung cancer treatment.  Fecal occult blood test (FOBT) of stool. / Every year beginning at age 55 and continuing until age 73. You may not have to do this test if you get a colonoscopy every 10 years.  Flexible sigmoidoscopy** or colonoscopy.** / Every 5 years for a flexible sigmoidoscopy or every 10 years for a colonoscopy beginning at age 28 and continuing until age  1.  Hepatitis C blood test.** / For all people born from [birth year] through 1965 and any individual with known risks for hepatitis C.  Skin self-exam. / Monthly.  Influenza vaccine. / Every  year.  Tetanus, diphtheria, and acellular pertussis (Tdap/Td) vaccine.** / Consult your health care provider. 1 dose of Td every 10 years.  Varicella vaccine.** / Consult your health care provider.  Zoster vaccine.** / 1 dose for adults aged 53 years or older.  Measles, mumps, rubella (MMR) vaccine.** / You need at least 1 dose of MMR if you were born in 1957 or later. You may also need a second dose.  Pneumococcal 13-valent conjugate (PCV13) vaccine.** / Consult your health care provider.  Pneumococcal polysaccharide (PPSV23) vaccine.** / 1 to 2 doses if you smoke cigarettes or if you have certain conditions.  Meningococcal vaccine.** / Consult your health care provider.  Hepatitis A vaccine.** / Consult your health care provider.  Hepatitis B vaccine.** / Consult your health care provider.  Haemophilus influenzae type b (Hib) vaccine.** / Consult your health care provider. Ages 77 and over  Blood pressure check.** / Every 1 to 2 years.  Lipid and cholesterol check.**/ Every 5 years beginning at age 85.  Lung cancer screening. / Every year if you are aged 55-80 years and have a 30-pack-year history of smoking and currently smoke or have quit within the past 15 years. Yearly screening is stopped once you have quit smoking for at least 15 years or develop a health problem that would prevent you from having lung cancer treatment.  Fecal occult blood test (FOBT) of stool. / Every year beginning at age 33 and continuing until age 11. You may not have to do this test if you get a colonoscopy every 10 years.  Flexible sigmoidoscopy** or colonoscopy.** / Every 5 years for a flexible sigmoidoscopy or every 10 years for a colonoscopy beginning at age 28 and continuing until age 73.  Hepatitis C blood  test.** / For all people born from [birth year] through 1965 and any individual with known risks for hepatitis C.  Abdominal aortic aneurysm (AAA) screening.** / A one-time screening for ages 50 to 27 years who are current or former smokers.  Skin self-exam. / Monthly.  Influenza vaccine. / Every year.  Tetanus, diphtheria, and acellular pertussis (Tdap/Td) vaccine.** / 1 dose of Td every 10 years.  Varicella vaccine.** / Consult your health care provider.  Zoster vaccine.** / 1 dose for adults aged 34 years or older.  Pneumococcal 13-valent conjugate (PCV13) vaccine.** / Consult your health care provider.  Pneumococcal polysaccharide (PPSV23) vaccine.** / 1 dose for all adults aged 63 years and older.  Meningococcal vaccine.** / Consult your health care provider.  Hepatitis A vaccine.** / Consult your health care provider.  Hepatitis B vaccine.** / Consult your health care provider.  Haemophilus influenzae type b (Hib) vaccine.** / Consult your health care provider. **Family history and personal history of risk and conditions may change your health care provider's recommendations. Document Released: 11/22/2001 Document Revised: 10/01/2013 Document Reviewed: 02/21/2011 [facility] Patient Information 2015 [place], [place]. This information is not intended to replace advice given to you by your health care provider. Make sure you discuss any questions you have with your health care provider.

## 2014-09-10 LAB — LIPID PANEL
CHOLESTEROL: 122 mg/dL (ref 0–200)
HDL: 46.7 mg/dL (ref >39.00)
LDL Cholesterol: 64 mg/dL (ref 0–99)
NonHDL: 75.3
Total CHOL/HDL Ratio: 3
Triglycerides: 58 mg/dL (ref 0.0–149.0)
VLDL: 11.6 mg/dL (ref 0.0–40.0)

## 2014-09-10 LAB — BASIC METABOLIC PANEL
BUN: 25 mg/dL — ABNORMAL HIGH (ref 6–23)
CHLORIDE: 107 meq/L (ref 96–112)
CO2: 28 mEq/L (ref 19–32)
Calcium: 9.3 mg/dL (ref 8.4–10.5)
Creatinine, Ser: 1.3 mg/dL (ref 0.4–1.5)
GFR: 58.42 mL/min — AB (ref >60.00)
Glucose, Bld: 107 mg/dL — ABNORMAL HIGH (ref 70–99)
Potassium: 4.8 mEq/L (ref 3.5–5.1)
SODIUM: 141 meq/L (ref 135–145)

## 2014-09-10 LAB — HEPATIC FUNCTION PANEL
ALBUMIN: 4.2 g/dL (ref 3.5–5.2)
ALT: 26 U/L (ref 0–53)
AST: 27 U/L (ref 0–37)
Alkaline Phosphatase: 62 U/L (ref 39–117)
Bilirubin, Direct: 0.2 mg/dL (ref 0.0–0.3)
Total Bilirubin: 1.1 mg/dL (ref 0.2–1.2)
Total Protein: 7.1 g/dL (ref 6.0–8.3)

## 2014-09-15 ENCOUNTER — Other Ambulatory Visit: Payer: Self-pay

## 2014-09-15 DIAGNOSIS — H9193 Unspecified hearing loss, bilateral: Secondary | ICD-10-CM | POA: Diagnosis not present

## 2014-09-15 DIAGNOSIS — H905 Unspecified sensorineural hearing loss: Secondary | ICD-10-CM | POA: Diagnosis not present

## 2014-09-15 DIAGNOSIS — H9312 Tinnitus, left ear: Secondary | ICD-10-CM | POA: Diagnosis not present

## 2014-09-15 DIAGNOSIS — R809 Proteinuria, unspecified: Secondary | ICD-10-CM

## 2014-09-15 DIAGNOSIS — H9313 Tinnitus, bilateral: Secondary | ICD-10-CM | POA: Diagnosis not present

## 2014-09-16 ENCOUNTER — Encounter: Payer: Self-pay | Admitting: Nurse Practitioner

## 2014-09-16 ENCOUNTER — Ambulatory Visit (INDEPENDENT_AMBULATORY_CARE_PROVIDER_SITE_OTHER): Payer: Medicare Other | Admitting: Nurse Practitioner

## 2014-09-16 VITALS — BP 110/70 | HR 68 | Ht 69.0 in | Wt 181.2 lb

## 2014-09-16 DIAGNOSIS — R12 Heartburn: Secondary | ICD-10-CM | POA: Diagnosis not present

## 2014-09-16 DIAGNOSIS — I2510 Atherosclerotic heart disease of native coronary artery without angina pectoris: Secondary | ICD-10-CM | POA: Diagnosis not present

## 2014-09-16 NOTE — Progress Notes (Signed)
Reviewed and agree with management plan.  Zilphia Kozinski T. Cassara Nida, MD FACG 

## 2014-09-16 NOTE — Progress Notes (Signed)
HPI :   Patient is a 68 year old male known to Dr. Fuller Plan. He had a colonoscopy October 2011 for evaluation of Hemoccult-positive stool. Exam findings included a 3 cm sessile polyp in the hepatic flexure as well as internal hemorrhoids. Polyp pathology compatible with an adenoma. Repeat colonoscopy recommended at 5 years.  Patient is referred today for evaluation of heartburn. Several weeks ago patient experienced about 7 days of postprandial heartburn relieved with Tums. He's had no further heartburn(off Tums) for at least 2 weeks now. Patient was never clear if the trigger such as spicy foods or other. He has no other gastrointestinal: Plaints. No dysphagia. Weight is stable. Bowel movements are normal. PCP had given patient omeprazole to try for the heartburn. After reading potential side effects patient decided to go with Tums instead   Past Medical History  Diagnosis Date  . CAD (coronary artery disease)     s/p PTCI in 2003 and 2004 to the LAD.   . Diabetes mellitus   . Hypertension   . Hyperlipidemia   . Hypothyroid   . Arthritis   . Depression   . Internal hemorrhoids   . Thrombocytopenia   . TIA (transient ischemic attack)     per patient's report.  He was never officially given this diagnosis  . Migraine headache   . Skin cancer, basal cell     Family History  Problem Relation Age of Onset  . Mental illness Mother     alzheimers  . Heart disease Mother   . Heart disease Father     triple by pass  . Hypertension Brother     Prostate Cancer  . Hypertension Brother   . Hypertension Brother   . COPD Brother    History  Substance Use Topics  . Smoking status: Former Smoker -- 5 years    Types: Cigarettes    Quit date: 10/10/1965  . Smokeless tobacco: Never Used  . Alcohol Use: No   Current Outpatient Prescriptions  Medication Sig Dispense Refill  . aspirin 81 MG tablet Take 81 mg by mouth daily.      . Biotin 1000 MCG tablet Take 1,000 mcg by mouth daily.      . celecoxib (CELEBREX) 200 MG capsule Take 200 mg by mouth daily as needed.     . Cinnamon 500 MG capsule Take 500 mg by mouth 2 (two) times daily.     . Coenzyme Q10 (COQ-10) 100 MG capsule Take 100 mg by mouth 2 (two) times daily.     . Diclofenac Sodium (SOLARAZE) 3 % GEL Place 1 application onto the skin 2 (two) times daily.    Marland Kitchen ezetimibe-simvastatin (VYTORIN) 10-20 MG per tablet Take 1 tablet by mouth at bedtime.      . Ginger, Zingiber officinalis, (GINGER ROOT) 550 MG CAPS Take 1 capsule by mouth daily.      Marland Kitchen levothyroxine (SYNTHROID, LEVOTHROID) 112 MCG tablet Take 112 mcg by mouth daily.      Marland Kitchen losartan-hydrochlorothiazide (HYZAAR) 100-12.5 MG per tablet Take 1 tablet by mouth daily. 90 tablet 0  . metFORMIN (GLUMETZA) 500 MG (MOD) 24 hr tablet Take 1,000 mg by mouth daily.     . metoprolol succinate (TOPROL-XL) 50 MG 24 hr tablet Take 1 tablet (50 mg total) by mouth daily. Take with or immediately following a meal. 30 tablet 12  . multivitamin (THERAGRAN) per tablet Take 1 tablet by mouth daily.      . NON FORMULARY Take 1 capsule  by mouth daily. ( Tumeric 500 mg )     . Omega-3 Fatty Acids (FISH OIL) 1200 MG CAPS Take 2 capsules by mouth 2 (two) times daily.      . Policosanol 10 MG CAPS Take 10 mg by mouth daily.    . Tamsulosin HCl (FLOMAX) 0.4 MG CAPS Take 0.4 mg by mouth daily.      . traMADol (ULTRAM) 50 MG tablet Take 50 mg by mouth as needed. For headache    . nitroGLYCERIN (NITROSTAT) 0.4 MG SL tablet Place 0.4 mg under the tongue every 5 (five) minutes as needed.      Marland Kitchen omeprazole (PRILOSEC) 40 MG capsule Take 1 capsule (40 mg total) by mouth daily. (Patient not taking: Reported on 09/16/2014) 30 capsule 11   No current facility-administered medications for this visit.   Allergies  Allergen Reactions  . Codeine   . Crestor [Rosuvastatin Calcium]   . Hydrocodone-Acetaminophen   . Rosuvastatin     REACTION: REALLY BAD JOINT/MUSCLE PAIN     Review of  Systems: Positive for back pain, headaches, hearing problems and urinary frequency . All other systems reviewed and negative except where noted in HPI.   Physical Exam: BP 110/70 mmHg  Pulse 68  Ht 5\' 9"  (1.753 m)  Wt 181 lb 3.2 oz (82.192 kg)  BMI 26.75 kg/m2 Constitutional: Pleasant,well-developed, white male in no acute distress. HEENT: Normocephalic and atraumatic. Conjunctivae are normal. No scleral icterus. Neck supple.  Cardiovascular: Normal rate, regular rhythm.  Pulmonary/chest: Effort normal and breath sounds normal. No wheezing, rales or rhonchi. Abdominal: Soft, nondistended, nontender. Bowel sounds active throughout. There are no masses palpable. No hepatomegaly. Extremities: no edema Lymphadenopathy: No cervical adenopathy noted. Neurological: Alert and oriented to person place and time. Skin: Skin is warm and dry. No rashes noted. Psychiatric: Normal mood and affect. Behavior is normal.   ASSESSMENT AND PLAN:  60. 68 year old male with recent heartburn. Symptoms were transient, relieved with 2 times daily. Patient's not had any further heartburn in 2 weeks (off Tums) disease. He has no dysphagia or other GI complaints patient did not start PPI as prescribed by PCP due to concern for potential side effects. I reviewed basic labs which were unremarkable except for platelet count, see #3  At this point no workup is warranted. Patient will contact us if he has recurrent heartburn. He knows to look for extra intestinal manifestations of GERD such as cough or globus sensation. We did discuss antireflux measures as well as certain potential triggers such as chocolate, mints and coffee  2. History of adenomatous colon polyps, he is up-to-date on screening colonoscopy  3. Chronic thrombocytopenia, actually improved. Count at 169. Patient previously evaluated by hematology who felt thrombocytopenia was possibly secondary to medications

## 2014-09-16 NOTE — Patient Instructions (Signed)
Please call us if you have any problems.

## 2014-09-23 ENCOUNTER — Other Ambulatory Visit: Payer: Medicare Other

## 2014-09-23 DIAGNOSIS — R809 Proteinuria, unspecified: Secondary | ICD-10-CM

## 2014-09-23 DIAGNOSIS — H5203 Hypermetropia, bilateral: Secondary | ICD-10-CM | POA: Diagnosis not present

## 2014-09-23 DIAGNOSIS — H2513 Age-related nuclear cataract, bilateral: Secondary | ICD-10-CM | POA: Diagnosis not present

## 2014-09-23 DIAGNOSIS — E119 Type 2 diabetes mellitus without complications: Secondary | ICD-10-CM | POA: Diagnosis not present

## 2014-09-23 LAB — PROTEIN, URINE, 24 HOUR
Protein, 24H Urine: 98 mg/d (ref <150)
Protein, Urine: 4 mg/dL — ABNORMAL LOW (ref 5–25)

## 2014-09-30 ENCOUNTER — Other Ambulatory Visit: Payer: Self-pay | Admitting: *Deleted

## 2014-09-30 MED ORDER — METOPROLOL SUCCINATE ER 50 MG PO TB24: 50.0000 mg | ORAL_TABLET | Freq: Every day | ORAL | Status: DC

## 2014-10-07 DIAGNOSIS — E039 Hypothyroidism, unspecified: Secondary | ICD-10-CM | POA: Diagnosis not present

## 2014-10-07 DIAGNOSIS — E118 Type 2 diabetes mellitus with unspecified complications: Secondary | ICD-10-CM | POA: Diagnosis not present

## 2014-10-07 DIAGNOSIS — I10 Essential (primary) hypertension: Secondary | ICD-10-CM | POA: Diagnosis not present

## 2014-10-13 ENCOUNTER — Ambulatory Visit (INDEPENDENT_AMBULATORY_CARE_PROVIDER_SITE_OTHER): Payer: Medicare Other | Admitting: Family Medicine

## 2014-10-13 ENCOUNTER — Encounter: Payer: Self-pay | Admitting: Family Medicine

## 2014-10-13 VITALS — BP 124/76 | HR 72 | Temp 97.8°F | Wt 181.8 lb

## 2014-10-13 DIAGNOSIS — J0110 Acute frontal sinusitis, unspecified: Secondary | ICD-10-CM | POA: Diagnosis not present

## 2014-10-13 MED ORDER — AMOXICILLIN-POT CLAVULANATE 875-125 MG PO TABS: 1.0000 | ORAL_TABLET | Freq: Two times a day (BID) | ORAL | Status: DC

## 2014-10-13 MED ORDER — FLUTICASONE PROPIONATE 50 MCG/ACT NA SUSP: 2.0000 | Freq: Every day | NASAL | Status: DC

## 2014-10-13 NOTE — Progress Notes (Signed)
  Subjective:     [name] is a 69 y.o. male who presents for evaluation of sinus pain. Symptoms include: congestion, cough, facial pain, headaches, nasal congestion, post nasal drip, sinus pressure and tooth pain. Onset of symptoms was 2 weeks ago. Symptoms have been gradually worsening since that time. Past history is significant for no history of pneumonia or bronchitis. Patient is a non-smoker.  The following portions of the patient's history were reviewed and updated as appropriate: allergies, current medications, past family history, past medical history, past social history, past surgical history and problem list.  Review of Systems Pertinent items are noted in HPI.   Objective:    BP 124/76 mmHg  Pulse 72  Temp(Src) 97.8 F (36.6 C) (Oral)  Wt 181 lb 12.8 oz (82.464 kg)  SpO2 96% General appearance: alert, cooperative, appears stated age and no distress Ears: normal TM's and external ear canals both ears Nose: green discharge, moderate congestion, turbinates red, swollen, sinus tenderness bilateral Throat: abnormal findings: mild oropharyngeal erythema and pnd Neck: mild anterior cervical adenopathy, supple, symmetrical, trachea midline and thyroid not enlarged, symmetric, no tenderness/mass/nodules Lungs: clear to auscultation bilaterally Heart: S1, S2 normal    Assessment:    Acute bacterial sinusitis.    Plan:    Nasal saline sprays. Nasal steroids per medication orders. Antihistamines per medication orders. Augmentin per medication orders. f/u prn

## 2014-10-13 NOTE — Patient Instructions (Signed)

## 2014-10-13 NOTE — Progress Notes (Signed)
Pre visit review using our clinic review tool, if applicable. No additional management support is needed unless otherwise documented below in the visit note. 

## 2014-10-23 DIAGNOSIS — R972 Elevated prostate specific antigen [PSA]: Secondary | ICD-10-CM | POA: Diagnosis not present

## 2014-11-05 DIAGNOSIS — N5201 Erectile dysfunction due to arterial insufficiency: Secondary | ICD-10-CM | POA: Diagnosis not present

## 2014-11-05 DIAGNOSIS — R972 Elevated prostate specific antigen [PSA]: Secondary | ICD-10-CM | POA: Diagnosis not present

## 2014-11-05 DIAGNOSIS — R3916 Straining to void: Secondary | ICD-10-CM | POA: Diagnosis not present

## 2014-11-05 DIAGNOSIS — N401 Enlarged prostate with lower urinary tract symptoms: Secondary | ICD-10-CM | POA: Diagnosis not present

## 2014-11-05 NOTE — Progress Notes (Signed)
HPI: FU CAD with prior PCI of his LAD in May 2003. His last cardiac catheterization was performed on December 16, 2008 by Dr. Olevia Perches for exertional chest pain. He was found to have a 30-40% LAD just distal to the stent. There was no other obstructive disease noted. The ejection fraction is 50%. He has been treated medically. Abdominal ultrasound in Oct 2011 showed no aneurysm. Nuclear study in January of 2014 showed an ejection fraction of 61%. There was no ischemia. Since I last saw him, the patient denies any dyspnea on exertion, orthopnea, PND, pedal edema, palpitations, syncope or chest pain.   Current Outpatient Prescriptions  Medication Sig Dispense Refill  . aspirin 81 MG tablet Take 81 mg by mouth daily.      Marland Kitchen b complex vitamins tablet Take 1 tablet by mouth daily.    . Biotin 1000 MCG tablet Take 1,000 mcg by mouth daily.     . celecoxib (CELEBREX) 200 MG capsule Take 200 mg by mouth daily as needed.     . Cinnamon 500 MG capsule Take 1,000 mg by mouth 2 (two) times daily.     . Coenzyme Q10 (COQ-10) 100 MG capsule Take 100 mg by mouth 2 (two) times daily.     . Diclofenac Sodium (SOLARAZE) 3 % GEL Place 1 application onto the skin 2 (two) times daily.    Marland Kitchen ezetimibe-simvastatin (VYTORIN) 10-20 MG per tablet Take 1 tablet by mouth at bedtime.      . fluticasone (FLONASE) 50 MCG/ACT nasal spray Place 2 sprays into both nostrils daily. (Patient taking differently: Place 2 sprays into both nostrils daily as needed. ) 16 g 6  . Ginger, Zingiber officinalis, (GINGER ROOT) 550 MG CAPS Take 1 capsule by mouth daily.      Marland Kitchen levothyroxine (SYNTHROID, LEVOTHROID) 112 MCG tablet Take 112 mcg by mouth daily.      Marland Kitchen losartan-hydrochlorothiazide (HYZAAR) 100-12.5 MG per tablet Take 1 tablet by mouth daily. 90 tablet 0  . metFORMIN (GLUMETZA) 500 MG (MOD) 24 hr tablet Take 500 mg by mouth 2 (two) times daily with a meal.     . metoprolol succinate (TOPROL-XL) 50 MG 24 hr tablet Take 1 tablet (50  mg total) by mouth daily. Take with or immediately following a meal. 30 tablet 0  . multivitamin (THERAGRAN) per tablet Take 1 tablet by mouth daily.      . nitroGLYCERIN (NITROSTAT) 0.4 MG SL tablet Place 0.4 mg under the tongue every 5 (five) minutes as needed.      . NON FORMULARY Take 1 capsule by mouth daily. ( Tumeric 500 mg )     . Omega-3 Fatty Acids (FISH OIL) 1200 MG CAPS Take 2 capsules by mouth 2 (two) times daily.      . Tamsulosin HCl (FLOMAX) 0.4 MG CAPS Take 0.4 mg by mouth daily.      Marland Kitchen amoxicillin-clavulanate (AUGMENTIN) 875-125 MG per tablet Take 1 tablet by mouth 2 (two) times daily. (Patient not taking: Reported on 11/10/2014) 20 tablet 0   No current facility-administered medications for this visit.     Past Medical History  Diagnosis Date  . CAD (coronary artery disease)     s/p PTCI in 2003 and 2004 to the LAD.   . Diabetes mellitus   . Hypertension   . Hyperlipidemia   . Hypothyroid   . Arthritis   . Depression   . Internal hemorrhoids   . Thrombocytopenia   . TIA (  transient ischemic attack)     per patient's report.  He was never officially given this diagnosis  . Migraine headache   . Skin cancer, basal cell   . Thrombocytopenia, unspecified 05/08/2013    Past Surgical History  Procedure Laterality Date  . Hernia repair    . Tosillectomy    . Thyroidectomy, partial  07/2002    right thyroid  . Coronary artery disease status post placement      of drug-eluting stent in the LAD in 2003,eEF 65% then  . Right toe bone spur surgery      History   Social History  . Marital Status: Married    Spouse Name: N/A    Number of Children: 0  . Years of Education: N/A   Occupational History  . retired     Optometrist   Social History Main Topics  . Smoking status: Former Smoker -- 5 years    Types: Cigarettes    Quit date: 10/10/1965  . Smokeless tobacco: Never Used  . Alcohol Use: No  . Drug Use: No  . Sexual Activity:    Partners: Female    Other Topics Concern  . Not on file   Social History Narrative   Exercise-- treadmill, stationary bike       ROS: no fevers or chills, productive cough, hemoptysis, dysphasia, odynophagia, melena, hematochezia, dysuria, hematuria, rash, seizure activity, orthopnea, PND, pedal edema, claudication. Remaining systems are negative.  Physical Exam: Well-developed well-nourished in no acute distress.  Skin is warm and dry.  HEENT is normal.  Neck is supple.  Chest is clear to auscultation with normal expansion.  Cardiovascular exam is regular rate and rhythm.  Abdominal exam nontender or distended. No masses palpated. Extremities show no edema. neuro grossly intact  ECG sinus rhythm at a rate of 68. Left ventricular hypertrophy.

## 2014-11-10 ENCOUNTER — Encounter: Payer: Self-pay | Admitting: Cardiology

## 2014-11-10 ENCOUNTER — Ambulatory Visit (INDEPENDENT_AMBULATORY_CARE_PROVIDER_SITE_OTHER): Payer: Medicare Other | Admitting: Cardiology

## 2014-11-10 DIAGNOSIS — I2510 Atherosclerotic heart disease of native coronary artery without angina pectoris: Secondary | ICD-10-CM

## 2014-11-10 DIAGNOSIS — E785 Hyperlipidemia, unspecified: Secondary | ICD-10-CM

## 2014-11-10 MED ORDER — LOSARTAN POTASSIUM-HCTZ 100-12.5 MG PO TABS: 1.0000 | ORAL_TABLET | Freq: Every day | ORAL | Status: DC

## 2014-11-10 MED ORDER — METOPROLOL SUCCINATE ER 50 MG PO TB24: 50.0000 mg | ORAL_TABLET | Freq: Every day | ORAL | Status: DC

## 2014-11-10 NOTE — Assessment & Plan Note (Signed)
Blood pressure controlled. Continue present medications. 

## 2014-11-10 NOTE — Assessment & Plan Note (Signed)
Continue statin. 

## 2014-11-10 NOTE — Patient Instructions (Signed)
Your physician wants you to follow-up in: ONE YEAR WITH DR CRENSHAW You will receive a reminder letter in the mail two months in advance. If you don't receive a letter, please call our office to schedule the follow-up appointment.  

## 2014-11-10 NOTE — Assessment & Plan Note (Signed)
Continue aspirin and statin. 

## 2014-12-24 ENCOUNTER — Other Ambulatory Visit: Payer: Self-pay

## 2014-12-24 DIAGNOSIS — E785 Hyperlipidemia, unspecified: Secondary | ICD-10-CM

## 2014-12-24 DIAGNOSIS — I2510 Atherosclerotic heart disease of native coronary artery without angina pectoris: Secondary | ICD-10-CM

## 2014-12-24 MED ORDER — LOSARTAN POTASSIUM-HCTZ 100-12.5 MG PO TABS: 1.0000 | ORAL_TABLET | Freq: Every day | ORAL | Status: DC

## 2015-03-13 ENCOUNTER — Ambulatory Visit: Payer: Medicare Other | Admitting: Family Medicine

## 2015-03-16 ENCOUNTER — Encounter: Payer: Self-pay | Admitting: Family Medicine

## 2015-03-16 ENCOUNTER — Ambulatory Visit (INDEPENDENT_AMBULATORY_CARE_PROVIDER_SITE_OTHER): Payer: Medicare Other | Admitting: Family Medicine

## 2015-03-16 VITALS — BP 142/86 | HR 86 | Temp 98.0°F | Resp 18 | Wt 173.0 lb

## 2015-03-16 DIAGNOSIS — I10 Essential (primary) hypertension: Secondary | ICD-10-CM

## 2015-03-16 DIAGNOSIS — G44001 Cluster headache syndrome, unspecified, intractable: Secondary | ICD-10-CM

## 2015-03-16 DIAGNOSIS — E785 Hyperlipidemia, unspecified: Secondary | ICD-10-CM

## 2015-03-16 DIAGNOSIS — N400 Enlarged prostate without lower urinary tract symptoms: Secondary | ICD-10-CM | POA: Diagnosis not present

## 2015-03-16 DIAGNOSIS — I2510 Atherosclerotic heart disease of native coronary artery without angina pectoris: Secondary | ICD-10-CM | POA: Diagnosis not present

## 2015-03-16 LAB — LIPID PANEL
CHOLESTEROL: 121 mg/dL (ref 0–200)
HDL: 55 mg/dL (ref >39.00)
LDL CALC: 50 mg/dL (ref 0–99)
NONHDL: 66
Total CHOL/HDL Ratio: 2
Triglycerides: 81 mg/dL (ref 0.0–149.0)
VLDL: 16.2 mg/dL (ref 0.0–40.0)

## 2015-03-16 LAB — BASIC METABOLIC PANEL
BUN: 18 mg/dL (ref 6–23)
CALCIUM: 8.9 mg/dL (ref 8.4–10.5)
CO2: 28 mEq/L (ref 19–32)
CREATININE: 1.07 mg/dL (ref 0.40–1.50)
Chloride: 107 mEq/L (ref 96–112)
GFR: 72.38 mL/min (ref >60.00)
Glucose, Bld: 79 mg/dL (ref 70–99)
POTASSIUM: 4 meq/L (ref 3.5–5.1)
Sodium: 138 mEq/L (ref 135–145)

## 2015-03-16 LAB — HEPATIC FUNCTION PANEL
ALBUMIN: 3.9 g/dL (ref 3.5–5.2)
ALT: 30 U/L (ref 0–53)
AST: 25 U/L (ref 0–37)
Alkaline Phosphatase: 62 U/L (ref 39–117)
Bilirubin, Direct: 0.2 mg/dL (ref 0.0–0.3)
TOTAL PROTEIN: 6.7 g/dL (ref 6.0–8.3)
Total Bilirubin: 0.7 mg/dL (ref 0.2–1.2)

## 2015-03-16 MED ORDER — METOPROLOL SUCCINATE ER 100 MG PO TB24
100.0000 mg | ORAL_TABLET | Freq: Every day | ORAL | Status: DC
Start: 2015-03-16 — End: 2015-11-13

## 2015-03-16 NOTE — Progress Notes (Signed)
Pre visit review using our clinic review tool, if applicable. No additional management support is needed unless otherwise documented below in the visit note. 

## 2015-03-16 NOTE — Patient Instructions (Signed)

## 2015-03-16 NOTE — Progress Notes (Signed)
Patient ID: [name], male    DOB: 06-23-1946  Age: 69 y.o. MRN: [id number]    Subjective:  Subjective HPI [name] presents for f/u bp and c/o bp running high over last month as well as inc headaches.  No cp or sob.    Review of Systems  Constitutional: Negative for fatigue and unexpected weight change.  Respiratory: Negative for cough and shortness of breath.   Cardiovascular: Negative for chest pain and palpitations.  Psychiatric/Behavioral: Negative for decreased concentration. The patient is not nervous/anxious.     History Past Medical History  Diagnosis Date  . CAD (coronary artery disease)     s/p PTCI in 2003 and 2004 to the LAD.   . Diabetes mellitus   . Hypertension   . Hyperlipidemia   . Hypothyroid   . Arthritis   . Depression   . Internal hemorrhoids   . Thrombocytopenia   . TIA (transient ischemic attack)     per patient's report.  He was never officially given this diagnosis  . Migraine headache   . Skin cancer, basal cell   . Thrombocytopenia, unspecified 05/08/2013    He has past surgical history that includes Hernia repair; tosillectomy; Thyroidectomy, partial (07/2002); coronary artery disease status post placement; and right toe bone spur surgery.   His family history includes COPD in his brother; Heart disease in his father and mother; Hypertension in his brother, brother, and brother; Mental illness in his mother.He reports that he quit smoking about 49 years ago. His smoking use included Cigarettes. He quit after 5 years of use. He has never used smokeless tobacco. He reports that he does not drink alcohol or use illicit drugs.  Current Outpatient Prescriptions on File Prior to Visit  Medication Sig Dispense Refill  . aspirin 81 MG tablet Take 81 mg by mouth daily.      Marland Kitchen b complex vitamins tablet Take 1 tablet by mouth daily.    . Biotin 1000 MCG tablet Take 1,000 mcg by mouth daily.     . celecoxib (CELEBREX) 200 MG capsule Take 200 mg by  mouth daily as needed.     . Cinnamon 500 MG capsule Take 1,000 mg by mouth 2 (two) times daily.     . Coenzyme Q10 (COQ-10) 100 MG capsule Take 100 mg by mouth 2 (two) times daily.     . Diclofenac Sodium (SOLARAZE) 3 % GEL Place 1 application onto the skin 2 (two) times daily.    Marland Kitchen ezetimibe-simvastatin (VYTORIN) 10-20 MG per tablet Take 1 tablet by mouth at bedtime.      . Ginger, Zingiber officinalis, (GINGER ROOT) 550 MG CAPS Take 1 capsule by mouth daily.      Marland Kitchen levothyroxine (SYNTHROID, LEVOTHROID) 112 MCG tablet Take 112 mcg by mouth daily.      Marland Kitchen losartan-hydrochlorothiazide (HYZAAR) 100-12.5 MG per tablet Take 1 tablet by mouth daily. 30 tablet 11  . metFORMIN (GLUMETZA) 500 MG (MOD) 24 hr tablet Take 500 mg by mouth 2 (two) times daily with a meal.     . multivitamin (THERAGRAN) per tablet Take 1 tablet by mouth daily.      . nitroGLYCERIN (NITROSTAT) 0.4 MG SL tablet Place 0.4 mg under the tongue every 5 (five) minutes as needed.      . NON FORMULARY Take 1 capsule by mouth daily. ( Tumeric 500 mg )     . Omega-3 Fatty Acids (FISH OIL) 1200 MG CAPS Take 2 capsules by mouth  2 (two) times daily.      . Tamsulosin HCl (FLOMAX) 0.4 MG CAPS Take 0.4 mg by mouth daily.       No current facility-administered medications on file prior to visit.     Objective:  Objective Physical Exam  Constitutional: He is oriented to person, place, and time. Vital signs are normal. He appears well-developed and well-nourished. He is sleeping.  HENT:  Head: Normocephalic and atraumatic.  Mouth/Throat: Oropharynx is clear and moist.  Eyes: EOM are normal. Pupils are equal, round, and reactive to light.  Neck: Normal range of motion. Neck supple. No thyromegaly present.  Cardiovascular: Normal rate and regular rhythm.   No murmur heard. Pulmonary/Chest: Effort normal and breath sounds normal. No respiratory distress. He has no wheezes. He has no rales. He exhibits no tenderness.  Musculoskeletal: He  exhibits no edema or tenderness.  Neurological: He is alert and oriented to person, place, and time.  Skin: Skin is warm and dry.  Psychiatric: He has a normal mood and affect. His behavior is normal. Judgment and thought content normal.  Nursing note and vitals reviewed.  BP 142/86 mmHg  Pulse 86  Temp(Src) 98 F (36.7 C) (Oral)  Resp 18  Wt 173 lb (78.472 kg)  SpO2 98% Wt Readings from Last 3 Encounters:  03/16/15 173 lb (78.472 kg)  11/10/14 179 lb 3.2 oz (81.285 kg)  10/13/14 181 lb 12.8 oz (82.464 kg)     Lab Results  Component Value Date   WBC 5.5 09/09/2014   HGB 17.5* 09/09/2014   HCT 52.0 09/09/2014   PLT 169.0 09/09/2014   GLUCOSE 79 03/16/2015   CHOL 121 03/16/2015   TRIG 81.0 03/16/2015   HDL 55.00 03/16/2015   LDLCALC 50 03/16/2015   ALT 30 03/16/2015   AST 25 03/16/2015   NA 138 03/16/2015   K 4.0 03/16/2015   CL 107 03/16/2015   CREATININE 1.07 03/16/2015   BUN 18 03/16/2015   CO2 28 03/16/2015   TSH 1.182 11/23/2011   PSA 2.64 09/09/2014   INR 1.0 RATIO 12/11/2008   HGBA1C 5.3 09/09/2014   MICROALBUR 2.3* 09/09/2014    No results found.   Assessment & Plan:  Plan I have discontinued Mr. Tulloch amoxicillin-clavulanate, fluticasone, and metoprolol succinate. I am also having him start on metoprolol succinate. Additionally, I am having him maintain his aspirin, Biotin, multivitamin, Fish Oil, CoQ-10, Cinnamon, Ginger Root, NON FORMULARY, ezetimibe-simvastatin, levothyroxine, tamsulosin, celecoxib, metFORMIN, nitroGLYCERIN, Diclofenac Sodium, b complex vitamins, and losartan-hydrochlorothiazide.  Meds ordered this encounter  Medications  . metoprolol succinate (TOPROL-XL) 100 MG 24 hr tablet    Sig: Take 1 tablet (100 mg total) by mouth daily. Take with or immediately following a meal.    Dispense:  90 tablet    Refill:  3    Problem List Items Addressed This Visit    Hyperlipidemia   Relevant Medications   metoprolol succinate (TOPROL-XL)  100 MG 24 hr tablet   Other Relevant Orders   Basic metabolic panel (Completed)   Hepatic function panel (Completed)   Lipid panel (Completed)    Other Visit Diagnoses    Essential hypertension    -  Primary    Relevant Medications    metoprolol succinate (TOPROL-XL) 100 MG 24 hr tablet    Other Relevant Orders    Basic metabolic panel (Completed)    Hepatic function panel (Completed)    Lipid panel (Completed)    Intractable cluster headache syndrome, unspecified chronicity pattern  Relevant Medications    metoprolol succinate (TOPROL-XL) 100 MG 24 hr tablet    BPH (benign prostatic hypertrophy)           Follow-up: Return in about 6 months (around 09/15/2015), or if symptoms worsen or fail to improve, for hyperlipidemia, hypertension, fasting, annual exam.  Garnet Koyanagi, DO

## 2015-03-21 DIAGNOSIS — J029 Acute pharyngitis, unspecified: Secondary | ICD-10-CM | POA: Diagnosis not present

## 2015-03-25 DIAGNOSIS — D225 Melanocytic nevi of trunk: Secondary | ICD-10-CM | POA: Diagnosis not present

## 2015-03-25 DIAGNOSIS — L814 Other melanin hyperpigmentation: Secondary | ICD-10-CM | POA: Diagnosis not present

## 2015-03-25 DIAGNOSIS — L570 Actinic keratosis: Secondary | ICD-10-CM | POA: Diagnosis not present

## 2015-03-25 DIAGNOSIS — L821 Other seborrheic keratosis: Secondary | ICD-10-CM | POA: Diagnosis not present

## 2015-03-26 ENCOUNTER — Ambulatory Visit (INDEPENDENT_AMBULATORY_CARE_PROVIDER_SITE_OTHER): Payer: Medicare Other | Admitting: Medical

## 2015-03-26 ENCOUNTER — Ambulatory Visit (HOSPITAL_BASED_OUTPATIENT_CLINIC_OR_DEPARTMENT_OTHER)
Admission: RE | Admit: 2015-03-26 | Discharge: 2015-03-26 | Disposition: A | Discharge status: Home / Self Care | Payer: Medicare Other | Source: Ambulatory Visit | Attending: Medical | Admitting: Medical

## 2015-03-26 ENCOUNTER — Encounter: Payer: Self-pay | Admitting: Medical

## 2015-03-26 VITALS — BP 149/76 | HR 78 | Temp 98.5°F | Ht 70.0 in | Wt 170.6 lb

## 2015-03-26 DIAGNOSIS — M7712 Lateral epicondylitis, left elbow: Secondary | ICD-10-CM | POA: Insufficient documentation

## 2015-03-26 DIAGNOSIS — IMO0002 Reserved for concepts with insufficient information to code with codable children: Secondary | ICD-10-CM

## 2015-03-26 DIAGNOSIS — M19022 Primary osteoarthritis, left elbow: Secondary | ICD-10-CM | POA: Diagnosis not present

## 2015-03-26 DIAGNOSIS — J019 Acute sinusitis, unspecified: Secondary | ICD-10-CM | POA: Insufficient documentation

## 2015-03-26 DIAGNOSIS — J029 Acute pharyngitis, unspecified: Secondary | ICD-10-CM

## 2015-03-26 DIAGNOSIS — J0100 Acute maxillary sinusitis, unspecified: Secondary | ICD-10-CM | POA: Diagnosis not present

## 2015-03-26 DIAGNOSIS — I2510 Atherosclerotic heart disease of native coronary artery without angina pectoris: Secondary | ICD-10-CM

## 2015-03-26 DIAGNOSIS — M7710 Lateral epicondylitis, unspecified elbow: Secondary | ICD-10-CM | POA: Diagnosis present

## 2015-03-26 DIAGNOSIS — M779 Enthesopathy, unspecified: Secondary | ICD-10-CM | POA: Diagnosis not present

## 2015-03-26 HISTORY — DX: Acute sinusitis, unspecified: J01.90

## 2015-03-26 LAB — POCT RAPID STREP A (OFFICE): Rapid Strep A Screen: NEGATIVE

## 2015-03-26 MED ORDER — AMOXICILLIN-POT CLAVULANATE 875-125 MG PO TABS: 1.0000 | ORAL_TABLET | Freq: Two times a day (BID) | ORAL | Status: DC

## 2015-03-26 NOTE — Progress Notes (Signed)
Pre visit review using our clinic review tool, if applicable. No additional management support is needed unless otherwise documented below in the visit note. 

## 2015-03-26 NOTE — Progress Notes (Signed)
Subjective:    Patient ID: [name], male    DOB: 1946/07/09, 69 y.o.   MRN: [id number]  HPI  Pt in with 2 wks of nasal congestion, sniffling and sneezing. He thinks he got this end of may on airplane  flight. Kid behind him was sick. Rt side sinus pressure worse. Coughing up some mucous today.  Pt has taken otc antihistamine. No decongestant component type med.Also using flonase and saline spray.    Review of Systems  Constitutional: Positive for fatigue. Negative for fever and chills.  HENT: Positive for congestion, sinus pressure and sneezing.   Respiratory: Negative for cough, chest tightness, shortness of breath and wheezing.   Cardiovascular: Negative for chest pain and palpitations.  Gastrointestinal: Negative for abdominal pain.  Neurological: Negative for dizziness, facial asymmetry, weakness, light-headedness and headaches.  Hematological: Negative for adenopathy. Does not bruise/bleed easily.  Psychiatric/Behavioral: Negative for behavioral problems and confusion.    Past Medical History  Diagnosis Date  . CAD (coronary artery disease)     s/p PTCI in 2003 and 2004 to the LAD.   . Diabetes mellitus   . Hypertension   . Hyperlipidemia   . Hypothyroid   . Arthritis   . Depression   . Internal hemorrhoids   . Thrombocytopenia   . TIA (transient ischemic attack)     per patient's report.  He was never officially given this diagnosis  . Migraine headache   . Skin cancer, basal cell   . Thrombocytopenia, unspecified 05/08/2013    History   Social History  . Marital Status: Married    Spouse Name: N/A  . Number of Children: 0  . Years of Education: N/A   Occupational History  . retired     Optometrist   Social History Main Topics  . Smoking status: Former Smoker -- 5 years    Types: Cigarettes    Quit date: 10/10/1965  . Smokeless tobacco: Never Used  . Alcohol Use: No  . Drug Use: No  . Sexual Activity:    Partners: Female   Other Topics  Concern  . Not on file   Social History Narrative   Exercise-- treadmill, stationary bike       Past Surgical History  Procedure Laterality Date  . Hernia repair    . Tosillectomy    . Thyroidectomy, partial  07/2002    right thyroid  . Coronary artery disease status post placement      of drug-eluting stent in the LAD in 2003,eEF 65% then  . Right toe bone spur surgery      Family History  Problem Relation Age of Onset  . Mental illness Mother     alzheimers  . Heart disease Mother   . Heart disease Father     triple by pass  . Hypertension Brother     Prostate Cancer  . Hypertension Brother   . Hypertension Brother   . COPD Brother     Allergies  Allergen Reactions  . Codeine   . Crestor [Rosuvastatin Calcium]   . Hydrocodone-Acetaminophen   . Rosuvastatin     REACTION: REALLY BAD JOINT/MUSCLE PAIN    Current Outpatient Prescriptions on File Prior to Visit  Medication Sig Dispense Refill  . aspirin 81 MG tablet Take 81 mg by mouth daily.      Marland Kitchen b complex vitamins tablet Take 1 tablet by mouth daily.    . Biotin 1000 MCG tablet Take 1,000 mcg by mouth daily.     Marland Kitchen  celecoxib (CELEBREX) 200 MG capsule Take 200 mg by mouth daily as needed.     . Cinnamon 500 MG capsule Take 1,000 mg by mouth 2 (two) times daily.     . Coenzyme Q10 (COQ-10) 100 MG capsule Take 100 mg by mouth 2 (two) times daily.     . Diclofenac Sodium (SOLARAZE) 3 % GEL Place 1 application onto the skin 2 (two) times daily.    Marland Kitchen ezetimibe-simvastatin (VYTORIN) 10-20 MG per tablet Take 1 tablet by mouth at bedtime.      . Ginger, Zingiber officinalis, (GINGER ROOT) 550 MG CAPS Take 1 capsule by mouth daily.      Marland Kitchen levothyroxine (SYNTHROID, LEVOTHROID) 112 MCG tablet Take 112 mcg by mouth daily.      Marland Kitchen losartan-hydrochlorothiazide (HYZAAR) 100-12.5 MG per tablet Take 1 tablet by mouth daily. 30 tablet 11  . metFORMIN (GLUMETZA) 500 MG (MOD) 24 hr tablet Take 500 mg by mouth 2 (two) times daily with  a meal.     . metoprolol succinate (TOPROL-XL) 100 MG 24 hr tablet Take 1 tablet (100 mg total) by mouth daily. Take with or immediately following a meal. 90 tablet 3  . multivitamin (THERAGRAN) per tablet Take 1 tablet by mouth daily.      . nitroGLYCERIN (NITROSTAT) 0.4 MG SL tablet Place 0.4 mg under the tongue every 5 (five) minutes as needed.      . NON FORMULARY Take 1 capsule by mouth daily. ( Tumeric 500 mg )     . Omega-3 Fatty Acids (FISH OIL) 1200 MG CAPS Take 2 capsules by mouth 2 (two) times daily.      . Tamsulosin HCl (FLOMAX) 0.4 MG CAPS Take 0.4 mg by mouth daily.       No current facility-administered medications on file prior to visit.    BP 149/76 mmHg  Pulse 78  Temp(Src) 98.5 F (36.9 C) (Oral)  Ht 5\' 10"  (1.778 m)  Wt 170 lb 9.6 oz (77.384 kg)  BMI 24.48 kg/m2  SpO2 98%       Objective:   Physical Exam  General  Mental Status - Alert. General Appearance - Well groomed. Not in acute distress.  Skin Rashes- No Rashes.  HEENT Head- Normal. Ear Auditory Canal - Left- Normal. Right - Normal.Tympanic Membrane- Left- Normal. Right- Normal. Eye Sclera/Conjunctiva- Left- Normal. Right- Normal. Nose & Sinuses Nasal Mucosa- Left-  Boggy and Congested. Right-  Boggy and  Congested. Rt sided maxillary sinus pressure but no frontal sinus pressure. Mouth & Throat Lips: Upper Lip- Normal: no dryness, cracking, pallor, cyanosis, or vesicular eruption. Lower Lip-Normal: no dryness, cracking, pallor, cyanosis or vesicular eruption. Buccal Mucosa- Bilateral- No Aphthous ulcers. Oropharynx- No Discharge or Erythema. Tonsils: Characteristics- Bilateral- No Erythema or Congestion. Size/Enlargement- Bilateral- No enlargement. Discharge- bilateral-None.  Neck Neck- Supple. No Masses.   Chest and Lung Exam Auscultation: Breath Sounds:-Clear even and unlabored.  Cardiovascular Auscultation:Rythm- Regular, rate and rhythm. Murmurs & Other Heart Sounds:Ausculatation  of the heart reveal- No Murmurs.  Lymphatic Head & Neck General Head & Neck Lymphatics: Bilateral: Description- No Localized lymphadenopathy.  Lt elbow- good range of motion. No pain on palpation. But moderate protruding area in lateral epicondyle region. No warmth.  Elbow is not swollen.      Assessment & Plan:

## 2015-03-26 NOTE — Assessment & Plan Note (Signed)
Rx augmentin. Continue flonase.

## 2015-03-26 NOTE — Patient Instructions (Addendum)
Acute sinusitis Rx augmentin. Continue flonase.  Possible early bronchitis as well.  Sorethroat looks like related to pnd by exam.  Lt lateral epicondyle protrusion. No pain. Will put xray order in.(Came on over past couple of days. Mentioned at very end of exam)  Folllow up in 7-10 days or as needed

## 2015-03-30 ENCOUNTER — Telehealth: Payer: Self-pay | Admitting: Family Medicine

## 2015-03-30 NOTE — Telephone Encounter (Signed)
Patient calling back regarding this.  °

## 2015-03-30 NOTE — Telephone Encounter (Signed)
Caller name: charlene Relation to pt: wife Call back number: [phone] Pharmacy:  Reason for call:    Patient wife states that patient is not feeling better since last visit. He feels worse and is achy. She wants to know if this is normal. Also, she states that patient bp is a little high and is wanting to know if this could be because he is sick and on antibiotics. Saw Edward.

## 2015-03-31 ENCOUNTER — Ambulatory Visit (HOSPITAL_BASED_OUTPATIENT_CLINIC_OR_DEPARTMENT_OTHER)
Admission: RE | Admit: 2015-03-31 | Discharge: 2015-03-31 | Disposition: A | Discharge status: Home / Self Care | Payer: Medicare Other | Source: Ambulatory Visit | Attending: Family Medicine | Admitting: Family Medicine

## 2015-03-31 ENCOUNTER — Encounter: Payer: Self-pay | Admitting: Family Medicine

## 2015-03-31 ENCOUNTER — Ambulatory Visit (INDEPENDENT_AMBULATORY_CARE_PROVIDER_SITE_OTHER): Payer: Medicare Other | Admitting: Family Medicine

## 2015-03-31 VITALS — BP 150/72 | HR 86 | Temp 98.4°F | Ht 69.0 in | Wt 170.4 lb

## 2015-03-31 DIAGNOSIS — R0989 Other specified symptoms and signs involving the circulatory and respiratory systems: Secondary | ICD-10-CM | POA: Insufficient documentation

## 2015-03-31 DIAGNOSIS — J0100 Acute maxillary sinusitis, unspecified: Secondary | ICD-10-CM | POA: Diagnosis not present

## 2015-03-31 DIAGNOSIS — J189 Pneumonia, unspecified organism: Secondary | ICD-10-CM

## 2015-03-31 DIAGNOSIS — Z87891 Personal history of nicotine dependence: Secondary | ICD-10-CM | POA: Diagnosis not present

## 2015-03-31 DIAGNOSIS — R05 Cough: Secondary | ICD-10-CM

## 2015-03-31 DIAGNOSIS — I2510 Atherosclerotic heart disease of native coronary artery without angina pectoris: Secondary | ICD-10-CM

## 2015-03-31 DIAGNOSIS — R059 Cough, unspecified: Secondary | ICD-10-CM

## 2015-03-31 DIAGNOSIS — E034 Atrophy of thyroid (acquired): Secondary | ICD-10-CM | POA: Diagnosis not present

## 2015-03-31 LAB — CBC WITH DIFFERENTIAL/PLATELET
Basophils Absolute: 0 10*3/uL (ref 0.0–0.1)
Basophils Relative: 0 % (ref 0.0–3.0)
EOS PCT: 1.8 % (ref 0.0–5.0)
Eosinophils Absolute: 0.1 10*3/uL (ref 0.0–0.7)
HEMATOCRIT: 46.1 % (ref 39.0–52.0)
HEMOGLOBIN: 15.8 g/dL (ref 13.0–17.0)
LYMPHS ABS: 0.8 10*3/uL (ref 0.7–4.0)
Lymphocytes Relative: 10.9 % — ABNORMAL LOW (ref 12.0–46.0)
MCHC: 34.2 g/dL (ref 30.0–36.0)
MCV: 93.7 fl (ref 78.0–100.0)
MONO ABS: 0.6 10*3/uL (ref 0.1–1.0)
Monocytes Relative: 7.4 % (ref 3.0–12.0)
NEUTROS ABS: 6.1 10*3/uL (ref 1.4–7.7)
Neutrophils Relative %: 79.9 % — ABNORMAL HIGH (ref 43.0–77.0)
Platelets: 152 10*3/uL (ref 150.0–400.0)
RBC: 4.92 Mil/uL (ref 4.22–5.81)
RDW: 13.1 % (ref 11.5–15.5)
WBC: 7.7 10*3/uL (ref 4.0–10.5)

## 2015-03-31 MED ORDER — PROMETHAZINE-DM 6.25-15 MG/5ML PO SYRP: 5.0000 mL | ORAL_SOLUTION | Freq: Four times a day (QID) | ORAL | Status: DC | PRN

## 2015-03-31 MED ORDER — LEVOFLOXACIN 500 MG PO TABS: 500.0000 mg | ORAL_TABLET | Freq: Every day | ORAL | Status: DC

## 2015-03-31 MED ORDER — ALBUTEROL SULFATE (2.5 MG/3ML) 0.083% IN NEBU
2.5000 mg | INHALATION_SOLUTION | Freq: Once | RESPIRATORY_TRACT | Status: AC
  Administered 2015-03-31: 2.5 mg via RESPIRATORY_TRACT

## 2015-03-31 MED ORDER — CEFTRIAXONE SODIUM 1 G IJ SOLR
2.0000 g | Freq: Once | INTRAMUSCULAR | Status: AC
  Administered 2015-03-31: 2 g via INTRAMUSCULAR

## 2015-03-31 NOTE — Progress Notes (Signed)
Pre visit review using our clinic review tool, if applicable. No additional management support is needed unless otherwise documented below in the visit note. 

## 2015-03-31 NOTE — Telephone Encounter (Signed)
Wife called stating pt is feeling worse and is very weak. No call back yesterday. Transferred to [facility].

## 2015-03-31 NOTE — Telephone Encounter (Signed)
Called wife.Patient placed on schedule today to see Dr. Etter Sjogren.

## 2015-03-31 NOTE — Progress Notes (Signed)
Patient ID: [name], male    DOB: 1946/05/12  Age: 69 y.o. MRN: [id number]    Subjective:  Subjective HPI [name] presents for f/u sinusitis.  Pt feels no better and now is coughing and it is keeping him awake.  No fevers.  No chest pain.    Review of Systems  Constitutional: Positive for chills. Negative for fever.  HENT: Positive for congestion, postnasal drip, rhinorrhea and sinus pressure.   Respiratory: Positive for cough, chest tightness, shortness of breath and wheezing.   Cardiovascular: Negative for chest pain, palpitations and leg swelling.  Allergic/Immunologic: Negative for environmental allergies.  Psychiatric/Behavioral: Negative for dysphoric mood and decreased concentration. The patient is not nervous/anxious.     History Past Medical History  Diagnosis Date  . CAD (coronary artery disease)     s/p PTCI in 2003 and 2004 to the LAD.   . Diabetes mellitus   . Hypertension   . Hyperlipidemia   . Hypothyroid   . Arthritis   . Depression   . Internal hemorrhoids   . Thrombocytopenia   . TIA (transient ischemic attack)     per patient's report.  He was never officially given this diagnosis  . Migraine headache   . Skin cancer, basal cell   . Thrombocytopenia, unspecified 05/08/2013    He has past surgical history that includes Hernia repair; tosillectomy; Thyroidectomy, partial (07/2002); coronary artery disease status post placement; and right toe bone spur surgery.   His family history includes COPD in his brother; Heart disease in his father and mother; Hypertension in his brother, brother, and brother; Mental illness in his mother.He reports that he quit smoking about 49 years ago. His smoking use included Cigarettes. He quit after 5 years of use. He has never used smokeless tobacco. He reports that he does not drink alcohol or use illicit drugs.  Current Outpatient Prescriptions on File Prior to Visit  Medication Sig Dispense Refill  . aspirin 81 MG  tablet Take 81 mg by mouth daily.      Marland Kitchen b complex vitamins tablet Take 1 tablet by mouth daily.    . Biotin 1000 MCG tablet Take 1,000 mcg by mouth daily.     . celecoxib (CELEBREX) 200 MG capsule Take 200 mg by mouth daily as needed.     . Cinnamon 500 MG capsule Take 1,000 mg by mouth 2 (two) times daily.     . Coenzyme Q10 (COQ-10) 100 MG capsule Take 100 mg by mouth 2 (two) times daily.     . Diclofenac Sodium (SOLARAZE) 3 % GEL Place 1 application onto the skin 2 (two) times daily.    Marland Kitchen ezetimibe-simvastatin (VYTORIN) 10-20 MG per tablet Take 1 tablet by mouth at bedtime.      . Ginger, Zingiber officinalis, (GINGER ROOT) 550 MG CAPS Take 1 capsule by mouth daily.      Marland Kitchen levothyroxine (SYNTHROID, LEVOTHROID) 112 MCG tablet Take 112 mcg by mouth daily.      Marland Kitchen losartan-hydrochlorothiazide (HYZAAR) 100-12.5 MG per tablet Take 1 tablet by mouth daily. 30 tablet 11  . metFORMIN (GLUMETZA) 500 MG (MOD) 24 hr tablet Take 500 mg by mouth 2 (two) times daily with a meal.     . metoprolol succinate (TOPROL-XL) 100 MG 24 hr tablet Take 1 tablet (100 mg total) by mouth daily. Take with or immediately following a meal. 90 tablet 3  . multivitamin (THERAGRAN) per tablet Take 1 tablet by mouth daily.      Marland Kitchen  nitroGLYCERIN (NITROSTAT) 0.4 MG SL tablet Place 0.4 mg under the tongue every 5 (five) minutes as needed.      . NON FORMULARY Take 1 capsule by mouth daily. ( Tumeric 500 mg )     . Omega-3 Fatty Acids (FISH OIL) 1200 MG CAPS Take 2 capsules by mouth 2 (two) times daily.      . Tamsulosin HCl (FLOMAX) 0.4 MG CAPS Take 0.4 mg by mouth daily.       No current facility-administered medications on file prior to visit.     Objective:  Objective Physical Exam  Constitutional: He is oriented to person, place, and time. He appears well-developed and well-nourished.  HENT:  Right Ear: Tympanic membrane, external ear and ear canal normal.  Left Ear: Tympanic membrane, external ear and ear canal normal.   Nose: Sinus tenderness present. Right sinus exhibits maxillary sinus tenderness and frontal sinus tenderness. Left sinus exhibits maxillary sinus tenderness and frontal sinus tenderness.  Mouth/Throat: Posterior oropharyngeal erythema present. No oropharyngeal exudate or posterior oropharyngeal edema.  + PND + errythema  Eyes: Conjunctivae are normal. Right eye exhibits no discharge. Left eye exhibits no discharge.  Neck: Normal range of motion. Neck supple.  Cardiovascular: Normal rate, regular rhythm and normal heart sounds.   No murmur heard. Pulmonary/Chest: Effort normal. No respiratory distress. He has wheezes. He has rales. He exhibits no tenderness.  Musculoskeletal: He exhibits no edema.  Lymphadenopathy:    He has cervical adenopathy.  Neurological: He is alert and oriented to person, place, and time.   BP 150/72 mmHg  Pulse 86  Temp(Src) 98.4 F (36.9 C) (Oral)  Ht 5\' 9"  (1.753 m)  Wt 170 lb 6.4 oz (77.293 kg)  BMI 25.15 kg/m2  SpO2 98% Wt Readings from Last 3 Encounters:  03/31/15 170 lb 6.4 oz (77.293 kg)  03/26/15 170 lb 9.6 oz (77.384 kg)  03/16/15 173 lb (78.472 kg)     Lab Results  Component Value Date   WBC 5.5 09/09/2014   HGB 17.5* 09/09/2014   HCT 52.0 09/09/2014   PLT 169.0 09/09/2014   GLUCOSE 79 03/16/2015   CHOL 121 03/16/2015   TRIG 81.0 03/16/2015   HDL 55.00 03/16/2015   LDLCALC 50 03/16/2015   ALT 30 03/16/2015   AST 25 03/16/2015   NA 138 03/16/2015   K 4.0 03/16/2015   CL 107 03/16/2015   CREATININE 1.07 03/16/2015   BUN 18 03/16/2015   CO2 28 03/16/2015   TSH 1.182 11/23/2011   PSA 2.64 09/09/2014   INR 1.0 RATIO 12/11/2008   HGBA1C 5.3 09/09/2014   MICROALBUR 2.3* 09/09/2014    Dg Elbow Complete Left  03/27/2015   CLINICAL DATA:  Epicondylitis.  EXAM: LEFT ELBOW - COMPLETE 3+ VIEW  COMPARISON:  None.  FINDINGS: Diffuse degenerative change present. No evidence acute fracture or dislocation. Deformity of the radial head is most  likely degenerative. No evidence of effusion.  IMPRESSION: Diffuse degenerative changes left elbow.  No acute bony abnormality.   Electronically Signed   By: Marcello Moores  Register   On: 03/27/2015 08:48     Assessment & Plan:  Plan I have discontinued Mr. Marsalis amoxicillin-clavulanate. I am also having him start on levofloxacin and promethazine-dextromethorphan. Additionally, I am having him maintain his aspirin, Biotin, multivitamin, Fish Oil, CoQ-10, Cinnamon, Ginger Root, NON FORMULARY, ezetimibe-simvastatin, levothyroxine, tamsulosin, celecoxib, metFORMIN, nitroGLYCERIN, Diclofenac Sodium, b complex vitamins, losartan-hydrochlorothiazide, and metoprolol succinate. We administered cefTRIAXone and albuterol.  Meds ordered this encounter  Medications  .  levofloxacin (LEVAQUIN) 500 MG tablet    Sig: Take 1 tablet (500 mg total) by mouth daily.    Dispense:  7 tablet    Refill:  0  . promethazine-dextromethorphan (PROMETHAZINE-DM) 6.25-15 MG/5ML syrup    Sig: Take 5 mLs by mouth 4 (four) times daily as needed for cough.    Dispense:  118 mL    Refill:  0  . cefTRIAXone (ROCEPHIN) injection 2 g    Sig:     Order Specific Question:  Antibiotic Indication:    Answer:  CAP  . albuterol (PROVENTIL) (2.5 MG/3ML) 0.083% nebulizer solution 2.5 mg    Sig:     Problem List Items Addressed This Visit    None    Visit Diagnoses    Cough    -  Primary    Relevant Orders    DG Chest 2 View    CAP (community acquired pneumonia)        Relevant Medications    levofloxacin (LEVAQUIN) 500 MG tablet    promethazine-dextromethorphan (PROMETHAZINE-DM) 6.25-15 MG/5ML syrup    cefTRIAXone (ROCEPHIN) injection 2 g (Completed) (Start on 03/31/2015  2:00 PM)    albuterol (PROVENTIL) (2.5 MG/3ML) 0.083% nebulizer solution 2.5 mg (Completed) (Start on 03/31/2015  2:00 PM)    Other Relevant Orders    CBC with Differential/Platelet    Acute maxillary sinusitis, recurrence not specified        Relevant  Medications    levofloxacin (LEVAQUIN) 500 MG tablet    promethazine-dextromethorphan (PROMETHAZINE-DM) 6.25-15 MG/5ML syrup    cefTRIAXone (ROCEPHIN) injection 2 g (Completed) (Start on 03/31/2015  2:00 PM)    Other Relevant Orders    CBC with Differential/Platelet       Follow-up: Return in about 2 weeks (around 04/14/2015), or if symptoms worsen or fail to improve, for f/u pneumonia.  Garnet Koyanagi, DO

## 2015-04-02 ENCOUNTER — Encounter: Payer: Self-pay | Admitting: Family Medicine

## 2015-04-06 ENCOUNTER — Other Ambulatory Visit: Payer: Self-pay

## 2015-04-15 DIAGNOSIS — E789 Disorder of lipoprotein metabolism, unspecified: Secondary | ICD-10-CM | POA: Diagnosis not present

## 2015-04-15 DIAGNOSIS — E118 Type 2 diabetes mellitus with unspecified complications: Secondary | ICD-10-CM | POA: Diagnosis not present

## 2015-04-15 DIAGNOSIS — I10 Essential (primary) hypertension: Secondary | ICD-10-CM | POA: Diagnosis not present

## 2015-04-15 DIAGNOSIS — J189 Pneumonia, unspecified organism: Secondary | ICD-10-CM | POA: Diagnosis not present

## 2015-04-20 ENCOUNTER — Ambulatory Visit (INDEPENDENT_AMBULATORY_CARE_PROVIDER_SITE_OTHER): Payer: Medicare Other | Admitting: Family Medicine

## 2015-04-20 ENCOUNTER — Encounter: Payer: Self-pay | Admitting: Family Medicine

## 2015-04-20 ENCOUNTER — Telehealth: Payer: Self-pay | Admitting: Family Medicine

## 2015-04-20 VITALS — BP 112/74 | HR 72 | Temp 97.8°F | Wt 166.4 lb

## 2015-04-20 DIAGNOSIS — H6503 Acute serous otitis media, bilateral: Secondary | ICD-10-CM | POA: Diagnosis not present

## 2015-04-20 DIAGNOSIS — I2510 Atherosclerotic heart disease of native coronary artery without angina pectoris: Secondary | ICD-10-CM

## 2015-04-20 MED ORDER — PREDNISONE 10 MG PO TABS: ORAL_TABLET | ORAL | Status: DC

## 2015-04-20 NOTE — Patient Instructions (Signed)
Sliding scale reg insulin--- check glucose 4x a day---- fasting and 2 hr after each meal  200-250  2 units 251-300  4 units 301-350  6 u  351-400  8 u  >400       10 u and call dr

## 2015-04-20 NOTE — Telephone Encounter (Signed)
Caller name: [name] Relationship to patient: wife Can be reached: call pt back at [phone] (cell)  Reason for call: Pt had Endo appt 7/6 and was told to take Zyrtec for ear that is still draining and cannot hear out of right ear. Endo said if no improvement in 5 days he could order prednisone pack. Wife is wondering if that should be the next step. They called ear doctor and cannot get in until 7/19. Ear doctor said it sounds like ears need cleaned and is not an emergency. Please call pt back.

## 2015-04-20 NOTE — Progress Notes (Signed)
Pre visit review using our clinic review tool, if applicable. No additional management support is needed unless otherwise documented below in the visit note. 

## 2015-04-20 NOTE — Telephone Encounter (Signed)
The patient would need an appointment in order for [place] to determine the problems. Please schedule him with someone.    KP

## 2015-04-20 NOTE — Telephone Encounter (Signed)
Scheduled for appt 04/20/15 2:45pm.

## 2015-04-20 NOTE — Progress Notes (Signed)
Subjective:    Patient ID: [name], male    DOB: July 23, 1946, 69 y.o.   MRN: [id number]  HPI  Patient here for f/u ears.  --- pt states they still feel plugged - he never got any relief from medication given previously and his endo told him to take zyrtec d ----which has not helped.    Past Medical History  Diagnosis Date  . CAD (coronary artery disease)     s/p PTCI in 2003 and 2004 to the LAD.   . Diabetes mellitus   . Hypertension   . Hyperlipidemia   . Hypothyroid   . Arthritis   . Depression   . Internal hemorrhoids   . Thrombocytopenia   . TIA (transient ischemic attack)     per patient's report.  He was never officially given this diagnosis  . Migraine headache   . Skin cancer, basal cell   . Thrombocytopenia, unspecified 05/08/2013    Review of Systems  Constitutional: Negative for diaphoresis, appetite change, fatigue and unexpected weight change.  HENT: Positive for ear pain and hearing loss.        + ear pressure b/l  Pt feels like he needs to pop his ears  Eyes: Negative for pain, redness and visual disturbance.  Respiratory: Negative for cough, chest tightness, shortness of breath and wheezing.   Cardiovascular: Negative for chest pain, palpitations and leg swelling.  Endocrine: Negative for cold intolerance, heat intolerance, polydipsia, polyphagia and polyuria.  Genitourinary: Negative for dysuria, frequency and difficulty urinating.  Neurological: Negative for dizziness, light-headedness, numbness and headaches.    Current Outpatient Prescriptions on File Prior to Visit  Medication Sig Dispense Refill  . aspirin 81 MG tablet Take 81 mg by mouth daily.      Marland Kitchen b complex vitamins tablet Take 1 tablet by mouth daily.    . Biotin 1000 MCG tablet Take 1,000 mcg by mouth daily.     . celecoxib (CELEBREX) 200 MG capsule Take 200 mg by mouth daily as needed.     . Cinnamon 500 MG capsule Take 1,000 mg by mouth 2 (two) times daily.     . Coenzyme Q10 (COQ-10)  100 MG capsule Take 100 mg by mouth 2 (two) times daily.     . Diclofenac Sodium (SOLARAZE) 3 % GEL Place 1 application onto the skin 2 (two) times daily.    Marland Kitchen ezetimibe-simvastatin (VYTORIN) 10-20 MG per tablet Take 1 tablet by mouth at bedtime.      . Ginger, Zingiber officinalis, (GINGER ROOT) 550 MG CAPS Take 1 capsule by mouth daily.      Marland Kitchen levothyroxine (SYNTHROID, LEVOTHROID) 112 MCG tablet Take 112 mcg by mouth daily.      Marland Kitchen losartan-hydrochlorothiazide (HYZAAR) 100-12.5 MG per tablet Take 1 tablet by mouth daily. 30 tablet 11  . metFORMIN (GLUMETZA) 500 MG (MOD) 24 hr tablet Take 500 mg by mouth 2 (two) times daily with a meal.     . metoprolol succinate (TOPROL-XL) 100 MG 24 hr tablet Take 1 tablet (100 mg total) by mouth daily. Take with or immediately following a meal. 90 tablet 3  . multivitamin (THERAGRAN) per tablet Take 1 tablet by mouth daily.      . nitroGLYCERIN (NITROSTAT) 0.4 MG SL tablet Place 0.4 mg under the tongue every 5 (five) minutes as needed.      . NON FORMULARY Take 1 capsule by mouth daily. ( Tumeric 500 mg )     . Omega-3 Fatty  Acids (FISH OIL) 1200 MG CAPS Take 2 capsules by mouth 2 (two) times daily.      . Tamsulosin HCl (FLOMAX) 0.4 MG CAPS Take 0.4 mg by mouth daily.       No current facility-administered medications on file prior to visit.       Objective:    Physical Exam  Constitutional: He is oriented to person, place, and time. Vital signs are normal. He appears well-developed and well-nourished. He is sleeping.  HENT:  Head: Normocephalic and atraumatic.  Right Ear: Tympanic membrane is bulging. Tympanic membrane mobility is abnormal. A middle ear effusion is present. Decreased hearing is noted.  Left Ear: Tympanic membrane is bulging. Tympanic membrane mobility is abnormal. A middle ear effusion is present. Decreased hearing is noted.  Mouth/Throat: Oropharynx is clear and moist.  Eyes: EOM are normal. Pupils are equal, round, and reactive to  light.  Neck: Normal range of motion. Neck supple. No thyromegaly present.  Cardiovascular: Normal rate and regular rhythm.   No murmur heard. Pulmonary/Chest: Effort normal and breath sounds normal. No respiratory distress. He has no wheezes. He has no rales. He exhibits no tenderness.  Musculoskeletal: He exhibits no edema or tenderness.  Neurological: He is alert and oriented to person, place, and time.  Skin: Skin is warm and dry.  Psychiatric: He has a normal mood and affect. His behavior is normal. Judgment and thought content normal.    BP 112/74 mmHg  Pulse 72  Temp(Src) 97.8 F (36.6 C) (Oral)  Wt 166 lb 6.4 oz (75.479 kg)  SpO2 97% Wt Readings from Last 3 Encounters:  04/20/15 166 lb 6.4 oz (75.479 kg)  03/31/15 170 lb 6.4 oz (77.293 kg)  03/26/15 170 lb 9.6 oz (77.384 kg)     Lab Results  Component Value Date   WBC 7.7 03/31/2015   HGB 15.8 03/31/2015   HCT 46.1 03/31/2015   PLT 152.0 03/31/2015   GLUCOSE 79 03/16/2015   CHOL 121 03/16/2015   TRIG 81.0 03/16/2015   HDL 55.00 03/16/2015   LDLCALC 50 03/16/2015   ALT 30 03/16/2015   AST 25 03/16/2015   NA 138 03/16/2015   K 4.0 03/16/2015   CL 107 03/16/2015   CREATININE 1.07 03/16/2015   BUN 18 03/16/2015   CO2 28 03/16/2015   TSH 1.182 11/23/2011   PSA 2.64 09/09/2014   INR 1.0 RATIO 12/11/2008   HGBA1C 5.3 09/09/2014   MICROALBUR 2.3* 09/09/2014       Assessment & Plan:   Problem List Items Addressed This Visit    None    Visit Diagnoses    Bilateral acute serous otitis media, recurrence not specified    -  Primary    Relevant Medications    predniSONE (DELTASONE) 10 MG tablet       I have discontinued [name] levofloxacin and promethazine-dextromethorphan. I am also having him start on predniSONE. Additionally, I am having him maintain his aspirin, Biotin, multivitamin, Fish Oil, CoQ-10, Cinnamon, Ginger Root, NON FORMULARY, ezetimibe-simvastatin, levothyroxine, tamsulosin, celecoxib,  metFORMIN, nitroGLYCERIN, Diclofenac Sodium, b complex vitamins, losartan-hydrochlorothiazide, and metoprolol succinate.  Meds ordered this encounter  Medications  . predniSONE (DELTASONE) 10 MG tablet    Sig: 3 po qd for 3 days then 2 po qd for 3 days the 1 po qd for 3 days    Dispense:  18 tablet    Refill:  0  pt also given SS insulin in case his sugars spike-- see AVS con't flonase  and if there is no relief Refer to ENT   Garnet Koyanagi, DO

## 2015-04-28 DIAGNOSIS — H6983 Other specified disorders of Eustachian tube, bilateral: Secondary | ICD-10-CM | POA: Diagnosis not present

## 2015-04-28 DIAGNOSIS — H6503 Acute serous otitis media, bilateral: Secondary | ICD-10-CM | POA: Diagnosis not present

## 2015-04-28 DIAGNOSIS — Z974 Presence of external hearing-aid: Secondary | ICD-10-CM | POA: Diagnosis not present

## 2015-04-28 DIAGNOSIS — H903 Sensorineural hearing loss, bilateral: Secondary | ICD-10-CM | POA: Diagnosis not present

## 2015-04-30 DIAGNOSIS — H6983 Other specified disorders of Eustachian tube, bilateral: Secondary | ICD-10-CM | POA: Diagnosis not present

## 2015-06-01 ENCOUNTER — Encounter: Payer: Self-pay | Admitting: Gastroenterology

## 2015-06-05 ENCOUNTER — Encounter: Payer: Self-pay | Admitting: Gastroenterology

## 2015-06-05 DIAGNOSIS — H903 Sensorineural hearing loss, bilateral: Secondary | ICD-10-CM | POA: Diagnosis not present

## 2015-06-05 DIAGNOSIS — H938X2 Other specified disorders of left ear: Secondary | ICD-10-CM | POA: Diagnosis not present

## 2015-06-05 DIAGNOSIS — H6983 Other specified disorders of Eustachian tube, bilateral: Secondary | ICD-10-CM | POA: Diagnosis not present

## 2015-06-05 DIAGNOSIS — H9211 Otorrhea, right ear: Secondary | ICD-10-CM | POA: Diagnosis not present

## 2015-06-06 ENCOUNTER — Encounter: Payer: Self-pay | Admitting: Gastroenterology

## 2015-06-23 ENCOUNTER — Encounter: Payer: Self-pay | Admitting: Gastroenterology

## 2015-07-20 DIAGNOSIS — L570 Actinic keratosis: Secondary | ICD-10-CM | POA: Diagnosis not present

## 2015-08-03 ENCOUNTER — Ambulatory Visit (AMBULATORY_SURGERY_CENTER): Payer: Self-pay | Admitting: *Deleted

## 2015-08-03 VITALS — Ht 69.0 in | Wt 174.4 lb

## 2015-08-03 DIAGNOSIS — Z86010 Personal history of colonic polyps: Secondary | ICD-10-CM

## 2015-08-03 NOTE — Progress Notes (Signed)
No egg or soy allergy No issues with past sedation No diet pills No home 02 use emmi video to e mail  

## 2015-08-13 DIAGNOSIS — E789 Disorder of lipoprotein metabolism, unspecified: Secondary | ICD-10-CM | POA: Diagnosis not present

## 2015-08-13 DIAGNOSIS — E034 Atrophy of thyroid (acquired): Secondary | ICD-10-CM | POA: Diagnosis not present

## 2015-08-13 DIAGNOSIS — E118 Type 2 diabetes mellitus with unspecified complications: Secondary | ICD-10-CM | POA: Diagnosis not present

## 2015-08-17 ENCOUNTER — Encounter: Payer: Self-pay | Admitting: Gastroenterology

## 2015-08-17 ENCOUNTER — Ambulatory Visit (AMBULATORY_SURGERY_CENTER): Payer: Medicare Other | Admitting: Gastroenterology

## 2015-08-17 VITALS — BP 115/75 | HR 58 | Temp 98.1°F | Resp 18 | Ht 69.0 in | Wt 174.0 lb

## 2015-08-17 DIAGNOSIS — Z86010 Personal history of colonic polyps: Secondary | ICD-10-CM | POA: Diagnosis present

## 2015-08-17 DIAGNOSIS — I2510 Atherosclerotic heart disease of native coronary artery without angina pectoris: Secondary | ICD-10-CM | POA: Diagnosis not present

## 2015-08-17 DIAGNOSIS — Z8673 Personal history of transient ischemic attack (TIA), and cerebral infarction without residual deficits: Secondary | ICD-10-CM | POA: Diagnosis not present

## 2015-08-17 DIAGNOSIS — K635 Polyp of colon: Secondary | ICD-10-CM

## 2015-08-17 DIAGNOSIS — I10 Essential (primary) hypertension: Secondary | ICD-10-CM | POA: Diagnosis not present

## 2015-08-17 DIAGNOSIS — N400 Enlarged prostate without lower urinary tract symptoms: Secondary | ICD-10-CM | POA: Diagnosis not present

## 2015-08-17 DIAGNOSIS — D123 Benign neoplasm of transverse colon: Secondary | ICD-10-CM

## 2015-08-17 DIAGNOSIS — E119 Type 2 diabetes mellitus without complications: Secondary | ICD-10-CM | POA: Diagnosis not present

## 2015-08-17 DIAGNOSIS — D125 Benign neoplasm of sigmoid colon: Secondary | ICD-10-CM

## 2015-08-17 DIAGNOSIS — K5140 Inflammatory polyps of colon without complications: Secondary | ICD-10-CM | POA: Diagnosis not present

## 2015-08-17 DIAGNOSIS — E039 Hypothyroidism, unspecified: Secondary | ICD-10-CM | POA: Diagnosis not present

## 2015-08-17 LAB — GLUCOSE, CAPILLARY
GLUCOSE-CAPILLARY: 113 mg/dL — AB (ref 65–99)
GLUCOSE-CAPILLARY: 68 mg/dL (ref 65–99)
GLUCOSE-CAPILLARY: 137 mg/dL — AB (ref 65–99)

## 2015-08-17 MED ORDER — DEXTROSE 5 % IV SOLN: INTRAVENOUS | Status: DC

## 2015-08-17 NOTE — Progress Notes (Addendum)
PT. CBG=68, Pt denies hypoglycemia D5W Up. Pt. Stated blood sugar runs about 100. 1102 cbg rechecked =113.

## 2015-08-17 NOTE — Progress Notes (Signed)
Called to room to assist during endoscopic procedure.  Patient ID and intended procedure confirmed with present staff. Received instructions for my participation in the procedure from the performing physician.  

## 2015-08-17 NOTE — Op Note (Signed)
[place]  [address]. [place], [address]   COLONOSCOPY PROCEDURE REPORT  PATIENT: [name], [name]  MR#: [id number] BIRTHDATE: 05/31/1946 , 69  yrs. old GENDER: male ENDOSCOPIST: Ladene Artist, MD, [facility] PROCEDURE DATE:  08/17/2015 PROCEDURE:   Colonoscopy, surveillance and Colonoscopy with snare polypectomy First Screening Colonoscopy - Avg.  risk and is 50 yrs.  old or older - No.  Prior Negative Screening - Now for repeat screening. N/A  History of Adenoma - Now for follow-up colonoscopy & has been > or = to 3 yrs.  Yes hx of adenoma.  Has been 3 or more years since last colonoscopy.  Polyps removed today? Yes ASA CLASS:   Class III INDICATIONS:Screening for colonic neoplasia and PH Colon Adenoma. MEDICATIONS: Monitored anesthesia care and Propofol 200 mg IV DESCRIPTION OF PROCEDURE:   After the risks benefits and alternatives of the procedure were thoroughly explained, informed consent was obtained.  The digital rectal exam revealed no abnormalities of the rectum.   The LB [id number]  endoscope was introduced through the anus and advanced to the cecum, which was identified by both the appendix and ileocecal valve. No adverse events experienced.   The quality of the prep was adequate (MiraLax was used)  The instrument was then slowly withdrawn as the colon was fully examined. Estimated blood loss is zero unless otherwise noted in this procedure report.    COLON FINDINGS: Two sessile polyps measuring 5-6 mm in size were found in the sigmoid colon and transverse colon.  Polypectomies were performed with a cold snare.  The resection was complete, the polyp tissue was completely retrieved and sent to histology. There was mild diverticulosis noted in the sigmoid colon.   The examination was otherwise normal.  Retroflexed views revealed no abnormalities. The time to cecum = 2.5 Withdrawal time = 10.9   The scope was withdrawn and the procedure  completed. COMPLICATIONS: There were no immediate complications.  ENDOSCOPIC IMPRESSION: 1.   Two sessile polyps in the sigmoid colon and transverse colon; polypectomies performed with a cold snare 2.   Mild diverticulosis in the sigmoid colon  RECOMMENDATIONS: 1.  Await pathology results 2.  High fiber diet with liberal fluid intake. 3.  Repeat Colonoscopy in 5 years.  eSigned:  Ladene Artist, MD, [facility] 08/17/2015 11:28 AM

## 2015-08-17 NOTE — Progress Notes (Signed)
Report to PACU, RN, vss, BBS= Clear.  

## 2015-08-17 NOTE — Patient Instructions (Signed)
YOU HAD AN ENDOSCOPIC PROCEDURE TODAY AT THE [facility]:   Refer to the procedure report that was given to you for any specific questions about what was found during the examination.  If the procedure report does not answer your questions, please call your gastroenterologist to clarify.  If you requested that your care partner not be given the details of your procedure findings, then the procedure report has been included in a sealed envelope for you to review at your convenience later.  YOU SHOULD EXPECT: Some feelings of bloating in the abdomen. Passage of more gas than usual.  Walking can help get rid of the air that was put into your GI tract during the procedure and reduce the bloating. If you had a lower endoscopy (such as a colonoscopy or flexible sigmoidoscopy) you may notice spotting of blood in your stool or on the toilet paper. If you underwent a bowel prep for your procedure, you may not have a normal bowel movement for a few days.  Please Note:  You might notice some irritation and congestion in your nose or some drainage.  This is from the oxygen used during your procedure.  There is no need for concern and it should clear up in a day or so.  SYMPTOMS TO REPORT IMMEDIATELY:   Following lower endoscopy (colonoscopy or flexible sigmoidoscopy):  Excessive amounts of blood in the stool  Significant tenderness or worsening of abdominal pains  Swelling of the abdomen that is new, acute  Fever of 100F or higher   For urgent or emergent issues, a gastroenterologist can be reached at any hour by calling [phone].   DIET: Your first meal following the procedure should be a small meal and then it is ok to progress to your normal diet. Heavy or fried foods are harder to digest and may make you feel nauseous or bloated.  Likewise, meals heavy in dairy and vegetables can increase bloating.  Drink plenty of fluids but you should avoid alcoholic beverages for 24  hours.  ACTIVITY:  You should plan to take it easy for the rest of today and you should NOT DRIVE or use heavy machinery until tomorrow (because of the sedation medicines used during the test).    FOLLOW UP: Our staff will call the number listed on your records the next business day following your procedure to check on you and address any questions or concerns that you may have regarding the information given to you following your procedure. If we do not reach you, we will leave a message.  However, if you are feeling well and you are not experiencing any problems, there is no need to return our call.  We will assume that you have returned to your regular daily activities without incident.  If any biopsies were taken you will be contacted by phone or by letter within the next 1-3 weeks.  Please call us at [phone] if you have not heard about the biopsies in 3 weeks.    SIGNATURES/CONFIDENTIALITY: You and/or your care partner have signed paperwork which will be entered into your electronic medical record.  These signatures attest to the fact that that the information above on your After Visit Summary has been reviewed and is understood.  Full responsibility of the confidentiality of this discharge information lies with you and/or your care-partner. 

## 2015-08-18 ENCOUNTER — Telehealth: Payer: Self-pay | Admitting: *Deleted

## 2015-08-18 NOTE — Telephone Encounter (Signed)
  Follow up Call-  Call back number 08/17/2015  Post procedure Call Back phone  # [phone]  Permission to leave phone message Yes     Patient questions:  Do you have a fever, pain , or abdominal swelling? No. Pain Score  0 *  Have you tolerated food without any problems? Yes.    Have you been able to return to your normal activities? Yes.    Do you have any questions about your discharge instructions: Diet   No. Medications  No. Follow up visit  No.  Do you have questions or concerns about your Care? No.  Actions: * If pain score is 4 or above: No action needed, pain <4.

## 2015-08-19 DIAGNOSIS — I2510 Atherosclerotic heart disease of native coronary artery without angina pectoris: Secondary | ICD-10-CM | POA: Diagnosis not present

## 2015-08-19 DIAGNOSIS — I10 Essential (primary) hypertension: Secondary | ICD-10-CM | POA: Diagnosis not present

## 2015-08-19 DIAGNOSIS — E789 Disorder of lipoprotein metabolism, unspecified: Secondary | ICD-10-CM | POA: Diagnosis not present

## 2015-08-19 DIAGNOSIS — Z23 Encounter for immunization: Secondary | ICD-10-CM | POA: Diagnosis not present

## 2015-08-20 ENCOUNTER — Encounter: Payer: Self-pay | Admitting: Gastroenterology

## 2015-09-09 ENCOUNTER — Encounter: Payer: Self-pay | Admitting: Cardiology

## 2015-09-15 ENCOUNTER — Encounter: Payer: Medicare Other | Admitting: Family Medicine

## 2015-09-23 ENCOUNTER — Telehealth: Payer: Self-pay | Admitting: Behavioral Health

## 2015-09-23 ENCOUNTER — Encounter: Payer: Self-pay | Admitting: Behavioral Health

## 2015-09-23 NOTE — Telephone Encounter (Signed)
Pre-Visit Call completed with patient and chart updated.   Pre-Visit Info documented in Specialty Comments under [employer].    

## 2015-09-24 ENCOUNTER — Encounter: Payer: Medicare Other | Admitting: Family Medicine

## 2015-09-24 ENCOUNTER — Ambulatory Visit (INDEPENDENT_AMBULATORY_CARE_PROVIDER_SITE_OTHER): Payer: Medicare Other | Admitting: Family Medicine

## 2015-09-24 ENCOUNTER — Encounter: Payer: Self-pay | Admitting: Family Medicine

## 2015-09-24 VITALS — BP 127/75 | HR 62 | Temp 97.5°F | Ht 69.0 in | Wt 166.8 lb

## 2015-09-24 DIAGNOSIS — Z1159 Encounter for screening for other viral diseases: Secondary | ICD-10-CM | POA: Diagnosis not present

## 2015-09-24 DIAGNOSIS — Z0000 Encounter for general adult medical examination without abnormal findings: Secondary | ICD-10-CM | POA: Diagnosis not present

## 2015-09-24 DIAGNOSIS — E039 Hypothyroidism, unspecified: Secondary | ICD-10-CM | POA: Diagnosis not present

## 2015-09-24 DIAGNOSIS — E1310 Other specified diabetes mellitus with ketoacidosis without coma: Secondary | ICD-10-CM | POA: Diagnosis not present

## 2015-09-24 DIAGNOSIS — I10 Essential (primary) hypertension: Secondary | ICD-10-CM

## 2015-09-24 DIAGNOSIS — N401 Enlarged prostate with lower urinary tract symptoms: Secondary | ICD-10-CM

## 2015-09-24 DIAGNOSIS — E1110 Type 2 diabetes mellitus with ketoacidosis without coma: Secondary | ICD-10-CM

## 2015-09-24 DIAGNOSIS — E785 Hyperlipidemia, unspecified: Secondary | ICD-10-CM

## 2015-09-24 DIAGNOSIS — R351 Nocturia: Secondary | ICD-10-CM | POA: Diagnosis not present

## 2015-09-24 LAB — COMPREHENSIVE METABOLIC PANEL
ALT: 22 U/L (ref 0–53)
AST: 22 U/L (ref 0–37)
Albumin: 4.2 g/dL (ref 3.5–5.2)
Alkaline Phosphatase: 55 U/L (ref 39–117)
BILIRUBIN TOTAL: 0.9 mg/dL (ref 0.2–1.2)
BUN: 22 mg/dL (ref 6–23)
CALCIUM: 9.8 mg/dL (ref 8.4–10.5)
CHLORIDE: 103 meq/L (ref 96–112)
CO2: 34 meq/L — AB (ref 19–32)
CREATININE: 1.23 mg/dL (ref 0.40–1.50)
GFR: 61.53 mL/min (ref >60.00)
GLUCOSE: 63 mg/dL — AB (ref 70–99)
Potassium: 4.1 mEq/L (ref 3.5–5.1)
Sodium: 140 mEq/L (ref 135–145)
Total Protein: 7.1 g/dL (ref 6.0–8.3)

## 2015-09-24 LAB — HEMOGLOBIN A1C: Hgb A1c MFr Bld: 5.1 % (ref 4.6–6.5)

## 2015-09-24 LAB — POCT URINALYSIS DIPSTICK
Bilirubin, UA: NEGATIVE
GLUCOSE UA: 100
Ketones, UA: NEGATIVE
Leukocytes, UA: NEGATIVE
NITRITE UA: NEGATIVE
PROTEIN UA: NEGATIVE
RBC UA: NEGATIVE
UROBILINOGEN UA: 0.2
pH, UA: 6

## 2015-09-24 LAB — CBC WITH DIFFERENTIAL/PLATELET
BASOS ABS: 0.1 10*3/uL (ref 0.0–0.1)
BASOS PCT: 0.8 % (ref 0.0–3.0)
Eosinophils Absolute: 0.3 10*3/uL (ref 0.0–0.7)
Eosinophils Relative: 4.1 % (ref 0.0–5.0)
HCT: 50.3 % (ref 39.0–52.0)
Hemoglobin: 17.2 g/dL — ABNORMAL HIGH (ref 13.0–17.0)
LYMPHS ABS: 1.3 10*3/uL (ref 0.7–4.0)
Lymphocytes Relative: 18.2 % (ref 12.0–46.0)
MCHC: 34.2 g/dL (ref 30.0–36.0)
MCV: 93.7 fl (ref 78.0–100.0)
MONO ABS: 0.8 10*3/uL (ref 0.1–1.0)
Monocytes Relative: 10.5 % (ref 3.0–12.0)
NEUTROS ABS: 4.9 10*3/uL (ref 1.4–7.7)
NEUTROS PCT: 66.4 % (ref 43.0–77.0)
PLATELETS: 159 10*3/uL (ref 150.0–400.0)
RBC: 5.37 Mil/uL (ref 4.22–5.81)
RDW: 12.5 % (ref 11.5–15.5)
WBC: 7.4 10*3/uL (ref 4.0–10.5)

## 2015-09-24 LAB — MICROALBUMIN / CREATININE URINE RATIO
CREATININE, U: 171.2 mg/dL
MICROALB/CREAT RATIO: 1.5 mg/g (ref 0.0–30.0)
Microalb, Ur: 2.5 mg/dL — ABNORMAL HIGH (ref 0.0–1.9)

## 2015-09-24 LAB — TSH: TSH: 2.12 u[IU]/mL (ref 0.35–4.50)

## 2015-09-24 LAB — HEPATITIS C ANTIBODY: HCV Ab: NEGATIVE

## 2015-09-24 LAB — LIPID PANEL
CHOL/HDL RATIO: 2
Cholesterol: 120 mg/dL (ref 0–200)
HDL: 57.2 mg/dL (ref >39.00)
LDL Cholesterol: 45 mg/dL (ref 0–99)
NonHDL: 62.48
TRIGLYCERIDES: 85 mg/dL (ref 0.0–149.0)
VLDL: 17 mg/dL (ref 0.0–40.0)

## 2015-09-24 LAB — PSA: PSA: 3.42 ng/mL (ref 0.10–4.00)

## 2015-09-24 NOTE — Progress Notes (Signed)
Subjective:    Patient ID: [name], male    DOB: Oct 21, 1945, 69 y.o.   MRN: [id number]  HPI    Review of Systems     Objective:   Physical Exam        Assessment & Plan:     Subjective:   [name] is a 69 y.o. male who presents for Medicare Annual/Subsequent preventive examination.  Review of Systems:   Review of Systems  Constitutional: Negative for activity change, appetite change and fatigue.  HENT: Negative for hearing loss, congestion, tinnitus and ear discharge.   Eyes: Negative for visual disturbance (see optho q1y -- vision corrected to 20/20 with glasses).  Respiratory: Negative for cough, chest tightness and shortness of breath.   Cardiovascular: Negative for chest pain, palpitations and leg swelling.  Gastrointestinal: Negative for abdominal pain, diarrhea, constipation and abdominal distention.  Genitourinary: Negative for urgency, frequency, decreased urine volume and difficulty urinating.  Musculoskeletal: Negative for back pain, arthralgias and gait problem.  Skin: Negative for color change, pallor and rash.  Neurological: Negative for dizziness, light-headedness, numbness and headaches.  Hematological: Negative for adenopathy. Does not bruise/bleed easily.  Psychiatric/Behavioral: Negative for suicidal ideas, confusion, sleep disturbance, self-injury, dysphoric mood, decreased concentration and agitation.  Pt is able to read and write and can do all ADLs No risk for falling No abuse/ violence in home          Objective:    Vitals: BP 127/75 mmHg  Pulse 62  Temp(Src) 97.5 F (36.4 C) (Oral)  Ht 5\' 9"  (1.753 m)  Wt 166 lb 12.8 oz (75.66 kg)  BMI 24.62 kg/m2  SpO2 97%  Tobacco History  Smoking status  . Former Smoker -- 5 years  . Types: Cigarettes  . Quit date: 10/10/1965  Smokeless tobacco  . Never Used     Counseling given: Not Answered   Past Medical History  Diagnosis Date  . CAD (coronary artery disease)     s/p  PTCI in 2003 and 2004 to the LAD.   . Diabetes mellitus   . Hypertension   . Hyperlipidemia   . Hypothyroid   . Arthritis   . Depression   . Internal hemorrhoids   . Thrombocytopenia ([place])   . TIA (transient ischemic attack) 2006    per patient's report.  He was never officially given this diagnosis  . Migraine headache   . Skin cancer, basal cell   . Thrombocytopenia, unspecified ([place]) 05/08/2013  . Allergy   . History of placement of ear tubes    Past Surgical History  Procedure Laterality Date  . Hernia repair  2007  . Fatty tissue removed      from neck 2003  . Thyroidectomy, partial  07/2002    right thyroid  . Coronary artery disease status post placement      of drug-eluting stent in the LAD in 2003,eEF 65% then  . Right toe bone spur surgery    . Tonsillectomy    . Colonoscopy  2011  . Polypectomy  2011    +TA   Family History  Problem Relation Age of Onset  . Mental illness Mother     alzheimers  . Heart disease Mother   . Heart disease Father     triple by pass  . Hypertension Brother     Prostate Cancer  . Prostate cancer Brother   . Hypertension Brother   . Hypertension Brother   . COPD Brother   .  Colon cancer Neg Hx   . Colon polyps Neg Hx   . Rectal cancer Neg Hx   . Stomach cancer Neg Hx    History  Sexual Activity  . Sexual Activity:  . Partners: Female    Outpatient Encounter Prescriptions as of 09/24/2015  Medication Sig  . aspirin 81 MG tablet Take 81 mg by mouth daily.    Marland Kitchen b complex vitamins tablet Take 1 tablet by mouth daily.  . Biotin 1000 MCG tablet Take 1,000 mcg by mouth daily.   . celecoxib (CELEBREX) 200 MG capsule Take 200 mg by mouth daily as needed. Reported on 09/23/2015  . Cinnamon 500 MG capsule Take 1,000 mg by mouth 2 (two) times daily.   . Coenzyme Q10 (COQ-10) 100 MG capsule Take 100 mg by mouth 2 (two) times daily.   Marland Kitchen ezetimibe-simvastatin (VYTORIN) 10-20 MG per tablet Take 1 tablet by mouth at bedtime.    .  fluticasone (FLONASE) 50 MCG/ACT nasal spray Place 2 sprays into both nostrils daily.  . Ginger, Zingiber officinalis, (GINGER ROOT) 550 MG CAPS Take 1 capsule by mouth daily.    Marland Kitchen levothyroxine (SYNTHROID, LEVOTHROID) 112 MCG tablet Take 112 mcg by mouth daily.    Marland Kitchen losartan-hydrochlorothiazide (HYZAAR) 100-12.5 MG per tablet Take 1 tablet by mouth daily.  . metFORMIN (GLUMETZA) 500 MG (MOD) 24 hr tablet Take 500 mg by mouth 2 (two) times daily with a meal.   . metoprolol succinate (TOPROL-XL) 100 MG 24 hr tablet Take 1 tablet (100 mg total) by mouth daily. Take with or immediately following a meal.  . multivitamin (THERAGRAN) per tablet Take 1 tablet by mouth daily.    . nitroGLYCERIN (NITROSTAT) 0.4 MG SL tablet Place 0.4 mg under the tongue every 5 (five) minutes as needed. Reported on 09/23/2015  . NON FORMULARY Take 1 capsule by mouth daily. ( Tumeric 500 mg )   . Omega-3 Fatty Acids (FISH OIL) 1200 MG CAPS Take 2 capsules by mouth 2 (two) times daily.    . Tamsulosin HCl (FLOMAX) 0.4 MG CAPS Take 0.4 mg by mouth daily. Reported on 09/23/2015  . fluorouracil (EFUDEX) 5 % cream Apply topically daily. Reported on 09/24/2015   No facility-administered encounter medications on file as of 09/24/2015.    Activities of Daily Living In your present state of health, do you have any difficulty performing the following activities: 09/24/2015  Hearing? Y  Vision? N  Difficulty concentrating or making decisions? N  Walking or climbing stairs? N  Dressing or bathing? N  Doing errands, shopping? N    Patient Care Team: Rosalita Chessman, DO as PCP - General Anda Kraft, MD as Consulting Physician (Endocrinology) Druscilla Brownie, MD as Consulting Physician (Dermatology) Lelon Perla, MD as Consulting Physician (Cardiology) Raynelle Bring, MD as Consulting Physician (Urology) Melissa Montane, MD as Consulting Physician (Otolaryngology) Ladene Artist, MD as Consulting Physician (Gastroenterology)    Assessment:    Exercise Activities and Dietary recommendations-- con't    Goals    None     Fall Risk Fall Risk  09/24/2015 09/09/2014 08/27/2013  Falls in the past year? No No No   Depression Screen PHQ 2/9 Scores 09/24/2015 09/09/2014 08/27/2013 08/27/2013  PHQ - 2 Score 0 0 0 0    Cognitive Testing No flowsheet data found.  Immunization History  Administered Date(s) Administered  . Influenza Split 07/26/2011  . Influenza Whole 08/30/2007, 07/08/2009, 07/06/2010  . Influenza, High Dose Seasonal PF 07/11/2014  . Influenza,inj,Quad PF,36+  Mos 07/05/2013  . Influenza-Unspecified 08/19/2015  . Pneumococcal Conjugate-13 12/17/2013  . Pneumococcal Polysaccharide-23 04/23/2003, 07/08/2009  . Td 02/19/2003  . Tdap 09/19/2013  . Zoster 02/21/2007   Screening Tests Health Maintenance  Topic Date Due  . Hepatitis C Screening  1945-11-04  . OPHTHALMOLOGY EXAM  09/20/2014  . HEMOGLOBIN A1C  03/11/2015  . URINE MICROALBUMIN  09/10/2015  . INFLUENZA VACCINE  05/10/2016  . FOOT EXAM  09/23/2016  . COLONOSCOPY  08/16/2020  . TETANUS/TDAP  09/20/2023  . ZOSTAVAX  Completed  . PNA vac Low Risk Adult  Completed      Plan:    During the course of the visit the patient was educated and counseled about the following appropriate screening and preventive services:   Vaccines to include Pneumoccal, Influenza, Hepatitis B, Td, Zostavax, HCV  Electrocardiogram  Cardiovascular Disease  Colorectal cancer screening  Diabetes screening  Prostate Cancer Screening  Glaucoma screening  Nutrition counseling   Smoking cessation counseling  Patient Instructions (the written plan) was given to the patient.    Garnet Koyanagi, DO  09/24/2015

## 2015-09-24 NOTE — Patient Instructions (Signed)

## 2015-09-24 NOTE — Progress Notes (Signed)
Pre visit review using our clinic review tool, if applicable. No additional management support is needed unless otherwise documented below in the visit note. 

## 2015-09-28 DIAGNOSIS — H2513 Age-related nuclear cataract, bilateral: Secondary | ICD-10-CM | POA: Diagnosis not present

## 2015-09-28 DIAGNOSIS — H52203 Unspecified astigmatism, bilateral: Secondary | ICD-10-CM | POA: Diagnosis not present

## 2015-09-28 DIAGNOSIS — E119 Type 2 diabetes mellitus without complications: Secondary | ICD-10-CM | POA: Diagnosis not present

## 2015-09-28 LAB — HM DIABETES EYE EXAM

## 2015-10-13 ENCOUNTER — Encounter: Payer: Self-pay | Admitting: Family Medicine

## 2015-10-14 ENCOUNTER — Other Ambulatory Visit: Payer: Self-pay

## 2015-10-14 MED ORDER — FLUTICASONE PROPIONATE 50 MCG/ACT NA SUSP: 2.0000 | Freq: Every day | NASAL | Status: DC

## 2015-11-06 NOTE — Progress Notes (Signed)
HPI: FU CAD with prior PCI of his LAD in May 2003. His last cardiac catheterization was performed on December 16, 2008 by Dr. Olevia Perches for exertional chest pain. He was found to have a 30-40% LAD just distal to the stent. There was no other obstructive disease noted. The ejection fraction is 50%. He has been treated medically. Abdominal ultrasound in Oct 2011 showed no aneurysm. Nuclear study in January of 2014 showed an ejection fraction of 61%. There was no ischemia. Since I last saw him, the patient denies any dyspnea on exertion, orthopnea, PND, pedal edema, palpitations, syncope or chest pain.   Current Outpatient Prescriptions  Medication Sig Dispense Refill  . aspirin 81 MG tablet Take 81 mg by mouth daily.      . Biotin 1000 MCG tablet Take 1,000 mcg by mouth daily.     . celecoxib (CELEBREX) 200 MG capsule Take 200 mg by mouth daily as needed. Reported on 09/23/2015    . Cinnamon 500 MG capsule Take 1,000 mg by mouth 2 (two) times daily.     . Coenzyme Q10 (COQ-10) 100 MG capsule Take 100 mg by mouth 2 (two) times daily.     Marland Kitchen ezetimibe-simvastatin (VYTORIN) 10-20 MG per tablet Take 1 tablet by mouth at bedtime.      . fluticasone (FLONASE) 50 MCG/ACT nasal spray Place 2 sprays into both nostrils daily. 16 g 5  . Ginger, Zingiber officinalis, (GINGER ROOT) 550 MG CAPS Take 1 capsule by mouth daily.      Marland Kitchen levothyroxine (SYNTHROID, LEVOTHROID) 112 MCG tablet Take 112 mcg by mouth daily.      Marland Kitchen losartan-hydrochlorothiazide (HYZAAR) 100-12.5 MG per tablet Take 1 tablet by mouth daily. 30 tablet 11  . metFORMIN (GLUMETZA) 500 MG (MOD) 24 hr tablet Take 500 mg by mouth 2 (two) times daily with a meal.     . metoprolol succinate (TOPROL-XL) 100 MG 24 hr tablet Take 1 tablet (100 mg total) by mouth daily. Take with or immediately following a meal. 90 tablet 3  . multivitamin (THERAGRAN) per tablet Take 1 tablet by mouth daily.      . nitroGLYCERIN (NITROSTAT) 0.4 MG SL tablet Place 0.4 mg  under the tongue every 5 (five) minutes as needed. Reported on 09/23/2015    . NON FORMULARY Take 1 capsule by mouth daily. ( Tumeric 500 mg )     . Omega-3 Fatty Acids (FISH OIL) 1200 MG CAPS Take 2 capsules by mouth 2 (two) times daily.      . Tamsulosin HCl (FLOMAX) 0.4 MG CAPS Take 0.4 mg by mouth daily. Reported on 09/23/2015     No current facility-administered medications for this visit.     Past Medical History  Diagnosis Date  . CAD (coronary artery disease)     s/p PTCI in 2003 and 2004 to the LAD.   . Diabetes mellitus   . Hypertension   . Hyperlipidemia   . Hypothyroid   . Arthritis   . Depression   . Internal hemorrhoids   . Thrombocytopenia ([place])   . TIA (transient ischemic attack) 2006    per patient's report.  He was never officially given this diagnosis  . Migraine headache   . Skin cancer, basal cell   . Thrombocytopenia, unspecified ([place]) 05/08/2013  . Allergy   . History of placement of ear tubes     Past Surgical History  Procedure Laterality Date  . Hernia repair  2007  . Fatty  tissue removed      from neck 2003  . Thyroidectomy, partial  07/2002    right thyroid  . Coronary artery disease status post placement      of drug-eluting stent in the LAD in 2003,eEF 65% then  . Right toe bone spur surgery    . Tonsillectomy    . Colonoscopy  2011  . Polypectomy  2011    +TA    Social History   Social History  . Marital Status: Married    Spouse Name: N/A  . Number of Children: 0  . Years of Education: N/A   Occupational History  . retired     Optometrist   Social History Main Topics  . Smoking status: Former Smoker -- 5 years    Types: Cigarettes    Quit date: 10/10/1965  . Smokeless tobacco: Never Used  . Alcohol Use: 0.0 oz/week    0 Standard drinks or equivalent per week     Comment: monthly on a social basis   . Drug Use: No  . Sexual Activity:    Partners: Female   Other Topics Concern  . Not on file   Social History  Narrative   Exercise-- treadmill, stationary bike       Family History  Problem Relation Age of Onset  . Mental illness Mother     alzheimers  . Heart disease Mother   . Heart disease Father     triple by pass  . Hypertension Brother     Prostate Cancer  . Prostate cancer Brother   . Hypertension Brother   . Hypertension Brother   . COPD Brother   . Colon cancer Neg Hx   . Colon polyps Neg Hx   . Rectal cancer Neg Hx   . Stomach cancer Neg Hx     ROS: Headache but no fevers or chills, productive cough, hemoptysis, dysphasia, odynophagia, melena, hematochezia, dysuria, hematuria, rash, seizure activity, orthopnea, PND, pedal edema, claudication. Remaining systems are negative.  Physical Exam: Well-developed well-nourished in no acute distress.  Skin is warm and dry.  HEENT is normal.  Neck is supple.  Chest is clear to auscultation with normal expansion.  Cardiovascular exam is regular rate and rhythm.  Abdominal exam nontender or distended. No masses palpated. Extremities show no edema. neuro grossly intact  ECG Sinus rhythm at a rate of 61. No ST changes.

## 2015-11-11 DIAGNOSIS — Z0000 Encounter for general adult medical examination without abnormal findings: Secondary | ICD-10-CM | POA: Diagnosis not present

## 2015-11-11 DIAGNOSIS — N401 Enlarged prostate with lower urinary tract symptoms: Secondary | ICD-10-CM | POA: Diagnosis not present

## 2015-11-11 DIAGNOSIS — R972 Elevated prostate specific antigen [PSA]: Secondary | ICD-10-CM | POA: Diagnosis not present

## 2015-11-11 DIAGNOSIS — R3916 Straining to void: Secondary | ICD-10-CM | POA: Diagnosis not present

## 2015-11-13 ENCOUNTER — Encounter: Payer: Self-pay | Admitting: Cardiology

## 2015-11-13 ENCOUNTER — Ambulatory Visit (INDEPENDENT_AMBULATORY_CARE_PROVIDER_SITE_OTHER): Payer: Medicare Other | Admitting: Cardiology

## 2015-11-13 VITALS — BP 122/68 | HR 61 | Ht 69.0 in | Wt 180.0 lb

## 2015-11-13 DIAGNOSIS — I2510 Atherosclerotic heart disease of native coronary artery without angina pectoris: Secondary | ICD-10-CM | POA: Diagnosis not present

## 2015-11-13 DIAGNOSIS — I10 Essential (primary) hypertension: Secondary | ICD-10-CM

## 2015-11-13 DIAGNOSIS — E785 Hyperlipidemia, unspecified: Secondary | ICD-10-CM

## 2015-11-13 MED ORDER — LOSARTAN POTASSIUM-HCTZ 100-12.5 MG PO TABS: 1.0000 | ORAL_TABLET | Freq: Every day | ORAL | Status: DC

## 2015-11-13 MED ORDER — METOPROLOL SUCCINATE ER 100 MG PO TB24: 100.0000 mg | ORAL_TABLET | Freq: Every day | ORAL | Status: DC

## 2015-11-13 NOTE — Assessment & Plan Note (Signed)
Blood pressure controlled. Continue present medications. 

## 2015-11-13 NOTE — Patient Instructions (Signed)
Your physician wants you to follow-up in: ONE YEAR WITH DR CRENSHAW You will receive a reminder letter in the mail two months in advance. If you don't receive a letter, please call our office to schedule the follow-up appointment.   If you need a refill on your cardiac medications before your next appointment, please call your pharmacy.  

## 2015-11-13 NOTE — Assessment & Plan Note (Signed)
Continue aspirin and statin. 

## 2015-11-13 NOTE — Assessment & Plan Note (Signed)
Continue statin.d

## 2015-11-17 DIAGNOSIS — L570 Actinic keratosis: Secondary | ICD-10-CM | POA: Diagnosis not present

## 2015-11-30 DIAGNOSIS — H6983 Other specified disorders of Eustachian tube, bilateral: Secondary | ICD-10-CM | POA: Insufficient documentation

## 2015-11-30 DIAGNOSIS — Z974 Presence of external hearing-aid: Secondary | ICD-10-CM | POA: Diagnosis not present

## 2015-11-30 DIAGNOSIS — H6993 Unspecified Eustachian tube disorder, bilateral: Secondary | ICD-10-CM

## 2015-11-30 DIAGNOSIS — H903 Sensorineural hearing loss, bilateral: Secondary | ICD-10-CM | POA: Insufficient documentation

## 2015-11-30 DIAGNOSIS — Z9622 Myringotomy tube(s) status: Secondary | ICD-10-CM | POA: Diagnosis not present

## 2015-11-30 DIAGNOSIS — H6982 Other specified disorders of Eustachian tube, left ear: Secondary | ICD-10-CM | POA: Diagnosis not present

## 2015-11-30 HISTORY — DX: Other specified disorders of eustachian tube, bilateral: H69.83

## 2015-11-30 HISTORY — DX: Unspecified eustachian tube disorder, bilateral: H69.93

## 2015-11-30 HISTORY — DX: Sensorineural hearing loss, bilateral: H90.3

## 2016-02-11 DIAGNOSIS — E034 Atrophy of thyroid (acquired): Secondary | ICD-10-CM | POA: Diagnosis not present

## 2016-02-11 DIAGNOSIS — E118 Type 2 diabetes mellitus with unspecified complications: Secondary | ICD-10-CM | POA: Diagnosis not present

## 2016-02-11 DIAGNOSIS — E119 Type 2 diabetes mellitus without complications: Secondary | ICD-10-CM | POA: Diagnosis not present

## 2016-02-11 DIAGNOSIS — D696 Thrombocytopenia, unspecified: Secondary | ICD-10-CM | POA: Diagnosis not present

## 2016-02-11 LAB — HEMOGLOBIN A1C: Hemoglobin A1C: 5.4

## 2016-02-18 DIAGNOSIS — I10 Essential (primary) hypertension: Secondary | ICD-10-CM | POA: Diagnosis not present

## 2016-02-18 DIAGNOSIS — E789 Disorder of lipoprotein metabolism, unspecified: Secondary | ICD-10-CM | POA: Diagnosis not present

## 2016-03-24 ENCOUNTER — Ambulatory Visit (INDEPENDENT_AMBULATORY_CARE_PROVIDER_SITE_OTHER): Payer: Medicare Other | Admitting: Family Medicine

## 2016-03-24 ENCOUNTER — Encounter: Payer: Self-pay | Admitting: Family Medicine

## 2016-03-24 VITALS — BP 132/76 | HR 76 | Temp 97.6°F | Ht 69.0 in | Wt 173.4 lb

## 2016-03-24 DIAGNOSIS — E1151 Type 2 diabetes mellitus with diabetic peripheral angiopathy without gangrene: Secondary | ICD-10-CM

## 2016-03-24 DIAGNOSIS — I2510 Atherosclerotic heart disease of native coronary artery without angina pectoris: Secondary | ICD-10-CM | POA: Diagnosis not present

## 2016-03-24 DIAGNOSIS — E785 Hyperlipidemia, unspecified: Secondary | ICD-10-CM | POA: Diagnosis not present

## 2016-03-24 DIAGNOSIS — J302 Other seasonal allergic rhinitis: Secondary | ICD-10-CM

## 2016-03-24 DIAGNOSIS — E039 Hypothyroidism, unspecified: Secondary | ICD-10-CM

## 2016-03-24 DIAGNOSIS — N400 Enlarged prostate without lower urinary tract symptoms: Secondary | ICD-10-CM

## 2016-03-24 DIAGNOSIS — I10 Essential (primary) hypertension: Secondary | ICD-10-CM | POA: Diagnosis not present

## 2016-03-24 DIAGNOSIS — E038 Other specified hypothyroidism: Secondary | ICD-10-CM

## 2016-03-24 NOTE — Patient Instructions (Signed)
Hypertension Hypertension, commonly called high blood pressure, is when the force of blood pumping through your arteries is too strong. Your arteries are the blood vessels that carry blood from your heart throughout your body. A blood pressure reading consists of a higher number over a lower number, such as 110/72. The higher number (systolic) is the pressure inside your arteries when your heart pumps. The lower number (diastolic) is the pressure inside your arteries when your heart relaxes. Ideally you want your blood pressure below 120/80. Hypertension forces your heart to work harder to pump blood. Your arteries may become narrow or stiff. Having untreated or uncontrolled hypertension can cause heart attack, stroke, kidney disease, and other problems. RISK FACTORS Some risk factors for high blood pressure are controllable. Others are not.  Risk factors you cannot control include:   Race. You may be at higher risk if you are African American.  Age. Risk increases with age.  Gender. Men are at higher risk than women before age 45 years. After age 65, women are at higher risk than men. Risk factors you can control include:  Not getting enough exercise or physical activity.  Being overweight.  Getting too much fat, sugar, calories, or salt in your diet.  Drinking too much alcohol. SIGNS AND SYMPTOMS Hypertension does not usually cause signs or symptoms. Extremely high blood pressure (hypertensive crisis) may cause headache, anxiety, shortness of breath, and nosebleed. DIAGNOSIS To check if you have hypertension, your health care provider will measure your blood pressure while you are seated, with your arm held at the level of your heart. It should be measured at least twice using the same arm. Certain conditions can cause a difference in blood pressure between your right and left arms. A blood pressure reading that is higher than normal on one occasion does not mean that you need treatment. If  it is not clear whether you have high blood pressure, you may be asked to return on a different day to have your blood pressure checked again. Or, you may be asked to monitor your blood pressure at home for 1 or more weeks. TREATMENT Treating high blood pressure includes making lifestyle changes and possibly taking medicine. Living a healthy lifestyle can help lower high blood pressure. You may need to change some of your habits. Lifestyle changes may include:  Following the DASH diet. This diet is high in fruits, vegetables, and whole grains. It is low in salt, red meat, and added sugars.  Keep your sodium intake below 2,300 mg per day.  Getting at least 30-45 minutes of aerobic exercise at least 4 times per week.  Losing weight if necessary.  Not smoking.  Limiting alcoholic beverages.  Learning ways to reduce stress. Your health care provider may prescribe medicine if lifestyle changes are not enough to get your blood pressure under control, and if one of the following is true:  You are 18-59 years of age and your systolic blood pressure is above 140.  You are 60 years of age or older, and your systolic blood pressure is above 150.  Your diastolic blood pressure is above 90.  You have diabetes, and your systolic blood pressure is over 140 or your diastolic blood pressure is over 90.  You have kidney disease and your blood pressure is above 140/90.  You have heart disease and your blood pressure is above 140/90. Your personal target blood pressure may vary depending on your medical conditions, your age, and other factors. HOME CARE INSTRUCTIONS    Have your blood pressure rechecked as directed by your health care provider.   Take medicines only as directed by your health care provider. Follow the directions carefully. Blood pressure medicines must be taken as prescribed. The medicine does not work as well when you skip doses. Skipping doses also puts you at risk for  problems.  Do not smoke.   Monitor your blood pressure at home as directed by your health care provider. SEEK MEDICAL CARE IF:   You think you are having a reaction to medicines taken.  You have recurrent headaches or feel dizzy.  You have swelling in your ankles.  You have trouble with your vision. SEEK IMMEDIATE MEDICAL CARE IF:  You develop a severe headache or confusion.  You have unusual weakness, numbness, or feel faint.  You have severe chest or abdominal pain.  You vomit repeatedly.  You have trouble breathing. MAKE SURE YOU:   Understand these instructions.  Will watch your condition.  Will get help right away if you are not doing well or get worse.   This information is not intended to replace advice given to you by your health care provider. Make sure you discuss any questions you have with your health care provider.   Document Released: 09/26/2005 Document Revised: 02/10/2015 Document Reviewed: 07/19/2013 Elsevier Interactive Patient Education 2016 [employer] Inc[employer]  

## 2016-03-24 NOTE — Assessment & Plan Note (Signed)
Per endo °

## 2016-03-24 NOTE — Assessment & Plan Note (Signed)
Per cardiology 

## 2016-03-24 NOTE — Progress Notes (Signed)
Pre visit review using our clinic review tool, if applicable. No additional management support is needed unless otherwise documented below in the visit note. 

## 2016-03-24 NOTE — Progress Notes (Signed)
Patient ID: [name], male    DOB: Aug 01, 1946  Age: 70 y.o. MRN: [id number]    Subjective:  Subjective HPI [name] presents for f/u thyroid, cholesterol, htn --- sees specialists at least 1-2 x a year   Review of Systems  Constitutional: Negative for diaphoresis, appetite change, fatigue and unexpected weight change.  Eyes: Negative for pain, redness and visual disturbance.  Respiratory: Negative for cough, chest tightness, shortness of breath and wheezing.   Cardiovascular: Negative for chest pain, palpitations and leg swelling.  Endocrine: Negative for cold intolerance, heat intolerance, polydipsia, polyphagia and polyuria.  Genitourinary: Negative for dysuria, frequency and difficulty urinating.  Neurological: Negative for dizziness, light-headedness, numbness and headaches.    History Past Medical History  Diagnosis Date  . CAD (coronary artery disease)     s/p PTCI in 2003 and 2004 to the LAD.   . Diabetes mellitus   . Hypertension   . Hyperlipidemia   . Hypothyroid   . Arthritis   . Depression   . Internal hemorrhoids   . Thrombocytopenia ([place])   . TIA (transient ischemic attack) 2006    per patient's report.  He was never officially given this diagnosis  . Migraine headache   . Skin cancer, basal cell   . Thrombocytopenia, unspecified ([place]) 05/08/2013  . Allergy   . History of placement of ear tubes     He has past surgical history that includes Hernia repair (2007); fatty tissue removed; Thyroidectomy, partial (07/2002); coronary artery disease status post placement; right toe bone spur surgery; Tonsillectomy; Colonoscopy (2011); and Polypectomy (2011).   His family history includes COPD in his brother; Heart disease in his father and mother; Hypertension in his brother, brother, and brother; Mental illness in his mother; Prostate cancer in his brother. There is no history of Colon cancer, Colon polyps, Rectal cancer, or Stomach cancer.He reports that he  quit smoking about 50 years ago. His smoking use included Cigarettes. He quit after 5 years of use. He has never used smokeless tobacco. He reports that he drinks alcohol. He reports that he does not use illicit drugs.  Current Outpatient Prescriptions on File Prior to Visit  Medication Sig Dispense Refill  . aspirin 81 MG tablet Take 81 mg by mouth daily.      . Biotin 1000 MCG tablet Take 1,000 mcg by mouth daily.     . celecoxib (CELEBREX) 200 MG capsule Take 200 mg by mouth daily as needed. Reported on 09/23/2015    . Cinnamon 500 MG capsule Take 1,000 mg by mouth 2 (two) times daily.     . Coenzyme Q10 (COQ-10) 100 MG capsule Take 100 mg by mouth 2 (two) times daily.     . Ginger, Zingiber officinalis, (GINGER ROOT) 550 MG CAPS Take 1 capsule by mouth daily.      . multivitamin (THERAGRAN) per tablet Take 1 tablet by mouth daily.      . NON FORMULARY Take 1 capsule by mouth daily. ( Tumeric 500 mg )     . Omega-3 Fatty Acids (FISH OIL) 1200 MG CAPS Take 2 capsules by mouth 2 (two) times daily.       No current facility-administered medications on file prior to visit.     Objective:  Objective Physical Exam  Constitutional: He is oriented to person, place, and time. Vital signs are normal. He appears well-developed and well-nourished. He is sleeping.  HENT:  Head: Normocephalic and atraumatic.  Mouth/Throat: Oropharynx is clear and  moist.  Eyes: EOM are normal. Pupils are equal, round, and reactive to light.  Neck: Normal range of motion. Neck supple. No thyromegaly present.  Cardiovascular: Normal rate and regular rhythm.   No murmur heard. Pulmonary/Chest: Effort normal and breath sounds normal. No respiratory distress. He has no wheezes. He has no rales. He exhibits no tenderness.  Musculoskeletal: He exhibits no edema or tenderness.  Neurological: He is alert and oriented to person, place, and time.  Skin: Skin is warm and dry.  Psychiatric: He has a normal mood and affect.  His behavior is normal. Judgment and thought content normal.  Nursing note and vitals reviewed.  BP 132/76 mmHg  Pulse 76  Temp(Src) 97.6 F (36.4 C) (Oral)  Ht 5\' 9"  (1.753 m)  Wt 173 lb 6.4 oz (78.654 kg)  BMI 25.60 kg/m2  SpO2 96% Wt Readings from Last 3 Encounters:  03/24/16 173 lb 6.4 oz (78.654 kg)  11/13/15 180 lb (81.647 kg)  09/24/15 166 lb 12.8 oz (75.66 kg)     Lab Results  Component Value Date   WBC 7.4 09/24/2015   HGB 17.2* 09/24/2015   HCT 50.3 09/24/2015   PLT 159.0 09/24/2015   GLUCOSE 63* 09/24/2015   CHOL 120 09/24/2015   TRIG 85.0 09/24/2015   HDL 57.20 09/24/2015   LDLCALC 45 09/24/2015   ALT 22 09/24/2015   AST 22 09/24/2015   NA 140 09/24/2015   K 4.1 09/24/2015   CL 103 09/24/2015   CREATININE 1.23 09/24/2015   BUN 22 09/24/2015   CO2 34* 09/24/2015   TSH 2.12 09/24/2015   PSA 3.42 09/24/2015   INR 1.0 RATIO 12/11/2008   HGBA1C 5.4 02/11/2016   MICROALBUR 2.5* 09/24/2015    Dg Chest 2 View  03/31/2015  CLINICAL DATA:  Cough, congestion for 3 weeks, former smoking history EXAM: CHEST  2 VIEW COMPARISON:  Dictated report from chest x-ray of 02/22/1999 30 FINDINGS: The lungs are clear and somewhat hyper aerated. Minimal peribronchial thickening is noted. Mediastinal and hilar contours are unremarkable. The heart is within normal limits in size. No bony abnormality is seen. IMPRESSION: No active lung disease.  Minimal peribronchial thickening. Electronically Signed   By: Ivar Drape M.D.   On: 03/31/2015 14:34     Assessment & Plan:  Plan I have discontinued Mr. Arreola ezetimibe-simvastatin and nitroGLYCERIN. I have also changed his tamsulosin, simvastatin, metFORMIN, levothyroxine, and ezetimibe. Additionally, I am having him maintain his aspirin, Biotin, multivitamin, Fish Oil, CoQ-10, Cinnamon, Ginger Root, NON FORMULARY, celecoxib, Turmeric, magnesium, metoprolol succinate, losartan-hydrochlorothiazide, and fluticasone.  Meds ordered this  encounter  Medications  . DISCONTD: simvastatin (ZOCOR) 20 MG tablet    Sig: Take 1 tablet by mouth daily.    Refill:  0  . DISCONTD: ezetimibe (ZETIA) 10 MG tablet    Sig: Take 10 mg by mouth daily.  . Turmeric 500 MG CAPS    Sig: Take 1 capsule by mouth daily.  . magnesium 30 MG tablet    Sig: Take 30 mg by mouth 2 (two) times daily.  . tamsulosin (FLOMAX) 0.4 MG CAPS capsule    Sig: Take 1 capsule (0.4 mg total) by mouth daily. Reported on 09/23/2015    Dispense:  30 capsule  . simvastatin (ZOCOR) 20 MG tablet    Sig: Take 1 tablet (20 mg total) by mouth daily.    Dispense:  30 tablet    Refill:  0  . metoprolol succinate (TOPROL-XL) 100 MG 24 hr tablet  Sig: Take 1 tablet (100 mg total) by mouth daily. Take with or immediately following a meal.    Dispense:  90 tablet    Refill:  3  . metFORMIN (GLUMETZA) 500 MG (MOD) 24 hr tablet    Sig: Take 1 tablet (500 mg total) by mouth 2 (two) times daily with a meal.  . losartan-hydrochlorothiazide (HYZAAR) 100-12.5 MG tablet    Sig: Take 1 tablet by mouth daily.    Dispense:  30 tablet    Refill:  11  . levothyroxine (SYNTHROID, LEVOTHROID) 112 MCG tablet    Sig: Take 1 tablet (112 mcg total) by mouth daily.  . fluticasone (FLONASE) 50 MCG/ACT nasal spray    Sig: Place 2 sprays into both nostrils daily.    Dispense:  16 g    Refill:  5  . ezetimibe (ZETIA) 10 MG tablet    Sig: Take 1 tablet (10 mg total) by mouth daily.    Problem List Items Addressed This Visit    CAD, NATIVE VESSEL    Per cardiology      Relevant Medications   simvastatin (ZOCOR) 20 MG tablet   metoprolol succinate (TOPROL-XL) 100 MG 24 hr tablet   losartan-hydrochlorothiazide (HYZAAR) 100-12.5 MG tablet   ezetimibe (ZETIA) 10 MG tablet   DM (diabetes mellitus) type II controlled peripheral vascular disorder (HCC)    Per endo      Relevant Medications   simvastatin (ZOCOR) 20 MG tablet   metoprolol succinate (TOPROL-XL) 100 MG 24 hr tablet    metFORMIN (GLUMETZA) 500 MG (MOD) 24 hr tablet   losartan-hydrochlorothiazide (HYZAAR) 100-12.5 MG tablet   ezetimibe (ZETIA) 10 MG tablet   Hyperlipidemia    Per endo      Relevant Medications   simvastatin (ZOCOR) 20 MG tablet   metoprolol succinate (TOPROL-XL) 100 MG 24 hr tablet   losartan-hydrochlorothiazide (HYZAAR) 100-12.5 MG tablet   ezetimibe (ZETIA) 10 MG tablet   Hypothyroidism    Per endo      Relevant Medications   metoprolol succinate (TOPROL-XL) 100 MG 24 hr tablet   levothyroxine (SYNTHROID, LEVOTHROID) 112 MCG tablet    Other Visit Diagnoses    Essential hypertension    -  Primary    Relevant Medications    simvastatin (ZOCOR) 20 MG tablet    metoprolol succinate (TOPROL-XL) 100 MG 24 hr tablet    losartan-hydrochlorothiazide (HYZAAR) 100-12.5 MG tablet    ezetimibe (ZETIA) 10 MG tablet    Hyperlipemia        Relevant Medications    simvastatin (ZOCOR) 20 MG tablet    metoprolol succinate (TOPROL-XL) 100 MG 24 hr tablet    losartan-hydrochlorothiazide (HYZAAR) 100-12.5 MG tablet    ezetimibe (ZETIA) 10 MG tablet    Hypothyroidism, unspecified hypothyroidism type        Relevant Medications    metoprolol succinate (TOPROL-XL) 100 MG 24 hr tablet    levothyroxine (SYNTHROID, LEVOTHROID) 112 MCG tablet    ezetimibe (ZETIA) 10 MG tablet    BPH (benign prostatic hypertrophy)        Relevant Medications    tamsulosin (FLOMAX) 0.4 MG CAPS capsule    Seasonal allergies        Relevant Medications    fluticasone (FLONASE) 50 MCG/ACT nasal spray       Follow-up: Return in about 6 months (around 09/23/2016), or if symptoms worsen or fail to improve, for annual exam, fasting.---will only need to see the pt annually after that secondary to seeing  all the specialists.    [name], DO

## 2016-07-25 DIAGNOSIS — H6983 Other specified disorders of Eustachian tube, bilateral: Secondary | ICD-10-CM | POA: Diagnosis not present

## 2016-07-25 DIAGNOSIS — T162XXA Foreign body in left ear, initial encounter: Secondary | ICD-10-CM | POA: Diagnosis not present

## 2016-07-25 DIAGNOSIS — H903 Sensorineural hearing loss, bilateral: Secondary | ICD-10-CM | POA: Diagnosis not present

## 2016-08-09 ENCOUNTER — Encounter: Payer: Self-pay | Admitting: Family Medicine

## 2016-08-09 ENCOUNTER — Ambulatory Visit (INDEPENDENT_AMBULATORY_CARE_PROVIDER_SITE_OTHER): Payer: Medicare Other | Admitting: Family Medicine

## 2016-08-09 VITALS — BP 162/82 | HR 64 | Temp 97.9°F | Resp 16 | Ht 69.0 in | Wt 178.8 lb

## 2016-08-09 DIAGNOSIS — Z8669 Personal history of other diseases of the nervous system and sense organs: Secondary | ICD-10-CM | POA: Diagnosis not present

## 2016-08-09 DIAGNOSIS — I2510 Atherosclerotic heart disease of native coronary artery without angina pectoris: Secondary | ICD-10-CM

## 2016-08-09 MED ORDER — TRAMADOL HCL 50 MG PO TABS: 50.0000 mg | ORAL_TABLET | Freq: Three times a day (TID) | ORAL | 0 refills | Status: DC | PRN

## 2016-08-09 NOTE — Patient Instructions (Signed)

## 2016-08-09 NOTE — Progress Notes (Signed)
Patient ID: [name], male    DOB: 09-23-1946  Age: 70 y.o. MRN: [id number]    Subjective:  Subjective  HPI [name] presents for c/o migraines that are worsening --- he has had these for years but they recently went to [facility] and headache started 10 days ago and he is now in the "hangover" stage.   He feels like his head is bouncing around.   He does have an aura -- spots.  psychadyllic lights-- if he can take ginger tea during aura he does not get the headache but he does get the hangover.  Lights and noise can make it worse and light can bring it on.  He does get nausea and vomiting.  Headaches are becoming more frequent and more severe.     Review of Systems  Constitutional: Negative for appetite change, diaphoresis, fatigue and unexpected weight change.  Eyes: Negative for pain, redness and visual disturbance.  Respiratory: Negative for cough, chest tightness, shortness of breath and wheezing.   Cardiovascular: Negative for chest pain, palpitations and leg swelling.  Gastrointestinal: Positive for nausea and vomiting.  Endocrine: Negative for cold intolerance, heat intolerance, polydipsia, polyphagia and polyuria.  Genitourinary: Negative for difficulty urinating, dysuria and frequency.  Neurological: Positive for headaches. Negative for dizziness, light-headedness and numbness.    History Past Medical History:  Diagnosis Date  . Allergy   . Arthritis   . CAD (coronary artery disease)    s/p PTCI in 2003 and 2004 to the LAD.   Marland Kitchen Depression   . Diabetes mellitus   . History of placement of ear tubes   . Hyperlipidemia   . Hypertension   . Hypothyroid   . Internal hemorrhoids   . Migraine headache   . Skin cancer, basal cell   . Thrombocytopenia ([place])   . Thrombocytopenia, unspecified 05/08/2013  . TIA (transient ischemic attack) 2006   per patient's report.  He was never officially given this diagnosis    He has a past surgical history that includes Hernia repair  (2007); fatty tissue removed; Thyroidectomy, partial (07/2002); coronary artery disease status post placement; right toe bone spur surgery; Tonsillectomy; Colonoscopy (2011); and Polypectomy (2011).   His family history includes COPD in his brother; Heart disease in his father and mother; Hypertension in his brother, brother, and brother; Mental illness in his mother; Prostate cancer in his brother.He reports that he quit smoking about 50 years ago. His smoking use included Cigarettes. He quit after 5.00 years of use. He has never used smokeless tobacco. He reports that he drinks alcohol. He reports that he does not use drugs.  Current Outpatient Prescriptions on File Prior to Visit  Medication Sig Dispense Refill  . aspirin 81 MG tablet Take 81 mg by mouth daily.      . Biotin 1000 MCG tablet Take 1,000 mcg by mouth daily.     . celecoxib (CELEBREX) 200 MG capsule Take 200 mg by mouth daily as needed. Reported on 09/23/2015    . Cinnamon 500 MG capsule Take 1,000 mg by mouth 2 (two) times daily.     . Coenzyme Q10 (COQ-10) 100 MG capsule Take 100 mg by mouth 2 (two) times daily.     Marland Kitchen ezetimibe (ZETIA) 10 MG tablet Take 1 tablet (10 mg total) by mouth daily.    . fluticasone (FLONASE) 50 MCG/ACT nasal spray Place 2 sprays into both nostrils daily. 16 g 5  . Ginger, Zingiber officinalis, (GINGER ROOT) 550 MG CAPS  Take 1 capsule by mouth daily.      Marland Kitchen levothyroxine (SYNTHROID, LEVOTHROID) 112 MCG tablet Take 1 tablet (112 mcg total) by mouth daily.    Marland Kitchen losartan-hydrochlorothiazide (HYZAAR) 100-12.5 MG tablet Take 1 tablet by mouth daily. 30 tablet 11  . magnesium 30 MG tablet Take 30 mg by mouth 2 (two) times daily.    . metFORMIN (GLUMETZA) 500 MG (MOD) 24 hr tablet Take 1 tablet (500 mg total) by mouth 2 (two) times daily with a meal.    . metoprolol succinate (TOPROL-XL) 100 MG 24 hr tablet Take 1 tablet (100 mg total) by mouth daily. Take with or immediately following a meal. 90 tablet 3  .  multivitamin (THERAGRAN) per tablet Take 1 tablet by mouth daily.      . NON FORMULARY Take 1 capsule by mouth daily. ( Tumeric 500 mg )     . Omega-3 Fatty Acids (FISH OIL) 1200 MG CAPS Take 2 capsules by mouth 2 (two) times daily.      . simvastatin (ZOCOR) 20 MG tablet Take 1 tablet (20 mg total) by mouth daily. 30 tablet 0  . tamsulosin (FLOMAX) 0.4 MG CAPS capsule Take 1 capsule (0.4 mg total) by mouth daily. Reported on 09/23/2015 30 capsule   . Turmeric 500 MG CAPS Take 1 capsule by mouth daily.     No current facility-administered medications on file prior to visit.      Objective:  Objective  Physical Exam  Constitutional: He is oriented to person, place, and time. Vital signs are normal. He appears well-developed and well-nourished. He is sleeping.  HENT:  Head: Normocephalic and atraumatic.  Mouth/Throat: Oropharynx is clear and moist.  Eyes: EOM are normal. Pupils are equal, round, and reactive to light.  Neck: Normal range of motion. Neck supple. No thyromegaly present.  Cardiovascular: Normal rate and regular rhythm.   No murmur heard. Pulmonary/Chest: Effort normal and breath sounds normal. No respiratory distress. He has no wheezes. He has no rales. He exhibits no tenderness.  Musculoskeletal: He exhibits no edema or tenderness.  Neurological: He is alert and oriented to person, place, and time.  Skin: Skin is warm and dry.  Psychiatric: He has a normal mood and affect. His behavior is normal. Judgment and thought content normal.  Nursing note and vitals reviewed.  BP (!) 162/82 (BP Location: Left Arm, Patient Position: Sitting, Cuff Size: Normal)   Pulse 64   Temp 97.9 F (36.6 C) (Oral)   Resp 16   Ht 5\' 9"  (1.753 m)   Wt 178 lb 12.8 oz (81.1 kg)   SpO2 97%   BMI 26.40 kg/m  Wt Readings from Last 3 Encounters:  08/09/16 178 lb 12.8 oz (81.1 kg)  03/24/16 173 lb 6.4 oz (78.7 kg)  11/13/15 180 lb (81.6 kg)     Lab Results  Component Value Date   WBC 7.4  09/24/2015   HGB 17.2 (H) 09/24/2015   HCT 50.3 09/24/2015   PLT 159.0 09/24/2015   GLUCOSE 63 (L) 09/24/2015   CHOL 120 09/24/2015   TRIG 85.0 09/24/2015   HDL 57.20 09/24/2015   LDLCALC 45 09/24/2015   ALT 22 09/24/2015   AST 22 09/24/2015   NA 140 09/24/2015   K 4.1 09/24/2015   CL 103 09/24/2015   CREATININE 1.23 09/24/2015   BUN 22 09/24/2015   CO2 34 (H) 09/24/2015   TSH 2.12 09/24/2015   PSA 3.42 09/24/2015   INR 1.0 RATIO 12/11/2008  HGBA1C 5.4 02/11/2016   MICROALBUR 2.5 (H) 09/24/2015    Dg Chest 2 View  Result Date: 03/31/2015 CLINICAL DATA:  Cough, congestion for 3 weeks, former smoking history EXAM: CHEST  2 VIEW COMPARISON:  Dictated report from chest x-ray of 02/22/1999 30 FINDINGS: The lungs are clear and somewhat hyper aerated. Minimal peribronchial thickening is noted. Mediastinal and hilar contours are unremarkable. The heart is within normal limits in size. No bony abnormality is seen. IMPRESSION: No active lung disease.  Minimal peribronchial thickening. Electronically Signed   By: Ivar Drape M.D.   On: 03/31/2015 14:34     Assessment & Plan:  Plan  I am having Mr. Benally start on traMADol. I am also having him maintain his aspirin, Biotin, multivitamin, Fish Oil, CoQ-10, Cinnamon, Ginger Root, NON FORMULARY, celecoxib, Turmeric, magnesium, tamsulosin, simvastatin, metoprolol succinate, metFORMIN, losartan-hydrochlorothiazide, levothyroxine, fluticasone, and ezetimibe.  Meds ordered this encounter  Medications  . traMADol (ULTRAM) 50 MG tablet    Sig: Take 1 tablet (50 mg total) by mouth every 8 (eight) hours as needed.    Dispense:  30 tablet    Refill:  0    Problem List Items Addressed This Visit    None    Visit Diagnoses    Hx of migraines    -  Primary   Relevant Medications   traMADol (ULTRAM) 50 MG tablet   Other Relevant Orders   AMB referral to headache clinic   MR Brain Wo Contrast      Follow-up: No Follow-up on file.  [name], DO

## 2016-08-09 NOTE — Progress Notes (Signed)
Pre visit review using our clinic review tool, if applicable. No additional management support is needed unless otherwise documented below in the visit note. 

## 2016-08-10 ENCOUNTER — Other Ambulatory Visit: Payer: Self-pay | Admitting: Family Medicine

## 2016-08-10 DIAGNOSIS — Z1389 Encounter for screening for other disorder: Secondary | ICD-10-CM

## 2016-08-11 ENCOUNTER — Ambulatory Visit (HOSPITAL_BASED_OUTPATIENT_CLINIC_OR_DEPARTMENT_OTHER)
Admission: RE | Admit: 2016-08-11 | Discharge: 2016-08-11 | Disposition: A | Discharge status: Home / Self Care | Payer: Medicare Other | Source: Ambulatory Visit | Attending: Family Medicine | Admitting: Family Medicine

## 2016-08-11 ENCOUNTER — Encounter: Payer: Self-pay | Admitting: Family Medicine

## 2016-08-11 DIAGNOSIS — Z01818 Encounter for other preprocedural examination: Secondary | ICD-10-CM | POA: Diagnosis not present

## 2016-08-11 DIAGNOSIS — Z1389 Encounter for screening for other disorder: Secondary | ICD-10-CM

## 2016-08-11 DIAGNOSIS — G319 Degenerative disease of nervous system, unspecified: Secondary | ICD-10-CM | POA: Insufficient documentation

## 2016-08-11 DIAGNOSIS — R9082 White matter disease, unspecified: Secondary | ICD-10-CM | POA: Insufficient documentation

## 2016-08-11 DIAGNOSIS — G43809 Other migraine, not intractable, without status migrainosus: Secondary | ICD-10-CM | POA: Insufficient documentation

## 2016-08-11 DIAGNOSIS — E034 Atrophy of thyroid (acquired): Secondary | ICD-10-CM | POA: Diagnosis not present

## 2016-08-11 DIAGNOSIS — E119 Type 2 diabetes mellitus without complications: Secondary | ICD-10-CM | POA: Diagnosis not present

## 2016-08-11 DIAGNOSIS — D696 Thrombocytopenia, unspecified: Secondary | ICD-10-CM | POA: Diagnosis not present

## 2016-08-11 DIAGNOSIS — G43909 Migraine, unspecified, not intractable, without status migrainosus: Secondary | ICD-10-CM | POA: Diagnosis not present

## 2016-08-11 DIAGNOSIS — E789 Disorder of lipoprotein metabolism, unspecified: Secondary | ICD-10-CM | POA: Diagnosis not present

## 2016-08-11 DIAGNOSIS — Z8669 Personal history of other diseases of the nervous system and sense organs: Secondary | ICD-10-CM | POA: Diagnosis not present

## 2016-08-12 ENCOUNTER — Encounter: Payer: Self-pay | Admitting: Family Medicine

## 2016-08-12 MED ORDER — AMOXICILLIN-POT CLAVULANATE 875-125 MG PO TABS: 1.0000 | ORAL_TABLET | Freq: Two times a day (BID) | ORAL | 0 refills | Status: DC

## 2016-08-18 DIAGNOSIS — R51 Headache: Secondary | ICD-10-CM | POA: Diagnosis not present

## 2016-08-18 DIAGNOSIS — E032 Hypothyroidism due to medicaments and other exogenous substances: Secondary | ICD-10-CM | POA: Diagnosis not present

## 2016-08-18 DIAGNOSIS — J329 Chronic sinusitis, unspecified: Secondary | ICD-10-CM | POA: Diagnosis not present

## 2016-08-22 ENCOUNTER — Encounter: Payer: Self-pay | Admitting: Family Medicine

## 2016-08-23 ENCOUNTER — Encounter: Payer: Self-pay | Admitting: Family Medicine

## 2016-08-23 ENCOUNTER — Other Ambulatory Visit: Payer: Self-pay | Admitting: Family Medicine

## 2016-08-23 DIAGNOSIS — G43809 Other migraine, not intractable, without status migrainosus: Secondary | ICD-10-CM

## 2016-08-23 MED ORDER — LEVOFLOXACIN 500 MG PO TABS: 500.0000 mg | ORAL_TABLET | Freq: Every day | ORAL | 0 refills | Status: DC

## 2016-08-23 MED ORDER — SUMATRIPTAN SUCCINATE 50 MG PO TABS: ORAL_TABLET | ORAL | 0 refills | Status: DC

## 2016-08-23 NOTE — Progress Notes (Signed)
See 08/23/16 pt email.

## 2016-08-23 NOTE — Telephone Encounter (Signed)
Attempted to notify pt and left message to check mychart message. Message sent.

## 2016-08-23 NOTE — Telephone Encounter (Signed)
imitrex was sent to pharmacy---  I  Po x 1 , may repeat in 2 hours prn x 1 x only D/c augmentin--- start levaquin 500 mg #10 1 po qd Ov if no better

## 2016-08-30 DIAGNOSIS — L821 Other seborrheic keratosis: Secondary | ICD-10-CM | POA: Diagnosis not present

## 2016-08-30 DIAGNOSIS — D1801 Hemangioma of skin and subcutaneous tissue: Secondary | ICD-10-CM | POA: Diagnosis not present

## 2016-08-30 DIAGNOSIS — D235 Other benign neoplasm of skin of trunk: Secondary | ICD-10-CM | POA: Diagnosis not present

## 2016-08-30 DIAGNOSIS — L578 Other skin changes due to chronic exposure to nonionizing radiation: Secondary | ICD-10-CM | POA: Diagnosis not present

## 2016-08-30 DIAGNOSIS — L853 Xerosis cutis: Secondary | ICD-10-CM | POA: Diagnosis not present

## 2016-09-02 ENCOUNTER — Encounter: Payer: Self-pay | Admitting: Family Medicine

## 2016-09-05 ENCOUNTER — Telehealth: Payer: Self-pay

## 2016-09-05 DIAGNOSIS — Z8669 Personal history of other diseases of the nervous system and sense organs: Secondary | ICD-10-CM

## 2016-09-05 NOTE — Telephone Encounter (Signed)
We should check CT sinuses ltd no contrast  And still be referral in for neuro

## 2016-09-05 NOTE — Telephone Encounter (Signed)
Referral put in for neurology for hx of migraines. LB

## 2016-09-05 NOTE — Telephone Encounter (Signed)
Lets refer to neurology for hx migraines that are worsening

## 2016-09-06 ENCOUNTER — Telehealth: Payer: Self-pay

## 2016-09-06 ENCOUNTER — Other Ambulatory Visit: Payer: Self-pay

## 2016-09-06 DIAGNOSIS — Z8669 Personal history of other diseases of the nervous system and sense organs: Secondary | ICD-10-CM

## 2016-09-06 NOTE — Telephone Encounter (Signed)
CT sinuses entered, per providers request. Pt notified. LB

## 2016-09-06 NOTE — Telephone Encounter (Signed)
Spoke with pt in regards to [employer]. Pt c/o continuous migraines, provider referred pt to neurology and for pt to have a CT sinuses ltd without contrast. Pt states he feels great at this moment and [place], he will do the CT when he returns in a couple days. Pt states he will notify the office if he has any further issues via mychart. LB

## 2016-09-07 ENCOUNTER — Encounter: Payer: Self-pay | Admitting: Family Medicine

## 2016-09-08 ENCOUNTER — Telehealth: Payer: Self-pay | Admitting: Family Medicine

## 2016-09-08 NOTE — Telephone Encounter (Signed)
Called pt, he will have labs completed at next office visit.

## 2016-09-08 NOTE — Telephone Encounter (Signed)
Relation to [id number] Call back number:[phone]   Reason for call:  Patient states PCP advised patient to return to have he's A1C check, patient would like to come in tomorrow 09/08/16, chart doesn't reflect, please advise

## 2016-09-08 NOTE — Telephone Encounter (Signed)
Pt has CPE scheduled 10/06/16. All necessary labs can be completed at that appt. I do not see documentation indicating that pt needs to come in for labs prior to appt.

## 2016-09-08 NOTE — Telephone Encounter (Signed)
Patient would like to speak with nurse directly.

## 2016-09-09 ENCOUNTER — Ambulatory Visit (HOSPITAL_BASED_OUTPATIENT_CLINIC_OR_DEPARTMENT_OTHER)
Admission: RE | Admit: 2016-09-09 | Discharge: 2016-09-09 | Disposition: A | Discharge status: Home / Self Care | Payer: Medicare Other | Source: Ambulatory Visit | Attending: Family Medicine | Admitting: Family Medicine

## 2016-09-09 DIAGNOSIS — Z8669 Personal history of other diseases of the nervous system and sense organs: Secondary | ICD-10-CM

## 2016-09-09 DIAGNOSIS — H7010 Chronic mastoiditis, unspecified ear: Secondary | ICD-10-CM | POA: Diagnosis not present

## 2016-09-09 DIAGNOSIS — J3489 Other specified disorders of nose and nasal sinuses: Secondary | ICD-10-CM | POA: Insufficient documentation

## 2016-09-12 ENCOUNTER — Other Ambulatory Visit: Payer: Self-pay | Admitting: Family Medicine

## 2016-09-12 DIAGNOSIS — H7010 Chronic mastoiditis, unspecified ear: Secondary | ICD-10-CM

## 2016-09-12 MED ORDER — OFLOXACIN 0.3 % OT SOLN: 10.0000 [drp] | Freq: Every day | OTIC | 0 refills | Status: DC

## 2016-09-12 MED ORDER — AMOXICILLIN-POT CLAVULANATE 875-125 MG PO TABS: 1.0000 | ORAL_TABLET | Freq: Two times a day (BID) | ORAL | 0 refills | Status: DC

## 2016-09-13 ENCOUNTER — Telehealth: Payer: Self-pay

## 2016-09-13 DIAGNOSIS — H7090 Unspecified mastoiditis, unspecified ear: Secondary | ICD-10-CM

## 2016-09-13 NOTE — Telephone Encounter (Signed)
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

## 2016-09-13 NOTE — Telephone Encounter (Signed)
Referral for ENT entered, per providers request. LB

## 2016-09-14 ENCOUNTER — Encounter: Payer: Self-pay | Admitting: Family Medicine

## 2016-09-21 DIAGNOSIS — J324 Chronic pansinusitis: Secondary | ICD-10-CM | POA: Diagnosis not present

## 2016-09-21 DIAGNOSIS — H7093 Unspecified mastoiditis, bilateral: Secondary | ICD-10-CM

## 2016-09-21 DIAGNOSIS — H6983 Other specified disorders of Eustachian tube, bilateral: Secondary | ICD-10-CM | POA: Diagnosis not present

## 2016-09-21 DIAGNOSIS — H70003 Acute mastoiditis without complications, bilateral: Secondary | ICD-10-CM | POA: Diagnosis not present

## 2016-09-21 HISTORY — DX: Unspecified mastoiditis, bilateral: H70.93

## 2016-09-21 HISTORY — DX: Chronic pansinusitis: J32.4

## 2016-09-22 DIAGNOSIS — J3089 Other allergic rhinitis: Secondary | ICD-10-CM | POA: Diagnosis not present

## 2016-09-22 DIAGNOSIS — H6983 Other specified disorders of Eustachian tube, bilateral: Secondary | ICD-10-CM | POA: Diagnosis not present

## 2016-09-22 DIAGNOSIS — H70003 Acute mastoiditis without complications, bilateral: Secondary | ICD-10-CM | POA: Diagnosis not present

## 2016-09-22 HISTORY — DX: Other allergic rhinitis: J30.89

## 2016-09-24 ENCOUNTER — Other Ambulatory Visit: Payer: Self-pay | Admitting: Cardiology

## 2016-09-24 DIAGNOSIS — I2510 Atherosclerotic heart disease of native coronary artery without angina pectoris: Secondary | ICD-10-CM

## 2016-09-24 DIAGNOSIS — E785 Hyperlipidemia, unspecified: Secondary | ICD-10-CM

## 2016-09-24 DIAGNOSIS — I10 Essential (primary) hypertension: Secondary | ICD-10-CM

## 2016-09-27 DIAGNOSIS — E119 Type 2 diabetes mellitus without complications: Secondary | ICD-10-CM | POA: Diagnosis not present

## 2016-09-27 DIAGNOSIS — H25813 Combined forms of age-related cataract, bilateral: Secondary | ICD-10-CM | POA: Diagnosis not present

## 2016-09-27 DIAGNOSIS — H52203 Unspecified astigmatism, bilateral: Secondary | ICD-10-CM | POA: Diagnosis not present

## 2016-10-06 ENCOUNTER — Telehealth: Payer: Self-pay | Admitting: *Deleted

## 2016-10-06 ENCOUNTER — Other Ambulatory Visit: Payer: Self-pay | Admitting: Family Medicine

## 2016-10-06 ENCOUNTER — Ambulatory Visit (INDEPENDENT_AMBULATORY_CARE_PROVIDER_SITE_OTHER): Payer: Medicare Other | Admitting: Family Medicine

## 2016-10-06 ENCOUNTER — Encounter: Payer: Self-pay | Admitting: Family Medicine

## 2016-10-06 VITALS — BP 142/80 | HR 57 | Temp 97.6°F | Resp 16 | Ht 70.0 in | Wt 178.4 lb

## 2016-10-06 DIAGNOSIS — I10 Essential (primary) hypertension: Secondary | ICD-10-CM | POA: Diagnosis not present

## 2016-10-06 DIAGNOSIS — M159 Polyosteoarthritis, unspecified: Secondary | ICD-10-CM | POA: Diagnosis not present

## 2016-10-06 DIAGNOSIS — I2510 Atherosclerotic heart disease of native coronary artery without angina pectoris: Secondary | ICD-10-CM | POA: Diagnosis not present

## 2016-10-06 DIAGNOSIS — D582 Other hemoglobinopathies: Secondary | ICD-10-CM

## 2016-10-06 DIAGNOSIS — E1151 Type 2 diabetes mellitus with diabetic peripheral angiopathy without gangrene: Secondary | ICD-10-CM | POA: Diagnosis not present

## 2016-10-06 DIAGNOSIS — E785 Hyperlipidemia, unspecified: Secondary | ICD-10-CM

## 2016-10-06 DIAGNOSIS — E039 Hypothyroidism, unspecified: Secondary | ICD-10-CM | POA: Diagnosis not present

## 2016-10-06 DIAGNOSIS — G43909 Migraine, unspecified, not intractable, without status migrainosus: Secondary | ICD-10-CM | POA: Diagnosis not present

## 2016-10-06 DIAGNOSIS — Z23 Encounter for immunization: Secondary | ICD-10-CM | POA: Diagnosis not present

## 2016-10-06 DIAGNOSIS — E1149 Type 2 diabetes mellitus with other diabetic neurological complication: Secondary | ICD-10-CM

## 2016-10-06 DIAGNOSIS — J301 Allergic rhinitis due to pollen: Secondary | ICD-10-CM

## 2016-10-06 DIAGNOSIS — Z0000 Encounter for general adult medical examination without abnormal findings: Secondary | ICD-10-CM | POA: Insufficient documentation

## 2016-10-06 DIAGNOSIS — R519 Headache, unspecified: Secondary | ICD-10-CM

## 2016-10-06 DIAGNOSIS — R51 Headache: Principal | ICD-10-CM

## 2016-10-06 LAB — POCT URINALYSIS DIPSTICK
Bilirubin, UA: NEGATIVE
Glucose, UA: NEGATIVE
KETONES UA: NEGATIVE
Leukocytes, UA: NEGATIVE
Nitrite, UA: NEGATIVE
PH UA: 6
PROTEIN UA: NEGATIVE
RBC UA: NEGATIVE
SPEC GRAV UA: 1.025
UROBILINOGEN UA: 0.2

## 2016-10-06 LAB — TSH: TSH: 1.85 u[IU]/mL (ref 0.35–4.50)

## 2016-10-06 LAB — COMPREHENSIVE METABOLIC PANEL
ALK PHOS: 62 U/L (ref 39–117)
ALT: 24 U/L (ref 0–53)
AST: 23 U/L (ref 0–37)
Albumin: 4.4 g/dL (ref 3.5–5.2)
BILIRUBIN TOTAL: 0.9 mg/dL (ref 0.2–1.2)
BUN: 19 mg/dL (ref 6–23)
CALCIUM: 9.7 mg/dL (ref 8.4–10.5)
CO2: 34 meq/L — AB (ref 19–32)
CREATININE: 1.17 mg/dL (ref 0.40–1.50)
Chloride: 103 mEq/L (ref 96–112)
GFR: 65 mL/min (ref >60.00)
Glucose, Bld: 108 mg/dL — ABNORMAL HIGH (ref 70–99)
Potassium: 4.5 mEq/L (ref 3.5–5.1)
Sodium: 141 mEq/L (ref 135–145)
TOTAL PROTEIN: 7.3 g/dL (ref 6.0–8.3)

## 2016-10-06 LAB — LIPID PANEL
CHOL/HDL RATIO: 3
CHOLESTEROL: 178 mg/dL (ref 0–200)
HDL: 66.4 mg/dL (ref >39.00)
LDL Cholesterol: 99 mg/dL (ref 0–99)
NonHDL: 111.24
TRIGLYCERIDES: 59 mg/dL (ref 0.0–149.0)
VLDL: 11.8 mg/dL (ref 0.0–40.0)

## 2016-10-06 LAB — HEMOGLOBIN A1C: Hgb A1c MFr Bld: 5 % (ref 4.6–6.5)

## 2016-10-06 LAB — CBC WITH DIFFERENTIAL/PLATELET
BASOS ABS: 0 10*3/uL (ref 0.0–0.1)
Basophils Relative: 0.5 % (ref 0.0–3.0)
EOS PCT: 3.9 % (ref 0.0–5.0)
Eosinophils Absolute: 0.2 10*3/uL (ref 0.0–0.7)
HCT: 52.4 % — ABNORMAL HIGH (ref 39.0–52.0)
Hemoglobin: 18.5 g/dL (ref 13.0–17.0)
LYMPHS PCT: 17.7 % (ref 12.0–46.0)
Lymphs Abs: 1 10*3/uL (ref 0.7–4.0)
MCHC: 35.3 g/dL (ref 30.0–36.0)
MCV: 93 fl (ref 78.0–100.0)
Monocytes Absolute: 0.5 10*3/uL (ref 0.1–1.0)
Monocytes Relative: 8.9 % (ref 3.0–12.0)
NEUTROS ABS: 4.1 10*3/uL (ref 1.4–7.7)
NEUTROS PCT: 69 % (ref 43.0–77.0)
Platelets: 171 10*3/uL (ref 150.0–400.0)
RBC: 5.63 Mil/uL (ref 4.22–5.81)
RDW: 13.1 % (ref 11.5–15.5)
WBC: 5.9 10*3/uL (ref 4.0–10.5)

## 2016-10-06 MED ORDER — SUMATRIPTAN SUCCINATE 50 MG PO TABS: ORAL_TABLET | ORAL | 0 refills | Status: DC

## 2016-10-06 MED ORDER — LOSARTAN POTASSIUM-HCTZ 100-12.5 MG PO TABS: 1.0000 | ORAL_TABLET | Freq: Every day | ORAL | 11 refills | Status: DC

## 2016-10-06 MED ORDER — FLUTICASONE PROPIONATE 50 MCG/ACT NA SUSP: 2.0000 | Freq: Every day | NASAL | 5 refills | Status: DC

## 2016-10-06 MED ORDER — LEVOTHYROXINE SODIUM 112 MCG PO TABS: 112.0000 ug | ORAL_TABLET | Freq: Every day | ORAL | Status: DC

## 2016-10-06 MED ORDER — PIROXICAM 10 MG PO CAPS: ORAL_CAPSULE | ORAL | 2 refills | Status: DC

## 2016-10-06 MED ORDER — SIMVASTATIN 20 MG PO TABS: 20.0000 mg | ORAL_TABLET | Freq: Every day | ORAL | 0 refills | Status: DC

## 2016-10-06 MED ORDER — EZETIMIBE 10 MG PO TABS: 10.0000 mg | ORAL_TABLET | Freq: Every day | ORAL | Status: DC

## 2016-10-06 MED ORDER — CELECOXIB 200 MG PO CAPS: 200.0000 mg | ORAL_CAPSULE | Freq: Every day | ORAL | 3 refills | Status: DC | PRN

## 2016-10-06 MED ORDER — METFORMIN HCL ER (MOD) 500 MG PO TB24: 500.0000 mg | ORAL_TABLET | Freq: Two times a day (BID) | ORAL | Status: DC

## 2016-10-06 MED ORDER — METOPROLOL SUCCINATE ER 100 MG PO TB24: ORAL_TABLET | ORAL | 3 refills | Status: DC

## 2016-10-06 NOTE — Progress Notes (Signed)
rPatient ID: [name], male    DOB: 1946/06/19  Age: 70 y.o. MRN: [id number]    Subjective:  Subjective  HPI [name] presents for cpe --- he has neuro appointment next week.  No other complaints.    Review of Systems  Review of Systems  Constitutional: Negative for activity change, appetite change and fatigue.  HENT: Negative for hearing loss, congestion, tinnitus and ear discharge.   Eyes: Negative for visual disturbance (see optho q1y -- vision corrected to 20/20 with glasses).  Respiratory: Negative for cough, chest tightness and shortness of breath.   Cardiovascular: Negative for chest pain, palpitations and leg swelling.  Gastrointestinal: Negative for abdominal pain, diarrhea, constipation and abdominal distention.  Genitourinary: Negative for urgency, frequency, decreased urine volume and difficulty urinating.  Musculoskeletal: Negative for back pain, arthralgias and gait problem.  Skin: Negative for color change, pallor and rash.  Neurological: Negative for dizziness, light-headedness, numbness and headaches.  Hematological: Negative for adenopathy. Does not bruise/bleed easily.  Psychiatric/Behavioral: Negative for suicidal ideas, confusion, sleep disturbance, self-injury, dysphoric mood, decreased concentration and agitation.  Pt is able to read and write and can do all ADLs No risk for falling No abuse/ violence in home  History Past Medical History:  Diagnosis Date  . Allergy   . Arthritis   . CAD (coronary artery disease)    s/p PTCI in 2003 and 2004 to the LAD.   Marland Kitchen Depression   . Diabetes mellitus   . History of placement of ear tubes   . Hyperlipidemia   . Hypertension   . Hypothyroid   . Internal hemorrhoids   . Migraine headache   . Skin cancer, basal cell   . Thrombocytopenia ([place])   . Thrombocytopenia, unspecified 05/08/2013  . TIA (transient ischemic attack) 2006   per patient's report.  He was never officially given this diagnosis    He  has a past surgical history that includes Hernia repair (2007); fatty tissue removed; Thyroidectomy, partial (07/2002); coronary artery disease status post placement; right toe bone spur surgery; Tonsillectomy; Colonoscopy (2011); and Polypectomy (2011).   His family history includes COPD in his brother; Heart disease in his father and mother; Hypertension in his brother, brother, and brother; Mental illness in his mother; Prostate cancer in his brother.He reports that he quit smoking about 51 years ago. His smoking use included Cigarettes. He quit after 5.00 years of use. He has never used smokeless tobacco. He reports that he drinks alcohol. He reports that he does not use drugs.  Current Outpatient Prescriptions on File Prior to Visit  Medication Sig Dispense Refill  . aspirin 81 MG tablet Take 81 mg by mouth daily.      . Biotin 1000 MCG tablet Take 1,000 mcg by mouth daily.     . Cinnamon 500 MG capsule Take 1,000 mg by mouth 2 (two) times daily.     . Coenzyme Q10 (COQ-10) 100 MG capsule Take 100 mg by mouth 2 (two) times daily.     . Ginger, Zingiber officinalis, (GINGER ROOT) 550 MG CAPS Take 1 capsule by mouth daily.      Marland Kitchen levofloxacin (LEVAQUIN) 500 MG tablet Take 1 tablet (500 mg total) by mouth daily. 10 tablet 0  . magnesium 30 MG tablet Take 30 mg by mouth 2 (two) times daily.    . metoprolol succinate (TOPROL-XL) 100 MG 24 hr tablet Take 1 tablet (100 mg total) by mouth daily. Take with or immediately following a meal.  90 tablet 3  . multivitamin (THERAGRAN) per tablet Take 1 tablet by mouth daily.      . NON FORMULARY Take 1 capsule by mouth daily. ( Tumeric 500 mg )     . Omega-3 Fatty Acids (FISH OIL) 1200 MG CAPS Take 2 capsules by mouth 2 (two) times daily.      . tamsulosin (FLOMAX) 0.4 MG CAPS capsule Take 1 capsule (0.4 mg total) by mouth daily. Reported on 09/23/2015 30 capsule   . traMADol (ULTRAM) 50 MG tablet Take 1 tablet (50 mg total) by mouth every 8 (eight) hours as  needed. 30 tablet 0  . Turmeric 500 MG CAPS Take 1 capsule by mouth daily.    Marland Kitchen ofloxacin (FLOXIN OTIC) 0.3 % otic solution Place 10 drops into both ears daily. (Patient not taking: Reported on 10/06/2016) 10 mL 0   No current facility-administered medications on file prior to visit.      Objective:  Objective  Physical Exam BP (!) 142/80 (BP Location: Right Arm, Patient Position: Sitting, Cuff Size: Normal)   Pulse (!) 57   Temp 97.6 F (36.4 C) (Oral)   Resp 16   Ht 5\' 10"  (1.778 m)   Wt 178 lb 6.4 oz (80.9 kg)   SpO2 97%   BMI 25.60 kg/m  Wt Readings from Last 3 Encounters:  10/06/16 178 lb 6.4 oz (80.9 kg)  08/09/16 178 lb 12.8 oz (81.1 kg)  03/24/16 173 lb 6.4 oz (78.7 kg)   BP (!) 142/80 (BP Location: Right Arm, Patient Position: Sitting, Cuff Size: Normal)   Pulse (!) 57   Temp 97.6 F (36.4 C) (Oral)   Resp 16   Ht 5\' 10"  (1.778 m)   Wt 178 lb 6.4 oz (80.9 kg)   SpO2 97%   BMI 25.60 kg/m  General appearance: alert, cooperative, appears stated age and no distress Head: Normocephalic, without obvious abnormality, atraumatic Eyes: conjunctivae/corneas clear. PERRL, EOM's intact. Fundi benign. Ears: normal TM's and external ear canals both ears Nose: Nares normal. Septum midline. Mucosa normal. No drainage or sinus tenderness. Throat: lips, mucosa, and tongue normal; teeth and gums normal Neck: no adenopathy, no carotid bruit, no JVD, supple, symmetrical, trachea midline and thyroid not enlarged, symmetric, no tenderness/mass/nodules Back: symmetric, no curvature. ROM normal. No CVA tenderness. Lungs: clear to auscultation bilaterally Chest wall: no tenderness Heart: regular rate and rhythm, S1, S2 normal, no murmur, click, rub or gallop Abdomen: soft, non-tender; bowel sounds normal; no masses,  no organomegaly Male genitalia: normal Rectal: normal tone, normal prostate, no masses or tenderness  Heme neg brown stool Extremities: extremities normal, atraumatic, no  cyanosis or edema Pulses: 2+ and symmetric Skin: Skin color, texture, turgor normal. No rashes or lesions Lymph nodes: Cervical, supraclavicular, and axillary nodes normal. Neurologic: Alert and oriented X 3, normal strength and tone. Normal symmetric reflexes. Normal coordination and gait  Lab Results  Component Value Date   WBC 7.4 09/24/2015   HGB 17.2 (H) 09/24/2015   HCT 50.3 09/24/2015   PLT 159.0 09/24/2015   GLUCOSE 63 (L) 09/24/2015   CHOL 120 09/24/2015   TRIG 85.0 09/24/2015   HDL 57.20 09/24/2015   LDLCALC 45 09/24/2015   ALT 22 09/24/2015   AST 22 09/24/2015   NA 140 09/24/2015   K 4.1 09/24/2015   CL 103 09/24/2015   CREATININE 1.23 09/24/2015   BUN 22 09/24/2015   CO2 34 (H) 09/24/2015   TSH 2.12 09/24/2015   PSA 3.42 09/24/2015  INR 1.0 RATIO 12/11/2008   HGBA1C 5.4 02/11/2016   MICROALBUR 2.5 (H) 09/24/2015    Ct Maxillofacial Wo Cm  Result Date: 09/09/2016 CLINICAL DATA:  History of migraine. Fluid in mastoids on recent MRI. History of recent antibiotics. EXAM: CT MAXILLOFACIAL WITHOUT CONTRAST TECHNIQUE: Multidetector CT imaging of the maxillofacial structures was performed. Multiplanar CT image reconstructions were also generated. A small metallic BB was placed on the right temple in order to reliably differentiate right from left. COMPARISON:  Brain MRI 08/11/2016 FINDINGS: Bilateral right more than left opacification of mastoid air cells, small volume. There is bony sclerosis in the right mastoid tip. Extent is unchanged from prior, despite history of interval antibiotics. No coalescent changes or erosion. Clear middle ears. Negative nasopharynx. Osseous: No fracture or mandibular dislocation. No destructive process. Orbits: Unremarkable Sinuses: Minor patchy mucosal thickening in the paranasal sinuses without fluid level or obstruction. Maxillary infundibula, frontal ethmoidal recesses, and sphenoid ethmoidal recesses are patent. Paradoxical rotation of the  right middle turbinate without nasal cavity stenosis. Small leftward nasal septal spur contacting the inferior turbinate. Soft tissues: Negative Limited intracranial: Negative IMPRESSION: 1. 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 No erosive changes or nasopharyngeal obstruction. 2. Minimal mucosal thickening in the paranasal sinuses without obstruction. Electronically Signed   By: Monte Fantasia M.D.   On: 09/09/2016 09:27     Assessment & Plan:  Plan  I have discontinued Mr. Lamers celecoxib, amoxicillin-clavulanate, and celecoxib. I am also having him start on piroxicam. Additionally, I am having him maintain his aspirin, Biotin, multivitamin, Fish Oil, CoQ-10, Cinnamon, Ginger Root, NON FORMULARY, Turmeric, magnesium, tamsulosin, metoprolol succinate, traMADol, levofloxacin, ofloxacin, SUMAtriptan, fluticasone, metoprolol succinate, losartan-hydrochlorothiazide, ezetimibe, levothyroxine, simvastatin, and metFORMIN.  Meds ordered this encounter  Medications  . DISCONTD: celecoxib (CELEBREX) 200 MG capsule    Sig: Take 1 capsule (200 mg total) by mouth daily as needed. Reported on 09/23/2015    Dispense:  90 capsule    Refill:  3  . SUMAtriptan (IMITREX) 50 MG tablet    Sig: Take 1 tablet at onset of headache. May repeat in 2 hours if headache persists or recurs.    Dispense:  10 tablet    Refill:  0  . fluticasone (FLONASE) 50 MCG/ACT nasal spray    Sig: Place 2 sprays into both nostrils daily.    Dispense:  16 g    Refill:  5  . piroxicam (FELDENE) 10 MG capsule    Sig: 1-2 daily prn    Dispense:  60 capsule    Refill:  2  . metoprolol succinate (TOPROL-XL) 100 MG 24 hr tablet    Sig: TAKE 1 TABLET DAILY. TAKE WITH OR IMMEDIATELY FOLLOWING A MEAL    Dispense:  90 tablet    Refill:  3    **Patient requests 90 days supply**  . losartan-hydrochlorothiazide (HYZAAR) 100-12.5 MG tablet    Sig: Take 1 tablet by mouth daily.    Dispense:  30 tablet    Refill:   11  . ezetimibe (ZETIA) 10 MG tablet    Sig: Take 1 tablet (10 mg total) by mouth daily.  Marland Kitchen levothyroxine (SYNTHROID, LEVOTHROID) 112 MCG tablet    Sig: Take 1 tablet (112 mcg total) by mouth daily.  . simvastatin (ZOCOR) 20 MG tablet    Sig: Take 1 tablet (20 mg total) by mouth daily.    Dispense:  30 tablet    Refill:  0  . metFORMIN (GLUMETZA) 500 MG (MOD)  24 hr tablet    Sig: Take 1 tablet (500 mg total) by mouth 2 (two) times daily with a meal.    Problem List Items Addressed This Visit      Unprioritized   Hyperlipidemia   Relevant Medications   metoprolol succinate (TOPROL-XL) 100 MG 24 hr tablet   losartan-hydrochlorothiazide (HYZAAR) 100-12.5 MG tablet   ezetimibe (ZETIA) 10 MG tablet   simvastatin (ZOCOR) 20 MG tablet   Hypothyroidism   Relevant Medications   metoprolol succinate (TOPROL-XL) 100 MG 24 hr tablet   levothyroxine (SYNTHROID, LEVOTHROID) 112 MCG tablet   Other Relevant Orders   TSH   CAD, NATIVE VESSEL    Per cardiology      Relevant Medications   metoprolol succinate (TOPROL-XL) 100 MG 24 hr tablet   losartan-hydrochlorothiazide (HYZAAR) 100-12.5 MG tablet   ezetimibe (ZETIA) 10 MG tablet   simvastatin (ZOCOR) 20 MG tablet   DM (diabetes mellitus) type II controlled peripheral vascular disorder (HCC)    Endo retiring --- check labs today      Relevant Medications   metoprolol succinate (TOPROL-XL) 100 MG 24 hr tablet   losartan-hydrochlorothiazide (HYZAAR) 100-12.5 MG tablet   ezetimibe (ZETIA) 10 MG tablet   simvastatin (ZOCOR) 20 MG tablet   metFORMIN (GLUMETZA) 500 MG (MOD) 24 hr tablet   Preventative health care    See AVS ghm utd Check labs F/u uro, cardiology, derm        Other Visit Diagnoses    Osteoarthritis of multiple joints, unspecified osteoarthritis type    -  Primary   Relevant Medications   piroxicam (FELDENE) 10 MG capsule   Migraine without status migrainosus, not intractable, unspecified migraine type        Relevant Medications   SUMAtriptan (IMITREX) 50 MG tablet   piroxicam (FELDENE) 10 MG capsule   metoprolol succinate (TOPROL-XL) 100 MG 24 hr tablet   losartan-hydrochlorothiazide (HYZAAR) 100-12.5 MG tablet   ezetimibe (ZETIA) 10 MG tablet   simvastatin (ZOCOR) 20 MG tablet   Chronic seasonal allergic rhinitis due to pollen       Relevant Medications   fluticasone (FLONASE) 50 MCG/ACT nasal spray   Essential hypertension       Relevant Medications   metoprolol succinate (TOPROL-XL) 100 MG 24 hr tablet   losartan-hydrochlorothiazide (HYZAAR) 100-12.5 MG tablet   ezetimibe (ZETIA) 10 MG tablet   simvastatin (ZOCOR) 20 MG tablet   Other Relevant Orders   Comprehensive metabolic panel   Lipid panel   CBC with Differential/Platelet   POCT urinalysis dipstick (Completed)   TSH   Hemoglobin A1c   Hyperlipidemia LDL goal <70       Relevant Medications   metoprolol succinate (TOPROL-XL) 100 MG 24 hr tablet   losartan-hydrochlorothiazide (HYZAAR) 100-12.5 MG tablet   ezetimibe (ZETIA) 10 MG tablet   simvastatin (ZOCOR) 20 MG tablet   Other Relevant Orders   Comprehensive metabolic panel   Lipid panel   CBC with Differential/Platelet   POCT urinalysis dipstick (Completed)   TSH   Hemoglobin A1c   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, without long-term current use of insulin (HCC)       Relevant Medications   losartan-hydrochlorothiazide (HYZAAR) 100-12.5 MG tablet   simvastatin (ZOCOR) 20 MG tablet   metFORMIN (GLUMETZA) 500 MG (MOD) 24 hr tablet   Other Relevant Orders   Comprehensive metabolic panel   Lipid panel   POCT urinalysis dipstick (Completed)   Hemoglobin A1c   Need for  prophylactic vaccination and inoculation against influenza       Relevant Orders   Flu vaccine HIGH DOSE PF (Fluzone High dose) (Completed)    b  Follow-up: Return in about 6 months (around 04/06/2017) for hypertension, hyperlipidemia, diabetes II.  [name], DO

## 2016-10-06 NOTE — Assessment & Plan Note (Signed)
See AVS ghm utd Check labs F/u uro, cardiology, derm

## 2016-10-06 NOTE — Progress Notes (Signed)
Pre visit review using our clinic review tool, if applicable. No additional management support is needed unless otherwise documented below in the visit note. 

## 2016-10-06 NOTE — Patient Instructions (Signed)
Preventive Care 72 Years and Older, Male Preventive care refers to lifestyle choices and visits with your health care provider that can promote health and wellness. What does preventive care include?  A yearly physical exam. This is also called an annual well check.  Dental exams once or twice a year.  Routine eye exams. Ask your health care provider how often you should have your eyes checked.  Personal lifestyle choices, including:  Daily care of your teeth and gums.  Regular physical activity.  Eating a healthy diet.  Avoiding tobacco and drug use.  Limiting alcohol use.  Practicing safe sex.  Taking low doses of aspirin every day.  Taking vitamin and mineral supplements as recommended by your health care provider. What happens during an annual well check? The services and screenings done by your health care provider during your annual well check will depend on your age, overall health, lifestyle risk factors, and family history of disease. Counseling  Your health care provider may ask you questions about your:  Alcohol use.  Tobacco use.  Drug use.  Emotional well-being.  Home and relationship well-being.  Sexual activity.  Eating habits.  History of falls.  Memory and ability to understand (cognition).  Work and work environment. Screening  You may have the following tests or measurements:  Height, weight, and BMI.  Blood pressure.  Lipid and cholesterol levels. These may be checked every 5 years, or more frequently if you are over 50 years old.  Skin check.  Lung cancer screening. You may have this screening every year starting at age 55 if you have a 30-pack-year history of smoking and currently smoke or have quit within the past 15 years.  Fecal occult blood test (FOBT) of the stool. You may have this test every year starting at age 50.  Flexible sigmoidoscopy or colonoscopy. You may have a sigmoidoscopy every 5 years or a colonoscopy every 10  years starting at age 50.  Prostate cancer screening. Recommendations will vary depending on your family history and other risks.  Hepatitis C blood test.  Hepatitis B blood test.  Sexually transmitted disease (STD) testing.  Diabetes screening. This is done by checking your blood sugar (glucose) after you have not eaten for a while (fasting). You may have this done every 1-3 years.  Abdominal aortic aneurysm (AAA) screening. You may need this if you are a current or former smoker.  Osteoporosis. You may be screened starting at age 70 if you are at high risk. Talk with your health care provider about your test results, treatment options, and if necessary, the need for more tests. Vaccines  Your health care provider may recommend certain vaccines, such as:  Influenza vaccine. This is recommended every year.  Tetanus, diphtheria, and acellular pertussis (Tdap, Td) vaccine. You may need a Td booster every 10 years.  Varicella vaccine. You may need this if you have not been vaccinated.  Zoster vaccine. You may need this after age 60.  Measles, mumps, and rubella (MMR) vaccine. You may need at least one dose of MMR if you were born in 1957 or later. You may also need a second dose.  Pneumococcal 13-valent conjugate (PCV13) vaccine. One dose is recommended after age 65.  Pneumococcal polysaccharide (PPSV23) vaccine. One dose is recommended after age 65.  Meningococcal vaccine. You may need this if you have certain conditions.  Hepatitis A vaccine. You may need this if you have certain conditions or if you travel or work in places where   you may be exposed to hepatitis A.  Hepatitis B vaccine. You may need this if you have certain conditions or if you travel or work in places where you may be exposed to hepatitis B.  Haemophilus influenzae type b (Hib) vaccine. You may need this if you have certain risk factors. Talk to your health care provider about which screenings and vaccines you  need and how often you need them. This information is not intended to replace advice given to you by your health care provider. Make sure you discuss any questions you have with your health care provider. Document Released: 10/23/2015 Document Revised: 06/15/2016 Document Reviewed: 07/28/2015 Elsevier Interactive Patient Education  2017 [employer].

## 2016-10-06 NOTE — Assessment & Plan Note (Signed)
Endo retiring --- check labs today

## 2016-10-06 NOTE — Assessment & Plan Note (Signed)
Per cardiology 

## 2016-10-06 NOTE — Telephone Encounter (Signed)
This may be why his headaches are worse----refer back to hematology

## 2016-10-06 NOTE — Telephone Encounter (Signed)
Received critical lab results from [facility].  Hgb:18.5.   Critical results given to Dr. Etter Sjogren verbally.//AB/CMA

## 2016-10-07 NOTE — Telephone Encounter (Signed)
Referral for hematology entered. LB

## 2016-10-07 NOTE — Telephone Encounter (Signed)
Left a message on pt's vm to give the office a call back, in regards to a critical lab value, provider referring pt back to Hematology. Lab value Hgb 18.5 this may be the reason pt is experiencing headaches, per provider. LB

## 2016-10-07 NOTE — Telephone Encounter (Signed)
Patient returned your call.

## 2016-10-12 ENCOUNTER — Ambulatory Visit (INDEPENDENT_AMBULATORY_CARE_PROVIDER_SITE_OTHER): Payer: Medicare Other | Admitting: Neurology

## 2016-10-12 ENCOUNTER — Encounter: Payer: Self-pay | Admitting: Neurology

## 2016-10-12 VITALS — BP 154/96 | HR 89 | Ht 69.0 in | Wt 178.9 lb

## 2016-10-12 DIAGNOSIS — G250 Essential tremor: Secondary | ICD-10-CM | POA: Diagnosis not present

## 2016-10-12 DIAGNOSIS — I10 Essential (primary) hypertension: Secondary | ICD-10-CM

## 2016-10-12 DIAGNOSIS — G43109 Migraine with aura, not intractable, without status migrainosus: Secondary | ICD-10-CM

## 2016-10-12 NOTE — Patient Instructions (Signed)
Migraine Recommendations: 1.  I will contact Dr. Stanford Breed to see if we can switch from metoprolol to propranolol.  If not, we will start topiramate. 2.  Take 1 tramadol with 1 Tylenol at earliest onset of headache.  DO NOT TAKE SUMATRIPTAN 3.  Limit use of pain relievers to no more than 2 days out of the week.  These medications include acetaminophen, ibuprofen, triptans and narcotics.  This will help reduce risk of rebound headaches. 4.  Be aware of common food triggers such as processed sweets, processed foods with nitrites (such as deli meat, hot dogs, sausages), foods with MSG, alcohol (such as wine), chocolate, certain cheeses, certain fruits (dried fruits, some citrus fruit), vinegar, diet soda. 4.  Avoid caffeine 5.  Routine exercise 6.  Proper sleep hygiene 7.  Stay adequately hydrated with water 8.  Keep a headache diary. 9.  Maintain proper stress management. 10.  Do not skip meals. 11.  Consider supplements:  Magnesium citrate 400mg  to 600mg  daily, riboflavin 400mg , Coenzyme Q 10 100mg  three times daily 12.  Follow up in 3 months.

## 2016-10-12 NOTE — Progress Notes (Addendum)
NEUROLOGY CONSULTATION NOTE  [name] MRN: [id number] DOB: 1946-06-08  Referring provider: Dr. Etter Sjogren Primary care provider: Dr. Etter Sjogren  Reason for consult:  migraine  HISTORY OF PRESENT ILLNESS: [name] is a 71 year old right-handed male with osteoarthritis, hypertension, type 2 diabetes and hyperlipidemia who presents for migraines and tremor.  He is accompanied by his wife who supplements history such as past medications.  Onset:  40-50 years ago.  They would occur once or twice a year.  In September, they started to occur once or twice a week and then almost daily.  Over the past couple of weeks (following tympanostomy tube placement), they have occurred about once a week. Location:  Top of head Quality:  explode Intensity:  10/10 Aura:  Flashing lights/colors in vision Prodrome:  no Associated symptoms:  Nausea, photophobia, phonophobia Duration:  Several hours Frequency:   In September, they started to occur once or twice a week and then almost daily.  Over the past couple of weeks (following tympanostomy tube placement), they have occurred about once a week. Triggers/exacerbating factors:  Stress, sound, NTG, light Relieving factors:  sleep Activity:  Needs to lay down  Past NSAIDS:  Celebrex Past analgesics:  no Past abortive triptans:  no Past muscle relaxants:  no Past anti-emetic:  no Past antihypertensive medications:  Propranolol (many years ago) Past antidepressant medications:  Prozac, amitriptyline Past anticonvulsant medications:  no Past vitamins/Herbal/Supplements:  no Other past therapies:  Biofeedback, relaxation therapy  Current NSAIDS:  piramxicam daily (for arthritis but seems to help headache) Current analgesics:  Tramadol (makes him sleep but he feels "drunk" for 2 days after migraine) Current triptans:  sumatriptan 50mg  Current anti-emetic:  no Current muscle relaxants:  no Current anti-anxiolytic:  no Current sleep aide:  no Current  Antihypertensive medications:  losartain-HCTZ, Toprol-XL 100mg  Current Antidepressant medications:  no Current Anticonvulsant medications:  no Current Vitamins/Herbal/Supplements:  Biotin, MVI, fish oil, coenzyme Q10, cinnamon, ginger root, turmeric, magnesium, riboflavin Current Antihistamines/Decongestants:  Flonase Other therapy:  no  Caffeine:  2-3 cups coffee daily Alcohol:  no Smoker:  no Diet:  Hydrates.  No soda Exercise:  yes Depression/stress:  stable Sleep hygiene:  okay  MRI of brain without contrast from 08/11/16 was personally reviewed and revealed mild chronic small vessel ischemic changes but no acute stroke, bleed or mass lesion.  However, it did reveal mild mastoiditis.  He saw ENT and had tympanostomy tube placement on 09/22/16.    For over 10 years, he has had a tremor, particularly in his right hand.  It has become more noticeable.  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  There is no known family history.  Labs 10/06/16:  CBC with WBC 5.9, HGB 18.5, HCT 52.4, PLT 171; CMP with Na 141, K 4.5, glucose 108, BUN 19, Cr 1.17, total bili 0.9, ALP 62, AST 23 and ALT 24.  PAST MEDICAL HISTORY: Past Medical History:  Diagnosis Date  . Allergy   . Arthritis   . CAD (coronary artery disease)    s/p PTCI in 2003 and 2004 to the LAD.   Marland Kitchen Depression   . Diabetes mellitus   . History of placement of ear tubes   . Hyperlipidemia   . Hypertension   . Hypothyroid   . Internal hemorrhoids   . Migraine headache   . Skin cancer, basal cell   . Thrombocytopenia ([place])   . Thrombocytopenia, unspecified 05/08/2013  . TIA (transient  ischemic attack) 2006   per patient's report.  He was never officially given this diagnosis    PAST SURGICAL HISTORY: Past Surgical History:  Procedure Laterality Date  . COLONOSCOPY  2011  . coronary artery disease status post placement     of drug-eluting stent in the LAD in 2003,eEF 65% then  . fatty tissue removed       from neck 2003  . HERNIA REPAIR  2007  . POLYPECTOMY  2011   +TA  . right toe bone spur surgery    . THYROIDECTOMY, PARTIAL  07/2002   right thyroid  . TONSILLECTOMY      MEDICATIONS: Current Outpatient Prescriptions on File Prior to Visit  Medication Sig Dispense Refill  . aspirin 81 MG tablet Take 81 mg by mouth daily.      . Biotin 1000 MCG tablet Take 1,000 mcg by mouth daily.     . Cinnamon 500 MG capsule Take 1,000 mg by mouth 2 (two) times daily.     . Coenzyme Q10 (COQ-10) 100 MG capsule Take 100 mg by mouth 2 (two) times daily.     Marland Kitchen ezetimibe (ZETIA) 10 MG tablet Take 1 tablet (10 mg total) by mouth daily.    . fluticasone (FLONASE) 50 MCG/ACT nasal spray Place 2 sprays into both nostrils daily. 16 g 5  . Ginger, Zingiber officinalis, (GINGER ROOT) 550 MG CAPS Take 1 capsule by mouth daily.      Marland Kitchen levothyroxine (SYNTHROID, LEVOTHROID) 112 MCG tablet Take 1 tablet (112 mcg total) by mouth daily.    Marland Kitchen losartan-hydrochlorothiazide (HYZAAR) 100-12.5 MG tablet Take 1 tablet by mouth daily. 30 tablet 11  . magnesium 30 MG tablet Take 30 mg by mouth 2 (two) times daily.    . metFORMIN (GLUMETZA) 500 MG (MOD) 24 hr tablet Take 1 tablet (500 mg total) by mouth 2 (two) times daily with a meal.    . metoprolol succinate (TOPROL-XL) 100 MG 24 hr tablet Take 1 tablet (100 mg total) by mouth daily. Take with or immediately following a meal. 90 tablet 3  . multivitamin (THERAGRAN) per tablet Take 1 tablet by mouth daily.      . Omega-3 Fatty Acids (FISH OIL) 1200 MG CAPS Take 2 capsules by mouth 2 (two) times daily.      . piroxicam (FELDENE) 10 MG capsule 1-2 daily prn 60 capsule 2  . simvastatin (ZOCOR) 20 MG tablet Take 1 tablet (20 mg total) by mouth daily. 30 tablet 0  . SUMAtriptan (IMITREX) 50 MG tablet Take 1 tablet at onset of headache. May repeat in 2 hours if headache persists or recurs. 10 tablet 0  . tamsulosin (FLOMAX) 0.4 MG CAPS capsule Take 1 capsule (0.4 mg total) by  mouth daily. Reported on 09/23/2015 30 capsule   . traMADol (ULTRAM) 50 MG tablet Take 1 tablet (50 mg total) by mouth every 8 (eight) hours as needed. 30 tablet 0  . Turmeric 500 MG CAPS Take 1 capsule by mouth daily.    Marland Kitchen levofloxacin (LEVAQUIN) 500 MG tablet Take 1 tablet (500 mg total) by mouth daily. (Patient not taking: Reported on 10/12/2016) 10 tablet 0   No current facility-administered medications on file prior to visit.     ALLERGIES: Allergies  Allergen Reactions  . Codeine   . Crestor [Rosuvastatin Calcium]   . Hydrocodone-Acetaminophen     vicodin   . Other Nausea Only    Apples and tomatoes   . Rosuvastatin  REACTION: REALLY BAD JOINT/MUSCLE PAIN    FAMILY HISTORY: Family History  Problem Relation Age of Onset  . Mental illness Mother     alzheimers  . Heart disease Mother   . Heart disease Father     triple by pass  . Hypertension Brother     Prostate Cancer  . Prostate cancer Brother   . Hypertension Brother   . Hypertension Brother   . COPD Brother   . Colon cancer Neg Hx   . Colon polyps Neg Hx   . Rectal cancer Neg Hx   . Stomach cancer Neg Hx     SOCIAL HISTORY: Social History   Social History  . Marital status: Married    Spouse name: N/A  . Number of children: 0  . Years of education: N/A   Occupational History  . retired Retired    Optometrist   Social History Main Topics  . Smoking status: Former Smoker    Years: 5.00    Types: Cigarettes    Quit date: 10/10/1965  . Smokeless tobacco: Never Used  . Alcohol use 0.0 oz/week     Comment: monthly on a social basis   . Drug use: No  . Sexual activity: Yes    Partners: Female   Other Topics Concern  . Not on file   Social History Narrative   Exercise-- treadmill, stationary bike       REVIEW OF SYSTEMS: Constitutional: No fevers, chills, or sweats, no generalized fatigue, change in appetite Eyes: No visual changes, double vision, eye pain Ear, nose and throat: No hearing  loss, ear pain, nasal congestion, sore throat Cardiovascular: No chest pain, palpitations Respiratory:  No shortness of breath at rest or with exertion, wheezes GastrointestinaI: No nausea, vomiting, diarrhea, abdominal pain, fecal incontinence Genitourinary:  No dysuria, urinary retention or frequency Musculoskeletal:  No neck pain, back pain Integumentary: No rash, pruritus, skin lesions Neurological: as above Psychiatric: No depression, insomnia, anxiety Endocrine: No palpitations, fatigue, diaphoresis, mood swings, change in appetite, change in weight, increased thirst Hematologic/Lymphatic:  No purpura, petechiae. Allergic/Immunologic: no itchy/runny eyes, nasal congestion, recent allergic reactions, rashes  PHYSICAL EXAM: Vitals:   10/12/16 1500  BP: (!) 154/96  Pulse: 89   General: No acute distress.  Patient appears well-groomed.  Head:  Normocephalic/atraumatic Eyes:  fundi examined but not visualized Neck: supple, no paraspinal tenderness, full range of motion Back: No paraspinal tenderness Heart: regular rate and rhythm Lungs: Clear to auscultation bilaterally. Vascular: No carotid bruits. Neurological Exam: Mental status: alert and oriented to person, place, and time, recent and remote memory intact, fund of knowledge intact, attention and concentration intact, speech fluent and not dysarthric, language intact. Cranial nerves: CN I: not tested CN II: pupils equal, round and reactive to light, visual fields intact CN III, IV, VI:  full range of motion, no nystagmus, no ptosis CN V: facial sensation intact CN VII: upper and lower face symmetric CN VIII: hearing intact CN IX, X: gag intact, uvula midline CN XI: sternocleidomastoid and trapezius muscles intact CN XII: tongue midline Bulk & Tone: normal, no fasciculations. Motor:  5/5 throughout.  No bradykinesia or cogwheel rigidity Sensation: temperature and vibration sensation intact. Deep Tendon Reflexes:  2+  throughout,  toes downgoing.  Finger to nose testing:  Without dysmetria.  Fine kinetic tremor on right. Heel to shin:  Without dysmetria.  Gait:  Normal station and stride.  Able to turn and tandem walk. Romberg negative.  IMPRESSION: Migraine with aura Essential  tremor HTN  PLAN: 1.  I will contact his cardiologist, Dr. Stanford Breed, to see if we can switch metoprolol to propranolol, as this medication will effectively treat migraine, tremor as well as blood pressure.  If this is not an option, then we will start topiramate to treat both migraine and tremor.ADDENDUM:  As per Dr. Stanford Breed, we can discontinue Toprol and start propranolol 80mg  twice daily. 2.  For abortive therapy, will have him take Tylenol with dose of tramadol.  Given his CAD, I advised that he stop taking triptan medication. 3.  Follow up in 3 months. 4.  Follow up with PCP or cardiologist regarding blood pressure.  Thank you for allowing me to take part in the care of this patient.  Metta Clines, DO  CC:  Garnet Koyanagi, DO  Kirk Ruths, MD

## 2016-10-12 NOTE — Telephone Encounter (Signed)
Patient notified about results.  He has an appt with neurologist today.

## 2016-10-13 MED ORDER — PROPRANOLOL HCL 80 MG PO TABS: 80.0000 mg | ORAL_TABLET | Freq: Two times a day (BID) | ORAL | 2 refills | Status: DC

## 2016-10-13 NOTE — Addendum Note (Signed)
Addended byTomi Likens, ADAM R on: 10/13/2016 11:03 AM   Modules accepted: Orders

## 2016-10-14 ENCOUNTER — Telehealth: Payer: Self-pay | Admitting: Neurology

## 2016-10-14 ENCOUNTER — Encounter: Payer: Self-pay | Admitting: Oncology

## 2016-10-14 ENCOUNTER — Telehealth: Payer: Self-pay | Admitting: Oncology

## 2016-10-14 NOTE — Telephone Encounter (Signed)
[name] August 19, 2046. His wife Emannuel Dicicco called to say he has a really bad headache right now. He has already taken some things and she wanted to see what else to do? Her # is [phone]. Thank you

## 2016-10-14 NOTE — Telephone Encounter (Signed)
Spouse states patient woke up w/ Migraine. She was not at home but pt reports he took:  1/2 tab tramadol and 1/2 tab acetaminophen, & an alka-seltzer for HA. Pain has not subsided. Spouse not aware of pain per pain scale. Went over last OV instructions med instructions as well as Dr. Tomi Likens and Dr. Jacalyn Lefevre med instructions. Advised if patient has taken tramadol and acetaminophen w/ no relief then probably would have to go to ER b/c unable to provide urgent services in office. Spouse agreed and verbalized understanding.

## 2016-10-14 NOTE — Telephone Encounter (Signed)
Pt confirmed appt, verified demo and insurance, mailed pt letter, in basket referring provider appt date/time. °

## 2016-10-16 ENCOUNTER — Encounter: Payer: Self-pay | Admitting: Neurology

## 2016-10-31 DIAGNOSIS — J324 Chronic pansinusitis: Secondary | ICD-10-CM | POA: Diagnosis not present

## 2016-10-31 DIAGNOSIS — H7093 Unspecified mastoiditis, bilateral: Secondary | ICD-10-CM | POA: Diagnosis not present

## 2016-10-31 DIAGNOSIS — H6983 Other specified disorders of Eustachian tube, bilateral: Secondary | ICD-10-CM | POA: Diagnosis not present

## 2016-11-07 ENCOUNTER — Telehealth: Payer: Self-pay | Admitting: *Deleted

## 2016-11-07 NOTE — Telephone Encounter (Signed)
This RN called patient and reminded him of his appointment tomorrow with Dr. Alen Blew. Patient verbalized understanding.

## 2016-11-08 ENCOUNTER — Ambulatory Visit (HOSPITAL_BASED_OUTPATIENT_CLINIC_OR_DEPARTMENT_OTHER): Payer: Medicare Other | Admitting: Oncology

## 2016-11-08 ENCOUNTER — Telehealth: Payer: Self-pay | Admitting: Oncology

## 2016-11-08 VITALS — BP 158/76 | HR 61 | Temp 97.6°F | Resp 18 | Ht 69.0 in | Wt 181.3 lb

## 2016-11-08 DIAGNOSIS — D751 Secondary polycythemia: Secondary | ICD-10-CM

## 2016-11-08 NOTE — Telephone Encounter (Signed)
Appointments scheduled per 1/30 LOS. Patient given AVS report and calendars with future scheduled appointments. °

## 2016-11-08 NOTE — Progress Notes (Signed)
Reason for Referral: Polycythemia.   HPI: 71 year old gentleman currently of [place] where he lived the majority of his life. He is a pleasant gentleman with history of coronary artery disease, diabetes among other conditions. He did develop transient thrombocytopenia and was seen by Dr. Lamonte Sakai in 2014 and that issue has resolved. He was then noted to have increased hemoglobin and the last 2 years or so. His hemoglobin has ranged between 17.5 and normal range. His most recent hemoglobin on 10/06/2016 was 18.5. He had normal white cell count and platelet count. His MCV was normal at 93.0. He did report to increase in his migraine headaches although his antihypertensive medication has been changed which have helped his symptoms. He denied any chest pain or angina. He denied any altered mental status. He denied any erythema or induration. He denied any pruritus. He denied any constitutional symptoms. He is spending more time working in his garage and has been exposed to paint fumes. He has denied any higher altitude travel or hormone supplements. His wife does report snoring but unclear if he had had sleep apnea. He does use diuretic associated with his antihypertensive medication.  He does not report any blurry vision, syncope or seizures. He does not report any fevers, chills, sweats or weight loss. He does not report any chest pain, palpitation, orthopnea or leg edema. He does not report any cough, wheezing or hemoptysis. He does not report any nausea, vomiting or abdominal pain. He does not report any frequency urgency or hesitancy. Remaining review of systems unremarkable.   Past Medical History:  Diagnosis Date  . Allergy   . Arthritis   . CAD (coronary artery disease)    s/p PTCI in 2003 and 2004 to the LAD.   Marland Kitchen Depression   . Diabetes mellitus   . History of placement of ear tubes   . Hyperlipidemia   . Hypertension   . Hypothyroid   . Internal hemorrhoids   . Migraine headache   . Skin  cancer, basal cell   . Thrombocytopenia ([place])   . Thrombocytopenia, unspecified 05/08/2013  . TIA (transient ischemic attack) 2006   per patient's report.  He was never officially given this diagnosis  :  Past Surgical History:  Procedure Laterality Date  . COLONOSCOPY  2011  . coronary artery disease status post placement     of drug-eluting stent in the LAD in 2003,eEF 65% then  . fatty tissue removed     from neck 2003  . HERNIA REPAIR  2007  . POLYPECTOMY  2011   +TA  . right toe bone spur surgery    . THYROIDECTOMY, PARTIAL  07/2002   right thyroid  . TONSILLECTOMY    :   Current Outpatient Prescriptions:  .  aspirin 81 MG tablet, Take 81 mg by mouth daily.  , Disp: , Rfl:  .  Biotin 1000 MCG tablet, Take 1,000 mcg by mouth daily. , Disp: , Rfl:  .  Cinnamon 500 MG capsule, Take 1,000 mg by mouth 2 (two) times daily. , Disp: , Rfl:  .  Coenzyme Q10 (COQ-10) 100 MG capsule, Take 100 mg by mouth 2 (two) times daily. , Disp: , Rfl:  .  ezetimibe (ZETIA) 10 MG tablet, Take 1 tablet (10 mg total) by mouth daily., Disp: , Rfl:  .  FEVERFEW PO, Take 1 capsule by mouth 3 (three) times daily., Disp: , Rfl:  .  fluticasone (FLONASE) 50 MCG/ACT nasal spray, Place 2 sprays into both nostrils  daily., Disp: 16 g, Rfl: 5 .  Ginger, Zingiber officinalis, (GINGER ROOT) 550 MG CAPS, Take 1 capsule by mouth daily.  , Disp: , Rfl:  .  levothyroxine (SYNTHROID, LEVOTHROID) 112 MCG tablet, Take 1 tablet (112 mcg total) by mouth daily., Disp: , Rfl:  .  losartan-hydrochlorothiazide (HYZAAR) 100-12.5 MG tablet, Take 1 tablet by mouth daily., Disp: 30 tablet, Rfl: 11 .  magnesium 30 MG tablet, Take 30 mg by mouth 2 (two) times daily., Disp: , Rfl:  .  metFORMIN (GLUMETZA) 500 MG (MOD) 24 hr tablet, Take 1 tablet (500 mg total) by mouth 2 (two) times daily with a meal., Disp: , Rfl:  .  multivitamin (THERAGRAN) per tablet, Take 1 tablet by mouth daily.  , Disp: , Rfl:  .  Omega-3 Fatty Acids (FISH  OIL) 1200 MG CAPS, Take 2 capsules by mouth 2 (two) times daily.  , Disp: , Rfl:  .  piroxicam (FELDENE) 10 MG capsule, 1-2 daily prn, Disp: 60 capsule, Rfl: 2 .  propranolol (INDERAL) 80 MG tablet, Take 1 tablet (80 mg total) by mouth 2 (two) times daily., Disp: 60 tablet, Rfl: 2 .  simvastatin (ZOCOR) 20 MG tablet, Take 1 tablet (20 mg total) by mouth daily., Disp: 30 tablet, Rfl: 0 .  tamsulosin (FLOMAX) 0.4 MG CAPS capsule, Take 1 capsule (0.4 mg total) by mouth daily. Reported on 09/23/2015, Disp: 30 capsule, Rfl:  .  traMADol (ULTRAM) 50 MG tablet, Take 1 tablet (50 mg total) by mouth every 8 (eight) hours as needed., Disp: 30 tablet, Rfl: 0 .  Turmeric 500 MG CAPS, Take 1 capsule by mouth daily., Disp: , Rfl:  .  UNABLE TO FIND, Migra-Eeze, Disp: , Rfl: :  Allergies  Allergen Reactions  . Codeine   . Crestor [Rosuvastatin Calcium]   . Hydrocodone-Acetaminophen     vicodin   . Other Nausea Only    Apples and tomatoes   . Rosuvastatin     REACTION: REALLY BAD JOINT/MUSCLE PAIN  :  Family History  Problem Relation Age of Onset  . Mental illness Mother     alzheimers  . Heart disease Mother   . Heart disease Father     triple by pass  . Hypertension Brother     Prostate Cancer  . Prostate cancer Brother   . Hypertension Brother   . Hypertension Brother   . COPD Brother   . Colon cancer Neg Hx   . Colon polyps Neg Hx   . Rectal cancer Neg Hx   . Stomach cancer Neg Hx   :  Social History   Social History  . Marital status: Married    Spouse name: N/A  . Number of children: 0  . Years of education: N/A   Occupational History  . retired Retired    Optometrist   Social History Main Topics  . Smoking status: Former Smoker    Years: 5.00    Types: Cigarettes    Quit date: 10/10/1965  . Smokeless tobacco: Never Used  . Alcohol use 0.0 oz/week     Comment: monthly on a social basis   . Drug use: No  . Sexual activity: Yes    Partners: Female   Other Topics  Concern  . Not on file   Social History Narrative   Exercise-- treadmill, stationary bike     :  Pertinent items are noted in HPI.  Exam: Blood pressure (!) 158/76, pulse 61, temperature 97.6 F (36.4 C), temperature  source Oral, resp. rate 18, height 5\' 9"  (1.753 m), weight 181 lb 4.8 oz (82.2 kg), SpO2 99 %.  ECOG 0. General appearance: alert and cooperative appeared without distress. Head: Normocephalic, without obvious abnormality Throat: lips, mucosa, and tongue normal; teeth and gums normal Neck: no adenopathy Back: negative Resp: clear to auscultation bilaterally no rhonchi, wheezes or dullness to percussion. Chest wall: no tenderness Cardio: regular rate and rhythm, S1, S2 normal, no murmur, click, rub or gallop GI: soft, non-tender; bowel sounds normal; no masses,  no splenomegaly. Extremities: extremities normal, atraumatic, no cyanosis or edema Pulses: 2+ and symmetric  CBC    Component Value Date/Time   WBC 5.9 10/06/2016 1009   RBC 5.63 10/06/2016 1009   HGB 18.5 cH (HH) 10/06/2016 1009   HGB 16.7 05/08/2013 0914   HCT 52.4 (H) 10/06/2016 1009   HCT 46.9 05/08/2013 0914   PLT 171.0 10/06/2016 1009   PLT 132 (L) 05/08/2013 0914   MCV 93.0 10/06/2016 1009   MCV 91.6 05/08/2013 0914   MCH 32.6 05/08/2013 0914   MCH 32.3 08/05/2011 1515   MCHC 35.3 10/06/2016 1009   RDW 13.1 10/06/2016 1009   RDW 12.3 05/08/2013 0914   LYMPHSABS 1.0 10/06/2016 1009   LYMPHSABS 1.0 05/08/2013 0914   MONOABS 0.5 10/06/2016 1009   MONOABS 0.4 05/08/2013 0914   EOSABS 0.2 10/06/2016 1009   EOSABS 0.2 05/08/2013 0914   BASOSABS 0.0 10/06/2016 1009   BASOSABS 0.0 05/08/2013 0914     Assessment and Plan:   71 year old with the following issues:  1.Polycythemia: The differential diagnosis was discussed with the patient and his wife. Secondary causes that includes chronic hypoxic states associated with toxic fume exposure for sleep apnea other likely causes. Relative  erythrocytosis associated with diuretic and dehydration could also be contributing. I see no other contributing medications at this point that could be causing this. There is no major changes in his medications as of late.  Primary causes such as polycythemia vera are also a consideration. These are considered less likely but certainly a possibility.  From a management standpoint, I have recommended adequate hydration and make sure to avoid exposure to paint fumes and the immediate future. I've also recommended repeating his CBC in 2 months as well as screening for polycythemia vera. If his hemoglobin continues to persist at a higher level and his screen for polycythemia vera is positive with JAK2 mutation, therapeutic phlebotomy would be considered.  From a thrombosis prophylaxis standpoint, I recommended continuing low-dose aspirin as he is doing.  2. History of thrombocytopenia: This was appeared to be medication related and has resolved at this time.  3. Follow-up: Will be in 2 months to repeat his CBC.

## 2016-11-15 NOTE — Progress Notes (Signed)
HPI: FU CAD with prior PCI of his LAD in May 2003. His last cardiac catheterization was performed on December 16, 2008 by Dr. Olevia Perches for exertional chest pain. He was found to have a 30-40% LAD just distal to the stent. There was no other obstructive disease noted. The ejection fraction is 50%. He has been treated medically. Abdominal ultrasound in Oct 2011 showed no aneurysm. Nuclear study in January of 2014 showed an ejection fraction of 61%. There was no ischemia. Since I last saw him, the patient denies any dyspnea on exertion, orthopnea, PND, pedal edema, palpitations, syncope or chest pain.   Current Outpatient Prescriptions  Medication Sig Dispense Refill  . aspirin 81 MG tablet Take 81 mg by mouth daily.      . Biotin 1000 MCG tablet Take 1,000 mcg by mouth daily.     . Cinnamon 500 MG capsule Take 1,000 mg by mouth 2 (two) times daily.     . Coenzyme Q10 (COQ-10) 100 MG capsule Take 100 mg by mouth 2 (two) times daily.     Marland Kitchen ezetimibe (ZETIA) 10 MG tablet Take 1 tablet (10 mg total) by mouth daily.    Marland Kitchen FEVERFEW PO Take 1 capsule by mouth 3 (three) times daily.    . fluticasone (FLONASE) 50 MCG/ACT nasal spray Place 2 sprays into both nostrils daily. 16 g 5  . Ginger, Zingiber officinalis, (GINGER ROOT) 550 MG CAPS Take 1 capsule by mouth daily.      Marland Kitchen levothyroxine (SYNTHROID, LEVOTHROID) 112 MCG tablet Take 1 tablet (112 mcg total) by mouth daily.    Marland Kitchen losartan-hydrochlorothiazide (HYZAAR) 100-12.5 MG tablet Take 1 tablet by mouth daily. 30 tablet 11  . magnesium 30 MG tablet Take 30 mg by mouth 2 (two) times daily.    . metFORMIN (GLUMETZA) 500 MG (MOD) 24 hr tablet Take 1 tablet (500 mg total) by mouth 2 (two) times daily with a meal.    . multivitamin (THERAGRAN) per tablet Take 1 tablet by mouth daily.      . Omega-3 Fatty Acids (FISH OIL) 1200 MG CAPS Take 2 capsules by mouth 2 (two) times daily.      . piroxicam (FELDENE) 10 MG capsule 1-2 daily prn 60 capsule 2  .  propranolol (INDERAL) 80 MG tablet Take 1 tablet (80 mg total) by mouth 2 (two) times daily. 60 tablet 2  . simvastatin (ZOCOR) 20 MG tablet Take 1 tablet (20 mg total) by mouth daily. 30 tablet 0  . tamsulosin (FLOMAX) 0.4 MG CAPS capsule Take 1 capsule (0.4 mg total) by mouth daily. Reported on 09/23/2015 30 capsule   . traMADol (ULTRAM) 50 MG tablet Take 1 tablet (50 mg total) by mouth every 8 (eight) hours as needed. 30 tablet 0  . Turmeric 500 MG CAPS Take 1 capsule by mouth daily.    Marland Kitchen UNABLE TO FIND Migra-Eeze     No current facility-administered medications for this visit.      Past Medical History:  Diagnosis Date  . Allergy   . Arthritis   . CAD (coronary artery disease)    s/p PTCI in 2003 and 2004 to the LAD.   Marland Kitchen Depression   . Diabetes mellitus   . History of placement of ear tubes   . Hyperlipidemia   . Hypertension   . Hypothyroid   . Internal hemorrhoids   . Migraine headache   . Skin cancer, basal cell   . Thrombocytopenia ([place])   .  Thrombocytopenia, unspecified 05/08/2013  . TIA (transient ischemic attack) 2006   per patient's report.  He was never officially given this diagnosis    Past Surgical History:  Procedure Laterality Date  . COLONOSCOPY  2011  . coronary artery disease status post placement     of drug-eluting stent in the LAD in 2003,eEF 65% then  . fatty tissue removed     from neck 2003  . HERNIA REPAIR  2007  . POLYPECTOMY  2011   +TA  . right toe bone spur surgery    . THYROIDECTOMY, PARTIAL  07/2002   right thyroid  . TONSILLECTOMY      Social History   Social History  . Marital status: Married    Spouse name: N/A  . Number of children: 0  . Years of education: N/A   Occupational History  . retired Retired    Optometrist   Social History Main Topics  . Smoking status: Former Smoker    Years: 5.00    Types: Cigarettes    Quit date: 10/10/1965  . Smokeless tobacco: Never Used  . Alcohol use 0.0 oz/week     Comment: monthly  on a social basis   . Drug use: No  . Sexual activity: Yes    Partners: Female   Other Topics Concern  . Not on file   Social History Narrative   Exercise-- treadmill, stationary bike       Family History  Problem Relation Age of Onset  . Mental illness Mother     alzheimers  . Heart disease Mother   . Heart disease Father     triple by pass  . Hypertension Brother     Prostate Cancer  . Prostate cancer Brother   . Hypertension Brother   . Hypertension Brother   . COPD Brother   . Colon cancer Neg Hx   . Colon polyps Neg Hx   . Rectal cancer Neg Hx   . Stomach cancer Neg Hx     ROS: Some arthralgias but no fevers or chills, productive cough, hemoptysis, dysphasia, odynophagia, melena, hematochezia, dysuria, hematuria, rash, seizure activity, orthopnea, PND, pedal edema, claudication. Remaining systems are negative.  Physical Exam: Well-developed well-nourished in no acute distress.  Skin is warm and dry.  HEENT is normal.  Neck is supple. No bruits  Chest is clear to auscultation with normal expansion.  Cardiovascular exam is regular rate and rhythm.  Abdominal exam nontender or distended. No masses palpated. Extremities show no edema. neuro grossly intact  ECG-Sinus, No ST changes  A/P  1 Coronary artery disease-continue aspirin and statin.  2 hypertension-blood pressure borderline. Continue present medications and follow.   3 hyperlipidemia-LDL 10/06/16-99. Discontinue Zocor. Begin Lipitor 40 mg daily. Check lipids and liver in 4 weeks.  Kirk Ruths, MD

## 2016-11-16 DIAGNOSIS — N401 Enlarged prostate with lower urinary tract symptoms: Secondary | ICD-10-CM | POA: Diagnosis not present

## 2016-11-16 DIAGNOSIS — R972 Elevated prostate specific antigen [PSA]: Secondary | ICD-10-CM | POA: Diagnosis not present

## 2016-11-16 DIAGNOSIS — R3912 Poor urinary stream: Secondary | ICD-10-CM | POA: Diagnosis not present

## 2016-11-21 ENCOUNTER — Encounter: Payer: Self-pay | Admitting: Cardiology

## 2016-11-21 ENCOUNTER — Ambulatory Visit (INDEPENDENT_AMBULATORY_CARE_PROVIDER_SITE_OTHER): Payer: Medicare Other | Admitting: Cardiology

## 2016-11-21 VITALS — BP 136/80 | HR 62 | Ht 69.0 in | Wt 182.0 lb

## 2016-11-21 DIAGNOSIS — I2510 Atherosclerotic heart disease of native coronary artery without angina pectoris: Secondary | ICD-10-CM | POA: Diagnosis not present

## 2016-11-21 DIAGNOSIS — E785 Hyperlipidemia, unspecified: Secondary | ICD-10-CM | POA: Diagnosis not present

## 2016-11-21 DIAGNOSIS — I10 Essential (primary) hypertension: Secondary | ICD-10-CM | POA: Diagnosis not present

## 2016-11-21 MED ORDER — PROPRANOLOL HCL 80 MG PO TABS: 80.0000 mg | ORAL_TABLET | Freq: Two times a day (BID) | ORAL | 11 refills | Status: DC

## 2016-11-21 MED ORDER — LOSARTAN POTASSIUM-HCTZ 100-12.5 MG PO TABS: 1.0000 | ORAL_TABLET | Freq: Every day | ORAL | 11 refills | Status: DC

## 2016-11-21 MED ORDER — ATORVASTATIN CALCIUM 40 MG PO TABS: 40.0000 mg | ORAL_TABLET | Freq: Every day | ORAL | 11 refills | Status: DC

## 2016-11-21 NOTE — Patient Instructions (Signed)
Medication Instructions:   STOP SIMVASTATIN  START ATORVASTATIN 40 MG ONCE DAILY  Labwork:  Your physician recommends that you return for lab work in: 4 WEEKS= DO NOT EAT PRIOR TO LAB WORK  Follow-Up:  Your physician wants you to follow-up in: [place] will receive a reminder letter in the mail two months in advance. If you don't receive a letter, please call our office to schedule the follow-up appointment.   If you need a refill on your cardiac medications before your next appointment, please call your pharmacy.

## 2017-01-02 DIAGNOSIS — E785 Hyperlipidemia, unspecified: Secondary | ICD-10-CM | POA: Diagnosis not present

## 2017-01-03 LAB — HEPATIC FUNCTION PANEL
ALK PHOS: 59 U/L (ref 40–115)
ALT: 19 U/L (ref 9–46)
AST: 23 U/L (ref 10–35)
Albumin: 3.6 g/dL (ref 3.6–5.1)
Bilirubin, Direct: 0.3 mg/dL — ABNORMAL HIGH (ref <=0.2)
Indirect Bilirubin: 0.7 mg/dL (ref 0.2–1.2)
TOTAL PROTEIN: 6.1 g/dL (ref 6.1–8.1)
Total Bilirubin: 1 mg/dL (ref 0.2–1.2)

## 2017-01-03 LAB — LIPID PANEL
CHOL/HDL RATIO: 1.9 ratio (ref <5.0)
Cholesterol: 87 mg/dL (ref <200)
HDL: 45 mg/dL (ref >40)
LDL Cholesterol: 32 mg/dL (ref <100)
TRIGLYCERIDES: 49 mg/dL (ref <150)
VLDL: 10 mg/dL (ref <30)

## 2017-01-05 ENCOUNTER — Other Ambulatory Visit (HOSPITAL_BASED_OUTPATIENT_CLINIC_OR_DEPARTMENT_OTHER): Payer: Medicare Other

## 2017-01-05 ENCOUNTER — Ambulatory Visit (HOSPITAL_BASED_OUTPATIENT_CLINIC_OR_DEPARTMENT_OTHER): Payer: Medicare Other | Admitting: Oncology

## 2017-01-05 VITALS — BP 155/79 | HR 65 | Temp 98.5°F | Resp 18 | Wt 180.8 lb

## 2017-01-05 DIAGNOSIS — D751 Secondary polycythemia: Secondary | ICD-10-CM

## 2017-01-05 DIAGNOSIS — D696 Thrombocytopenia, unspecified: Secondary | ICD-10-CM | POA: Diagnosis not present

## 2017-01-05 LAB — COMPREHENSIVE METABOLIC PANEL
ALK PHOS: 75 U/L (ref 40–150)
ALT: 23 U/L (ref 0–55)
AST: 23 U/L (ref 5–34)
Albumin: 3.7 g/dL (ref 3.5–5.0)
Anion Gap: 8 mEq/L (ref 3–11)
BUN: 26.1 mg/dL — AB (ref 7.0–26.0)
CO2: 25 meq/L (ref 22–29)
Calcium: 9.1 mg/dL (ref 8.4–10.4)
Chloride: 107 mEq/L (ref 98–109)
Creatinine: 1.3 mg/dL (ref 0.7–1.3)
EGFR: 55 mL/min/{1.73_m2} — AB (ref >90)
GLUCOSE: 86 mg/dL (ref 70–140)
POTASSIUM: 4.3 meq/L (ref 3.5–5.1)
SODIUM: 141 meq/L (ref 136–145)
Total Bilirubin: 0.81 mg/dL (ref 0.20–1.20)
Total Protein: 6.4 g/dL (ref 6.4–8.3)

## 2017-01-05 LAB — CBC WITH DIFFERENTIAL/PLATELET
BASO%: 0.8 % (ref 0.0–2.0)
Basophils Absolute: 0.1 10*3/uL (ref 0.0–0.1)
EOS%: 5.8 % (ref 0.0–7.0)
Eosinophils Absolute: 0.4 10*3/uL (ref 0.0–0.5)
HCT: 45.1 % (ref 38.4–49.9)
HEMOGLOBIN: 15.9 g/dL (ref 13.0–17.1)
LYMPH%: 19.2 % (ref 14.0–49.0)
MCH: 32.2 pg (ref 27.2–33.4)
MCHC: 35.2 g/dL (ref 32.0–36.0)
MCV: 91.4 fL (ref 79.3–98.0)
MONO#: 0.7 10*3/uL (ref 0.1–0.9)
MONO%: 11 % (ref 0.0–14.0)
NEUT%: 63.2 % (ref 39.0–75.0)
NEUTROS ABS: 4.2 10*3/uL (ref 1.5–6.5)
PLATELETS: 158 10*3/uL (ref 140–400)
RBC: 4.94 10*6/uL (ref 4.20–5.82)
RDW: 12.7 % (ref 11.0–14.6)
WBC: 6.7 10*3/uL (ref 4.0–10.3)
lymph#: 1.3 10*3/uL (ref 0.9–3.3)

## 2017-01-05 NOTE — Progress Notes (Signed)
Hematology and Oncology Follow Up Visit  [name] [id number] [birth year] 71 y.o. 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 His repeat laboratory testing in March 2018 showed normal laboratory findings.  Current therapy: Observation and surveillance.  Interim History:  [name] presents today for a follow-up visit. Since the last visit, he reports feeling well without much improved migraine symptoms. He had medication adjustment which have helped his headaches. He reports very little to no headaches at this time. He denied any thrombosis or bleeding episodes. He remains active and continues to perform activities of daily living. His appetite is excellent and weight is stable.  He does not report any blurry vision, syncope or seizures. He does not report any fevers, chills, sweats or weight loss. He does not report any chest pain, palpitation, orthopnea or leg edema. He does not report any cough, wheezing or hemoptysis. He does not report any nausea, vomiting or abdominal pain. He does not report any frequency urgency or hesitancy. Remaining review of systems unremarkable.  Medications: I have reviewed the patient's current medications.  Current Outpatient Prescriptions  Medication Sig Dispense Refill  . aspirin 81 MG tablet Take 81 mg by mouth daily.      Marland Kitchen atorvastatin (LIPITOR) 40 MG tablet Take 1 tablet (40 mg total) by mouth daily. 30 tablet 11  . Biotin 1000 MCG tablet Take 1,000 mcg by mouth daily.     . Cinnamon 500 MG capsule Take 1,000 mg by mouth 2 (two) times daily.     . Coenzyme Q10 (COQ-10) 100 MG capsule Take 100 mg by mouth 2 (two) times daily.     Marland Kitchen ezetimibe (ZETIA) 10 MG tablet Take 1 tablet (10 mg total) by mouth daily.    Marland Kitchen FEVERFEW PO Take 1 capsule by mouth 3 (three) times daily.    . fluticasone (FLONASE) 50 MCG/ACT nasal spray Place 2  sprays into both nostrils daily. 16 g 5  . Ginger, Zingiber officinalis, (GINGER ROOT) 550 MG CAPS Take 1 capsule by mouth daily.      Marland Kitchen levothyroxine (SYNTHROID, LEVOTHROID) 112 MCG tablet Take 1 tablet (112 mcg total) by mouth daily.    Marland Kitchen losartan-hydrochlorothiazide (HYZAAR) 100-12.5 MG tablet Take 1 tablet by mouth daily. 30 tablet 11  . magnesium 30 MG tablet Take 30 mg by mouth 2 (two) times daily.    . metFORMIN (GLUMETZA) 500 MG (MOD) 24 hr tablet Take 1 tablet (500 mg total) by mouth 2 (two) times daily with a meal.    . multivitamin (THERAGRAN) per tablet Take 1 tablet by mouth daily.      . Omega-3 Fatty Acids (FISH OIL) 1200 MG CAPS Take 2 capsules by mouth 2 (two) times daily.      . piroxicam (FELDENE) 10 MG capsule 1-2 daily prn 60 capsule 2  . propranolol (INDERAL) 80 MG tablet Take 1 tablet (80 mg total) by mouth 2 (two) times daily. 60 tablet 11  . simvastatin (ZOCOR) 20 MG tablet     . tamsulosin (FLOMAX) 0.4 MG CAPS capsule Take 1 capsule (0.4 mg total) by mouth daily. Reported on 09/23/2015 30 capsule   . traMADol (ULTRAM) 50 MG tablet Take 1 tablet (50 mg total) by mouth every 8 (eight) hours as needed. 30 tablet 0  . Turmeric 500 MG CAPS Take 1 capsule by mouth daily.    Marland Kitchen UNABLE  TO FIND Migra-Eeze     No current facility-administered medications for this visit.      Allergies:  Allergies  Allergen Reactions  . Codeine   . Crestor [Rosuvastatin Calcium]   . Hydrocodone-Acetaminophen     vicodin   . Other Nausea Only    Apples and tomatoes   . Rosuvastatin     REACTION: REALLY BAD JOINT/MUSCLE PAIN    Past Medical History, Surgical history, Social history, and Family History were reviewed and updated.  Physical Exam: Blood pressure (!) 155/79, pulse 65, temperature 98.5 F (36.9 C), temperature source Oral, resp. rate 18, weight 180 lb 12.8 oz (82 kg), SpO2 98 %. ECOG: 0 General appearance: alert and cooperative Head: Normocephalic, without obvious  abnormality Neck: no adenopathy Lymph nodes: Cervical, supraclavicular, and axillary nodes normal. Heart:regular rate and rhythm, S1, S2 normal, no murmur, click, rub or gallop Lung:chest clear, no wheezing, rales, normal symmetric air entry, Heart exam - S1, S2 normal, no murmur, no gallop, rate regular Abdomin: soft, non-tender, without masses or organomegaly EXT:no erythema, induration, or nodules   Lab Results: Lab Results  Component Value Date   WBC 6.7 01/05/2017   HGB 15.9 01/05/2017   HCT 45.1 01/05/2017   MCV 91.4 01/05/2017   PLT 158 01/05/2017     Chemistry      Component Value Date/Time   NA 141 10/06/2016 1009   NA 140 03/27/2014   NA 141 05/08/2013 0914   K 4.5 10/06/2016 1009   K 4.1 05/08/2013 0914   CL 103 10/06/2016 1009   CO2 34 (H) 10/06/2016 1009   CO2 27 05/08/2013 0914   BUN 19 10/06/2016 1009   BUN 22 (A) 03/27/2014   BUN 22.4 05/08/2013 0914   CREATININE 1.17 10/06/2016 1009   CREATININE 1.3 05/08/2013 0914   GLU 105 03/27/2014      Component Value Date/Time   CALCIUM 9.7 10/06/2016 1009   CALCIUM 9.2 05/08/2013 0914   ALKPHOS 59 01/02/2017 1029   ALKPHOS 61 05/08/2013 0914   AST 23 01/02/2017 1029   AST 22 05/08/2013 0914   ALT 19 01/02/2017 1029   ALT 23 05/08/2013 0914   BILITOT 1.0 01/02/2017 1029   BILITOT 0.98 05/08/2013 0914       Impression and Plan:  71 year old with the following issues:  1.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 Relative erythrocytosis associated with diuretic and dehydration could also be contributing.  His hemoglobin was repeated today and shows normal range at this time. He had fluctuations of his hemoglobin previously with spontaneous normalization. These findings suggest a secondary polycythemia rather than a polycythemia vera or a myeloproliferative disorder.  I do not recommend any further  hematological workup at this time.   2. Thrombocytopenia: Appears to have resolved at this time. His repeat platelet count is within normal range.  3. Follow-up: I'm happy to see him in the future as needed.   Zola Button, MD 3/29/20183:46 PM

## 2017-01-06 LAB — ERYTHROPOIETIN: Erythropoietin: 15.8 m[IU]/mL (ref 2.6–18.5)

## 2017-01-17 ENCOUNTER — Encounter: Payer: Self-pay | Admitting: Neurology

## 2017-01-17 ENCOUNTER — Ambulatory Visit (INDEPENDENT_AMBULATORY_CARE_PROVIDER_SITE_OTHER): Payer: Medicare Other | Admitting: Neurology

## 2017-01-17 VITALS — BP 138/70 | HR 65 | Ht 69.0 in | Wt 180.2 lb

## 2017-01-17 DIAGNOSIS — G43109 Migraine with aura, not intractable, without status migrainosus: Secondary | ICD-10-CM

## 2017-01-17 DIAGNOSIS — I2510 Atherosclerotic heart disease of native coronary artery without angina pectoris: Secondary | ICD-10-CM | POA: Diagnosis not present

## 2017-01-17 DIAGNOSIS — G250 Essential tremor: Secondary | ICD-10-CM | POA: Diagnosis not present

## 2017-01-17 NOTE — Patient Instructions (Signed)
1.  Continue propranolol.  May use acetaminophen 650mg  with tramadol if needed. 2.  Follow up in 6 months.   Mediterranean Diet A Mediterranean diet refers to food and lifestyle choices that are based on the traditions of countries located on the [employer]. This way of eating has been shown to help prevent certain conditions and improve outcomes for people who have chronic diseases, like kidney disease and heart disease. What are tips for following this plan? Lifestyle   Cook and eat meals together with your family, when possible.  Drink enough fluid to keep your urine clear or pale yellow.  Be physically active every day. This includes:  Aerobic exercise like running or swimming.  Leisure activities like gardening, walking, or housework.  Get 7-8 hours of sleep each night.  If recommended by your health care provider, drink red wine in moderation. This means 1 glass a day for nonpregnant women and 2 glasses a day for men. A glass of wine equals 5 oz (150 mL). Reading food labels   Check the serving size of packaged foods. For foods such as rice and pasta, the serving size refers to the amount of cooked product, not dry.  Check the total fat in packaged foods. Avoid foods that have saturated fat or trans fats.  Check the ingredients list for added sugars, such as corn syrup. Shopping   At the grocery store, buy most of your food from the areas near the walls of the store. This includes:  Fresh fruits and vegetables (produce).  Grains, beans, nuts, and seeds. Some of these may be available in unpackaged forms or large amounts (in bulk).  Fresh seafood.  Poultry and eggs.  Low-fat dairy products.  Buy whole ingredients instead of prepackaged foods.  Buy fresh fruits and vegetables in-season from local farmers markets.  Buy frozen fruits and vegetables in resealable bags.  If you do not have access to quality fresh seafood, buy precooked frozen shrimp or canned  fish, such as tuna, salmon, or sardines.  Buy small amounts of raw or cooked vegetables, salads, or olives from the deli or salad bar at your store.  Stock your pantry so you always have certain foods on hand, such as olive oil, canned tuna, canned tomatoes, rice, pasta, and beans. Cooking   Cook foods with extra-virgin olive oil instead of using butter or other vegetable oils.  Have meat as a side dish, and have vegetables or grains as your main dish. This means having meat in small portions or adding small amounts of meat to foods like pasta or stew.  Use beans or vegetables instead of meat in common dishes like chili or lasagna.  Experiment with different cooking methods. Try roasting or broiling vegetables instead of steaming or sauteing them.  Add frozen vegetables to soups, stews, pasta, or rice.  Add nuts or seeds for added healthy fat at each meal. You can add these to yogurt, salads, or vegetable dishes.  Marinate fish or vegetables using olive oil, lemon juice, garlic, and fresh herbs. Meal planning   Plan to eat 1 vegetarian meal one day each week. Try to work up to 2 vegetarian meals, if possible.  Eat seafood 2 or more times a week.  Have healthy snacks readily available, such as:  Vegetable sticks with hummus.  Greek yogurt.  Fruit and nut trail mix.  Eat balanced meals throughout the week. This includes:  Fruit: 2-3 servings a day  Vegetables: 4-5 servings a day  Low-fat  dairy: 2 servings a day  Fish, poultry, or lean meat: 1 serving a day  Beans and legumes: 2 or more servings a week  Nuts and seeds: 1-2 servings a day  Whole grains: 6-8 servings a day  Extra-virgin olive oil: 3-4 servings a day  Limit red meat and sweets to only a few servings a month What are my food choices?  Mediterranean diet  Recommended  Grains: Whole-grain pasta. Brown rice. Bulgar wheat. Polenta. Couscous. Whole-wheat bread. [place].  Vegetables:  Artichokes. Beets. Broccoli. Cabbage. Carrots. Eggplant. Green beans. Chard. Kale. Spinach. Onions. Leeks. Peas. Squash. Tomatoes. Peppers. Radishes.  Fruits: Apples. Apricots. Avocado. Berries. Bananas. Cherries. Dates. Figs. Grapes. Lemons. Melon. Oranges. Peaches. Plums. Pomegranate.  Meats and other protein foods: Beans. Almonds. Sunflower seeds. Pine nuts. Peanuts. [place]. Salmon. Scallops. Shrimp. [place]. Tilapia. Clams. Oysters. Eggs.  Dairy: Low-fat milk. Cheese. Greek yogurt.  Beverages: Water. Red wine. Herbal tea.  Fats and oils: Extra virgin olive oil. Avocado oil. Grape seed oil.  Sweets and desserts: [place] yogurt with honey. Baked apples. Poached pears. Trail mix.  Seasoning and other foods: Basil. Cilantro. Coriander. Cumin. Mint. Parsley. Sage. Rosemary. Tarragon. Garlic. Oregano. Thyme. Pepper. Balsalmic vinegar. Tahini. Hummus. Tomato sauce. Olives. Mushrooms.  Limit these  Grains: Prepackaged pasta or rice dishes. Prepackaged cereal with added sugar.  Vegetables: Deep fried potatoes (french fries).  Fruits: Fruit canned in syrup.  Meats and other protein foods: Beef. Pork. Lamb. Poultry with skin. Hot dogs. [place] Salines.  Dairy: Ice cream. Sour cream. Whole milk.  Beverages: Juice. Sugar-sweetened soft drinks. Beer. Liquor and spirits.  Fats and oils: Butter. Canola oil. Vegetable oil. Beef fat (tallow). Lard.  Sweets and desserts: Cookies. Cakes. Pies. Candy.  Seasoning and other foods: Mayonnaise. Premade sauces and marinades.  The items listed may not be a complete list. Talk with your dietitian about what dietary choices are right for you. Summary  The Mediterranean diet includes both food and lifestyle choices.  Eat a variety of fresh fruits and vegetables, beans, nuts, seeds, and whole grains.  Limit the amount of red meat and sweets that you eat.  Talk with your health care provider about whether it is safe for you to drink red wine in moderation. This means  1 glass a day for nonpregnant women and 2 glasses a day for men. A glass of wine equals 5 oz (150 mL). This information is not intended to replace advice given to you by your health care provider. Make sure you discuss any questions you have with your health care provider. Document Released: 05/19/2016 Document Revised: 06/21/2016 Document Reviewed: 05/19/2016 Elsevier Interactive Patient Education  2017 [employer].

## 2017-01-17 NOTE — Progress Notes (Signed)
NEUROLOGY FOLLOW UP OFFICE NOTE  [name] [id number]  HISTORY OF PRESENT ILLNESS: [name] is a 71 year old right-handed male with osteoarthritis, hypertension, type 2 diabetes and hyperlipidemia who follows up for migraine and essential tremor.  UPDATE: In coordination with Dr. Stanford Breed, we switched [name] from metoprolol to propranolol 80mg  twice daily to better treat his tremor and migraine.    Migraine: Since last visit, he had only one migraine, 10/10.  It lasted all day.  It occurred not long after last visit.  He has not had a subsequent migraine. Current NSAIDS:  piramxicam daily (for arthritis but seems to help headache) Current analgesics:  acetaminophen with tramadol Current triptans:  no Current anti-emetic:  no Current muscle relaxants:  no Current anti-anxiolytic:  no Current sleep aide:  no Current Antihypertensive medications:  losartain-HCTZ, propranolol 80mg  twice daily Current Antidepressant medications:  no Current Anticonvulsant medications:  no Current Vitamins/Herbal/Supplements:  Biotin, MVI, fish oil, coenzyme Q10, cinnamon, ginger root, turmeric 500mg , magnesium 250mg , riboflavin 400mg , butterbur 150mg  Current Antihistamines/Decongestants:  Flonase Other therapy:  no   Caffeine:  2-3 cups coffee daily Alcohol:  no Smoker:  no Diet:  Hydrates.  No soda Exercise:  yes Depression/stress:  stable Sleep hygiene:  okay  Essential Tremor:  He is taking propranolol 80mg  twice daily.  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  HISTORY: Migraine: Onset:  40-50 years ago.  They would occur once or twice a year.  In September, they started to occur once or twice a week and then almost daily.  Over the past couple of weeks (following tympanostomy tube placement), they have occurred about once a week. Location:  Top of head Quality:  explode Initial Intensity:  10/10 Aura:  Flashing lights/colors in vision Prodrome:   no Associated symptoms:  Nausea, photophobia, phonophobia Initial Duration:  Several hours Initial Frequency:   In September, they started to occur once or twice a week and then almost daily.  Over the past couple of weeks (following tympanostomy tube placement), they have occurred about once a week. Triggers/exacerbating factors:  Stress, sound, NTG, light Relieving factors:  sleep Activity:  Needs to lay down   Past NSAIDS:  Celebrex Past analgesics:  no Past abortive triptans:  Sumatriptan (stopped due to CAD). Past muscle relaxants:  no Past anti-emetic:  no Past antihypertensive medications:  Propranolol (many years ago) Past antidepressant medications:  Prozac, amitriptyline Past anticonvulsant medications:  no Past vitamins/Herbal/Supplements:  no Other past therapies:  Biofeedback, relaxation therapy    MRI of brain without contrast from 08/11/16 was personally reviewed and revealed mild chronic small vessel ischemic changes but no acute stroke, bleed or mass lesion.  However, it did reveal mild mastoiditis.  He saw ENT and had tympanostomy tube placement on 09/22/16.     Essential Tremor: For over 10 years, he has had a tremor, particularly in his right hand.  It has become more noticeable.  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  There is no known family history.  PAST MEDICAL HISTORY: Past Medical History:  Diagnosis Date  . Allergy   . Arthritis   . CAD (coronary artery disease)    s/p PTCI in 2003 and 2004 to the LAD.   Marland Kitchen Depression   . Diabetes mellitus   . History of placement of ear tubes   . Hyperlipidemia   . Hypertension   . Hypothyroid   . Internal hemorrhoids   .  Migraine headache   . Skin cancer, basal cell   . Thrombocytopenia ([place])   . Thrombocytopenia, unspecified 05/08/2013  . TIA (transient ischemic attack) 2006   per patient's report.  He was never officially given this diagnosis    MEDICATIONS: Current Outpatient  Prescriptions on File Prior to Visit  Medication Sig Dispense Refill  . aspirin 81 MG tablet Take 81 mg by mouth daily.      Marland Kitchen atorvastatin (LIPITOR) 40 MG tablet Take 1 tablet (40 mg total) by mouth daily. 30 tablet 11  . Biotin 1000 MCG tablet Take 1,000 mcg by mouth daily.     . Cinnamon 500 MG capsule Take 1,000 mg by mouth 2 (two) times daily.     . Coenzyme Q10 (COQ-10) 100 MG capsule Take 100 mg by mouth 2 (two) times daily.     Marland Kitchen ezetimibe (ZETIA) 10 MG tablet Take 1 tablet (10 mg total) by mouth daily.    Marland Kitchen FEVERFEW PO Take 1 capsule by mouth 3 (three) times daily.    . fluticasone (FLONASE) 50 MCG/ACT nasal spray Place 2 sprays into both nostrils daily. 16 g 5  . Ginger, Zingiber officinalis, (GINGER ROOT) 550 MG CAPS Take 1 capsule by mouth daily.      Marland Kitchen levothyroxine (SYNTHROID, LEVOTHROID) 112 MCG tablet Take 1 tablet (112 mcg total) by mouth daily.    Marland Kitchen losartan-hydrochlorothiazide (HYZAAR) 100-12.5 MG tablet Take 1 tablet by mouth daily. 30 tablet 11  . magnesium 30 MG tablet Take 30 mg by mouth 2 (two) times daily.    . metFORMIN (GLUMETZA) 500 MG (MOD) 24 hr tablet Take 1 tablet (500 mg total) by mouth 2 (two) times daily with a meal.    . multivitamin (THERAGRAN) per tablet Take 1 tablet by mouth daily.      . Omega-3 Fatty Acids (FISH OIL) 1200 MG CAPS Take 2 capsules by mouth 2 (two) times daily.      . piroxicam (FELDENE) 10 MG capsule 1-2 daily prn 60 capsule 2  . propranolol (INDERAL) 80 MG tablet Take 1 tablet (80 mg total) by mouth 2 (two) times daily. 60 tablet 11  . simvastatin (ZOCOR) 20 MG tablet     . tamsulosin (FLOMAX) 0.4 MG CAPS capsule Take 1 capsule (0.4 mg total) by mouth daily. Reported on 09/23/2015 30 capsule   . traMADol (ULTRAM) 50 MG tablet Take 1 tablet (50 mg total) by mouth every 8 (eight) hours as needed. 30 tablet 0  . Turmeric 500 MG CAPS Take 1 capsule by mouth daily.    Marland Kitchen UNABLE TO FIND Migra-Eeze     No current facility-administered  medications on file prior to visit.     ALLERGIES: Allergies  Allergen Reactions  . Codeine   . Crestor [Rosuvastatin Calcium]   . Hydrocodone-Acetaminophen     vicodin   . Other Nausea Only    Apples and tomatoes   . Rosuvastatin     REACTION: REALLY BAD JOINT/MUSCLE PAIN    FAMILY HISTORY: Family History  Problem Relation Age of Onset  . Mental illness Mother     alzheimers  . Heart disease Mother   . Heart disease Father     triple by pass  . Hypertension Brother     Prostate Cancer  . Prostate cancer Brother   . Hypertension Brother   . Hypertension Brother   . COPD Brother   . Colon cancer Neg Hx   . Colon polyps Neg Hx   .  Rectal cancer Neg Hx   . Stomach cancer Neg Hx     SOCIAL HISTORY: Social History   Social History  . Marital status: Married    Spouse name: N/A  . Number of children: 0  . Years of education: N/A   Occupational History  . retired Retired    Optometrist   Social History Main Topics  . Smoking status: Former Smoker    Years: 5.00    Types: Cigarettes    Quit date: 10/10/1965  . Smokeless tobacco: Never Used  . Alcohol use 0.0 oz/week     Comment: monthly on a social basis   . Drug use: No  . Sexual activity: Yes    Partners: Female   Other Topics Concern  . Not on file   Social History Narrative   Exercise-- treadmill, stationary bike       REVIEW OF SYSTEMS: Constitutional: No fevers, chills, or sweats, no generalized fatigue, change in appetite Eyes: No visual changes, double vision, eye pain Ear, nose and throat: No hearing loss, ear pain, nasal congestion, sore throat Cardiovascular: No chest pain, palpitations Respiratory:  No shortness of breath at rest or with exertion, wheezes GastrointestinaI: No nausea, vomiting, diarrhea, abdominal pain, fecal incontinence Genitourinary:  No dysuria, urinary retention or frequency Musculoskeletal:  No neck pain, back pain Integumentary: No rash, pruritus, skin  lesions Neurological: as above Psychiatric: No depression, insomnia, anxiety Endocrine: No palpitations, fatigue, diaphoresis, mood swings, change in appetite, change in weight, increased thirst Hematologic/Lymphatic:  No purpura, petechiae. Allergic/Immunologic: no itchy/runny eyes, nasal congestion, recent allergic reactions, rashes  PHYSICAL EXAM: Vitals:   01/17/17 0728  BP: 138/70  Pulse: 65   General: No acute distress.  Patient appears well-groomed.  normal body habitus. Head:  Normocephalic/atraumatic Eyes:  Fundi examined but not visualized Neck: supple, no paraspinal tenderness, full range of motion Heart:  Regular rate and rhythm Lungs:  Clear to auscultation bilaterally Back: No paraspinal tenderness Neurological Exam: alert and oriented to person, place, and time. Attention span and concentration intact, recent and remote memory intact, fund of knowledge intact.  Speech fluent and not dysarthric, language intact.  CN II-XII intact. Bulk and tone normal, muscle strength 5/5 throughout.  Sensation to light touch  intact.  Deep tendon reflexes 2+ throughout.  Finger to nose testing intact.  Gait normal, Romberg negative.  IMPRESSION: Migraine with aura, stable Essential tremor, improved  PLAN: 1.  Continue propranolol 80mg  twice daily 2.  Acetaminophen 650mg  with tramadol as needed 3.  Follow up in 6 months  Metta Clines, DO  CC:  Roma Schanz, DO

## 2017-01-30 DIAGNOSIS — M545 Low back pain: Secondary | ICD-10-CM | POA: Diagnosis not present

## 2017-01-30 DIAGNOSIS — M19042 Primary osteoarthritis, left hand: Secondary | ICD-10-CM | POA: Diagnosis not present

## 2017-02-03 DIAGNOSIS — M545 Low back pain: Secondary | ICD-10-CM | POA: Diagnosis not present

## 2017-02-03 DIAGNOSIS — M5126 Other intervertebral disc displacement, lumbar region: Secondary | ICD-10-CM | POA: Diagnosis not present

## 2017-02-10 DIAGNOSIS — M5126 Other intervertebral disc displacement, lumbar region: Secondary | ICD-10-CM | POA: Diagnosis not present

## 2017-02-10 DIAGNOSIS — M545 Low back pain: Secondary | ICD-10-CM | POA: Diagnosis not present

## 2017-02-17 DIAGNOSIS — M545 Low back pain: Secondary | ICD-10-CM | POA: Diagnosis not present

## 2017-02-17 DIAGNOSIS — M5126 Other intervertebral disc displacement, lumbar region: Secondary | ICD-10-CM | POA: Diagnosis not present

## 2017-02-22 DIAGNOSIS — H7093 Unspecified mastoiditis, bilateral: Secondary | ICD-10-CM | POA: Diagnosis not present

## 2017-02-22 DIAGNOSIS — H6983 Other specified disorders of Eustachian tube, bilateral: Secondary | ICD-10-CM | POA: Diagnosis not present

## 2017-02-28 ENCOUNTER — Other Ambulatory Visit: Payer: Self-pay | Admitting: Otolaryngology

## 2017-02-28 DIAGNOSIS — H7093 Unspecified mastoiditis, bilateral: Secondary | ICD-10-CM

## 2017-03-02 ENCOUNTER — Ambulatory Visit
Admission: RE | Admit: 2017-03-02 | Discharge: 2017-03-02 | Disposition: A | Discharge status: Home / Self Care | Payer: Medicare Other | Source: Ambulatory Visit | Attending: Otolaryngology | Admitting: Otolaryngology

## 2017-03-02 DIAGNOSIS — H9190 Unspecified hearing loss, unspecified ear: Secondary | ICD-10-CM | POA: Diagnosis not present

## 2017-03-02 DIAGNOSIS — H7093 Unspecified mastoiditis, bilateral: Secondary | ICD-10-CM

## 2017-03-02 DIAGNOSIS — H7090 Unspecified mastoiditis, unspecified ear: Secondary | ICD-10-CM | POA: Diagnosis not present

## 2017-04-06 ENCOUNTER — Encounter: Payer: Self-pay | Admitting: Family Medicine

## 2017-04-06 ENCOUNTER — Ambulatory Visit (INDEPENDENT_AMBULATORY_CARE_PROVIDER_SITE_OTHER): Payer: Medicare Other | Admitting: Family Medicine

## 2017-04-06 VITALS — BP 131/62 | HR 62 | Temp 97.8°F | Ht 69.0 in | Wt 174.0 lb

## 2017-04-06 DIAGNOSIS — G43909 Migraine, unspecified, not intractable, without status migrainosus: Secondary | ICD-10-CM

## 2017-04-06 DIAGNOSIS — H6983 Other specified disorders of Eustachian tube, bilateral: Secondary | ICD-10-CM | POA: Diagnosis not present

## 2017-04-06 DIAGNOSIS — I2510 Atherosclerotic heart disease of native coronary artery without angina pectoris: Secondary | ICD-10-CM | POA: Diagnosis not present

## 2017-04-06 DIAGNOSIS — E785 Hyperlipidemia, unspecified: Secondary | ICD-10-CM

## 2017-04-06 DIAGNOSIS — I10 Essential (primary) hypertension: Secondary | ICD-10-CM

## 2017-04-06 DIAGNOSIS — M159 Polyosteoarthritis, unspecified: Secondary | ICD-10-CM | POA: Diagnosis not present

## 2017-04-06 DIAGNOSIS — H66014 Acute suppurative otitis media with spontaneous rupture of ear drum, recurrent, right ear: Secondary | ICD-10-CM

## 2017-04-06 DIAGNOSIS — E1151 Type 2 diabetes mellitus with diabetic peripheral angiopathy without gangrene: Secondary | ICD-10-CM | POA: Diagnosis not present

## 2017-04-06 DIAGNOSIS — E039 Hypothyroidism, unspecified: Secondary | ICD-10-CM

## 2017-04-06 DIAGNOSIS — E1165 Type 2 diabetes mellitus with hyperglycemia: Secondary | ICD-10-CM | POA: Diagnosis not present

## 2017-04-06 DIAGNOSIS — IMO0002 Reserved for concepts with insufficient information to code with codable children: Secondary | ICD-10-CM

## 2017-04-06 HISTORY — DX: Acute suppurative otitis media with spontaneous rupture of ear drum, recurrent, right ear: H66.014

## 2017-04-06 LAB — COMPREHENSIVE METABOLIC PANEL
ALBUMIN: 3.9 g/dL (ref 3.5–5.2)
ALT: 24 U/L (ref 0–53)
AST: 25 U/L (ref 0–37)
Alkaline Phosphatase: 53 U/L (ref 39–117)
BILIRUBIN TOTAL: 0.7 mg/dL (ref 0.2–1.2)
BUN: 28 mg/dL — AB (ref 6–23)
CO2: 29 mEq/L (ref 19–32)
Calcium: 9.3 mg/dL (ref 8.4–10.5)
Chloride: 106 mEq/L (ref 96–112)
Creatinine, Ser: 1.26 mg/dL (ref 0.40–1.50)
GFR: 59.59 mL/min — AB (ref >60.00)
GLUCOSE: 87 mg/dL (ref 70–99)
Potassium: 4.7 mEq/L (ref 3.5–5.1)
Sodium: 140 mEq/L (ref 135–145)
Total Protein: 6.2 g/dL (ref 6.0–8.3)

## 2017-04-06 LAB — LIPID PANEL
CHOL/HDL RATIO: 2
Cholesterol: 80 mg/dL (ref 0–200)
HDL: 37.6 mg/dL — ABNORMAL LOW (ref >39.00)
LDL Cholesterol: 28 mg/dL (ref 0–99)
NONHDL: 42.46
TRIGLYCERIDES: 72 mg/dL (ref 0.0–149.0)
VLDL: 14.4 mg/dL (ref 0.0–40.0)

## 2017-04-06 LAB — HEMOGLOBIN A1C: HEMOGLOBIN A1C: 5.1 % (ref 4.6–6.5)

## 2017-04-06 LAB — TSH: TSH: 1.29 u[IU]/mL (ref 0.35–4.50)

## 2017-04-06 MED ORDER — PIROXICAM 10 MG PO CAPS: ORAL_CAPSULE | ORAL | 2 refills | Status: DC

## 2017-04-06 NOTE — Assessment & Plan Note (Signed)
con't synthroid Check tsh 

## 2017-04-06 NOTE — Progress Notes (Signed)
Patient ID: [name], male    DOB: September 23, 1946  Age: 71 y.o. MRN: [id number]    Subjective:  Subjective  HPI [name] presents for f/u htn, migraines.  Migraines are much better after seeing neuro.    Review of Systems  Constitutional: Negative for appetite change, diaphoresis, fatigue and unexpected weight change.  Eyes: Negative for pain, redness and visual disturbance.  Respiratory: Negative for cough, chest tightness, shortness of breath and wheezing.   Cardiovascular: Negative for chest pain, palpitations and leg swelling.  Endocrine: Negative for cold intolerance, heat intolerance, polydipsia, polyphagia and polyuria.  Genitourinary: Negative for difficulty urinating, dysuria and frequency.  Neurological: Negative for dizziness, light-headedness, numbness and headaches.    History Past Medical History:  Diagnosis Date  . Allergy   . Arthritis   . CAD (coronary artery disease)    s/p PTCI in 2003 and 2004 to the LAD.   Marland Kitchen Depression   . Diabetes mellitus   . History of placement of ear tubes   . Hyperlipidemia   . Hypertension   . Hypothyroid   . Internal hemorrhoids   . Migraine headache   . Skin cancer, basal cell   . Thrombocytopenia ([place])   . Thrombocytopenia, unspecified ([place]) 05/08/2013  . TIA (transient ischemic attack) 2006   per patient's report.  He was never officially given this diagnosis    He has a past surgical history that includes Hernia repair (2007); fatty tissue removed; Thyroidectomy, partial (07/2002); coronary artery disease status post placement; right toe bone spur surgery; Tonsillectomy; Colonoscopy (2011); and Polypectomy (2011).   His family history includes COPD in his brother; Heart disease in his father and mother; Hypertension in his brother, brother, and brother; Mental illness in his mother; Prostate cancer in his brother.He reports that he quit smoking about 51 years ago. His smoking use included Cigarettes. He quit after 5.00  years of use. He has never used smokeless tobacco. He reports that he drinks alcohol. He reports that he does not use drugs.  Current Outpatient Prescriptions on File Prior to Visit  Medication Sig Dispense Refill  . aspirin 81 MG tablet Take 81 mg by mouth daily.      Marland Kitchen atorvastatin (LIPITOR) 40 MG tablet Take 1 tablet (40 mg total) by mouth daily. 30 tablet 11  . Biotin 1000 MCG tablet Take 1,000 mcg by mouth daily.     . Cinnamon 500 MG capsule Take 1,000 mg by mouth 2 (two) times daily.     . Coenzyme Q10 (COQ-10) 100 MG capsule Take 100 mg by mouth 2 (two) times daily.     Marland Kitchen ezetimibe (ZETIA) 10 MG tablet Take 1 tablet (10 mg total) by mouth daily.    Marland Kitchen FEVERFEW PO Take 1 capsule by mouth 3 (three) times daily.    . fluticasone (FLONASE) 50 MCG/ACT nasal spray Place 2 sprays into both nostrils daily. 16 g 5  . Ginger, Zingiber officinalis, (GINGER ROOT) 550 MG CAPS Take 1 capsule by mouth daily.      Marland Kitchen levothyroxine (SYNTHROID, LEVOTHROID) 112 MCG tablet Take 1 tablet (112 mcg total) by mouth daily.    Marland Kitchen losartan-hydrochlorothiazide (HYZAAR) 100-12.5 MG tablet Take 1 tablet by mouth daily. 30 tablet 11  . magnesium 30 MG tablet Take 30 mg by mouth 2 (two) times daily.    . metFORMIN (GLUMETZA) 500 MG (MOD) 24 hr tablet Take 1 tablet (500 mg total) by mouth 2 (two) times daily with a meal.    .  multivitamin (THERAGRAN) per tablet Take 1 tablet by mouth daily.      . Omega-3 Fatty Acids (FISH OIL) 1200 MG CAPS Take 2 capsules by mouth 2 (two) times daily.      . propranolol (INDERAL) 80 MG tablet Take 1 tablet (80 mg total) by mouth 2 (two) times daily. 60 tablet 11  . tamsulosin (FLOMAX) 0.4 MG CAPS capsule Take 1 capsule (0.4 mg total) by mouth daily. Reported on 09/23/2015 30 capsule   . Turmeric 500 MG CAPS Take 1 capsule by mouth daily.    Marland Kitchen UNABLE TO FIND Migra-Eeze     No current facility-administered medications on file prior to visit.      Objective:  Objective  Physical Exam   Constitutional: He is oriented to person, place, and time. Vital signs are normal. He appears well-developed and well-nourished. He is sleeping.  HENT:  Head: Normocephalic and atraumatic.  Mouth/Throat: Oropharynx is clear and moist.  Eyes: EOM are normal. Pupils are equal, round, and reactive to light.  Neck: Normal range of motion. Neck supple. No thyromegaly present.  Cardiovascular: Normal rate and regular rhythm.   No murmur heard. Pulmonary/Chest: Effort normal and breath sounds normal. No respiratory distress. He has no wheezes. He has no rales. He exhibits no tenderness.  Musculoskeletal: He exhibits no edema or tenderness.  Neurological: He is alert and oriented to person, place, and time.  Skin: Skin is warm and dry.  Psychiatric: He has a normal mood and affect. His behavior is normal. Judgment and thought content normal.  Nursing note and vitals reviewed. Sensory exam of the foot is normal, tested with the monofilament. Good pulses, no lesions or ulcers, good peripheral pulses.  BP 131/62   Pulse 62   Temp 97.8 F (36.6 C) (Oral)   Ht 5\' 9"  (1.753 m)   Wt 174 lb (78.9 kg)   SpO2 98%   BMI 25.70 kg/m  Wt Readings from Last 3 Encounters:  04/06/17 174 lb (78.9 kg)  01/17/17 180 lb 4 oz (81.8 kg)  01/05/17 180 lb 12.8 oz (82 kg)     Lab Results  Component Value Date   WBC 6.7 01/05/2017   HGB 15.9 01/05/2017   HCT 45.1 01/05/2017   PLT 158 01/05/2017   GLUCOSE 87 04/06/2017   CHOL 80 04/06/2017   TRIG 72.0 04/06/2017   HDL 37.60 (L) 04/06/2017   LDLCALC 28 04/06/2017   ALT 24 04/06/2017   AST 25 04/06/2017   NA 140 04/06/2017   K 4.7 04/06/2017   CL 106 04/06/2017   CREATININE 1.26 04/06/2017   BUN 28 (H) 04/06/2017   CO2 29 04/06/2017   TSH 1.29 04/06/2017   PSA 3.42 09/24/2015   INR 1.0 RATIO 12/11/2008   HGBA1C 5.1 04/06/2017   MICROALBUR 2.5 (H) 09/24/2015    Ct Temporal Bones Wo Contrast  Result Date: 03/02/2017 CLINICAL DATA:  Bilateral  mastoiditis.  Hearing loss. EXAM: CT TEMPORAL BONES WITHOUT CONTRAST TECHNIQUE: Axial and coronal plane CT imaging of the petrous temporal bones was performed with thin-collimation image reconstruction. No intravenous contrast was administered. Multiplanar CT image reconstructions were also generated. COMPARISON:  Maxillofacial CT 09/09/2016 FINDINGS: RIGHT: The external auditory canal is unremarkable. A tympanostomy tube has been placed. The ossicles appear intact. The tympanic cavity is clear. The internal auditory canal, cochlea, vestibule, and semicircular canals are unremarkable. A small mastoid effusion has not significantly changed with mild osseous sclerosis again noted at the level the mastoid tip. No  air cell coalescence or osseous erosion is seen. LEFT: The external auditory canal is unremarkable. A tympanostomy tube has been placed. The ossicles appear intact. The tympanic cavity is clear. There is osseous thinning versus dehiscence of the bony covering of the superior semicircular canal. The internal auditory canal, cochlea, vestibule, and semicircular canals are otherwise unremarkable. A small mastoid effusion has not significantly change with mild osteosclerosis again noted at the level of the mastoid tip. No air cell coalescence or osseous erosion is seen. Small left maxillary sinus mucous retention cyst. Rightward nasal septal deviation anteriorly with a small leftward projecting spur more posteriorly. The visualized portion of the brain is unremarkable. Visualized orbits are unremarkable. IMPRESSION: 1. Chronic bilateral mastoiditis with unchanged small volume fluid. No osseous erosion. 2. 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 Electronically Signed   By: Logan Bores M.D.   On: 03/02/2017 11:23     Assessment & Plan:  Plan  I have discontinued Mr. Wrightsman traMADol and simvastatin. I am also having him maintain his aspirin, Biotin, multivitamin, Fish Oil,  CoQ-10, Cinnamon, Ginger Root, Turmeric, magnesium, tamsulosin, fluticasone, ezetimibe, levothyroxine, metFORMIN, FEVERFEW PO, UNABLE TO FIND, propranolol, losartan-hydrochlorothiazide, atorvastatin, OVER THE COUNTER MEDICATION, and piroxicam.  Meds ordered this encounter  Medications  . OVER THE COUNTER MEDICATION    Sig: Migra-eeze: Take 1 gel capsule two times daily.  . piroxicam (FELDENE) 10 MG capsule    Sig: 1-2 daily prn    Dispense:  60 capsule    Refill:  2    Problem List Items Addressed This Visit      Unprioritized   CAD, NATIVE VESSEL    Per cardiology con't meds      DM (diabetes mellitus) type II uncontrolled, periph vascular disorder ([place]) - Primary    hgba1c to be done, minimize simple carbs. Increase exercise as tolerated. Continue current meds       Relevant Orders   Hemoglobin A1c (Completed)   Comprehensive metabolic panel (Completed)   Essential hypertension    Well controlled, no changes to meds. Encouraged heart healthy diet such as the DASH diet and exercise as tolerated.       Relevant Orders   Comprehensive metabolic panel (Completed)   Hyperlipidemia LDL goal <70    Tolerating statin, encouraged heart healthy diet, avoid trans fats, minimize simple carbs and saturated fats. Increase exercise as tolerated      Relevant Orders   Lipid panel (Completed)   Hypothyroidism    con't synthroid Check tsh      Relevant Orders   TSH (Completed)    Other Visit Diagnoses    Osteoarthritis of multiple joints, unspecified osteoarthritis type       Relevant Medications   piroxicam (FELDENE) 10 MG capsule   Migraine without status migrainosus, not intractable, unspecified migraine type       Relevant Medications   piroxicam (FELDENE) 10 MG capsule      Follow-up: Return in about 6 months (around 10/06/2017) for fasting.  [name], DO

## 2017-04-06 NOTE — Assessment & Plan Note (Signed)
hgba1c to be done, minimize simple carbs. Increase exercise as tolerated. Continue current meds  

## 2017-04-06 NOTE — Assessment & Plan Note (Signed)
Well controlled, no changes to meds. Encouraged heart healthy diet such as the DASH diet and exercise as tolerated.  °

## 2017-04-06 NOTE — Assessment & Plan Note (Signed)
Tolerating statin, encouraged heart healthy diet, avoid trans fats, minimize simple carbs and saturated fats. Increase exercise as tolerated 

## 2017-04-06 NOTE — Patient Instructions (Signed)

## 2017-04-06 NOTE — Assessment & Plan Note (Signed)
Per cardiology con't meds 

## 2017-04-10 ENCOUNTER — Other Ambulatory Visit: Payer: Self-pay

## 2017-04-10 DIAGNOSIS — E1151 Type 2 diabetes mellitus with diabetic peripheral angiopathy without gangrene: Secondary | ICD-10-CM

## 2017-04-10 DIAGNOSIS — IMO0002 Reserved for concepts with insufficient information to code with codable children: Secondary | ICD-10-CM

## 2017-04-10 DIAGNOSIS — E7849 Other hyperlipidemia: Secondary | ICD-10-CM

## 2017-04-10 DIAGNOSIS — E1165 Type 2 diabetes mellitus with hyperglycemia: Secondary | ICD-10-CM

## 2017-04-24 DIAGNOSIS — H25012 Cortical age-related cataract, left eye: Secondary | ICD-10-CM | POA: Diagnosis not present

## 2017-04-24 DIAGNOSIS — H04122 Dry eye syndrome of left lacrimal gland: Secondary | ICD-10-CM | POA: Diagnosis not present

## 2017-04-24 DIAGNOSIS — H532 Diplopia: Secondary | ICD-10-CM | POA: Diagnosis not present

## 2017-05-05 DIAGNOSIS — H6983 Other specified disorders of Eustachian tube, bilateral: Secondary | ICD-10-CM | POA: Diagnosis not present

## 2017-05-05 DIAGNOSIS — H6613 Chronic tubotympanic suppurative otitis media, bilateral: Secondary | ICD-10-CM | POA: Insufficient documentation

## 2017-05-09 ENCOUNTER — Encounter: Payer: Self-pay | Admitting: Family Medicine

## 2017-05-17 ENCOUNTER — Encounter (HOSPITAL_BASED_OUTPATIENT_CLINIC_OR_DEPARTMENT_OTHER): Payer: Self-pay | Admitting: Emergency Medicine

## 2017-05-17 ENCOUNTER — Emergency Department (HOSPITAL_BASED_OUTPATIENT_CLINIC_OR_DEPARTMENT_OTHER)
Admission: EM | Admit: 2017-05-17 | Discharge: 2017-05-18 | Disposition: A | Discharge status: Home / Self Care | Payer: Medicare Other | Attending: Emergency Medicine | Admitting: Emergency Medicine

## 2017-05-17 ENCOUNTER — Emergency Department (HOSPITAL_BASED_OUTPATIENT_CLINIC_OR_DEPARTMENT_OTHER): Payer: Medicare Other

## 2017-05-17 DIAGNOSIS — K5900 Constipation, unspecified: Secondary | ICD-10-CM | POA: Insufficient documentation

## 2017-05-17 DIAGNOSIS — R509 Fever, unspecified: Secondary | ICD-10-CM | POA: Insufficient documentation

## 2017-05-17 DIAGNOSIS — R51 Headache: Secondary | ICD-10-CM | POA: Diagnosis not present

## 2017-05-17 DIAGNOSIS — H6983 Other specified disorders of Eustachian tube, bilateral: Secondary | ICD-10-CM | POA: Diagnosis not present

## 2017-05-17 DIAGNOSIS — I10 Essential (primary) hypertension: Secondary | ICD-10-CM | POA: Insufficient documentation

## 2017-05-17 DIAGNOSIS — Z79899 Other long term (current) drug therapy: Secondary | ICD-10-CM | POA: Insufficient documentation

## 2017-05-17 DIAGNOSIS — Z7982 Long term (current) use of aspirin: Secondary | ICD-10-CM | POA: Diagnosis not present

## 2017-05-17 DIAGNOSIS — E119 Type 2 diabetes mellitus without complications: Secondary | ICD-10-CM | POA: Diagnosis not present

## 2017-05-17 DIAGNOSIS — Z87891 Personal history of nicotine dependence: Secondary | ICD-10-CM | POA: Diagnosis not present

## 2017-05-17 DIAGNOSIS — Z7984 Long term (current) use of oral hypoglycemic drugs: Secondary | ICD-10-CM | POA: Diagnosis not present

## 2017-05-17 DIAGNOSIS — Z8673 Personal history of transient ischemic attack (TIA), and cerebral infarction without residual deficits: Secondary | ICD-10-CM | POA: Diagnosis not present

## 2017-05-17 DIAGNOSIS — T50905A Adverse effect of unspecified drugs, medicaments and biological substances, initial encounter: Secondary | ICD-10-CM

## 2017-05-17 DIAGNOSIS — I2510 Atherosclerotic heart disease of native coronary artery without angina pectoris: Secondary | ICD-10-CM | POA: Diagnosis not present

## 2017-05-17 DIAGNOSIS — M2550 Pain in unspecified joint: Secondary | ICD-10-CM | POA: Insufficient documentation

## 2017-05-17 DIAGNOSIS — H6613 Chronic tubotympanic suppurative otitis media, bilateral: Secondary | ICD-10-CM | POA: Diagnosis not present

## 2017-05-17 DIAGNOSIS — T368X5A Adverse effect of other systemic antibiotics, initial encounter: Secondary | ICD-10-CM | POA: Diagnosis not present

## 2017-05-17 DIAGNOSIS — R079 Chest pain, unspecified: Secondary | ICD-10-CM | POA: Diagnosis not present

## 2017-05-17 HISTORY — DX: Other specified disorders of Eustachian tube, unspecified ear: H69.80

## 2017-05-17 HISTORY — DX: Chronic tubotympanic suppurative otitis media, bilateral: H66.13

## 2017-05-17 LAB — BASIC METABOLIC PANEL
ANION GAP: 8 (ref 5–15)
BUN: 23 mg/dL — ABNORMAL HIGH (ref 6–20)
CALCIUM: 8.8 mg/dL — AB (ref 8.9–10.3)
CHLORIDE: 102 mmol/L (ref 101–111)
CO2: 25 mmol/L (ref 22–32)
Creatinine, Ser: 1.66 mg/dL — ABNORMAL HIGH (ref 0.61–1.24)
GFR calc non Af Amer: 39 mL/min — ABNORMAL LOW (ref >60)
GFR, EST AFRICAN AMERICAN: 46 mL/min — AB (ref >60)
GLUCOSE: 116 mg/dL — AB (ref 65–99)
POTASSIUM: 4.2 mmol/L (ref 3.5–5.1)
Sodium: 135 mmol/L (ref 135–145)

## 2017-05-17 LAB — URINALYSIS, ROUTINE W REFLEX MICROSCOPIC
Bilirubin Urine: NEGATIVE
GLUCOSE, UA: NEGATIVE mg/dL
Hgb urine dipstick: NEGATIVE
Ketones, ur: 15 mg/dL — AB
LEUKOCYTES UA: NEGATIVE
NITRITE: NEGATIVE
PH: 5.5 (ref 5.0–8.0)
Protein, ur: NEGATIVE mg/dL
SPECIFIC GRAVITY, URINE: 1.027 (ref 1.005–1.030)

## 2017-05-17 MED ORDER — ACETAMINOPHEN 500 MG PO TABS
1000.0000 mg | ORAL_TABLET | Freq: Once | ORAL | Status: AC
  Administered 2017-05-17: 1000 mg via ORAL
  Filled 2017-05-17: qty 2

## 2017-05-17 MED ORDER — IBUPROFEN 800 MG PO TABS
800.0000 mg | ORAL_TABLET | Freq: Once | ORAL | Status: AC
  Administered 2017-05-17: 800 mg via ORAL
  Filled 2017-05-17: qty 1

## 2017-05-17 NOTE — ED Provider Notes (Addendum)
[name] DEPT [facility] Provider Note   CSN: [id number] Arrival date & time: 05/17/17  2133  By signing my name below, I, Marcello Moores, attest that this documentation has been prepared under the direction and in the presence of [place], Saharra Santo, MD. Electronically Signed: Marcello Moores, ED Scribe. 05/17/17. 11:43 PM.  History   Chief Complaint Chief Complaint  Patient presents with  . Joint Pain   The history is provided by the patient. No language interpreter was used.  Illness  This is a new problem. The current episode started more than 2 days ago. The problem occurs constantly. The problem has not changed since onset.Pertinent negatives include no chest pain, no abdominal pain, no headaches and no shortness of breath. Nothing aggravates the symptoms. Nothing relieves the symptoms. He has tried nothing for the symptoms. The treatment provided no relief.   HPI Comments: [name] is a 71 y.o. male who presents to the Emergency Department complaining of arthralgias, HA, and fever that began s/p taking Bactrim for a d/x of otalgia. The pt reports that he was given 30 pills, and took around 15 before he was advised by his physician to stop as this was likely an adverse reaction. Per spouse, the pt has associated chills, constipation, and myalgias. He otherwise has a h/x of HLD, HTN, migraines, otalgia and placement of ear tubes for chronic ear infection. He denies dysuria and diarrhea.   Past Medical History:  Diagnosis Date  . Allergy   . Arthritis   . CAD (coronary artery disease)    s/p PTCI in 2003 and 2004 to the LAD.   Marland Kitchen Chronic tubotympanic suppurative otitis media, bilateral   . Depression   . Diabetes mellitus   . Eustachian tube dysfunction   . History of placement of ear tubes   . Hyperlipidemia   . Hypertension   . Hypothyroid   . Internal hemorrhoids   . Migraine headache   . Skin cancer, basal cell   . Thrombocytopenia ([place])   . Thrombocytopenia,  unspecified ([place]) 05/08/2013  . TIA (transient ischemic attack) 2006   per patient's report.  He was never officially given this diagnosis    Patient Active Problem List   Diagnosis Date Noted  . Preventative health care 10/06/2016  . Other specified disorders of eustachian tube, bilateral 11/30/2015  . Bilateral sensorineural hearing loss 11/30/2015  . Acute sinusitis 03/26/2015  . Heartburn 09/16/2014  . Thrombocytopenia ([place]) 05/08/2013  . Hyperlipidemia LDL goal <70 07/12/2011  . INFLUENZA WITH OTHER MANIFESTATIONS 10/28/2010  . ABDOMINAL BRUIT 07/22/2010  . HYPERTHYROIDISM 07/12/2010  . CORONARY ARTERY DISEASE 07/12/2010  . HEMORRHOIDS-INTERNAL 07/12/2010  . RECTAL BLEEDING 07/12/2010  . BLOOD IN STOOL, OCCULT 07/12/2010  . PERSONAL HISTORY OF COLONIC POLYPS 07/12/2010  . BLOOD IN STOOL 07/08/2010  . DM (diabetes mellitus) type II uncontrolled, periph vascular disorder ([place]) 07/06/2010  . Essential hypertension 12/11/2008  . 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 CAD, NATIVE VESSEL 12/11/2008  . Hypothyroidism 07/10/2007  . Other and unspecified hyperlipidemia 07/10/2007  . DEPRESSION 07/10/2007  . OSTEOARTHRITIS 07/10/2007  . HEADACHE 07/10/2007    Past Surgical History:  Procedure Laterality Date  . COLONOSCOPY  2011  . coronary artery disease status post placement     of drug-eluting stent in the LAD in 2003,eEF 65% then  . fatty tissue removed     from neck 2003  . HERNIA REPAIR  2007  . POLYPECTOMY  2011   +TA  . right toe bone spur  surgery    . THYROIDECTOMY, PARTIAL  07/2002   right thyroid  . TONSILLECTOMY         Home Medications    Prior to Admission medications   Medication Sig Start Date End Date Taking? Authorizing Provider  aspirin 81 MG tablet Take 81 mg by mouth daily.      [provider]  atorvastatin (LIPITOR) 40 MG tablet Take 1 tablet (40 mg total) by mouth daily. 11/21/16 11/21/17  Lelon Perla, MD  Biotin 1000 MCG tablet Take  1,000 mcg by mouth daily.     [provider]  Cinnamon 500 MG capsule Take 1,000 mg by mouth 2 (two) times daily.     [provider]  Coenzyme Q10 (COQ-10) 100 MG capsule Take 100 mg by mouth 2 (two) times daily.     [provider]  ezetimibe (ZETIA) 10 MG tablet Take 1 tablet (10 mg total) by mouth daily. 10/06/16   [name], DO  FEVERFEW PO Take 1 capsule by mouth 3 (three) times daily.    [provider]  fluticasone (FLONASE) 50 MCG/ACT nasal spray Place 2 sprays into both nostrils daily. 10/06/16   [name], DO  Ginger, Zingiber officinalis, (GINGER ROOT) 550 MG CAPS Take 1 capsule by mouth daily.      [provider]  levothyroxine (SYNTHROID, LEVOTHROID) 112 MCG tablet Take 1 tablet (112 mcg total) by mouth daily. 10/06/16   [name], DO  losartan-hydrochlorothiazide (HYZAAR) 100-12.5 MG tablet Take 1 tablet by mouth daily. 11/21/16   Lelon Perla, MD  magnesium 30 MG tablet Take 30 mg by mouth 2 (two) times daily.    [provider]  metFORMIN (GLUMETZA) 500 MG (MOD) 24 hr tablet Take 1 tablet (500 mg total) by mouth 2 (two) times daily with a meal. 10/06/16   Carollee Herter, Alferd Apa, DO  multivitamin [facility]) per tablet Take 1 tablet by mouth daily.      [provider]  Omega-3 Fatty Acids (FISH OIL) 1200 MG CAPS Take 2 capsules by mouth 2 (two) times daily.      [provider]  OVER THE COUNTER MEDICATION Migra-eeze: Take 1 gel capsule two times daily.    [provider]  piroxicam (FELDENE) 10 MG capsule 1-2 daily prn 04/06/17   Carollee Herter, Alferd Apa, DO  propranolol (INDERAL) 80 MG tablet Take 1 tablet (80 mg total) by mouth 2 (two) times daily. 11/21/16   Lelon Perla, MD  tamsulosin (FLOMAX) 0.4 MG CAPS capsule Take 1 capsule (0.4 mg total) by mouth daily. Reported on 09/23/2015 03/24/16   [name], DO  Turmeric 500 MG CAPS Take 1 capsule by  mouth daily.    [provider]  UNABLE TO [place]    [provider]    Family History Family History  Problem Relation Age of Onset  . Mental illness Mother        alzheimers  . Heart disease Mother   . Heart disease Father        triple by pass  . Hypertension Brother        Prostate Cancer  . Prostate cancer Brother   . Hypertension Brother   . Hypertension Brother   . COPD Brother   . Colon cancer Neg Hx   . Colon polyps Neg Hx   . Rectal cancer Neg Hx   . Stomach cancer Neg Hx  Social History Social History  Substance Use Topics  . Smoking status: Former Smoker    Years: 5.00    Types: Cigarettes    Quit date: 10/10/1965  . Smokeless tobacco: Never Used  . Alcohol use No     Allergies   Codeine; Crestor [rosuvastatin calcium]; Hydrocodone-acetaminophen; Other; and Rosuvastatin   Review of Systems Review of Systems  Constitutional: Positive for chills and fever (101.74F).  HENT: Negative for congestion, dental problem, drooling, ear pain, facial swelling, sinus pain, trouble swallowing and voice change.   Eyes: Negative for photophobia.  Respiratory: Negative for cough, chest tightness and shortness of breath.   Cardiovascular: Negative for chest pain, palpitations and leg swelling.  Gastrointestinal: Positive for constipation. Negative for abdominal pain, diarrhea and vomiting.  Genitourinary: Negative for dysuria.  Musculoskeletal: Positive for arthralgias. Negative for back pain, myalgias, neck pain and neck stiffness.  Skin: Negative for color change and rash.  Neurological: Negative for headaches.  All other systems reviewed and are negative.    Physical Exam Updated Vital Signs BP 138/77 (BP Location: Right Arm)   Pulse 89   Temp 99.4 F (37.4 C) (Oral)   Resp 18   Ht 5\' 9"  (1.753 m)   Wt 169 lb (76.7 kg)   SpO2 98%   BMI 24.96 kg/m   Physical Exam  Constitutional: He is oriented to person, place, and time. He  appears well-developed and well-nourished.  HENT:  Head: Normocephalic and atraumatic.  Mouth/Throat: Uvula is midline, oropharynx is clear and moist and mucous membranes are normal. No oropharyngeal exudate.  Eyes: Pupils are equal, round, and reactive to light. Conjunctivae and EOM are normal.  Neck: Normal range of motion. Neck supple.  Cardiovascular: Normal rate, regular rhythm, normal heart sounds and intact distal pulses.  Exam reveals no gallop and no friction rub.   No murmur heard. Pulmonary/Chest: Effort normal and breath sounds normal. No stridor. No respiratory distress. He has no wheezes. He has no rales.  Abdominal: Soft. Bowel sounds are normal. He exhibits no distension and no mass. There is no tenderness. There is no rebound and no guarding.  Musculoskeletal: Normal range of motion. He exhibits no edema, tenderness or deformity.  Lymphadenopathy:    He has no cervical adenopathy.  Neurological: He is alert and oriented to person, place, and time. He displays normal reflexes.  No joint swelling nor tenderness  Skin: Skin is warm and dry. Capillary refill takes less than 2 seconds. No rash noted. No erythema.  No Janeway lesions, no Osler nodes, no splinter hemorrhages   Psychiatric: He has a normal mood and affect. Judgment normal.  Nursing note and vitals reviewed.    ED Treatments / Results    DIAGNOSTIC STUDIES: Oxygen Saturation is 98% on RA, normal by my interpretation.   COORDINATION OF CARE: 11:24 PM-Discussed next steps with pt. Pt verbalized understanding and is agreeable with the plan.   Labs (all labs ordered are listed, but only abnormal results are displayed) Results for orders placed or performed during the hospital encounter of 05/17/17  CBC with Differential/Platelet  Result Value Ref Range   WBC 6.6 4.0 - 10.5 K/uL   RBC 4.48 4.22 - 5.81 MIL/uL   Hemoglobin 14.5 13.0 - 17.0 g/dL   HCT 39.5 39.0 - 52.0 %   MCV 88.2 78.0 - 100.0 fL   MCH 32.4  26.0 - 34.0 pg   MCHC 36.7 (H) 30.0 - 36.0 g/dL   RDW 12.7 11.5 - 15.5 %  Platelets 107 (L) 150 - 400 K/uL   Neutrophils Relative % 81 %   Neutro Abs 5.3 1.7 - 7.7 K/uL   Lymphocytes Relative 6 %   Lymphs Abs 0.4 (L) 0.7 - 4.0 K/uL   Monocytes Relative 11 %   Monocytes Absolute 0.7 0.1 - 1.0 K/uL   Eosinophils Relative 2 %   Eosinophils Absolute 0.2 0.0 - 0.7 K/uL   Basophils Relative 0 %   Basophils Absolute 0.0 0.0 - 0.1 K/uL  Basic metabolic panel  Result Value Ref Range   Sodium 135 135 - 145 mmol/L   Potassium 4.2 3.5 - 5.1 mmol/L   Chloride 102 101 - 111 mmol/L   CO2 25 22 - 32 mmol/L   Glucose, Bld 116 (H) 65 - 99 mg/dL   BUN 23 (H) 6 - 20 mg/dL   Creatinine, Ser 1.66 (H) 0.61 - 1.24 mg/dL   Calcium 8.8 (L) 8.9 - 10.3 mg/dL   GFR calc non Af Amer 39 (L) >60 mL/min   GFR calc Af Amer 46 (L) >60 mL/min   Anion gap 8 5 - 15  Urinalysis, Routine w reflex microscopic  Result Value Ref Range   Color, Urine AMBER (A) YELLOW   APPearance CLEAR CLEAR   Specific Gravity, Urine 1.027 1.005 - 1.030   pH 5.5 5.0 - 8.0   Glucose, UA NEGATIVE NEGATIVE mg/dL   Hgb urine dipstick NEGATIVE NEGATIVE   Bilirubin Urine NEGATIVE NEGATIVE   Ketones, ur 15 (A) NEGATIVE mg/dL   Protein, ur NEGATIVE NEGATIVE mg/dL   Nitrite NEGATIVE NEGATIVE   Leukocytes, UA NEGATIVE NEGATIVE   Dg Chest 2 View  Result Date: 05/18/2017 CLINICAL DATA:  Acute onset of headache and fever. Chills, generalized chest pain and constipation. Myalgia. Initial encounter. EXAM: CHEST  2 VIEW COMPARISON:  Chest radiograph performed 03/31/2015 FINDINGS: The lungs are well-aerated and clear. There is no evidence of focal opacification, pleural effusion or pneumothorax. The heart is normal in size; the mediastinal contour is within normal limits. No acute osseous abnormalities are seen. IMPRESSION: No acute cardiopulmonary process seen. Electronically Signed   By: Garald Balding M.D.   On: 05/18/2017 01:14     Radiology No results found.  Procedures Procedures (including critical care time)  Medications Ordered in ED  Medications  acetaminophen (TYLENOL) tablet 1,000 mg (1,000 mg Oral Given 05/17/17 2338)  ibuprofen (ADVIL,MOTRIN) tablet 800 mg (800 mg Oral Given 05/17/17 2338)     Final Clinical Impressions(s) / ED Diagnoses  While this could be an infection, patient has no PNA nor UTI and has no other systemic signs of bacterial illness.  It could be a viral infection but I am suspicious of an adverse drug reaction caused by bactrim as this started early in the course of treatment and the patient has been on antibiotics to cover for this ongoing ear infection.  The patient has no tachycardia, no rashes on the skin, no cough, and has been on several rounds of antibiotics making systemic infection very unlikely.  There are no signs or occult infection, ie there are no murmurs.    The patient is very well appearing and has been observed in the ED.  Strict return precautions given for facial swelling, fevers > 101 for more than a day, drooling, swelling of the mouth or throat, vomiting, weakness, inability to tolerate oral liquids or foods, shortness of breath, changes in vision or thinking, chest pain, dyspnea on exertion, weakness or numbness or any concerns. No  signs of systemic illness or infection. The patient is nontoxic-appearing on exam and vital signs are within normal limits. He is advised to take tylenol for pain and drink copious liquids and follow up with his PMD in 2 days and have his creatinine rechecked and also for reevaluation.  Return immediately for worsening or new symptoms.     I have reviewed the triage vital signs and the nursing notes. Pertinent labs &imaging results that were available during my care of the patient were reviewed by me and considered in my medical decision making (see chart for details).  After history, exam, and medical workup I feel the patient has been  appropriately medically screened and is safe for discharge home. Pertinent diagnoses were discussed with the patient. Patient was given return precautions.  I personally performed the services described in this documentation, which was scribed in my presence. The recorded information has been reviewed and is accurate.      Aadith Raudenbush, MD 05/18/17 [place], Torien Ramroop, MD 05/18/17 2302

## 2017-05-17 NOTE — ED Triage Notes (Signed)
C/o joint pain, weakness, HA, fever-was started on septra for ear infection last week-was seen by PCP today-was advised sx r/t to reaction to abx and to stop abx-no further-NAD-presents to triage in w/c

## 2017-05-18 DIAGNOSIS — R079 Chest pain, unspecified: Secondary | ICD-10-CM | POA: Diagnosis not present

## 2017-05-18 LAB — CBC WITH DIFFERENTIAL/PLATELET
Basophils Absolute: 0 10*3/uL (ref 0.0–0.1)
Basophils Relative: 0 %
EOS PCT: 2 %
Eosinophils Absolute: 0.2 10*3/uL (ref 0.0–0.7)
HCT: 39.5 % (ref 39.0–52.0)
Hemoglobin: 14.5 g/dL (ref 13.0–17.0)
LYMPHS ABS: 0.4 10*3/uL — AB (ref 0.7–4.0)
LYMPHS PCT: 6 %
MCH: 32.4 pg (ref 26.0–34.0)
MCHC: 36.7 g/dL — ABNORMAL HIGH (ref 30.0–36.0)
MCV: 88.2 fL (ref 78.0–100.0)
MONO ABS: 0.7 10*3/uL (ref 0.1–1.0)
Monocytes Relative: 11 %
Neutro Abs: 5.3 10*3/uL (ref 1.7–7.7)
Neutrophils Relative %: 81 %
PLATELETS: 107 10*3/uL — AB (ref 150–400)
RBC: 4.48 MIL/uL (ref 4.22–5.81)
RDW: 12.7 % (ref 11.5–15.5)
WBC: 6.6 10*3/uL (ref 4.0–10.5)

## 2017-05-18 NOTE — ED Notes (Addendum)
Patient transported to X-ray 

## 2017-05-22 ENCOUNTER — Encounter: Payer: Self-pay | Admitting: Family Medicine

## 2017-05-22 ENCOUNTER — Ambulatory Visit (INDEPENDENT_AMBULATORY_CARE_PROVIDER_SITE_OTHER): Payer: Medicare Other | Admitting: Family Medicine

## 2017-05-22 VITALS — BP 118/72 | HR 70 | Temp 97.6°F | Ht 69.0 in | Wt 170.1 lb

## 2017-05-22 DIAGNOSIS — R7989 Other specified abnormal findings of blood chemistry: Secondary | ICD-10-CM | POA: Diagnosis not present

## 2017-05-22 DIAGNOSIS — I2510 Atherosclerotic heart disease of native coronary artery without angina pectoris: Secondary | ICD-10-CM | POA: Diagnosis not present

## 2017-05-22 DIAGNOSIS — T7840XA Allergy, unspecified, initial encounter: Secondary | ICD-10-CM | POA: Diagnosis not present

## 2017-05-22 LAB — POC URINALSYSI DIPSTICK (AUTOMATED)
BILIRUBIN UA: NEGATIVE
GLUCOSE UA: NEGATIVE
KETONES UA: NEGATIVE
Leukocytes, UA: NEGATIVE
Nitrite, UA: NEGATIVE
Protein, UA: NEGATIVE
RBC UA: NEGATIVE
SPEC GRAV UA: 1.025 (ref 1.010–1.025)
Urobilinogen, UA: 0.2 E.U./dL
pH, UA: 6 (ref 5.0–8.0)

## 2017-05-22 MED ORDER — RANITIDINE HCL 300 MG PO TABS: 300.0000 mg | ORAL_TABLET | Freq: Every day | ORAL | 2 refills | Status: DC

## 2017-05-22 NOTE — Progress Notes (Signed)
Pre visit review using our clinic review tool, if applicable. No additional management support is needed unless otherwise documented below in the visit note. 

## 2017-05-22 NOTE — Patient Instructions (Signed)
Anaphylactic Reaction An anaphylactic reaction (anaphylaxis) is a sudden allergic reaction that is very bad (severe). It also affects more than one part of the body. This condition can be life-threatening. If you have an anaphylactic reaction, you need to get medical help right away. You may need to stay in the hospital. Your doctor may teach you how to use an allergy kit (anaphylaxis kit) and how to give yourself an allergy shot (epinephrine injection). You can give yourself an allergy shot with what is commonly called an auto-injector "pen." Symptoms of an anaphylactic reaction may include:  A stuffy nose (nasal congestion).  Headache.  Tingling in your mouth.  A flushed face.  An itchy, red rash.  Swelling of your eyes, lips, face, or tongue.  Swelling of the back of your mouth and your throat.  Breathing loudly (wheezing).  A hoarse voice.  Itchy, red, swollen areas of skin (hives).  Dizziness or light-headedness.  Passing out (fainting).  Feeling worried or nervous (anxiety).  Feeling confused.  Pain in your belly (abdomen) or chest.  Trouble with breathing, talking, or swallowing.  A tight feeling in your chest or throat.  Fast or uneven heartbeats (palpitations).  Throwing up (vomiting).  Watery poop (diarrhea).  Follow these instructions at home: Safety  Always keep an auto-injector pen or your allergy kit with you. These could save your life. Use them as told by your doctor.  Do not drive until your doctor says that it is safe.  Make sure that you, the people who live with you, and your employer know: ? How to use your allergy kit. ? How to use an auto-injector pen to give you an allergy shot.  If you used your auto-injector pen: ? Get more medicine for it right away. This is important in case you have another reaction. ? Get help right away.  Wear a bracelet or necklace that says you have an allergy, if your doctor tells you to do this.  Learn  the signs of a very bad allergic reaction.  Work with your doctors to make a plan for what to do if you have a very bad allergic reaction. Being prepared is important. General instructions  Take over-the-counter and prescription medicines only as told by your doctor.  If you have itchy, red, swollen areas of skin or a rash: ? Use over-the-counter medicine (antihistamine) as told by your doctor. ? Put cold, wet cloths (cold compresses) on your skin. ? Take baths or showers in cool water. Avoid hot water.  If you had tests done, it is up to you to get your test results. Ask your doctor when your results will be ready.  Tell any doctors who care for you that you have an allergy.  Keep all follow-up visits as told by your doctor. This is important. How is this prevented?  Avoid things (allergens) that gave you a very bad allergic reaction before.  If you have a food allergy and you go to a restaurant, tell your server about your allergy. If you are not sure if your meal was made with food that you are allergic to, ask your server before you eat it. Contact a doctor if:  You have symptoms of an allergic reaction. You may notice them soon after being around whatever it is that you are allergic to. Symptoms may include: ? A rash. ? A headache. ? Sneezing or a runny nose. ? Swelling. ? Feeling sick to your stomach. ? Watery poop. Get help right   away if:  You had to use your auto-injector pen. You must go to the emergency room even if the medicine seems to be working.  You have any of these: ? A tight feeling in your chest or your throat. ? Loud breathing. ? Trouble with breathing. ? Itchy, red, swollen areas of skin. ? Red skin or itching all over your body. ? Swelling in your lips, tongue, or the back of your throat.  You have throwing up that gets very bad.  You have watery poop that gets very bad.  You pass out or feel like you might pass out. These symptoms may be an  emergency. Do not wait to see if the symptoms will go away. Use your auto-injector pen or allergy kit as you have been told. Get medical help right away. Call your local emergency services (911 in the [place] Do not drive yourself to the hospital. Summary  An anaphylactic reaction (anaphylaxis) is a sudden allergic reaction that is very bad (severe).  This condition can be life-threatening. If you have an anaphylactic reaction, you need to get medical help right away.  Your doctor may teach you how to use an allergy kit (anaphylaxis kit) and how to give yourself an allergy shot (epinephrine injection) with an auto-injector "pen."  Always keep an auto-injector pen or your allergy kit with you. These could save your life. Use them as told by your doctor.  If you had to use your auto-injector pen, you must go to the emergency room even if the medicine seems to be working. This information is not intended to replace advice given to you by your health care provider. Make sure you discuss any questions you have with your health care provider. Document Released: 03/14/2008 Document Revised: 05/20/2016 Document Reviewed: 05/20/2016 Elsevier Interactive Patient Education  2017 [employer]  

## 2017-05-22 NOTE — Assessment & Plan Note (Signed)
Improving  con't benadryl and add zantac Will not give prednisone since symptoms seem to be resolving

## 2017-05-22 NOTE — Progress Notes (Signed)
Patient ID: HEMAN QUE, male    DOB: 06-Feb-1946  Age: 71 y.o. MRN: [id number]    Subjective:  Subjective  HPI [name] presents for f/u from er due to allergic reaction to bactrim ds.  Pt had swelling lips and throat.  Er gave him benadryl and watched him.  He is better today.  No sob, cp, n/v.  Review of Systems  Constitutional: Negative for appetite change, diaphoresis, fatigue and unexpected weight change.  Eyes: Negative for pain, redness and visual disturbance.  Respiratory: Negative for cough, chest tightness, shortness of breath and wheezing.   Cardiovascular: Negative for chest pain, palpitations and leg swelling.  Endocrine: Negative for cold intolerance, heat intolerance, polydipsia, polyphagia and polyuria.  Genitourinary: Negative for difficulty urinating, dysuria and frequency.  Neurological: Negative for dizziness, light-headedness, numbness and headaches.    History Past Medical History:  Diagnosis Date  . Allergy   . Arthritis   . CAD (coronary artery disease)    s/p PTCI in 2003 and 2004 to the LAD.   Marland Kitchen Chronic tubotympanic suppurative otitis media, bilateral   . Depression   . Diabetes mellitus   . Eustachian tube dysfunction   . History of placement of ear tubes   . Hyperlipidemia   . Hypertension   . Hypothyroid   . Internal hemorrhoids   . Migraine headache   . Skin cancer, basal cell   . Thrombocytopenia ([place])   . Thrombocytopenia, unspecified ([place]) 05/08/2013  . TIA (transient ischemic attack) 2006   per patient's report.  He was never officially given this diagnosis    He has a past surgical history that includes Hernia repair (2007); fatty tissue removed; Thyroidectomy, partial (07/2002); coronary artery disease status post placement; right toe bone spur surgery; Tonsillectomy; Colonoscopy (2011); and Polypectomy (2011).   His family history includes COPD in his brother; Heart disease in his father and mother; Hypertension in his brother,  brother, and brother; Mental illness in his mother; Prostate cancer in his brother.He reports that he quit smoking about 51 years ago. His smoking use included Cigarettes. He quit after 5.00 years of use. He has never used smokeless tobacco. He reports that he does not drink alcohol or use drugs.  Current Outpatient Prescriptions on File Prior to Visit  Medication Sig Dispense Refill  . aspirin 81 MG tablet Take 81 mg by mouth daily.      Marland Kitchen atorvastatin (LIPITOR) 40 MG tablet Take 1 tablet (40 mg total) by mouth daily. 30 tablet 11  . Biotin 1000 MCG tablet Take 1,000 mcg by mouth daily.     . Cinnamon 500 MG capsule Take 1,000 mg by mouth 2 (two) times daily.     . Coenzyme Q10 (COQ-10) 100 MG capsule Take 100 mg by mouth 2 (two) times daily.     Marland Kitchen ezetimibe (ZETIA) 10 MG tablet Take 1 tablet (10 mg total) by mouth daily.    Marland Kitchen FEVERFEW PO Take 1 capsule by mouth 3 (three) times daily.    . fluticasone (FLONASE) 50 MCG/ACT nasal spray Place 2 sprays into both nostrils daily. 16 g 5  . Ginger, Zingiber officinalis, (GINGER ROOT) 550 MG CAPS Take 1 capsule by mouth daily.      Marland Kitchen levothyroxine (SYNTHROID, LEVOTHROID) 112 MCG tablet Take 1 tablet (112 mcg total) by mouth daily.    Marland Kitchen losartan-hydrochlorothiazide (HYZAAR) 100-12.5 MG tablet Take 1 tablet by mouth daily. 30 tablet 11  . magnesium 30 MG tablet Take 30 mg  by mouth 2 (two) times daily.    . metFORMIN (GLUMETZA) 500 MG (MOD) 24 hr tablet Take 1 tablet (500 mg total) by mouth 2 (two) times daily with a meal.    . multivitamin (THERAGRAN) per tablet Take 1 tablet by mouth daily.      . Omega-3 Fatty Acids (FISH OIL) 1200 MG CAPS Take 2 capsules by mouth 2 (two) times daily.      Marland Kitchen OVER THE COUNTER MEDICATION Migra-eeze: Take 1 gel capsule two times daily.    . piroxicam (FELDENE) 10 MG capsule 1-2 daily prn 60 capsule 2  . propranolol (INDERAL) 80 MG tablet Take 1 tablet (80 mg total) by mouth 2 (two) times daily. 60 tablet 11  . tamsulosin  (FLOMAX) 0.4 MG CAPS capsule Take 1 capsule (0.4 mg total) by mouth daily. Reported on 09/23/2015 30 capsule   . Turmeric 500 MG CAPS Take 1 capsule by mouth daily.    Marland Kitchen UNABLE TO FIND Migra-Eeze     No current facility-administered medications on file prior to visit.      Objective:  Objective  Physical Exam  Constitutional: He is oriented to person, place, and time. Vital signs are normal. He appears well-developed and well-nourished. He is sleeping.  HENT:  Head: Normocephalic and atraumatic.  Mouth/Throat: Oropharynx is clear and moist.  Eyes: Pupils are equal, round, and reactive to light. EOM are normal.  Neck: Normal range of motion. Neck supple. No thyromegaly present.  Cardiovascular: Normal rate and regular rhythm.   No murmur heard. Pulmonary/Chest: Effort normal and breath sounds normal. No respiratory distress. He has no wheezes. He has no rales. He exhibits no tenderness.  Musculoskeletal: He exhibits no edema or tenderness.  Neurological: He is alert and oriented to person, place, and time.  Skin: Skin is warm and dry.  Psychiatric: He has a normal mood and affect. His behavior is normal. Judgment and thought content normal.  Nursing note and vitals reviewed.  BP 118/72 (BP Location: Left Arm, Patient Position: Sitting, Cuff Size: Normal)   Pulse 70   Temp 97.6 F (36.4 C) (Oral)   Ht 5\' 9"  (1.753 m)   Wt 170 lb 2 oz (77.2 kg)   SpO2 98%   BMI 25.12 kg/m  Wt Readings from Last 3 Encounters:  05/22/17 170 lb 2 oz (77.2 kg)  05/17/17 169 lb (76.7 kg)  04/06/17 174 lb (78.9 kg)     Lab Results  Component Value Date   WBC 6.6 05/17/2017   HGB 14.5 05/17/2017   HCT 39.5 05/17/2017   PLT 107 (L) 05/17/2017   GLUCOSE 116 (H) 05/17/2017   CHOL 80 04/06/2017   TRIG 72.0 04/06/2017   HDL 37.60 (L) 04/06/2017   LDLCALC 28 04/06/2017   ALT 24 04/06/2017   AST 25 04/06/2017   NA 135 05/17/2017   K 4.2 05/17/2017   CL 102 05/17/2017   CREATININE 1.66 (H)  05/17/2017   BUN 23 (H) 05/17/2017   CO2 25 05/17/2017   TSH 1.29 04/06/2017   PSA 3.42 09/24/2015   INR 1.0 RATIO 12/11/2008   HGBA1C 5.1 04/06/2017   MICROALBUR 2.5 (H) 09/24/2015    Dg Chest 2 View  Result Date: 05/18/2017 CLINICAL DATA:  Acute onset of headache and fever. Chills, generalized chest pain and constipation. Myalgia. Initial encounter. EXAM: CHEST  2 VIEW COMPARISON:  Chest radiograph performed 03/31/2015 FINDINGS: The lungs are well-aerated and clear. There is no evidence of focal opacification, pleural effusion or  pneumothorax. The heart is normal in size; the mediastinal contour is within normal limits. No acute osseous abnormalities are seen. IMPRESSION: No acute cardiopulmonary process seen. Electronically Signed   By: Garald Balding M.D.   On: 05/18/2017 01:14     Assessment & Plan:  Plan  I am having Mr. Groesbeck start on ranitidine. I am also having him maintain his aspirin, Biotin, multivitamin, Fish Oil, CoQ-10, Cinnamon, Ginger Root, Turmeric, magnesium, tamsulosin, fluticasone, ezetimibe, levothyroxine, metFORMIN, FEVERFEW PO, UNABLE TO FIND, propranolol, losartan-hydrochlorothiazide, atorvastatin, OVER THE COUNTER MEDICATION, and piroxicam.  Meds ordered this encounter  Medications  . ranitidine (ZANTAC) 300 MG tablet    Sig: Take 1 tablet (300 mg total) by mouth at bedtime.    Dispense:  30 tablet    Refill:  2    Problem List Items Addressed This Visit      Unprioritized   Allergic reaction    Improving  con't benadryl and add zantac Will not give prednisone since symptoms seem to be resolving       Relevant Medications   ranitidine (ZANTAC) 300 MG tablet    Other Visit Diagnoses    Elevated serum creatinine    -  Primary   Relevant Orders   Basic metabolic panel   POCT Urinalysis Dipstick (Automated) (Completed)      Follow-up: Return if symptoms worsen or fail to improve.  [name], DO

## 2017-05-23 ENCOUNTER — Other Ambulatory Visit: Payer: Self-pay | Admitting: Family Medicine

## 2017-05-23 DIAGNOSIS — N289 Disorder of kidney and ureter, unspecified: Secondary | ICD-10-CM

## 2017-05-23 LAB — BASIC METABOLIC PANEL
BUN: 27 mg/dL — AB (ref 6–23)
CALCIUM: 8.9 mg/dL (ref 8.4–10.5)
CHLORIDE: 105 meq/L (ref 96–112)
CO2: 29 mEq/L (ref 19–32)
CREATININE: 1.37 mg/dL (ref 0.40–1.50)
GFR: 54.08 mL/min — AB (ref >60.00)
Glucose, Bld: 101 mg/dL — ABNORMAL HIGH (ref 70–99)
Potassium: 4.5 mEq/L (ref 3.5–5.1)
Sodium: 139 mEq/L (ref 135–145)

## 2017-06-13 ENCOUNTER — Other Ambulatory Visit (INDEPENDENT_AMBULATORY_CARE_PROVIDER_SITE_OTHER): Payer: Medicare Other

## 2017-06-13 DIAGNOSIS — N289 Disorder of kidney and ureter, unspecified: Secondary | ICD-10-CM

## 2017-06-14 LAB — BASIC METABOLIC PANEL
BUN: 24 mg/dL — AB (ref 6–23)
CHLORIDE: 104 meq/L (ref 96–112)
CO2: 29 meq/L (ref 19–32)
Calcium: 9.1 mg/dL (ref 8.4–10.5)
Creatinine, Ser: 1.27 mg/dL (ref 0.40–1.50)
GFR: 59.02 mL/min — ABNORMAL LOW (ref >60.00)
Glucose, Bld: 127 mg/dL — ABNORMAL HIGH (ref 70–99)
POTASSIUM: 4.1 meq/L (ref 3.5–5.1)
Sodium: 141 mEq/L (ref 135–145)

## 2017-06-16 ENCOUNTER — Other Ambulatory Visit: Payer: Self-pay | Admitting: Family Medicine

## 2017-06-16 ENCOUNTER — Encounter: Payer: Self-pay | Admitting: Family Medicine

## 2017-06-16 DIAGNOSIS — R7309 Other abnormal glucose: Secondary | ICD-10-CM

## 2017-06-26 ENCOUNTER — Other Ambulatory Visit (INDEPENDENT_AMBULATORY_CARE_PROVIDER_SITE_OTHER): Payer: Medicare Other

## 2017-06-26 DIAGNOSIS — R7309 Other abnormal glucose: Secondary | ICD-10-CM

## 2017-06-26 LAB — BASIC METABOLIC PANEL
BUN: 21 mg/dL (ref 6–23)
CALCIUM: 9 mg/dL (ref 8.4–10.5)
CHLORIDE: 104 meq/L (ref 96–112)
CO2: 29 meq/L (ref 19–32)
CREATININE: 1.28 mg/dL (ref 0.40–1.50)
GFR: 58.48 mL/min — ABNORMAL LOW (ref >60.00)
Glucose, Bld: 106 mg/dL — ABNORMAL HIGH (ref 70–99)
Potassium: 3.8 mEq/L (ref 3.5–5.1)
Sodium: 138 mEq/L (ref 135–145)

## 2017-06-26 LAB — HEMOGLOBIN A1C: Hgb A1c MFr Bld: 5.3 % (ref 4.6–6.5)

## 2017-06-30 ENCOUNTER — Other Ambulatory Visit: Payer: Medicare Other

## 2017-07-03 DIAGNOSIS — H6613 Chronic tubotympanic suppurative otitis media, bilateral: Secondary | ICD-10-CM | POA: Diagnosis not present

## 2017-07-23 DIAGNOSIS — Z23 Encounter for immunization: Secondary | ICD-10-CM | POA: Diagnosis not present

## 2017-07-24 ENCOUNTER — Telehealth: Payer: Self-pay | Admitting: Family Medicine

## 2017-07-24 MED ORDER — ZOSTER VAC RECOMB ADJUVANTED 50 MCG/0.5ML IM SUSR: 0.5000 mL | Freq: Once | INTRAMUSCULAR | 0 refills | Status: DC

## 2017-07-24 NOTE — Telephone Encounter (Signed)
Sent in as requested/had already spoken to his wife regarding this same request for her. She did state the pharmacy [facility] preferred a script to be sent in. So did as requested. Patient is aware

## 2017-07-24 NOTE — Telephone Encounter (Signed)
[name] is requesting that an order for the 2 part Shingles shot to the above pharmacy. They said they have it and would be glad to give them the injections Please call when done.

## 2017-07-25 ENCOUNTER — Other Ambulatory Visit: Payer: Self-pay | Admitting: Family Medicine

## 2017-07-25 ENCOUNTER — Ambulatory Visit (INDEPENDENT_AMBULATORY_CARE_PROVIDER_SITE_OTHER): Payer: Medicare Other | Admitting: Neurology

## 2017-07-25 ENCOUNTER — Encounter: Payer: Self-pay | Admitting: Neurology

## 2017-07-25 VITALS — BP 122/68 | HR 64 | Ht 69.0 in | Wt 174.0 lb

## 2017-07-25 DIAGNOSIS — R0683 Snoring: Secondary | ICD-10-CM | POA: Diagnosis not present

## 2017-07-25 DIAGNOSIS — G4719 Other hypersomnia: Secondary | ICD-10-CM

## 2017-07-25 DIAGNOSIS — G250 Essential tremor: Secondary | ICD-10-CM

## 2017-07-25 DIAGNOSIS — G43109 Migraine with aura, not intractable, without status migrainosus: Secondary | ICD-10-CM | POA: Diagnosis not present

## 2017-07-25 DIAGNOSIS — I2510 Atherosclerotic heart disease of native coronary artery without angina pectoris: Secondary | ICD-10-CM

## 2017-07-25 MED FILL — SHINGRIX 50 MCG SUS: 50 | 1 days supply | Qty: 1 | Fill #0

## 2017-07-25 NOTE — Patient Instructions (Addendum)
1.  Continue propranolol 80mg  twice daily 2.  Use the ginger tea and rest when you have a migraine 3.  I will refer you to [facility] for evaluation of sleep apnea 4.  Follow up in 6 months.

## 2017-07-25 NOTE — Progress Notes (Signed)
NEUROLOGY FOLLOW UP OFFICE NOTE  [name] [id number]  HISTORY OF PRESENT ILLNESS: [name] is a 71 year old right-handed male with osteoarthritis, hypertension, type 2 diabetes and hyperlipidemia who follows up for migraine and essential tremor.   UPDATE:  Migraine: Intensity:  Moderate to severe Duration:  Reduced intensity from severe to mild-moderate within an hour but lingers for a few hours Frequency:  3 migraines over past 6 months. Current NSAIDS:  piramxicam daily (for arthritis but seems to help headache) Current analgesics: He has not used tramadol.  Instead, he treats acutely with ginger tea Current triptans:  no Current anti-emetic:  no Current muscle relaxants:  no Current anti-anxiolytic:  no Current sleep aide:  no Current Antihypertensive medications:  losartain-HCTZ, propranolol 80mg  twice daily Current Antidepressant medications:  no Current Anticonvulsant medications:  no Current Vitamins/Herbal/Supplements:  Biotin, MVI, fish oil, coenzyme Q10, cinnamon, ginger root, turmeric 500mg , magnesium 250mg , riboflavin 400mg , butterbur 150mg  Current Antihistamines/Decongestants:  Flonase Other therapy:  Treats acutely with ginger tea and rest.   Caffeine:  2-3 cups coffee daily Alcohol:  no Smoker:  no Diet:  Hydrates.  No soda Exercise:  yes Depression/stress:  stable Sleep hygiene:  He wakes up every hour to urinate.  His wife states that he frequently snores and possibly with brief pauses.  He has excessive daytime sleepiness and will easily fall asleep on the couch while watching TV.   Essential Tremor:  He is taking propranolol 80mg  twice daily.  No change since last visit.  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   HISTORY: Migraine: Onset:  40-50 years ago.  They would occur once or twice a year.  In September, they started to occur once or twice a week and then almost daily.  Over the past couple of weeks (following tympanostomy  tube placement), they have occurred about once a week. Location:  Top of head Quality:  explode Initial Intensity:  10/10 Aura:  Flashing lights/colors in vision Prodrome:  no Associated symptoms:  Nausea, photophobia, phonophobia Initial Duration:  Several hours Initial Frequency:   In September, they started to occur once or twice a week and then almost daily.  Over the past couple of weeks (following tympanostomy tube placement), they have occurred about once a week. Triggers/exacerbating factors:  Stress, sound, NTG, light Relieving factors:  sleep Activity:  Needs to lay down   Past NSAIDS:  Celebrex Past analgesics:  no Past abortive triptans:  Sumatriptan (stopped due to CAD). Past muscle relaxants:  no Past anti-emetic:  no Past antihypertensive medications:  Propranolol (many years ago) Past antidepressant medications:  Prozac, amitriptyline Past anticonvulsant medications:  no Past vitamins/Herbal/Supplements:  no Other past therapies:  Biofeedback, relaxation therapy   MRI of brain without contrast from 08/11/16 was personally reviewed and revealed mild chronic small vessel ischemic changes but no acute stroke, bleed or mass lesion.  However, it did reveal mild mastoiditis.  He saw ENT and had tympanostomy tube placement on 09/22/16.     Essential Tremor: For over 10 years, he has had a tremor, particularly in his right hand.  It has become more noticeable.  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  There is no known family history.  PAST MEDICAL HISTORY: Past Medical History:  Diagnosis Date  . Allergy   . Arthritis   . CAD (coronary artery disease)    s/p PTCI in 2003 and 2004 to the LAD.   Marland Kitchen  Chronic tubotympanic suppurative otitis media, bilateral   . Depression   . Diabetes mellitus   . Eustachian tube dysfunction   . History of placement of ear tubes   . Hyperlipidemia   . Hypertension   . Hypothyroid   . Internal hemorrhoids   .  Migraine headache   . Skin cancer, basal cell   . Thrombocytopenia ([place])   . Thrombocytopenia, unspecified ([place]) 05/08/2013  . TIA (transient ischemic attack) 2006   per patient's report.  He was never officially given this diagnosis    MEDICATIONS: Current Outpatient Prescriptions on File Prior to Visit  Medication Sig Dispense Refill  . aspirin 81 MG tablet Take 81 mg by mouth daily.      Marland Kitchen atorvastatin (LIPITOR) 40 MG tablet Take 1 tablet (40 mg total) by mouth daily. 30 tablet 11  . Biotin 1000 MCG tablet Take 1,000 mcg by mouth daily.     . Cinnamon 500 MG capsule Take 1,000 mg by mouth 2 (two) times daily.     . Coenzyme Q10 (COQ-10) 100 MG capsule Take 100 mg by mouth 2 (two) times daily.     Marland Kitchen ezetimibe (ZETIA) 10 MG tablet Take 1 tablet (10 mg total) by mouth daily.    Marland Kitchen FEVERFEW PO Take 1 capsule by mouth 3 (three) times daily.    . fluticasone (FLONASE) 50 MCG/ACT nasal spray Place 2 sprays into both nostrils daily. 16 g 5  . Ginger, Zingiber officinalis, (GINGER ROOT) 550 MG CAPS Take 1 capsule by mouth daily.      Marland Kitchen levothyroxine (SYNTHROID, LEVOTHROID) 112 MCG tablet Take 1 tablet (112 mcg total) by mouth daily.    Marland Kitchen losartan-hydrochlorothiazide (HYZAAR) 100-12.5 MG tablet Take 1 tablet by mouth daily. 30 tablet 11  . magnesium 30 MG tablet Take 30 mg by mouth 2 (two) times daily.    . metFORMIN (GLUMETZA) 500 MG (MOD) 24 hr tablet Take 1 tablet (500 mg total) by mouth 2 (two) times daily with a meal.    . multivitamin (THERAGRAN) per tablet Take 1 tablet by mouth daily.      . Omega-3 Fatty Acids (FISH OIL) 1200 MG CAPS Take 2 capsules by mouth 2 (two) times daily.      Marland Kitchen OVER THE COUNTER MEDICATION Migra-eeze: Take 1 gel capsule two times daily.    . piroxicam (FELDENE) 10 MG capsule 1-2 daily prn 60 capsule 2  . propranolol (INDERAL) 80 MG tablet Take 1 tablet (80 mg total) by mouth 2 (two) times daily. 60 tablet 11  . ranitidine (ZANTAC) 300 MG tablet Take 1 tablet (300  mg total) by mouth at bedtime. (Patient not taking: Reported on 07/25/2017) 30 tablet 2  . tamsulosin (FLOMAX) 0.4 MG CAPS capsule Take 1 capsule (0.4 mg total) by mouth daily. Reported on 09/23/2015 30 capsule   . Turmeric 500 MG CAPS Take 1 capsule by mouth daily.    Marland Kitchen UNABLE TO FIND Migra-Eeze     No current facility-administered medications on file prior to visit.     ALLERGIES: Allergies  Allergen Reactions  . Sulfa Antibiotics Other (See Comments)  . Sulfamethoxazole-Trimethoprim Nausea And Vomiting  . Codeine   . Crestor [Rosuvastatin Calcium]   . Hydrocodone-Acetaminophen     vicodin   . Other     Apples and tomatoes...... Nausea and migraines.    . Rosuvastatin     REACTION: REALLY BAD JOINT/MUSCLE PAIN    FAMILY HISTORY: Family History  Problem Relation Age of  Onset  . Mental illness Mother        alzheimers  . Heart disease Mother   . Heart disease Father        triple by pass  . Hypertension Brother        Prostate Cancer  . Prostate cancer Brother   . Hypertension Brother   . Hypertension Brother   . COPD Brother   . Colon cancer Neg Hx   . Colon polyps Neg Hx   . Rectal cancer Neg Hx   . Stomach cancer Neg Hx     SOCIAL HISTORY: Social History   Social History  . Marital status: Married    Spouse name: N/A  . Number of children: 0  . Years of education: N/A   Occupational History  . retired Retired    Optometrist   Social History Main Topics  . Smoking status: Former Smoker    Years: 5.00    Types: Cigarettes    Quit date: 10/10/1965  . Smokeless tobacco: Never Used  . Alcohol use No  . Drug use: No  . Sexual activity: Yes    Partners: Female   Other Topics Concern  . Not on file   Social History Narrative   Exercise-- treadmill, stationary bike       REVIEW OF SYSTEMS: Constitutional: No fevers, chills, or sweats, no generalized fatigue, change in appetite Eyes: No visual changes, double vision, eye pain Ear, nose and throat:  No hearing loss, ear pain, nasal congestion, sore throat Cardiovascular: No chest pain, palpitations Respiratory:  No shortness of breath at rest or with exertion, wheezes GastrointestinaI: No nausea, vomiting, diarrhea, abdominal pain, fecal incontinence Genitourinary:  No dysuria, urinary retention or frequency Musculoskeletal:  No neck pain, back pain Integumentary: No rash, pruritus, skin lesions Neurological: as above Psychiatric: No depression, insomnia, anxiety Endocrine: No palpitations, fatigue, diaphoresis, mood swings, change in appetite, change in weight, increased thirst Hematologic/Lymphatic:  No purpura, petechiae. Allergic/Immunologic: no itchy/runny eyes, nasal congestion, recent allergic reactions, rashes  PHYSICAL EXAM: Vitals:   07/25/17 0731  BP: 122/68  Pulse: 64  SpO2: 98%   General: No acute distress.  Patient appears well-groomed.  normal body habitus. Head:  Normocephalic/atraumatic Eyes:  Fundi examined but not visualized Neck: supple, no paraspinal tenderness, full range of motion Heart:  Regular rate and rhythm Lungs:  Clear to auscultation bilaterally Back: No paraspinal tenderness Neurological Exam: alert and oriented to person, place, and time. Attention span and concentration intact, recent and remote memory intact, fund of knowledge intact.  Speech fluent and not dysarthric, language intact.  CN II-XII intact. Bulk and tone normal, no cogwheeling or other rigidity, muscle strength 5/5 throughout.  Sensation to light touch  intact.  Deep tendon reflexes 2+ throughout.  Finger to nose testing with slight fine intention tremor bilaterally.  Gait normal stride with reduced right arm swing   IMPRESSION: Migraine without aura, stable Essential tremor Excessive daytime sleepiness.  Consider OSA.  Given migraine and cardiac history, should be evaluated.    PLAN: 1.  Continue propranolol 80mg  twice daily 2.  Ginger tea and rest for abortive migraine  therapy 3.  Refer to [facility] for evaluation and treatment of possible OSA.  Kevin Clines, DO  CC:  Kevin Schanz, DO

## 2017-08-19 ENCOUNTER — Encounter: Payer: Self-pay | Admitting: Family Medicine

## 2017-08-19 DIAGNOSIS — E785 Hyperlipidemia, unspecified: Secondary | ICD-10-CM

## 2017-08-20 ENCOUNTER — Encounter (HOSPITAL_COMMUNITY): Payer: Self-pay | Admitting: Emergency Medicine

## 2017-08-20 ENCOUNTER — Ambulatory Visit (HOSPITAL_COMMUNITY)
Admission: EM | Admit: 2017-08-20 | Discharge: 2017-08-20 | Disposition: A | Discharge status: Home / Self Care | Payer: Medicare Other | Attending: Family Medicine | Admitting: Family Medicine

## 2017-08-20 DIAGNOSIS — F329 Major depressive disorder, single episode, unspecified: Secondary | ICD-10-CM | POA: Insufficient documentation

## 2017-08-20 DIAGNOSIS — E119 Type 2 diabetes mellitus without complications: Secondary | ICD-10-CM | POA: Insufficient documentation

## 2017-08-20 DIAGNOSIS — Z8673 Personal history of transient ischemic attack (TIA), and cerebral infarction without residual deficits: Secondary | ICD-10-CM | POA: Insufficient documentation

## 2017-08-20 DIAGNOSIS — N400 Enlarged prostate without lower urinary tract symptoms: Secondary | ICD-10-CM | POA: Diagnosis not present

## 2017-08-20 DIAGNOSIS — H6613 Chronic tubotympanic suppurative otitis media, bilateral: Secondary | ICD-10-CM | POA: Diagnosis not present

## 2017-08-20 DIAGNOSIS — R300 Dysuria: Secondary | ICD-10-CM

## 2017-08-20 DIAGNOSIS — E785 Hyperlipidemia, unspecified: Secondary | ICD-10-CM | POA: Insufficient documentation

## 2017-08-20 DIAGNOSIS — R351 Nocturia: Secondary | ICD-10-CM

## 2017-08-20 DIAGNOSIS — R8281 Pyuria: Secondary | ICD-10-CM

## 2017-08-20 DIAGNOSIS — E039 Hypothyroidism, unspecified: Secondary | ICD-10-CM | POA: Insufficient documentation

## 2017-08-20 DIAGNOSIS — Z882 Allergy status to sulfonamides status: Secondary | ICD-10-CM | POA: Insufficient documentation

## 2017-08-20 DIAGNOSIS — I10 Essential (primary) hypertension: Secondary | ICD-10-CM | POA: Diagnosis not present

## 2017-08-20 DIAGNOSIS — N390 Urinary tract infection, site not specified: Secondary | ICD-10-CM | POA: Insufficient documentation

## 2017-08-20 DIAGNOSIS — J40 Bronchitis, not specified as acute or chronic: Secondary | ICD-10-CM | POA: Insufficient documentation

## 2017-08-20 DIAGNOSIS — K648 Other hemorrhoids: Secondary | ICD-10-CM | POA: Insufficient documentation

## 2017-08-20 DIAGNOSIS — I2510 Atherosclerotic heart disease of native coronary artery without angina pectoris: Secondary | ICD-10-CM | POA: Diagnosis not present

## 2017-08-20 DIAGNOSIS — R05 Cough: Secondary | ICD-10-CM | POA: Diagnosis not present

## 2017-08-20 DIAGNOSIS — H6983 Other specified disorders of Eustachian tube, bilateral: Secondary | ICD-10-CM | POA: Diagnosis not present

## 2017-08-20 DIAGNOSIS — Z85828 Personal history of other malignant neoplasm of skin: Secondary | ICD-10-CM | POA: Diagnosis not present

## 2017-08-20 LAB — POCT URINALYSIS DIP (DEVICE)
BILIRUBIN URINE: NEGATIVE
GLUCOSE, UA: 100 mg/dL — AB
Hgb urine dipstick: NEGATIVE
Nitrite: POSITIVE — AB
Protein, ur: 30 mg/dL — AB
Specific Gravity, Urine: 1.025 (ref 1.005–1.030)
Urobilinogen, UA: 0.2 mg/dL (ref 0.0–1.0)
pH: 6 (ref 5.0–8.0)

## 2017-08-20 MED ORDER — DOXYCYCLINE HYCLATE 100 MG PO TABS: 100.0000 mg | ORAL_TABLET | Freq: Two times a day (BID) | ORAL | 0 refills | Status: DC

## 2017-08-20 NOTE — ED Triage Notes (Signed)
Pt here for possible UTI and cough x 3 days

## 2017-08-20 NOTE — ED Provider Notes (Signed)
[facility]   [id number] 08/20/17 Arrival Time: 1237   SUBJECTIVE:  [name] is a 71 y.o. male who presents to the urgent care with complaint of possible UTI for the past month with terminal dysuria and increasing nocturia, and productive cough x 14 days  with white phlegm.  Patient has not had a fever. No hematuria and no flank pain  Patient has history of BPH and takes Flomax for that.  Patient has a history of mastoiditis and currently has myringotomy tubes placed bilaterally     Past Medical History:  Diagnosis Date  . Allergy   . Arthritis   . CAD (coronary artery disease)    s/p PTCI in 2003 and 2004 to the LAD.   Marland Kitchen Chronic tubotympanic suppurative otitis media, bilateral   . Depression   . Diabetes mellitus   . Eustachian tube dysfunction   . History of placement of ear tubes   . Hyperlipidemia   . Hypertension   . Hypothyroid   . Internal hemorrhoids   . Migraine headache   . Skin cancer, basal cell   . Thrombocytopenia ([place])   . Thrombocytopenia, unspecified ([place]) 05/08/2013  . TIA (transient ischemic attack) 2006   per patient's report.  He was never officially given this diagnosis   Family History  Problem Relation Age of Onset  . Mental illness Mother        alzheimers  . Heart disease Mother   . Heart disease Father        triple by pass  . Hypertension Brother        Prostate Cancer  . Prostate cancer Brother   . Hypertension Brother   . Hypertension Brother   . COPD Brother   . Colon cancer Neg Hx   . Colon polyps Neg Hx   . Rectal cancer Neg Hx   . Stomach cancer Neg Hx    Social History   Socioeconomic History  . Marital status: Married    Spouse name: Not on file  . Number of children: 0  . Years of education: Not on file  . Highest education level: Not on file  Social Needs  . Financial resource strain: Not on file  . Food insecurity - worry: Not on file  . Food insecurity - inability: Not on file  .  Transportation needs - medical: Not on file  . Transportation needs - non-medical: Not on file  Occupational History  . Occupation: retired    Fish farm manager: RETIRED    Comment: accountant  Tobacco Use  . Smoking status: Former Smoker    Years: 5.00    Types: Cigarettes    Last attempt to quit: 10/10/1965    Years since quitting: 51.8  . Smokeless tobacco: Never Used  Substance and Sexual Activity  . Alcohol use: No    Alcohol/week: 0.0 oz  . Drug use: No  . Sexual activity: Yes    Partners: Female  Other Topics Concern  . Not on file  Social History Narrative   Exercise-- treadmill, stationary bike   No outpatient medications have been marked as taking for the 08/20/17 encounter [facility] Encounter).   Allergies  Allergen Reactions  . Sulfa Antibiotics Other (See Comments)  . Sulfamethoxazole-Trimethoprim Nausea And Vomiting  . Codeine   . Crestor [Rosuvastatin Calcium]   . Hydrocodone-Acetaminophen     vicodin   . Other     Apples and tomatoes...... Nausea and migraines.    . Rosuvastatin  REACTION: REALLY BAD JOINT/MUSCLE PAIN      ROS: As per HPI, remainder of ROS negative.   OBJECTIVE:   Vitals:   08/20/17 1318  BP: 140/68  Pulse: 68  Resp: 18  Temp: 98.2 F (36.8 C)  TempSrc: Oral  SpO2: 97%     General appearance: alert; no distress Eyes: PERRL; EOMI; conjunctiva normal HENT: normocephalic; atraumatic; TMs bilateral white myringotomy tubes that are open, canal normal, external ears normal without trauma; nasal mucosa normal; oral mucosa normal Neck: supple Lungs: clear to auscultation bilaterally Heart: regular rate and rhythm Back: no CVA tenderness Extremities: no cyanosis or edema; symmetrical with no gross deformities Skin: warm and dry Neurologic: normal gait; grossly normal Psychological: alert and cooperative; normal mood and affect      Labs:  Results for orders placed or performed during the hospital encounter of 08/20/17    POCT urinalysis dip (device)  Result Value Ref Range   Glucose, UA 100 (A) NEGATIVE mg/dL   Bilirubin Urine NEGATIVE NEGATIVE   Ketones, ur TRACE (A) NEGATIVE mg/dL   Specific Gravity, Urine 1.025 1.005 - 1.030   Hgb urine dipstick NEGATIVE NEGATIVE   pH 6.0 5.0 - 8.0   Protein, ur 30 (A) NEGATIVE mg/dL   Urobilinogen, UA 0.2 0.0 - 1.0 mg/dL   Nitrite POSITIVE (A) NEGATIVE   Leukocytes, UA SMALL (A) NEGATIVE    Labs Reviewed  POCT URINALYSIS DIP (DEVICE) - Abnormal; Notable for the following components:      Result Value   Glucose, UA 100 (*)    Ketones, ur TRACE (*)    Protein, ur 30 (*)    Nitrite POSITIVE (*)    Leukocytes, UA SMALL (*)    All other components within normal limits  URINE CULTURE    No results found.     ASSESSMENT & PLAN:  1. Bronchitis   2. Pyuria    Suspect patient has prostatitis but this may have spread to his bladder.  I explained that the cough is most likely secondary to viral bronchitis. Meds ordered this encounter  Medications  . doxycycline (VIBRA-TABS) 100 MG tablet    Sig: Take 1 tablet (100 mg total) 2 (two) times daily by mouth.    Dispense:  20 tablet    Refill:  0    Reviewed expectations re: course of current medical issues. Questions answered. Outlined signs and symptoms indicating need for more acute intervention. Patient verbalized understanding. After Visit Summary given.      Robyn Haber, MD 08/20/17 1413

## 2017-08-22 MED ORDER — EZETIMIBE 10 MG PO TABS: 10.0000 mg | ORAL_TABLET | Freq: Every day | ORAL | 0 refills | Status: DC

## 2017-08-23 ENCOUNTER — Telehealth (HOSPITAL_COMMUNITY): Payer: Self-pay | Admitting: Internal Medicine

## 2017-08-23 LAB — URINE CULTURE: Culture: >=100000 — AB

## 2017-08-23 MED ORDER — NITROFURANTOIN MONOHYD MACRO 100 MG PO CAPS: 100.0000 mg | ORAL_CAPSULE | Freq: Two times a day (BID) | ORAL | 0 refills | Status: AC

## 2017-08-23 MED ORDER — EZETIMIBE 10 MG PO TABS: 10.0000 mg | ORAL_TABLET | Freq: Every day | ORAL | 0 refills | Status: DC

## 2017-08-23 NOTE — Addendum Note (Signed)
Addended byDamita Dunnings D on: 08/23/2017 02:11 PM   Modules accepted: Orders

## 2017-08-23 NOTE — Telephone Encounter (Signed)
Clinical staff, please let patient know that urine culture was positive for Enterobacter germ, not typically sensitive to doxycycline.  Would finish the doxycycline (for respiratory symptoms).   Rx nitrofurantoin sent to the pharmacy of record, [facility] [address] at [employer].  Take all of the nitrofurantoin.  Recheck or followup with PCP for further evaluation if symptoms are not improving.  LM

## 2017-08-29 ENCOUNTER — Encounter: Payer: Self-pay | Admitting: Neurology

## 2017-08-29 ENCOUNTER — Ambulatory Visit (INDEPENDENT_AMBULATORY_CARE_PROVIDER_SITE_OTHER): Payer: Medicare Other | Admitting: Neurology

## 2017-08-29 VITALS — BP 130/71 | HR 63 | Ht 69.0 in | Wt 175.0 lb

## 2017-08-29 DIAGNOSIS — R0683 Snoring: Secondary | ICD-10-CM | POA: Insufficient documentation

## 2017-08-29 DIAGNOSIS — R351 Nocturia: Secondary | ICD-10-CM | POA: Diagnosis not present

## 2017-08-29 DIAGNOSIS — I2510 Atherosclerotic heart disease of native coronary artery without angina pectoris: Secondary | ICD-10-CM

## 2017-08-29 DIAGNOSIS — G44011 Episodic cluster headache, intractable: Secondary | ICD-10-CM | POA: Diagnosis not present

## 2017-08-29 DIAGNOSIS — N401 Enlarged prostate with lower urinary tract symptoms: Secondary | ICD-10-CM | POA: Insufficient documentation

## 2017-08-29 DIAGNOSIS — G250 Essential tremor: Secondary | ICD-10-CM

## 2017-08-29 HISTORY — DX: Essential tremor: G25.0

## 2017-08-29 HISTORY — DX: Episodic cluster headache, intractable: G44.011

## 2017-08-29 NOTE — Progress Notes (Signed)
SLEEP MED[facility]INE CLINIC   Provider:  Larey Seat, M D  Primary Care Physician:  Carollee Herter, Alferd Apa, DO   Referring Provider: Metta Clines, D.O.    Chief Complaint  Patient presents with  . New Patient (Initial Visit)    pt with wife, rm 45. pt states that he has been told he snores.     HPI:  [name] is a 71 y.o. male , seen here as in a referral from Dr. Tomi Likens for a sleep evaluation after developing chronic migraines. .  [name] is seen here today with his wife, whom I have also seen for sleep apnea before.  She had mentioned during a visit that her husband snores and has nocturia his current list of diagnoses includes osteoarthritis coronary artery disease with stent placement in 2003 and 2004, diabetes mellitus, hyperlipidemia, hypertension, hypothyroidism, internal hemorrhoids, thrombocytopenia and a TIA in 2006.  The patient suffers from migraine headaches and has been evaluated by Dr. Tomi Likens he had seen an ear nose and throat specialist on 14 December last year after he was diagnosed with mild mastoiditis.  An MRI of the brain was obtained in November 2017 and revealed only mild chronic small vessel changes but no stroke or bleed or mass-effect.  The patient is taking not able to take Sumatriptan after his CAD was diagnosed.  Chief complaint according to patient : He wakes up with headaches frequently, but headaches do not necessarily wake him out of sleep   [name] mentions that since Dr. Tomi Likens has placed him back on propanolol replacing the Toprol had none in that interval of about 2-3 months.  Sleep habits are as follows: [name] is a late and goes to bed between midnight and 1 AM, but he is promptly asleep.  The bedroom is cool quiet and dark.  Prefers to sleep on his left side due to a back problem, uses only one pillow for head support- the contoured adjustable pillow" MyPillow" . He wakes up about every 2 hours or less due to nocturia, erupting his sleep frequently.   Usually goes right back to sleep after each break.  His wife has noticed that he snores, he is not quite sure if she ever witnessed apneas.  There is some crescendo snoring noted.  Over the last weeks with bronchitis he has also developed more dry mouth and is potentially more mouth breathing.  He has a nasal congestion now.  He wakes non restored with non migrainous headaches- cluster type, right or left side , temple and forhead. A sharp stabbing headache through the eye.  He reports no vivid dreams.  No dream enacting known. The patient rises at 8 AM, spontaneous.    Sleep medical history and family sleep history:  Carney brother died at age 20 of asthma/ copd. He was a heavy smoker.  He is not aware of any family members having been diagnosed, the patient is not aware of having ever been a sleep walker, suffering from night terrors or enuresis.    Social history: married, retired, non smoker, used to be a social drinker but has reduced since this has been a trigger for migrainous headaches.  Caffeine user;  No sodas, in PM iced tea and in AM coffee.  Review of Systems: Out of a complete 14 system review, the patient complains of only the following symptoms, and all other reviewed systems are negative. Snoring, nocturia, migraine ,   Epworth score  12 , Fatigue severity  score 37  , depression score 3/15  The patient kept immaculate records. Accountant .   Social History   Socioeconomic History  . Marital status: Married    Spouse name: Not on file  . Number of children: 0  . Years of education: Not on file  . Highest education level: Not on file  Social Needs  . Financial resource strain: Not on file  . Food insecurity - worry: Not on file  . Food insecurity - inability: Not on file  . Transportation needs - medical: Not on file  . Transportation needs - non-medical: Not on file  Occupational History  . Occupation: retired    Fish farm manager: RETIRED    Comment: accountant  Tobacco  Use  . Smoking status: Former Smoker    Years: 5.00    Types: Cigarettes    Last attempt to quit: 10/10/1965    Years since quitting: 51.9  . Smokeless tobacco: Never Used  Substance and Sexual Activity  . Alcohol use: No    Alcohol/week: 0.0 oz  . Drug use: No  . Sexual activity: Yes    Partners: Female  Other Topics Concern  . Not on file  Social History Narrative   Exercise-- treadmill, stationary bike    Family History  Problem Relation Age of Onset  . Mental illness Mother        alzheimers  . Heart disease Mother   . Heart disease Father        triple by pass  . Hypertension Brother        Prostate Cancer  . Prostate cancer Brother   . Hypertension Brother   . Hypertension Brother   . COPD Brother   . Colon cancer Neg Hx   . Colon polyps Neg Hx   . Rectal cancer Neg Hx   . Stomach cancer Neg Hx     Past Medical History:  Diagnosis Date  . Allergy   . Arthritis   . CAD (coronary artery disease)    s/p PTCI in 2003 and 2004 to the LAD.   Marland Kitchen Chronic tubotympanic suppurative otitis media, bilateral   . Depression   . Diabetes mellitus   . Eustachian tube dysfunction   . History of placement of ear tubes   . Hyperlipidemia   . Hypertension   . Hypothyroid   . Internal hemorrhoids   . Migraine headache   . Skin cancer, basal cell   . Thrombocytopenia ([place])   . Thrombocytopenia, unspecified ([place]) 05/08/2013  . TIA (transient ischemic attack) 2006   per patient's report.  He was never officially given this diagnosis    Past Surgical History:  Procedure Laterality Date  . COLONOSCOPY  2011  . coronary artery disease status post placement     of drug-eluting stent in the LAD in 2003,eEF 65% then  . fatty tissue removed     from neck 2003  . HERNIA REPAIR  2007  . POLYPECTOMY  2011   +TA  . right toe bone spur surgery    . THYROIDECTOMY, PARTIAL  07/2002   right thyroid  . TONSILLECTOMY      Current Outpatient Medications  Medication Sig Dispense  Refill  . aspirin 81 MG tablet Take 81 mg by mouth daily.      Marland Kitchen atorvastatin (LIPITOR) 40 MG tablet Take 1 tablet (40 mg total) by mouth daily. 30 tablet 11  . Biotin 1000 MCG tablet Take 1,000 mcg by mouth daily.     Marland Kitchen  Cinnamon 500 MG capsule Take 1,000 mg by mouth 2 (two) times daily.     . Coenzyme Q10 (COQ-10) 100 MG capsule Take 100 mg by mouth 2 (two) times daily.     Marland Kitchen doxycycline (VIBRA-TABS) 100 MG tablet Take 1 tablet (100 mg total) 2 (two) times daily by mouth. 20 tablet 0  . ezetimibe (ZETIA) 10 MG tablet Take 1 tablet (10 mg total) daily by mouth. 90 tablet 0  . FEVERFEW PO Take 1 capsule by mouth 3 (three) times daily.    . fluticasone (FLONASE) 50 MCG/ACT nasal spray Place 2 sprays into both nostrils daily. 16 g 5  . Ginger, Zingiber officinalis, (GINGER ROOT) 550 MG CAPS Take 1 capsule by mouth daily.      Marland Kitchen levothyroxine (SYNTHROID, LEVOTHROID) 112 MCG tablet Take 1 tablet (112 mcg total) by mouth daily.    Marland Kitchen losartan-hydrochlorothiazide (HYZAAR) 100-12.5 MG tablet Take 1 tablet by mouth daily. 30 tablet 11  . magnesium 30 MG tablet Take 30 mg by mouth 2 (two) times daily.    . metFORMIN (GLUMETZA) 500 MG (MOD) 24 hr tablet Take 1 tablet (500 mg total) by mouth 2 (two) times daily with a meal.    . multivitamin (THERAGRAN) per tablet Take 1 tablet by mouth daily.      . nitrofurantoin, macrocrystal-monohydrate, (MACROBID) 100 MG capsule Take 1 capsule (100 mg total) 2 (two) times daily for 14 days by mouth. 28 capsule 0  . Omega-3 Fatty Acids (FISH OIL) 1200 MG CAPS Take 2 capsules by mouth 2 (two) times daily.      Marland Kitchen OVER THE COUNTER MEDICATION Migra-eeze: Take 1 gel capsule two times daily.    . piroxicam (FELDENE) 10 MG capsule 1-2 daily prn 60 capsule 2  . propranolol (INDERAL) 80 MG tablet Take 1 tablet (80 mg total) by mouth 2 (two) times daily. 60 tablet 11  . ranitidine (ZANTAC) 300 MG tablet Take 1 tablet (300 mg total) by mouth at bedtime. (Patient not taking:  Reported on 07/25/2017) 30 tablet 2  . tamsulosin (FLOMAX) 0.4 MG CAPS capsule Take 1 capsule (0.4 mg total) by mouth daily. Reported on 09/23/2015 30 capsule   . Turmeric 500 MG CAPS Take 1 capsule by mouth daily.    Marland Kitchen UNABLE TO FIND Migra-Eeze     No current facility-administered medications for this visit.     Allergies as of 08/29/2017 - Review Complete 08/29/2017  Allergen Reaction Noted  . Sulfa antibiotics Other (See Comments) 05/18/2017  . Sulfamethoxazole-trimethoprim Nausea And Vomiting 05/17/2017  . Codeine    . Crestor [rosuvastatin calcium]  07/12/2011  . Hydrocodone-acetaminophen    . Other  08/03/2015  . Rosuvastatin  07/10/2007    Vitals: BP 130/71   Pulse 63   Ht 5\' 9"  (1.753 m)   Wt 175 lb (79.4 kg)   BMI 25.84 kg/m  Last Weight:  Wt Readings from Last 1 Encounters:  08/29/17 175 lb (79.4 kg)   QMG:QQPY mass index is 25.84 kg/m.     Last Height:   Ht Readings from Last 1 Encounters:  08/29/17 5\' 9"  (1.753 m)    Physical exam:  General: The patient is awake, alert and appears not in acute distress. The patient is well groomed. Head: Normocephalic, atraumatic. Neck is supple. Mallampati 4,  neck circumference:15. Nasal airflow congested , TMJ click  is not  evident . Retrognathia is not seen.  Cardiovascular:  Regular rate and rhythm , without  murmurs or carotid  bruit, and without distended neck veins. Respiratory: Lungs are clear to auscultation. Skin:  Coughing, rhonchi  Trunk: BMI is 26. The patient's posture is stooped  Neurologic exam : The patient is awake and alert, oriented to place and time.      MOCA:No flowsheet data found. MMSE: MMSE - Mini Mental State Exam 10/06/2016  Orientation to time 5  Orientation to Place 5  Registration 3  Attention/ Calculation 5  Recall 3  Language- name 2 objects 2  Language- repeat 1  Language- follow 3 step command 3  Language- read & follow direction 1  Write a sentence 1  Copy design 1  Total  score 30       Attention span & concentration ability appears normal.  Speech is fluent,  without  dysarthria, dysphonia or aphasia.  Mood and affect are appropriate.  Cranial nerves: Pupils are equal and briskly reactive to light. Funduscopic exam without  evidence of pallor or edema. Extraocular movements  in vertical and horizontal planes intact and without nystagmus. Visual fields by finger perimetry are intact. Hearing to finger rub intact.   Facial sensation intact to fine touch.  Facial motor strength is symmetric, but appears slightly masked.  His  tongue and uvula move midline. Shoulder shrug was symmetrical.   Motor exam:  Normal tone, muscle bulk and symmetric strength in all extremities. Tremor over the right, dominant hand. Handwriting has changed.  Sensory:  Fine touch, pinprick and vibration were tested in all extremities.  Proprioception tested in the upper extremities was normal. Coordination: Rapid alternating movements in the fingers/hands was normal. Finger-to-nose maneuver  normal without evidence of ataxia, dysmetria or tremor. Gait and station: Patient walks without assistive device and is able unassisted to climb up to the exam table. Strength within normal limits.  Stance is stable and normal. There is armswing, no pill-rolling termor. Turns with 2- 3 Steps. Very limber. He appear to keep the right shoulder lower and more anterior.   Deep tendon reflexes: in the  upper and lower extremities are symmetric and intact. Babinski maneuver response is  downgoing.    Assessment:  After physical and neurologic examination, review of laboratory studies,  Personal review of imaging studies, reports of other /same  Imaging studies, results of polysomnography and / or neurophysiology testing and pre-existing records as far as provided in visit., my assessment is   1) [name] fits actually only into the mild to moderate risk factor category for the presence of obstructive  sleep apnea.  There has been witnessed snoring but thus far no clear witnessed apnea.  Crescendo snoring however is highly overlapping with the presence of obstructive sleep apnea.  2)Nocturia can be a side effect of OSA ,as it is a dry mouth. Given the patient's comorbidities as listed in Dr. Georgie Chard notes especially the coronary artery disease component and the vascular headache component I would be most interested in attended overnight sleep study to measure CO2 and oxygen levels.   3) Hypoxemia and hypercapnia risk factors cluster headaches which [name] has described.  The patient was advised of the nature of the diagnosed disorder , the treatment options and the  risks for general health and wellness arising from not treating the condition.   I spent more than 50 minutes of face to face time with the patient. Greater than 50% of time was spent in counseling and coordination of care. We have discussed the diagnosis and differential and I answered the patient's questions.  Plan:  Treatment plan and additional workup : I will order a split-night polysomnography to be split at an AHI of 20 for this patient with a history of coronary artery disease, angioplasty treatment of coronary artery disease, cluster headaches now controlled migrainous headaches on propranolol.  [name] also presents essential tremor but has a unilateral manifestation it has not giving him dysphonia and it seems not to have affected his ability to walk he was able to turn was 2 steps.    Larey Seat, MD 75/43/6067, 7:03 PM  Certified in Neurology by ABPN Certified in [place] by [facility] [address] [place], [place] [address]

## 2017-08-29 NOTE — Patient Instructions (Signed)
Cluster Headache Cluster headaches are deeply painful. They normally occur on one side of your head, but they may switch sides. Often, cluster headaches:  Are severe.  Happen often for a few weeks or months and then go away for a while.  Last from 15 minutes to 3 hours.  Happen at the same time each day.  Happen at night.  Happen many times a day.  Follow these instructions at home: Follow instructions from your doctor to care for yourself at home:  Go to bed at the same time each night. Get the same amount of sleep every night.  Avoid alcohol.  Stop smoking if you smoke. This includes cigarettes and e-cigarettes.  Take over-the-counter and prescription medicines only as told by your doctor.  Do not drive or use heavy machinery while taking prescription pain medicine.  Use oxygen as told by your doctor.  Exercise regularly.  Eat a healthy diet.  Write down when each headache happened, what kind of pain you had, how bad your pain was, and what you tried to help your pain. This is called a headache diary. Use it as told by your doctor.  Contact a doctor if:  Your headaches get worse or they happen more often.  Your medicines are not helping. Get help right away if:  You pass out (faint).  You get weak or lose feeling (have numbness) on one side of your body or face.  You see two of everything (double vision).  You feel sick to your stomach (nauseous) or you throw up (vomit), and you do not stop after many hours.  You have trouble with your balance or with walking.  You have trouble talking.  You have neck pain or stiffness.  You have a fever. This information is not intended to replace advice given to you by your health care provider. Make sure you discuss any questions you have with your health care provider. Document Released: 11/03/2004 Document Revised: 06/03/2016 Document Reviewed: 06/03/2016 Elsevier Interactive Patient Education  2017 [place].   Sleep Studies A sleep study (polysomnogram) is a series of tests done while you are sleeping. It can show how well you sleep. This can help your health care provider diagnose a sleep disorder and show how severe your sleep disorder is. A sleep study may lead to treatment that will help you sleep better and prevent other medical problems caused by poor sleep. If you have a sleep disorder, you may also be at risk for:  Sleep-related accidents.  High blood pressure.  Heart disease.  Stroke.  Other medical conditions.  Sleep disorders are common. Your health care provider may suspect a sleep disorder if you:  Have loud snoring most nights.  Have brief periods when you stop breathing at night.  Feel sleepy on most days.  Fall asleep suddenly during the day.  Have trouble falling asleep or staying asleep.  Feel like you need to move your legs when trying to fall asleep.  Have dreams that seem very real shortly after falling asleep.  Feel like you cannot move when you first wake up.  Which tests will I need to have? Most sleep studies last all night and include these tests:  Recordings of your brain activity.  Recordings of your eye movements.  Recording of your heart rate and rhythm.  Blood pressure readings.  Readings of the amount of oxygen in your blood.  Measurements of your chest and belly movement as you breathe during sleep.  If  you have signs of the sleep disorder called sleep apnea during your test, you may get a mask to wear for the second half of the night.  The mask provides continuous positive airway pressure (CPAP). This may improve sleep apnea significantly.  You will then have all tests done again with the mask in place to see if your measurements and recordings change.  How are sleep studies done? Most sleep studies are done over one full night of sleep.  You will arrive at the study center in the evening and can go home in the  morning.  Bring your pajamas and toothbrush.  Do not have caffeine on the day of your sleep study.  Your health care provider will let you know if you need to stop taking any of your regular medicines before the test.  To do the tests included in a polysomnogram, you will have:  Round, sticky patches with sensors attached to recording wires (electrodes) placed on your scalp, face, chest, and limbs.  Wires from all the electrodes and sensors run from your bed to a computer. The wires can be taken off and put back on if you need to get out of bed to go to the bathroom.  A sensor placed over your nose to measure airflow.  A finger clip put on one finger to measure your blood oxygen level.  A belt around your belly and a belt around your chest to measure breathing movements.  Where are sleep studies done? Sleep studies are done at sleep centers. A sleep center may be inside a hospital, office, or clinic. The room where you have the study may look like a hospital room or a hotel room. The health care providers doing the study may come in and out of the room during the study. Most of the time, they will be in another room monitoring your test. How is information from sleep studies helpful? A polysomnogram can be used along with your medical history and a physical exam to diagnose conditions, such as:  Sleep apnea.  Restless legs syndrome.  Sleep-related seizure disorders.  Sleep-related movement disorders.  A medical doctor who specializes in sleep will evaluate your sleep study. The specialist will share the results with your primary health care provider. Treatments based on your sleep study may include:  Improving your sleep habits (sleep hygiene).  Wearing a CPAP mask.  Wearing an oral device at night to improve breathing and reduce snoring.  Taking medicine for: ? Restless legs syndrome. ? Sleep-related seizure disorder. ? Sleep-related movement disorder.  This  information is not intended to replace advice given to you by your health care provider. Make sure you discuss any questions you have with your health care provider. Document Released: 04/02/2003 Document Revised: 05/22/2016 Document Reviewed: 12/02/2013 Elsevier Interactive Patient Education  [employer].

## 2017-09-02 ENCOUNTER — Ambulatory Visit (INDEPENDENT_AMBULATORY_CARE_PROVIDER_SITE_OTHER): Payer: Medicare Other

## 2017-09-02 ENCOUNTER — Ambulatory Visit (HOSPITAL_COMMUNITY)
Admission: EM | Admit: 2017-09-02 | Discharge: 2017-09-02 | Disposition: A | Discharge status: Home / Self Care | Payer: Medicare Other | Attending: Emergency Medicine | Admitting: Emergency Medicine

## 2017-09-02 ENCOUNTER — Other Ambulatory Visit: Payer: Self-pay

## 2017-09-02 ENCOUNTER — Encounter (HOSPITAL_COMMUNITY): Payer: Self-pay | Admitting: Emergency Medicine

## 2017-09-02 DIAGNOSIS — R05 Cough: Secondary | ICD-10-CM | POA: Diagnosis not present

## 2017-09-02 DIAGNOSIS — R059 Cough, unspecified: Secondary | ICD-10-CM

## 2017-09-02 NOTE — ED Provider Notes (Signed)
[place]    CSN: [id number] Arrival date & time: 09/02/17  1304     History   Chief Complaint Chief Complaint  Patient presents with  . Cough    HPI [name] is a 71 y.o. male.   HPI   Patient is presenting today with his wife with complaints of productive green cough times 1 month.  Patient states he was seen here approximately 2 weeks ago by Dr. Verline Lema and diagnosed with bronchitis and a urinary tract infection placed on doxycycline.  Patient states she does not feel like she has had any improvement in that he has periods of dyspnea particularly with coughing "spasms".   Past Medical History:  Diagnosis Date  . Allergy   . Arthritis   . CAD (coronary artery disease)    s/p PTCI in 2003 and 2004 to the LAD.   Marland Kitchen Chronic tubotympanic suppurative otitis media, bilateral   . Depression   . Diabetes mellitus   . Eustachian tube dysfunction   . History of placement of ear tubes   . Hyperlipidemia   . Hypertension   . Hypothyroid   . Internal hemorrhoids   . Migraine headache   . Skin cancer, basal cell   . Thrombocytopenia ([place])   . Thrombocytopenia, unspecified ([place]) 05/08/2013  . TIA (transient ischemic attack) 2006   per patient's report.  He was never officially given this diagnosis    Patient Active Problem List   Diagnosis Date Noted  . Coronary artery disease involving native coronary artery without angina pectoris 08/29/2017  . Snoring 08/29/2017  . Benign essential tremor 08/29/2017  . Nocturia more than twice per night 08/29/2017  . Intractable episodic cluster headache 08/29/2017  . Allergic reaction 05/22/2017  . Preventative health care 10/06/2016  . Other specified disorders of eustachian tube, bilateral 11/30/2015  . Bilateral sensorineural hearing loss 11/30/2015  . Acute sinusitis 03/26/2015  . Heartburn 09/16/2014  . Thrombocytopenia ([place]) 05/08/2013  . Hyperlipidemia LDL goal <70 07/12/2011  . INFLUENZA WITH OTHER  MANIFESTATIONS 10/28/2010  . ABDOMINAL BRUIT 07/22/2010  . HYPERTHYROIDISM 07/12/2010  . CORONARY ARTERY DISEASE 07/12/2010  . HEMORRHOIDS-INTERNAL 07/12/2010  . RECTAL BLEEDING 07/12/2010  . BLOOD IN STOOL, OCCULT 07/12/2010  . PERSONAL HISTORY OF COLONIC POLYPS 07/12/2010  . BLOOD IN STOOL 07/08/2010  . DM (diabetes mellitus) type II uncontrolled, periph vascular disorder ([place]) 07/06/2010  . Essential hypertension 12/11/2008  . 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 CAD, NATIVE VESSEL 12/11/2008  . Hypothyroidism 07/10/2007  . Other and unspecified hyperlipidemia 07/10/2007  . DEPRESSION 07/10/2007  . OSTEOARTHRITIS 07/10/2007  . HEADACHE 07/10/2007    Past Surgical History:  Procedure Laterality Date  . COLONOSCOPY  2011  . coronary artery disease status post placement     of drug-eluting stent in the LAD in 2003,eEF 65% then  . fatty tissue removed     from neck 2003  . HERNIA REPAIR  2007  . POLYPECTOMY  2011   +TA  . right toe bone spur surgery    . THYROIDECTOMY, PARTIAL  07/2002   right thyroid  . TONSILLECTOMY         Home Medications    Prior to Admission medications   Medication Sig Start Date End Date Taking? Authorizing Provider  aspirin 81 MG tablet Take 81 mg by mouth daily.      [provider]  atorvastatin (LIPITOR) 40 MG tablet Take 1 tablet (40 mg total) by mouth daily. 11/21/16 11/21/17  Crenshaw,  Denice Bors, MD  Biotin 1000 MCG tablet Take 1,000 mcg by mouth daily.     [provider]  Cinnamon 500 MG capsule Take 1,000 mg by mouth 2 (two) times daily.     [provider]  Coenzyme Q10 (COQ-10) 100 MG capsule Take 100 mg by mouth 2 (two) times daily.     [provider]  doxycycline (VIBRA-TABS) 100 MG tablet Take 1 tablet (100 mg total) 2 (two) times daily by mouth. 08/20/17   Robyn Haber, MD  ezetimibe (ZETIA) 10 MG tablet Take 1 tablet (10 mg total) daily by mouth. 08/23/17   Carollee Herter, Alferd Apa, DO  FEVERFEW PO  Take 1 capsule by mouth 3 (three) times daily.    [provider]  fluticasone (FLONASE) 50 MCG/ACT nasal spray Place 2 sprays into both nostrils daily. 10/06/16   [name], DO  Ginger, Zingiber officinalis, (GINGER ROOT) 550 MG CAPS Take 1 capsule by mouth daily.      [provider]  levothyroxine (SYNTHROID, LEVOTHROID) 112 MCG tablet Take 1 tablet (112 mcg total) by mouth daily. 10/06/16   [name], DO  losartan-hydrochlorothiazide (HYZAAR) 100-12.5 MG tablet Take 1 tablet by mouth daily. 11/21/16   Lelon Perla, MD  magnesium 30 MG tablet Take 30 mg by mouth 2 (two) times daily.    [provider]  metFORMIN (GLUMETZA) 500 MG (MOD) 24 hr tablet Take 1 tablet (500 mg total) by mouth 2 (two) times daily with a meal. 10/06/16   Carollee Herter, Alferd Apa, DO  multivitamin [facility]) per tablet Take 1 tablet by mouth daily.      [provider]  nitrofurantoin, macrocrystal-monohydrate, (MACROBID) 100 MG capsule Take 1 capsule (100 mg total) 2 (two) times daily for 14 days by mouth. 08/23/17 09/06/17  Sherlene Shams, MD  Omega-3 Fatty Acids (FISH OIL) 1200 MG CAPS Take 2 capsules by mouth 2 (two) times daily.      [provider]  OVER THE COUNTER MEDICATION Migra-eeze: Take 1 gel capsule two times daily.    [provider]  piroxicam (FELDENE) 10 MG capsule 1-2 daily prn 04/06/17   Carollee Herter, Alferd Apa, DO  propranolol (INDERAL) 80 MG tablet Take 1 tablet (80 mg total) by mouth 2 (two) times daily. 11/21/16   Lelon Perla, MD  ranitidine (ZANTAC) 300 MG tablet Take 1 tablet (300 mg total) by mouth at bedtime. Patient not taking: Reported on 07/25/2017 05/22/17   Carollee Herter, Alferd Apa, DO  tamsulosin (FLOMAX) 0.4 MG CAPS capsule Take 1 capsule (0.4 mg total) by mouth daily. Reported on 09/23/2015 03/24/16   [name], DO  Turmeric 500 MG CAPS Take 1 capsule by mouth daily.    [provider]    UNABLE TO [place]    [provider]    Family History Family History  Problem Relation Age of Onset  . Mental illness Mother        alzheimers  . Heart disease Mother   . Heart disease Father        triple by pass  . Hypertension Brother        Prostate Cancer  . Prostate cancer Brother   . Hypertension Brother   . Hypertension Brother   . COPD Brother   . Colon cancer Neg Hx   . Colon polyps Neg Hx   . Rectal cancer Neg Hx   . Stomach cancer Neg  Hx     Social History Social History   Tobacco Use  . Smoking status: Former Smoker    Years: 5.00    Types: Cigarettes    Last attempt to quit: 10/10/1965    Years since quitting: 51.9  . Smokeless tobacco: Never Used  Substance Use Topics  . Alcohol use: No    Alcohol/week: 0.0 oz  . Drug use: No     Allergies   Sulfa antibiotics; Sulfamethoxazole-trimethoprim; Codeine; Crestor [rosuvastatin calcium]; Hydrocodone-acetaminophen; Other; and Rosuvastatin   Review of Systems Review of Systems  Constitutional: Negative.  Negative for chills and fever.  HENT: Positive for ear discharge. Negative for sinus pressure, sneezing, sore throat and trouble swallowing.   Eyes: Negative.   Respiratory: Positive for cough and shortness of breath. Negative for chest tightness and wheezing.   Cardiovascular: Negative.  Negative for chest pain.  Gastrointestinal: Negative for diarrhea, nausea and vomiting.  Endocrine: Negative.   Genitourinary: Negative.   Musculoskeletal: Negative.   Skin: Negative.  Negative for pallor and rash.  Allergic/Immunologic: Positive for environmental allergies. Negative for food allergies and immunocompromised state.  Neurological: Negative.   Hematological: Negative.   Psychiatric/Behavioral: Negative.      Physical Exam Triage Vital Signs ED Triage Vitals [09/02/17 1413]  Enc Vitals Group     BP (!) 160/74     Pulse Rate (!) 57     Resp 20     Temp 98.1 F (36.7 C)      Temp Source Oral     SpO2 97 %     Weight      Height      Head Circumference      Peak Flow      Pain Score      Pain Loc      Pain Edu?      Excl. in [place]?    No data found.  Updated Vital Signs BP (!) 160/74 (BP Location: Left Arm)   Pulse (!) 57   Temp 98.1 F (36.7 C) (Oral)   Resp 20   SpO2 97%    Physical Exam  Constitutional: He appears well-developed and well-nourished.  HENT:  Head: Normocephalic and atraumatic.  Patient states he has bilateral placed secondary to a history of migraines. There is still opening present in the left tympanic membrane but no presence of a tube.  The right tympanic membrane has a white ear tube in place no drainage noted.  Eyes: Conjunctivae are normal.  Neck: Neck supple.  Negative nuchal rigidity.  Cardiovascular: Normal rate and regular rhythm.  No murmur heard. Grade 2/6 systolic murmur best heard over the pulmonic region.  Pulmonary/Chest: Effort normal and breath sounds normal. No respiratory distress.  Fine crackles noted in right lower lobe.  Clear to cough.  Patient is concerned for pneumonia.  Negative for E to A egophony.  Abdominal: Soft. There is no tenderness.  Musculoskeletal: He exhibits no edema.  Lymphadenopathy:    He has no cervical adenopathy.  Neurological: He is alert.  Skin: Skin is warm and dry. Capillary refill takes 2 to 3 seconds.  Psychiatric: He has a normal mood and affect.  Nursing note and vitals reviewed.    UC Treatments / Results  Labs (all labs ordered are listed, but only abnormal results are displayed) Labs Reviewed - No data to display  EKG  EKG Interpretation None       Radiology Dg Chest 2 View  Result Date: 09/02/2017 CLINICAL DATA:  Productive cough. EXAM: CHEST  2 VIEW COMPARISON:  Radiographs of May 18, 2017. FINDINGS: The heart size and mediastinal contours are within normal limits. No pneumothorax or pleural effusion is noted. No acute pulmonary disease is noted. Stable  right upper lobe scarring. The visualized skeletal structures are unremarkable. IMPRESSION: No active cardiopulmonary disease. Electronically Signed   By: Marijo Conception, M.D.   On: 09/02/2017 15:50    Procedures Procedures (including critical care time)  Medications Ordered in UC Medications - No data to display   Initial Impression / Assessment and Plan / UC Course  I have reviewed the triage vital signs and the nursing notes.  Pertinent labs & imaging results that were available during my care of the patient were reviewed by me and considered in my medical decision making (see chart for details).     Very concerned that he was developing pneumonia as he has not had significant improvement since being seen 2 weeks ago and placed on an antibiotic for bronchitis.  Chest x-ray done mostly for reassurance some fine crackles were noted in the bases but clear to cough.  Patient had negative E to a egophony.  Patient was instructed on use of incentive spirometer and given 1 to take home. and given one to take home.   Final Clinical Impressions(s) / UC Diagnoses   Final diagnoses:  Cough    ED Discharge Orders    None       Controlled Substance Prescriptions [place] Controlled Substance Registry consulted? No   The usual and customary discharge instructions and warnings were given.  The patient verbalizes understanding and agrees to plan of care.      Nehemiah Settle, NP 09/02/17 [phone]

## 2017-09-02 NOTE — ED Triage Notes (Signed)
Complains of a cough for a month.  Seen 11/11 for multiple concerns.  Today, cough is just getting any better.  Productive cough-brown -green phlegm.

## 2017-09-02 NOTE — Discharge Instructions (Signed)
Warm showers to help clear, incentive sprirometry 3-4 times daily.  You can try a hot toddy to help soothe the cough while the airways heal.  It can take 4 weeks for things to resolve following bronchitis.

## 2017-09-04 ENCOUNTER — Other Ambulatory Visit: Payer: Self-pay | Admitting: Dermatology

## 2017-09-04 DIAGNOSIS — L738 Other specified follicular disorders: Secondary | ICD-10-CM | POA: Diagnosis not present

## 2017-09-04 DIAGNOSIS — D485 Neoplasm of uncertain behavior of skin: Secondary | ICD-10-CM | POA: Diagnosis not present

## 2017-09-04 DIAGNOSIS — D225 Melanocytic nevi of trunk: Secondary | ICD-10-CM | POA: Diagnosis not present

## 2017-09-04 DIAGNOSIS — L7211 Pilar cyst: Secondary | ICD-10-CM | POA: Diagnosis not present

## 2017-09-04 DIAGNOSIS — D1801 Hemangioma of skin and subcutaneous tissue: Secondary | ICD-10-CM | POA: Diagnosis not present

## 2017-09-04 DIAGNOSIS — L570 Actinic keratosis: Secondary | ICD-10-CM | POA: Diagnosis not present

## 2017-09-04 DIAGNOSIS — L821 Other seborrheic keratosis: Secondary | ICD-10-CM | POA: Diagnosis not present

## 2017-09-06 DIAGNOSIS — Z87891 Personal history of nicotine dependence: Secondary | ICD-10-CM | POA: Diagnosis not present

## 2017-09-06 DIAGNOSIS — H6983 Other specified disorders of Eustachian tube, bilateral: Secondary | ICD-10-CM | POA: Diagnosis not present

## 2017-09-06 DIAGNOSIS — H6612 Chronic tubotympanic suppurative otitis media, left ear: Secondary | ICD-10-CM | POA: Diagnosis not present

## 2017-09-06 DIAGNOSIS — H6613 Chronic tubotympanic suppurative otitis media, bilateral: Secondary | ICD-10-CM | POA: Diagnosis not present

## 2017-09-06 DIAGNOSIS — Z9622 Myringotomy tube(s) status: Secondary | ICD-10-CM | POA: Diagnosis not present

## 2017-09-11 ENCOUNTER — Ambulatory Visit (INDEPENDENT_AMBULATORY_CARE_PROVIDER_SITE_OTHER): Payer: Medicare Other | Admitting: Family Medicine

## 2017-09-11 ENCOUNTER — Encounter: Payer: Self-pay | Admitting: Family Medicine

## 2017-09-11 VITALS — BP 128/80 | HR 75 | Temp 97.9°F | Ht 69.0 in | Wt 170.4 lb

## 2017-09-11 DIAGNOSIS — R059 Cough, unspecified: Secondary | ICD-10-CM

## 2017-09-11 DIAGNOSIS — R05 Cough: Secondary | ICD-10-CM

## 2017-09-11 MED ORDER — CETIRIZINE HCL 10 MG PO TABS: 10.0000 mg | ORAL_TABLET | Freq: Every day | ORAL | 2 refills | Status: DC

## 2017-09-11 NOTE — Progress Notes (Signed)
Pre visit review using our clinic review tool, if applicable. No additional management support is needed unless otherwise documented below in the visit note. 

## 2017-09-11 NOTE — Patient Instructions (Signed)
Claritin (loratadine), Allegra (fexofenadine), Zyrtec (cetirizine); these are listed in order from weakest to strongest. Generic, and therefore cheaper, options are in the parentheses.   There are available OTC, and the generic versions, which may be cheaper, are in parentheses. Show this to a pharmacist if you have trouble finding any of these items.  Expect results for sputum culture next week sometime.   Go back on your Zantac.   Let us know if you need anything.

## 2017-09-11 NOTE — Progress Notes (Signed)
Chief Complaint  Patient presents with  . Cough    for 5 weeks    [name] is 71 y.o. and is here for a cough.  Duration: 6 weeks  He believes he may have been exposed to black mold in the motor home he was cleaning out. Productive? Yes Associated symptoms: nasal congestion, rhinorrhea (drainage into throat), drainage from ears (he is currently following with ENT who cultured his ear drainage on 11/28.) Denies: fever, night sweats, nasal congestion, sore throat, chest pain, hemoptysis, dyspnea, wheezing Hx of GERD? Yes; cough gets worse after meals/drinking, has not been taking Zantac.  ACEi? No  Therapies tried: Doxycycline x 2, nitrofurantoin was given for a UTI, chlorpheniramine (which has been helpful)  ROS:  Resp: +Cough Cardio: No chest pain  Past Medical History:  Diagnosis Date  . Allergy   . Arthritis   . CAD (coronary artery disease)    s/p PTCI in 2003 and 2004 to the LAD.   Marland Kitchen Chronic tubotympanic suppurative otitis media, bilateral   . Depression   . Diabetes mellitus   . Eustachian tube dysfunction   . History of placement of ear tubes   . Hyperlipidemia   . Hypertension   . Hypothyroid   . Internal hemorrhoids   . Migraine headache   . Skin cancer, basal cell   . Thrombocytopenia ([place])   . Thrombocytopenia, unspecified ([place]) 05/08/2013  . TIA (transient ischemic attack) 2006   per patient's report.  He was never officially given this diagnosis   Family History  Problem Relation Age of Onset  . Mental illness Mother        alzheimers  . Heart disease Mother   . Heart disease Father        triple by pass  . Hypertension Brother        Prostate Cancer  . Prostate cancer Brother   . Hypertension Brother   . Hypertension Brother   . COPD Brother   . Colon cancer Neg Hx   . Colon polyps Neg Hx   . Rectal cancer Neg Hx   . Stomach cancer Neg Hx    Allergies as of 09/11/2017      Reactions   Sulfa Antibiotics Other (See Comments)   Sulfamethoxazole-trimethoprim Nausea And Vomiting   Codeine    Crestor [rosuvastatin Calcium]    Hydrocodone-acetaminophen    vicodin   Other    Apples and tomatoes...... Nausea and migraines.    Rosuvastatin    REACTION: REALLY BAD JOINT/MUSCLE PAIN      Medication List        Accurate as of 09/11/17 11:53 AM. Always use your most recent med list.          aspirin 81 MG tablet Take 81 mg by mouth daily.   atorvastatin 40 MG tablet Commonly known as:  LIPITOR Take 1 tablet (40 mg total) by mouth daily.   Biotin 1000 MCG tablet Take 1,000 mcg by mouth daily.   cetirizine 10 MG tablet Commonly known as:  ZYRTEC ALLERGY Take 1 tablet (10 mg total) by mouth daily.   Cinnamon 500 MG capsule Take 1,000 mg by mouth 2 (two) times daily.   CoQ-10 100 MG capsule Take 100 mg by mouth 2 (two) times daily.   ezetimibe 10 MG tablet Commonly known as:  ZETIA Take 1 tablet (10 mg total) daily by mouth.   FEVERFEW PO Take 1 capsule by mouth 3 (three) times daily.   Fish Oil  1200 MG Caps Take 2 capsules by mouth 2 (two) times daily.   fluticasone 50 MCG/ACT nasal spray Commonly known as:  FLONASE Place 2 sprays into both nostrils daily.   Ginger Root 550 MG Caps Take 1 capsule by mouth daily.   levothyroxine 112 MCG tablet Commonly known as:  SYNTHROID, LEVOTHROID Take 1 tablet (112 mcg total) by mouth daily.   losartan-hydrochlorothiazide 100-12.5 MG tablet Commonly known as:  HYZAAR Take 1 tablet by mouth daily.   magnesium 30 MG tablet Take 30 mg by mouth 2 (two) times daily.   metFORMIN 500 MG (MOD) 24 hr tablet Commonly known as:  GLUMETZA Take 1 tablet (500 mg total) by mouth 2 (two) times daily with a meal.   multivitamin per tablet Take 1 tablet by mouth daily.   OVER THE COUNTER MEDICATION Migra-eeze: Take 1 gel capsule two times daily.   piroxicam 10 MG capsule Commonly known as:  FELDENE 1-2 daily prn   propranolol 80 MG tablet Commonly known  as:  INDERAL Take 1 tablet (80 mg total) by mouth 2 (two) times daily.   ranitidine 300 MG tablet Commonly known as:  ZANTAC Take 1 tablet (300 mg total) by mouth at bedtime.   tamsulosin 0.4 MG Caps capsule Commonly known as:  FLOMAX Take 1 capsule (0.4 mg total) by mouth daily. Reported on 09/23/2015   Turmeric 500 MG Caps Take 1 capsule by mouth daily.   UNABLE TO FIND Migra-Eeze       BP 128/80 (BP Location: Left Arm, Patient Position: Sitting, Cuff Size: Normal)   Pulse 75   Temp 97.9 F (36.6 C) (Oral)   Ht 5\' 9"  (1.753 m)   Wt 170 lb 6 oz (77.3 kg)   SpO2 94%   BMI 25.16 kg/m  Gen: Awake, alert, appears stated age HEENT: Ears patent, tube on R, TM neg, no tube on L, some purulent material in canal near TM with some purulent material behind TM, nares patent without D/C, turbinates unremarkable, Pharynx pink without exudate Neck: Supple, no masses or asymmetry, no tenderness Heart: RRR, no bruits, no LE edema Lungs: CTAB, normal effort, no accessory muscle use Psych: Age appropriate judgement and insight, normal mood and affect  Cough - Plan: cetirizine (ZYRTEC ALLERGY) 10 MG tablet, Respiratory or Resp and Sputum Culture  The culture as above for the patient's peace of mind.  I will have him go back on the Zantac as postprandial coughing suggests a reflux etiology.  Additionally, he has been having nasal drainage and potential postnasal drip.  The first generation histamine antagonist has been helpful so we will add a second generation medicine daily.  I also discussed postinfectious cough etiology and prognosis. F/u in 2 weeks if symptoms fail to improve. If he sees me, will consider PFT's vs PPI vs trial SABA.  The patient and his wife voiced understanding and agreement to the plan.  [place], DO 09/11/17 11:53 AM

## 2017-09-12 ENCOUNTER — Ambulatory Visit: Payer: Self-pay | Admitting: Family Medicine

## 2017-09-14 LAB — RESPIRATORY CULTURE OR RESPIRATORY AND SPUTUM CULTURE
MICRO NUMBER: 81354914
RESULT: NORMAL
SPECIMEN QUALITY: ADEQUATE

## 2017-09-19 DIAGNOSIS — H6613 Chronic tubotympanic suppurative otitis media, bilateral: Secondary | ICD-10-CM | POA: Diagnosis not present

## 2017-09-19 DIAGNOSIS — R05 Cough: Secondary | ICD-10-CM | POA: Diagnosis not present

## 2017-09-19 DIAGNOSIS — H6983 Other specified disorders of Eustachian tube, bilateral: Secondary | ICD-10-CM | POA: Diagnosis not present

## 2017-09-24 MED FILL — SHINGRIX 50 MCG SUS: 50 | 1 days supply | Qty: 1 | Fill #0

## 2017-09-25 ENCOUNTER — Ambulatory Visit (INDEPENDENT_AMBULATORY_CARE_PROVIDER_SITE_OTHER): Payer: Medicare Other | Admitting: Family Medicine

## 2017-09-25 ENCOUNTER — Ambulatory Visit: Payer: Medicare Other | Admitting: Family Medicine

## 2017-09-25 ENCOUNTER — Encounter: Payer: Self-pay | Admitting: Family Medicine

## 2017-09-25 VITALS — BP 132/68 | HR 70 | Temp 97.6°F | Ht 69.0 in | Wt 171.5 lb

## 2017-09-25 DIAGNOSIS — R05 Cough: Secondary | ICD-10-CM | POA: Diagnosis not present

## 2017-09-25 DIAGNOSIS — R058 Other specified cough: Secondary | ICD-10-CM

## 2017-09-25 DIAGNOSIS — I2510 Atherosclerotic heart disease of native coronary artery without angina pectoris: Secondary | ICD-10-CM

## 2017-09-25 HISTORY — DX: Other specified cough: R05.8

## 2017-09-25 NOTE — Patient Instructions (Addendum)
Stop Zantac, stay on Zyrtec. OK to try to come off of Zyrtec in the next 1-2 weeks. If your cough comes back, go back on it.  Consider adding omeprazole 20 mg twice daily if the cough after meals does not improve. The general rule of thumb is to go on it for 2 months if it is helpful and then come off of it.  Let us know if you need anything.

## 2017-09-25 NOTE — Progress Notes (Signed)
Chief Complaint  Patient presents with  . Cough    [name] is 71 y.o. and is here for a cough f/u.  Dx'd with possible reflux and PND. Started on Zyrtec and Zantac, doing better, around 90%. Still having cough after meals. Does not believe Zantac is helpful. Nasal passages and drainage much better. No fevers, breathing OK overall.    ROS:  Resp: +Cough  Past Medical History:  Diagnosis Date  . Allergy   . Arthritis   . CAD (coronary artery disease)    s/p PTCI in 2003 and 2004 to the LAD.   Marland Kitchen Chronic tubotympanic suppurative otitis media, bilateral   . Depression   . Diabetes mellitus   . Eustachian tube dysfunction   . History of placement of ear tubes   . Hyperlipidemia   . Hypertension   . Hypothyroid   . Internal hemorrhoids   . Migraine headache   . Skin cancer, basal cell   . Thrombocytopenia ([place])   . Thrombocytopenia, unspecified ([place]) 05/08/2013  . TIA (transient ischemic attack) 2006   per patient's report.  He was never officially given this diagnosis    BP 132/68 (BP Location: Left Arm, Patient Position: Sitting, Cuff Size: Normal)   Pulse 70   Temp 97.6 F (36.4 C) (Oral)   Ht 5\' 9"  (1.753 m)   Wt 171 lb 8 oz (77.8 kg)   SpO2 96%   BMI 25.33 kg/m  Gen: Awake, alert, appears stated age HEENT: Ears neg, nares patent without D/C, turbinates unremarkable, Pharynx pink without exudate Neck: Supple, no masses or asymmetry, no tenderness Heart: RRR, no murmurs, no LE edema Lungs: CTAB, normal effort, no accessory muscle use Psych: Age appropriate judgement and insight, normal mood and affect  Upper airway cough syndrome  Cont Zyrtec. Stop Zantac. Consider adding omeprazole.  F/u w Dr. Etter Sjogren. The patient voiced understanding and agreement to the plan.  [place], DO 09/25/17 3:05 PM

## 2017-09-25 NOTE — Progress Notes (Signed)
Pre visit review using our clinic review tool, if applicable. No additional management support is needed unless otherwise documented below in the visit note. 

## 2017-09-29 ENCOUNTER — Encounter: Payer: Self-pay | Admitting: *Deleted

## 2017-09-29 DIAGNOSIS — H2513 Age-related nuclear cataract, bilateral: Secondary | ICD-10-CM | POA: Diagnosis not present

## 2017-09-29 DIAGNOSIS — H5213 Myopia, bilateral: Secondary | ICD-10-CM | POA: Diagnosis not present

## 2017-09-29 LAB — HM DIABETES EYE EXAM

## 2017-10-05 NOTE — Progress Notes (Deleted)
Subjective:   [name] is a 71 y.o. male who presents for Medicare Annual/Subsequent preventive examination.  Review of Systems: Physical assessment deferred to PCP.   Sleep patterns:  Home Safety/Smoke Alarms: Feels safe in home. Smoke alarms in place.   Male:   CCS- last 08/17/15: recall 5 yrs     PSA-  Lab Results  Component Value Date   PSA 3.42 09/24/2015   PSA 2.64 09/09/2014   PSA 2.14 04/14/2010       Objective:    Vitals: BP (!) 142/82 (BP Location: Left Arm, Cuff Size: Normal)   Pulse 60   Temp (!) 97.4 F (36.3 C) (Oral)   Resp 16   Ht 5\' 9"  (1.753 m)   Wt 176 lb 9.6 oz (80.1 kg)   SpO2 97%   BMI 26.08 kg/m   Body mass index is 26.08 kg/m.  Advanced Directives 05/17/2017 10/06/2016 03/24/2016 08/03/2015  Does Patient Have a Medical Advance Directive? Yes Yes Yes Yes  Type of Paramedic of [place];Living will - [place];Living will [place];Living will  Does patient want to make changes to medical advance directive? - No - Patient declined No - Patient declined -  Copy of [place] in Chart? - - No - copy requested -    Tobacco Social History   Tobacco Use  Smoking Status Former Smoker  . Years: 5.00  . Types: Cigarettes  . Last attempt to quit: 10/10/1965  . Years since quitting: 52.0  Smokeless Tobacco Never Used     Counseling given: Not Answered   Clinical Intake:                       Past Medical History:  Diagnosis Date  . Allergy   . Arthritis   . CAD (coronary artery disease)    s/p PTCI in 2003 and 2004 to the LAD.   Marland Kitchen Chronic tubotympanic suppurative otitis media, bilateral   . Depression   . Diabetes mellitus   . Eustachian tube dysfunction   . History of placement of ear tubes   . Hyperlipidemia   . Hypertension   . Hypothyroid   . Internal hemorrhoids   . Migraine headache   . Skin cancer, basal cell   . Thrombocytopenia  ([place])   . Thrombocytopenia, unspecified ([place]) 05/08/2013  . TIA (transient ischemic attack) 2006   per patient's report.  He was never officially given this diagnosis   Past Surgical History:  Procedure Laterality Date  . COLONOSCOPY  2011  . coronary artery disease status post placement     of drug-eluting stent in the LAD in 2003,eEF 65% then  . fatty tissue removed     from neck 2003  . HERNIA REPAIR  2007  . POLYPECTOMY  2011   +TA  . right toe bone spur surgery    . THYROIDECTOMY, PARTIAL  07/2002   right thyroid  . TONSILLECTOMY     Family History  Problem Relation Age of Onset  . Mental illness Mother        alzheimers  . Heart disease Mother   . Heart disease Father        triple by pass  . Hypertension Brother        Prostate Cancer  . Prostate cancer Brother   . Hypertension Brother   . Hypertension Brother   . COPD Brother   . Colon cancer  Neg Hx   . Colon polyps Neg Hx   . Rectal cancer Neg Hx   . Stomach cancer Neg Hx    Social History   Socioeconomic History  . Marital status: Married    Spouse name: None  . Number of children: 0  . Years of education: None  . Highest education level: None  Social Needs  . Financial resource strain: None  . Food insecurity - worry: None  . Food insecurity - inability: None  . Transportation needs - medical: None  . Transportation needs - non-medical: None  Occupational History  . Occupation: retired    Fish farm manager: RETIRED    Comment: accountant  Tobacco Use  . Smoking status: Former Smoker    Years: 5.00    Types: Cigarettes    Last attempt to quit: 10/10/1965    Years since quitting: 52.0  . Smokeless tobacco: Never Used  Substance and Sexual Activity  . Alcohol use: No    Alcohol/week: 0.0 oz  . Drug use: No  . Sexual activity: Yes    Partners: Female  Other Topics Concern  . None  Social History Narrative   Exercise-- treadmill, stationary bike    Outpatient Encounter Medications as of 10/09/2017    Medication Sig  . aspirin 81 MG tablet Take 81 mg by mouth daily.    Marland Kitchen atorvastatin (LIPITOR) 40 MG tablet Take 1 tablet (40 mg total) by mouth daily.  . Biotin 1000 MCG tablet Take 1,000 mcg by mouth daily.   . cetirizine (ZYRTEC ALLERGY) 10 MG tablet Take 1 tablet (10 mg total) by mouth daily.  . Cinnamon 500 MG capsule Take 1,000 mg by mouth 2 (two) times daily.   . Coenzyme Q10 (COQ-10) 100 MG capsule Take 100 mg by mouth 2 (two) times daily.   Marland Kitchen ezetimibe (ZETIA) 10 MG tablet Take 1 tablet (10 mg total) by mouth daily.  Marland Kitchen FEVERFEW PO Take 1 capsule by mouth 3 (three) times daily.  . fluticasone (FLONASE) 50 MCG/ACT nasal spray Place 2 sprays into both nostrils daily.  . Ginger, Zingiber officinalis, (GINGER ROOT) 550 MG CAPS Take 1 capsule by mouth daily.    Marland Kitchen losartan-hydrochlorothiazide (HYZAAR) 100-12.5 MG tablet Take 1 tablet by mouth daily.  . magnesium 30 MG tablet Take 30 mg by mouth 2 (two) times daily.  . metFORMIN (GLUMETZA) 500 MG (MOD) 24 hr tablet Take 1 tablet (500 mg total) by mouth 2 (two) times daily with a meal.  . multivitamin (THERAGRAN) per tablet Take 1 tablet by mouth daily.    . Omega-3 Fatty Acids (FISH OIL) 1200 MG CAPS Take 2 capsules by mouth 2 (two) times daily.    Marland Kitchen OVER THE COUNTER MEDICATION Migra-eeze: Take 1 gel capsule two times daily.  . piroxicam (FELDENE) 10 MG capsule 1-2 daily prn  . propranolol (INDERAL) 80 MG tablet Take 1 tablet (80 mg total) by mouth 2 (two) times daily.  . tamsulosin (FLOMAX) 0.4 MG CAPS capsule Take 1 capsule (0.4 mg total) by mouth daily. Reported on 09/23/2015  . Turmeric 500 MG CAPS Take 1 capsule by mouth daily.  Marland Kitchen UNABLE TO FIND Migra-Eeze  . [DISCONTINUED] cetirizine (ZYRTEC ALLERGY) 10 MG tablet Take 1 tablet (10 mg total) by mouth daily.  . [DISCONTINUED] ezetimibe (ZETIA) 10 MG tablet Take 1 tablet (10 mg total) daily by mouth.  . [DISCONTINUED] fluticasone (FLONASE) 50 MCG/ACT nasal spray Place 2 sprays into both  nostrils daily.  . [DISCONTINUED] levothyroxine (SYNTHROID, LEVOTHROID)  112 MCG tablet Take 1 tablet (112 mcg total) by mouth daily.  . [DISCONTINUED] metFORMIN (GLUMETZA) 500 MG (MOD) 24 hr tablet Take 1 tablet (500 mg total) by mouth 2 (two) times daily with a meal.  . [DISCONTINUED] piroxicam (FELDENE) 10 MG capsule 1-2 daily prn  . levothyroxine (SYNTHROID) 112 MCG tablet Take 1 tablet (112 mcg total) by mouth daily before breakfast.   No facility-administered encounter medications on file as of 10/09/2017.     Activities of Daily Living No flowsheet data found.  Patient Care Team: Carollee Herter, Alferd Apa, DO as PCP - General Anda Kraft, MD as Consulting Physician (Endocrinology) Druscilla Brownie, MD as Consulting Physician (Dermatology) Stanford Breed, Denice Bors, MD as Consulting Physician (Cardiology) Raynelle Bring, MD as Consulting Physician (Urology) Melissa Montane, MD as Consulting Physician (Otolaryngology) Ladene Artist, MD as Consulting Physician (Gastroenterology) Luberta Mutter, MD as Consulting Physician (Ophthalmology)   Assessment:   This is a routine wellness examination for [place].  Exercise Activities and Dietary recommendations    Goals    None      Fall Risk Fall Risk  07/25/2017 01/17/2017 10/06/2016 03/24/2016 09/24/2015  Falls in the past year? No No No No No    Depression Screen PHQ 2/9 Scores 10/06/2016 09/24/2015 09/09/2014 08/27/2013  PHQ - 2 Score 6 0 0 0  PHQ- 9 Score 9 - - -    Cognitive Function MMSE - Mini Mental State Exam 10/06/2016  Orientation to time 5  Orientation to Place 5  Registration 3  Attention/ Calculation 5  Recall 3  Language- name 2 objects 2  Language- repeat 1  Language- follow 3 step command 3  Language- read & follow direction 1  Write a sentence 1  Copy design 1  Total score 30        Immunization History  Administered Date(s) Administered  . Influenza Split 07/26/2011  . Influenza Whole 08/30/2007,  07/08/2009, 07/06/2010  . Influenza, High Dose Seasonal PF 07/11/2014, 10/06/2016, 07/23/2017  . Influenza,inj,Quad PF,6+ Mos 07/05/2013  . Influenza-Unspecified 08/19/2015  . Pneumococcal Conjugate-13 12/17/2013  . Pneumococcal Polysaccharide-23 04/23/2003, 07/08/2009  . Td 02/19/2003  . Tdap 09/19/2013  . Zoster 02/21/2007  . Zoster Recombinat (Shingrix) 07/25/2017, 09/26/2017    Qualifies for Shingles Vaccine? ***  Screening Tests Health Maintenance  Topic Date Due  . OPHTHALMOLOGY EXAM  09/29/2017  . HEMOGLOBIN A1C  12/24/2017  . FOOT EXAM  04/06/2018  . COLONOSCOPY  08/16/2020  . TETANUS/TDAP  09/20/2023  . INFLUENZA VACCINE  Completed  . Hepatitis C Screening  Completed  . PNA vac Low Risk Adult  Completed      Plan:   ***  I have personally reviewed and noted the following in the patient's chart:   . Medical and social history . Use of alcohol, tobacco or illicit drugs  . Current medications and supplements . Functional ability and status . Nutritional status . Physical activity . Advanced directives . List of other physicians . Hospitalizations, surgeries, and ER visits in previous 12 months . Vitals . Screenings to include cognitive, depression, and falls . Referrals and appointments  In addition, I have reviewed and discussed with patient certain preventive protocols, quality metrics, and best practice recommendations. A written personalized care plan for preventive services as well as general preventive health recommendations were provided to patient.     [place], DO  10/09/2017

## 2017-10-09 ENCOUNTER — Encounter: Payer: Self-pay | Admitting: Family Medicine

## 2017-10-09 ENCOUNTER — Ambulatory Visit: Payer: Medicare Other | Admitting: Family Medicine

## 2017-10-09 ENCOUNTER — Ambulatory Visit (INDEPENDENT_AMBULATORY_CARE_PROVIDER_SITE_OTHER): Payer: Medicare Other | Admitting: Family Medicine

## 2017-10-09 VITALS — BP 142/82 | HR 60 | Temp 97.4°F | Resp 16 | Ht 69.0 in | Wt 176.6 lb

## 2017-10-09 DIAGNOSIS — J301 Allergic rhinitis due to pollen: Secondary | ICD-10-CM

## 2017-10-09 DIAGNOSIS — E785 Hyperlipidemia, unspecified: Secondary | ICD-10-CM

## 2017-10-09 DIAGNOSIS — IMO0002 Reserved for concepts with insufficient information to code with codable children: Secondary | ICD-10-CM

## 2017-10-09 DIAGNOSIS — I2510 Atherosclerotic heart disease of native coronary artery without angina pectoris: Secondary | ICD-10-CM | POA: Diagnosis not present

## 2017-10-09 DIAGNOSIS — E039 Hypothyroidism, unspecified: Secondary | ICD-10-CM

## 2017-10-09 DIAGNOSIS — E1165 Type 2 diabetes mellitus with hyperglycemia: Secondary | ICD-10-CM

## 2017-10-09 DIAGNOSIS — R05 Cough: Secondary | ICD-10-CM | POA: Diagnosis not present

## 2017-10-09 DIAGNOSIS — R059 Cough, unspecified: Secondary | ICD-10-CM

## 2017-10-09 DIAGNOSIS — I10 Essential (primary) hypertension: Secondary | ICD-10-CM

## 2017-10-09 DIAGNOSIS — E1151 Type 2 diabetes mellitus with diabetic peripheral angiopathy without gangrene: Secondary | ICD-10-CM | POA: Diagnosis not present

## 2017-10-09 DIAGNOSIS — M159 Polyosteoarthritis, unspecified: Secondary | ICD-10-CM

## 2017-10-09 DIAGNOSIS — G43909 Migraine, unspecified, not intractable, without status migrainosus: Secondary | ICD-10-CM | POA: Diagnosis not present

## 2017-10-09 DIAGNOSIS — E1159 Type 2 diabetes mellitus with other circulatory complications: Secondary | ICD-10-CM

## 2017-10-09 MED ORDER — FLUTICASONE PROPIONATE 50 MCG/ACT NA SUSP: 2.0000 | Freq: Every day | NASAL | 1 refills | Status: DC

## 2017-10-09 MED ORDER — PIROXICAM 10 MG PO CAPS: ORAL_CAPSULE | ORAL | 1 refills | Status: DC

## 2017-10-09 MED ORDER — LEVOTHYROXINE SODIUM 112 MCG PO TABS: 112.0000 ug | ORAL_TABLET | Freq: Every day | ORAL | 1 refills | Status: DC

## 2017-10-09 MED ORDER — CETIRIZINE HCL 10 MG PO TABS: 10.0000 mg | ORAL_TABLET | Freq: Every day | ORAL | 1 refills | Status: DC

## 2017-10-09 MED ORDER — EZETIMIBE 10 MG PO TABS: 10.0000 mg | ORAL_TABLET | Freq: Every day | ORAL | 1 refills | Status: DC

## 2017-10-09 MED ORDER — METFORMIN HCL ER (MOD) 500 MG PO TB24: 500.0000 mg | ORAL_TABLET | Freq: Two times a day (BID) | ORAL | 1 refills | Status: DC

## 2017-10-09 NOTE — Patient Instructions (Signed)

## 2017-10-09 NOTE — Assessment & Plan Note (Signed)
Well controlled, no changes to meds. Encouraged heart healthy diet such as the DASH diet and exercise as tolerated.  °

## 2017-10-09 NOTE — Assessment & Plan Note (Signed)
hgba1c to be checked, minimize simple carbs. Increase exercise as tolerated. Continue current meds  

## 2017-10-09 NOTE — Assessment & Plan Note (Signed)
Check labs con't meds 

## 2017-10-09 NOTE — Progress Notes (Signed)
Patient ID: [name], male    DOB: 18-Aug-1946  Age: 71 y.o. MRN: [id number]    Subjective:  Subjective  HPI [name] presents for dm , cholesterol and htn.  No complaints.    HPI HYPERTENSION   Blood pressure range-not checking   Chest pain- no      Dyspnea- no Lightheadedness- no   Edema- no  Other side effects - no   Medication compliance: good Low salt diet- yes     DIABETES    Blood Sugar ranges-not checking   Polyuria- no New Visual problems- no  Hypoglycemic symptoms- no  Other side effects-no Medication compliance - good  Last eye exam- Friday a week ago  Foot exam-  today   HYPERLIPIDEMIA  Medication compliance- good RUQ pain- no  Muscle aches- no Other side effects-no    Review of Systems  Constitutional: Negative for chills and fever.  HENT: Negative for congestion and hearing loss.   Eyes: Negative for discharge.  Respiratory: Negative for cough and shortness of breath.   Cardiovascular: Negative for chest pain, palpitations and leg swelling.  Gastrointestinal: Negative for abdominal pain, blood in stool, constipation, diarrhea, nausea and vomiting.  Genitourinary: Negative for dysuria, frequency, hematuria and urgency.  Musculoskeletal: Negative for back pain and myalgias.  Skin: Negative for rash.  Allergic/Immunologic: Negative for environmental allergies.  Neurological: Negative for dizziness, weakness and headaches.  Hematological: Does not bruise/bleed easily.  Psychiatric/Behavioral: Negative for suicidal ideas. The patient is not nervous/anxious.     History Past Medical History:  Diagnosis Date  . Allergy   . Arthritis   . CAD (coronary artery disease)    s/p PTCI in 2003 and 2004 to the LAD.   Marland Kitchen Chronic tubotympanic suppurative otitis media, bilateral   . Depression   . Diabetes mellitus   . Eustachian tube dysfunction   . History of placement of ear tubes   . Hyperlipidemia   . Hypertension   . Hypothyroid   . Internal  hemorrhoids   . Migraine headache   . Skin cancer, basal cell   . Thrombocytopenia ([place])   . Thrombocytopenia, unspecified ([place]) 05/08/2013  . TIA (transient ischemic attack) 2006   per patient's report.  He was never officially given this diagnosis    He has a past surgical history that includes Hernia repair (2007); fatty tissue removed; Thyroidectomy, partial (07/2002); coronary artery disease status post placement; right toe bone spur surgery; Tonsillectomy; Colonoscopy (2011); and Polypectomy (2011).   His family history includes COPD in his brother; Heart disease in his father and mother; Hypertension in his brother, brother, and brother; Mental illness in his mother; Prostate cancer in his brother.He reports that he quit smoking about 52 years ago. His smoking use included cigarettes. He quit after 5.00 years of use. he has never used smokeless tobacco. He reports that he does not drink alcohol or use drugs.  Current Outpatient Medications on File Prior to Visit  Medication Sig Dispense Refill  . aspirin 81 MG tablet Take 81 mg by mouth daily.      Marland Kitchen atorvastatin (LIPITOR) 40 MG tablet Take 1 tablet (40 mg total) by mouth daily. 30 tablet 11  . Biotin 1000 MCG tablet Take 1,000 mcg by mouth daily.     . Cinnamon 500 MG capsule Take 1,000 mg by mouth 2 (two) times daily.     . Coenzyme Q10 (COQ-10) 100 MG capsule Take 100 mg by mouth 2 (two) times daily.     Marland Kitchen  FEVERFEW PO Take 1 capsule by mouth 3 (three) times daily.    . Ginger, Zingiber officinalis, (GINGER ROOT) 550 MG CAPS Take 1 capsule by mouth daily.      Marland Kitchen losartan-hydrochlorothiazide (HYZAAR) 100-12.5 MG tablet Take 1 tablet by mouth daily. 30 tablet 11  . magnesium 30 MG tablet Take 30 mg by mouth 2 (two) times daily.    . multivitamin (THERAGRAN) per tablet Take 1 tablet by mouth daily.      . Omega-3 Fatty Acids (FISH OIL) 1200 MG CAPS Take 2 capsules by mouth 2 (two) times daily.      Marland Kitchen OVER THE COUNTER MEDICATION  Migra-eeze: Take 1 gel capsule two times daily.    . propranolol (INDERAL) 80 MG tablet Take 1 tablet (80 mg total) by mouth 2 (two) times daily. 60 tablet 11  . tamsulosin (FLOMAX) 0.4 MG CAPS capsule Take 1 capsule (0.4 mg total) by mouth daily. Reported on 09/23/2015 30 capsule   . Turmeric 500 MG CAPS Take 1 capsule by mouth daily.    Marland Kitchen UNABLE TO FIND Migra-Eeze     No current facility-administered medications on file prior to visit.      Objective:  Objective  Physical Exam  Constitutional: He is oriented to person, place, and time. Vital signs are normal. He appears well-developed and well-nourished. He is sleeping.  HENT:  Head: Normocephalic and atraumatic.  Mouth/Throat: Oropharynx is clear and moist.  Eyes: EOM are normal. Pupils are equal, round, and reactive to light.  Neck: Normal range of motion. Neck supple. No thyromegaly present.  Cardiovascular: Normal rate and regular rhythm.  No murmur heard. Pulmonary/Chest: Effort normal and breath sounds normal. No respiratory distress. He has no wheezes. He has no rales. He exhibits no tenderness.  Musculoskeletal: He exhibits no edema or tenderness.  Neurological: He is alert and oriented to person, place, and time.  Skin: Skin is warm and dry.  Psychiatric: He has a normal mood and affect. His behavior is normal. Judgment and thought content normal.  Nursing note and vitals reviewed. Sensory exam of the foot is normal, tested with the monofilament. Good pulses, no lesions or ulcers, good peripheral pulses.  BP (!) 142/82 (BP Location: Left Arm, Cuff Size: Normal)   Pulse 60   Temp (!) 97.4 F (36.3 C) (Oral)   Resp 16   Ht 5\' 9"  (1.753 m)   Wt 176 lb 9.6 oz (80.1 kg)   SpO2 97%   BMI 26.08 kg/m  Wt Readings from Last 3 Encounters:  10/09/17 176 lb 9.6 oz (80.1 kg)  09/25/17 171 lb 8 oz (77.8 kg)  09/11/17 170 lb 6 oz (77.3 kg)     Lab Results  Component Value Date   WBC 6.6 05/17/2017   HGB 14.5 05/17/2017    HCT 39.5 05/17/2017   PLT 107 (L) 05/17/2017   GLUCOSE 106 (H) 06/26/2017   CHOL 80 04/06/2017   TRIG 72.0 04/06/2017   HDL 37.60 (L) 04/06/2017   LDLCALC 28 04/06/2017   ALT 24 04/06/2017   AST 25 04/06/2017   NA 138 06/26/2017   K 3.8 06/26/2017   CL 104 06/26/2017   CREATININE 1.28 06/26/2017   BUN 21 06/26/2017   CO2 29 06/26/2017   TSH 1.29 04/06/2017   PSA 3.42 09/24/2015   INR 1.0 RATIO 12/11/2008   HGBA1C 5.3 06/26/2017   MICROALBUR 2.5 (H) 09/24/2015    Dg Chest 2 View  Result Date: 09/02/2017 CLINICAL DATA:  Productive  cough. EXAM: CHEST  2 VIEW COMPARISON:  Radiographs of May 18, 2017. FINDINGS: The heart size and mediastinal contours are within normal limits. No pneumothorax or pleural effusion is noted. No acute pulmonary disease is noted. Stable right upper lobe scarring. The visualized skeletal structures are unremarkable. IMPRESSION: No active cardiopulmonary disease. Electronically Signed   By: Marijo Conception, M.D.   On: 09/02/2017 15:50     Assessment & Plan:  Plan  I have discontinued Broadus John A. Conkey's levothyroxine. I have also changed his ezetimibe. Additionally, I am having him start on levothyroxine. Lastly, I am having him maintain his aspirin, Biotin, multivitamin, Fish Oil, CoQ-10, Cinnamon, Ginger Root, Turmeric, magnesium, tamsulosin, FEVERFEW PO, UNABLE TO FIND, propranolol, losartan-hydrochlorothiazide, atorvastatin, OVER THE COUNTER MEDICATION, cetirizine, piroxicam, fluticasone, and metFORMIN.  Meds ordered this encounter  Medications  . cetirizine (ZYRTEC ALLERGY) 10 MG tablet    Sig: Take 1 tablet (10 mg total) by mouth daily.    Dispense:  90 tablet    Refill:  1  . piroxicam (FELDENE) 10 MG capsule    Sig: 1-2 daily prn    Dispense:  200 capsule    Refill:  1  . fluticasone (FLONASE) 50 MCG/ACT nasal spray    Sig: Place 2 sprays into both nostrils daily.    Dispense:  48 g    Refill:  1  . ezetimibe (ZETIA) 10 MG tablet    Sig: Take  1 tablet (10 mg total) by mouth daily.    Dispense:  90 tablet    Refill:  1  . metFORMIN (GLUMETZA) 500 MG (MOD) 24 hr tablet    Sig: Take 1 tablet (500 mg total) by mouth 2 (two) times daily with a meal.    Dispense:  180 tablet    Refill:  1  . levothyroxine (SYNTHROID) 112 MCG tablet    Sig: Take 1 tablet (112 mcg total) by mouth daily before breakfast.    Dispense:  90 tablet    Refill:  1    Problem List Items Addressed This Visit      Unprioritized   DM (diabetes mellitus) type II uncontrolled, periph vascular disorder ([place]) - Primary    hgba1c to be checked, minimize simple carbs. Increase exercise as tolerated. Continue current meds      Relevant Medications   ezetimibe (ZETIA) 10 MG tablet   metFORMIN (GLUMETZA) 500 MG (MOD) 24 hr tablet   Other Relevant Orders   Hemoglobin A1c   Comprehensive metabolic panel   Essential hypertension    Well controlled, no changes to meds. Encouraged heart healthy diet such as the DASH diet and exercise as tolerated.       Relevant Medications   ezetimibe (ZETIA) 10 MG tablet   Hyperlipidemia LDL goal <70   Relevant Medications   ezetimibe (ZETIA) 10 MG tablet   Other Relevant Orders   Lipid panel   Comprehensive metabolic panel   Hypothyroidism    Check labs con't meds      Relevant Medications   levothyroxine (SYNTHROID) 112 MCG tablet   Other Relevant Orders   TSH    Other Visit Diagnoses    Cough       Osteoarthritis of multiple joints, unspecified osteoarthritis type       Relevant Medications   piroxicam (FELDENE) 10 MG capsule   Migraine without status migrainosus, not intractable, unspecified migraine type       Relevant Medications   piroxicam (FELDENE) 10 MG capsule  ezetimibe (ZETIA) 10 MG tablet   Chronic seasonal allergic rhinitis due to pollen       Relevant Medications   cetirizine (ZYRTEC ALLERGY) 10 MG tablet   fluticasone (FLONASE) 50 MCG/ACT nasal spray   Type 2 diabetes mellitus with vascular  disease ([facility])       Relevant Medications   ezetimibe (ZETIA) 10 MG tablet   metFORMIN (GLUMETZA) 500 MG (MOD) 24 hr tablet   Other Relevant Orders   Hemoglobin A1c      Follow-up: Return in about 6 months (around 04/08/2018), or if symptoms worsen or fail to improve, for hypertension, hyperlipidemia, diabetes II, fasting.  [name], DO

## 2017-10-10 LAB — COMPREHENSIVE METABOLIC PANEL
AG Ratio: 1.3 (calc) (ref 1.0–2.5)
ALBUMIN MSPROF: 3.8 g/dL (ref 3.6–5.1)
ALKALINE PHOSPHATASE (APISO): 75 U/L (ref 40–115)
ALT: 26 U/L (ref 9–46)
AST: 26 U/L (ref 10–35)
BILIRUBIN TOTAL: 0.6 mg/dL (ref 0.2–1.2)
BUN/Creatinine Ratio: 16 (calc) (ref 6–22)
BUN: 19 mg/dL (ref 7–25)
CALCIUM: 8.7 mg/dL (ref 8.6–10.3)
CO2: 28 mmol/L (ref 20–32)
CREATININE: 1.2 mg/dL — AB (ref 0.70–1.18)
Chloride: 104 mmol/L (ref 98–110)
Globulin: 3 g/dL (calc) (ref 1.9–3.7)
Glucose, Bld: 101 mg/dL — ABNORMAL HIGH (ref 65–99)
POTASSIUM: 4.1 mmol/L (ref 3.5–5.3)
Sodium: 140 mmol/L (ref 135–146)
TOTAL PROTEIN: 6.8 g/dL (ref 6.1–8.1)

## 2017-10-10 LAB — LIPID PANEL
CHOLESTEROL: 101 mg/dL (ref <200)
HDL: 51 mg/dL (ref >40)
LDL Cholesterol (Calc): 37 mg/dL (calc)
Non-HDL Cholesterol (Calc): 50 mg/dL (calc) (ref <130)
Total CHOL/HDL Ratio: 2 (calc) (ref <5.0)
Triglycerides: 52 mg/dL (ref <150)

## 2017-10-10 LAB — HEMOGLOBIN A1C
EAG (MMOL/L): 6.3 (calc)
HEMOGLOBIN A1C: 5.6 %{Hb} (ref <5.7)
MEAN PLASMA GLUCOSE: 114 (calc)

## 2017-10-10 LAB — SPECIMEN COMPROMISED

## 2017-10-10 LAB — TSH: TSH: 1.31 mIU/L (ref 0.40–4.50)

## 2017-10-11 MED ORDER — METFORMIN HCL 500 MG PO TABS: 500.0000 mg | ORAL_TABLET | Freq: Two times a day (BID) | ORAL | 1 refills | Status: DC

## 2017-10-16 ENCOUNTER — Ambulatory Visit (INDEPENDENT_AMBULATORY_CARE_PROVIDER_SITE_OTHER): Payer: Medicare Other | Admitting: Neurology

## 2017-10-16 DIAGNOSIS — G44011 Episodic cluster headache, intractable: Secondary | ICD-10-CM

## 2017-10-16 DIAGNOSIS — R0683 Snoring: Secondary | ICD-10-CM

## 2017-10-16 DIAGNOSIS — R351 Nocturia: Secondary | ICD-10-CM

## 2017-10-16 DIAGNOSIS — G4761 Periodic limb movement disorder: Secondary | ICD-10-CM

## 2017-10-16 DIAGNOSIS — G250 Essential tremor: Secondary | ICD-10-CM

## 2017-10-16 DIAGNOSIS — I2510 Atherosclerotic heart disease of native coronary artery without angina pectoris: Secondary | ICD-10-CM

## 2017-10-20 ENCOUNTER — Telehealth: Payer: Self-pay | Admitting: *Deleted

## 2017-10-20 DIAGNOSIS — R351 Nocturia: Secondary | ICD-10-CM

## 2017-10-20 NOTE — Procedures (Signed)
PATIENT'S NAME:  [name], Kadlec DOB:      11/07/1945      MR#:    [id number]     DATE OF RECORDING: 10/16/2017- Barrie Lyme, RPSGT REFERRING M.D.:  [place], D.O. Study Performed:   Baseline Polysomnogram HISTORY:  72 year old male, seen here as in a referral from Dr. Tomi Likens for a sleep evaluation after developing chronic migraines, cluster headaches and excessive daytime sleepiness.   Dx: includes osteoarthritis, coronary artery disease with stent placement in 2003 and 2004, diabetes mellitus, and hyperlipidemia, hypertension, hypothyroidism, internal hemorrhoids, thrombocytopenia and a TIA in 2006.   Chief complaint according to patient: He wakes up with headaches frequently, but headaches do not necessarily wake him out of sleep.  He wakes up about every 2 hours or less due to nocturia, interrupting his sleep frequently.  His wife has noticed that he snores, not sure if she ever witnessed apneas.  There is some crescendo snoring noted. He wakes non-restored with non migrainous headaches- cluster type, right or left side, temple and forehead. A sharp stabbing headache through the eye.   The patient endorsed the Epworth Sleepiness Scale at 12 points.   The patient's weight 175 pounds with a height of 69 (inches), resulting in a BMI of 25.8 kg/m2. The patient's neck circumference measured 15 inches.  CURRENT MEDICATIONS: Lipitor, Biotin, Cinnamon, Co-Q10, Doxycycline, Zetia, Flonase, Ginger root, Synthroid, Hyzaar, Metformin, Macro bid, Feldene, Inderal, Zantac, Flomax   PROCEDURE:  This is a multichannel digital polysomnogram utilizing the Somnostar 11.2 system.  Electrodes and sensors were applied and monitored per AASM Specifications.   EEG, EOG, Chin and Limb EMG, were sampled at 200 Hz.  ECG, Snore and Nasal Pressure, Thermal Airflow, Respiratory Effort, CPAP Flow and Pressure, Oximetry was sampled at 50 Hz. Digital video and audio were recorded.      BASELINE STUDY: Lights Out was  at 22:52 and Lights On at 04:53.  Total recording time (TRT) was 361 minutes, with a total sleep time (TST) of 214.5 minutes.  The patient's sleep latency was 15 minutes.  REM latency was 270.5 minutes.  The sleep efficiency was 59.4 %.     SLEEP ARCHITECTURE: WASO (Wake after sleep onset) was 123 minutes.  There were 11 minutes in Stage N1, 133.5 minutes Stage N2, 44 minutes Stage N3 and 26 minutes in Stage REM.  The percentage of Stage N1 was 5.1%, Stage N2 was 62.2%, Stage N3 was 20.5% and Stage R (REM sleep) was 12.1%.   RESPIRATORY ANALYSIS:  There were a total of 6 respiratory events:  1 obstructive apnea, 3 central apneas and 0 mixed apneas with a total of 4 apneas and an apnea index (AI) of 1.1 /hour. There were 2 hypopneas with a hypopnea index of .6 /hour. The patient also had 0 respiratory event related arousals (RERAs).      The total APNEA/HYPOPNEA INDEX (AHI) was 1.7/hour and the total RESPIRATORY DISTURBANCE INDEX was 1.7 /hour.  4 events occurred in REM sleep and 4 events in NREM. The REM AHI was 9.2 /hour, versus a non-REM AHI of 0.6. The patient spent 47.5 minutes of total sleep time in the supine position and 167 minutes in non-supine. The supine AHI was 1.3 versus a non-supine AHI of 1.8/hr.  OXYGEN SATURATION & C02:  The Wake baseline 02 saturation was 86%, with the lowest being 89%. Time spent below 89% saturation equaled 0.1 minutes. Average End Tidal CO2 during sleep was 36.7 torr.  During  REM, peak End Tidal CO2 was 37.3 torr.  During NREM, End Tidal CO2 was 36.7 torr.  Total sleep time greater than 40 torr was 1.40 minutes. No hypercapnia greater than 50 torr.   PERIODIC LIMB MOVEMENTS:   The patient had a total of 76 Periodic Limb Movements.  The Periodic Limb Movement (PLM) index was 21.3 and the PLM Arousal index was 2.5/hour. The arousals were noted as: 17 were spontaneous, 9 were associated with PLMs, and only 2 were associated with respiratory events. Audio and video  analysis did not show any abnormal or unusual movements, behaviors, phonations or vocalizations.   The patient took seven (7) bathroom breaks. Mild Snoring was noted.EKG was in keeping with normal sinus rhythm (NSR).  Post-study, the patient indicated that sleep was similar to his usual sleep.   IMPRESSION:  1. Clinically insignificant degree of Obstructive Sleep Apnea(OSA) 2. Neither hypoxemia nor hypercapnia were noted. 3. Periodic Limb Movement Disorder (PLMD) 4. Severe Nocturia  RECOMMENDATIONS:  1. There was no Apnea noted that requires intervention. Apnea is also not a cause for nocturia.  2.  There were moderate periodic limb movements of sleep (PLMS), some associated with sleep disruption.   3. Nocturia- Avoid caffeine-containing beverages and chocolate. Primary nocturia treatment through Urology.  4. A follow up appointment can be scheduled in the Sleep Clinic at [facility] if the patient desires. The severe sleep fragmentation was clearly r4elated to Nocturia and not to Apnea.   The referring provider will be notified of the results.    I certify that I have reviewed the entire raw data recording prior to the issuance of this report in accordance with the Standards of Accreditation of the American Academy of Sleep Medicine (AASM)  Larey Seat, MD      10-20-2017  Diplomat, American Board of Psychiatry and Neurology  Diplomat, American Board of Sleep Medicine Medical Director of [facility] [employer]

## 2017-10-20 NOTE — Telephone Encounter (Signed)
See sleep study result and recommendation. Referral placed to urology.

## 2017-10-23 ENCOUNTER — Other Ambulatory Visit: Payer: Self-pay

## 2017-10-23 DIAGNOSIS — R351 Nocturia: Principal | ICD-10-CM

## 2017-10-23 DIAGNOSIS — N401 Enlarged prostate with lower urinary tract symptoms: Secondary | ICD-10-CM

## 2017-10-25 ENCOUNTER — Telehealth: Payer: Self-pay | Admitting: Neurology

## 2017-10-25 ENCOUNTER — Encounter: Payer: Self-pay | Admitting: Family Medicine

## 2017-10-25 ENCOUNTER — Encounter: Payer: Self-pay | Admitting: *Deleted

## 2017-10-25 NOTE — Telephone Encounter (Signed)
-----   Message from Larey Seat, MD sent at 10/20/2017 12:32 PM EST ----- This attended sleep study revealed neither apnea, not hypoxemia nor hypercapnia and gave no evidence that headaches or nocturia are sleep related.  Nocturia was severe ! This caused sleep fragmentation to a degree that needs intervention

## 2017-10-25 NOTE — Telephone Encounter (Signed)
Called the patient and spoke with the wife and made her aware of the report. I also informed the wife that the PCP has placed a referral for urology for the patient. The patients wife states that the patient already has a apt with a urologist that he is already established with Dr Alinda Money. I have sent a copy of the sleep report to that urologist per wife. Wife verbalized understanding

## 2017-11-04 ENCOUNTER — Encounter: Payer: Self-pay | Admitting: Family Medicine

## 2017-11-04 DIAGNOSIS — R351 Nocturia: Secondary | ICD-10-CM

## 2017-11-06 NOTE — Telephone Encounter (Signed)
We can get it here if that is easier --- ok to order psa

## 2017-11-06 NOTE — Telephone Encounter (Signed)
We can get it here -- ok to put order in for psa

## 2017-11-09 DIAGNOSIS — M47816 Spondylosis without myelopathy or radiculopathy, lumbar region: Secondary | ICD-10-CM | POA: Diagnosis not present

## 2017-11-13 ENCOUNTER — Other Ambulatory Visit: Payer: Self-pay | Admitting: Cardiology

## 2017-11-13 DIAGNOSIS — M545 Low back pain: Secondary | ICD-10-CM | POA: Diagnosis not present

## 2017-11-13 NOTE — Telephone Encounter (Signed)
Rx(s) sent to pharmacy electronically.  

## 2017-11-14 ENCOUNTER — Other Ambulatory Visit (INDEPENDENT_AMBULATORY_CARE_PROVIDER_SITE_OTHER): Payer: Medicare Other

## 2017-11-14 DIAGNOSIS — R351 Nocturia: Secondary | ICD-10-CM

## 2017-11-15 LAB — PSA: PSA: 7.35 ng/mL — ABNORMAL HIGH (ref 0.10–4.00)

## 2017-11-16 NOTE — Progress Notes (Signed)
HPI: FU CAD with prior PCI of his LAD in May 2003. His last cardiac catheterization was performed on December 16, 2008 by Dr. Olevia Perches for exertional chest pain. He was found to have a 30-40% LAD just distal to the stent. There was no other obstructive disease noted. The ejection fraction is 50%. He has been treated medically. Abdominal ultrasound in Oct 2011 showed no aneurysm. Nuclear study in January of 2014 showed an ejection fraction of 61%. There was no ischemia. Since I last saw him, the patient has dyspnea with more extreme activities but not with routine activities. It is relieved with rest. It is not associated with chest pain. There is no orthopnea, PND or pedal edema. There is no syncope or palpitations. There is no exertional chest pain.   Current Outpatient Medications  Medication Sig Dispense Refill  . aspirin 81 MG tablet Take 81 mg by mouth daily.      Marland Kitchen atorvastatin (LIPITOR) 40 MG tablet TAKE 1 TABLET(40 MG) BY MOUTH DAILY 30 tablet 0  . Biotin 1000 MCG tablet Take 1,000 mcg by mouth daily.     . cetirizine (ZYRTEC ALLERGY) 10 MG tablet Take 1 tablet (10 mg total) by mouth daily. 90 tablet 1  . Cinnamon 500 MG capsule Take 1,000 mg by mouth 2 (two) times daily.     . Coenzyme Q10 (COQ-10) 100 MG capsule Take 100 mg by mouth 2 (two) times daily.     Marland Kitchen ezetimibe (ZETIA) 10 MG tablet Take 1 tablet (10 mg total) by mouth daily. 90 tablet 1  . FEVERFEW PO Take 1 capsule by mouth 3 (three) times daily.    . fluticasone (FLONASE) 50 MCG/ACT nasal spray Place 2 sprays into both nostrils daily. (Patient taking differently: Place 2 sprays into both nostrils daily as needed. ) 48 g 1  . Ginger, Zingiber officinalis, (GINGER ROOT) 550 MG CAPS Take 1 capsule by mouth daily.      Marland Kitchen levothyroxine (SYNTHROID) 112 MCG tablet Take 1 tablet (112 mcg total) by mouth daily before breakfast. (Patient taking differently: Take 112 mcg by mouth at bedtime. ) 90 tablet 1  . losartan-hydrochlorothiazide  (HYZAAR) 100-12.5 MG tablet Take 1 tablet by mouth daily. 30 tablet 0  . magnesium 30 MG tablet Take 30 mg by mouth 2 (two) times daily.    . metFORMIN (GLUCOPHAGE) 500 MG tablet Take 1 tablet (500 mg total) by mouth 2 (two) times daily with a meal. 180 tablet 1  . multivitamin (THERAGRAN) per tablet Take 1 tablet by mouth daily.      . Omega-3 Fatty Acids (FISH OIL) 1200 MG CAPS Take 1 capsule by mouth 2 (two) times daily.     Marland Kitchen OVER THE COUNTER MEDICATION Migra-eeze: Take 1 gel capsule two times daily.    . piroxicam (FELDENE) 10 MG capsule 1-2 daily prn 200 capsule 1  . propranolol (INDERAL) 80 MG tablet Take 1 tablet (80 mg total) by mouth 2 (two) times daily. 60 tablet 11  . tamsulosin (FLOMAX) 0.4 MG CAPS capsule Take 1 capsule (0.4 mg total) by mouth daily. Reported on 09/23/2015 30 capsule   . Turmeric 500 MG CAPS Take 1 capsule by mouth daily.     No current facility-administered medications for this visit.      Past Medical History:  Diagnosis Date  . Allergy   . Arthritis   . CAD (coronary artery disease)    s/p PTCI in 2003 and 2004 to the  LAD.   . Chronic tubotympanic suppurative otitis media, bilateral   . Depression   . Diabetes mellitus   . Eustachian tube dysfunction   . History of placement of ear tubes   . Hyperlipidemia   . Hypertension   . Hypothyroid   . Internal hemorrhoids   . Migraine headache   . Skin cancer, basal cell   . Thrombocytopenia ([place])   . Thrombocytopenia, unspecified ([place]) 05/08/2013  . TIA (transient ischemic attack) 2006   per patient's report.  He was never officially given this diagnosis    Past Surgical History:  Procedure Laterality Date  . COLONOSCOPY  2011  . coronary artery disease status post placement     of drug-eluting stent in the LAD in 2003,eEF 65% then  . fatty tissue removed     from neck 2003  . HERNIA REPAIR  2007  . POLYPECTOMY  2011   +TA  . right toe bone spur surgery    . THYROIDECTOMY, PARTIAL  07/2002    right thyroid  . TONSILLECTOMY      Social History   Socioeconomic History  . Marital status: Married    Spouse name: Not on file  . Number of children: 0  . Years of education: Not on file  . Highest education level: Not on file  Social Needs  . Financial resource strain: Not on file  . Food insecurity - worry: Not on file  . Food insecurity - inability: Not on file  . Transportation needs - medical: Not on file  . Transportation needs - non-medical: Not on file  Occupational History  . Occupation: retired    Fish farm manager: RETIRED    Comment: accountant  Tobacco Use  . Smoking status: Former Smoker    Years: 5.00    Types: Cigarettes    Last attempt to quit: 10/10/1965    Years since quitting: 52.1  . Smokeless tobacco: Never Used  Substance and Sexual Activity  . Alcohol use: No    Alcohol/week: 0.0 oz  . Drug use: No  . Sexual activity: Yes    Partners: Female  Other Topics Concern  . Not on file  Social History Narrative   Exercise-- treadmill, stationary bike    Family History  Problem Relation Age of Onset  . Mental illness Mother        alzheimers  . Heart disease Mother   . Heart disease Father        triple by pass  . Hypertension Brother        Prostate Cancer  . Prostate cancer Brother   . Hypertension Brother   . Hypertension Brother   . COPD Brother   . Colon cancer Neg Hx   . Colon polyps Neg Hx   . Rectal cancer Neg Hx   . Stomach cancer Neg Hx     ROS: Urinary frequency and back pain but no fevers or chills, productive cough, hemoptysis, dysphasia, odynophagia, melena, hematochezia, dysuria, hematuria, rash, seizure activity, orthopnea, PND, pedal edema, claudication. Remaining systems are negative.  Physical Exam: Well-developed well-nourished in no acute distress.  Skin is warm and dry.  HEENT is normal.  Neck is supple.  Chest is clear to auscultation with normal expansion.  Cardiovascular exam is regular rate and rhythm.  Abdominal  exam nontender or distended. No masses palpated. Extremities show no edema. neuro grossly intact  ECG-sinus rhythm at a rate of 64.  Personally reviewed  A/P  1 coronary artery disease-patient doing well  with no chest pain.  Continue medical therapy with aspirin and statin.  2 hypertension-blood pressure is controlled.  3 hyperlipidemia-statin.  Kirk Ruths, MD

## 2017-11-17 ENCOUNTER — Telehealth: Payer: Self-pay

## 2017-11-17 NOTE — Telephone Encounter (Signed)
-----   Message from [name], DO sent at 11/16/2017  1:26 PM EST ----- It is elevated--- needs urology f/u---- ok to put referral in if needed

## 2017-11-17 NOTE — Telephone Encounter (Signed)
LMOVM for pt to RTC to office to advise if he is ok with referral to Urology as Dr. Carollee Herter feels that it is needed after receiving lab results/thx dmf

## 2017-11-22 DIAGNOSIS — M4807 Spinal stenosis, lumbosacral region: Secondary | ICD-10-CM | POA: Diagnosis not present

## 2017-11-22 DIAGNOSIS — M5136 Other intervertebral disc degeneration, lumbar region: Secondary | ICD-10-CM | POA: Diagnosis not present

## 2017-11-22 DIAGNOSIS — M5126 Other intervertebral disc displacement, lumbar region: Secondary | ICD-10-CM | POA: Diagnosis not present

## 2017-11-22 DIAGNOSIS — M9953 Intervertebral disc stenosis of neural canal of lumbar region: Secondary | ICD-10-CM | POA: Diagnosis not present

## 2017-11-23 ENCOUNTER — Other Ambulatory Visit: Payer: Self-pay | Admitting: *Deleted

## 2017-11-23 MED ORDER — LOSARTAN POTASSIUM-HCTZ 100-12.5 MG PO TABS: 1.0000 | ORAL_TABLET | Freq: Every day | ORAL | 0 refills | Status: DC

## 2017-11-28 ENCOUNTER — Encounter: Payer: Self-pay | Admitting: Cardiology

## 2017-11-28 ENCOUNTER — Ambulatory Visit (INDEPENDENT_AMBULATORY_CARE_PROVIDER_SITE_OTHER): Payer: Medicare Other | Admitting: Cardiology

## 2017-11-28 VITALS — BP 126/78 | HR 64 | Ht 69.0 in | Wt 179.0 lb

## 2017-11-28 DIAGNOSIS — I10 Essential (primary) hypertension: Secondary | ICD-10-CM

## 2017-11-28 DIAGNOSIS — I2510 Atherosclerotic heart disease of native coronary artery without angina pectoris: Secondary | ICD-10-CM

## 2017-11-28 DIAGNOSIS — E785 Hyperlipidemia, unspecified: Secondary | ICD-10-CM

## 2017-11-28 MED ORDER — LOSARTAN POTASSIUM-HCTZ 100-12.5 MG PO TABS: 1.0000 | ORAL_TABLET | Freq: Every day | ORAL | 11 refills | Status: DC

## 2017-11-28 MED ORDER — ATORVASTATIN CALCIUM 40 MG PO TABS: ORAL_TABLET | ORAL | 11 refills | Status: DC

## 2017-11-28 MED ORDER — PROPRANOLOL HCL 80 MG PO TABS: 80.0000 mg | ORAL_TABLET | Freq: Two times a day (BID) | ORAL | 11 refills | Status: DC

## 2017-11-28 NOTE — Patient Instructions (Signed)
Your physician wants you to follow-up in: ONE YEAR WITH DR CRENSHAW You will receive a reminder letter in the mail two months in advance. If you don't receive a letter, please call our office to schedule the follow-up appointment.   If you need a refill on your cardiac medications before your next appointment, please call your pharmacy.  

## 2017-12-01 DIAGNOSIS — M4807 Spinal stenosis, lumbosacral region: Secondary | ICD-10-CM | POA: Diagnosis not present

## 2017-12-01 DIAGNOSIS — R3912 Poor urinary stream: Secondary | ICD-10-CM | POA: Diagnosis not present

## 2017-12-01 DIAGNOSIS — M9953 Intervertebral disc stenosis of neural canal of lumbar region: Secondary | ICD-10-CM | POA: Diagnosis not present

## 2017-12-01 DIAGNOSIS — M5126 Other intervertebral disc displacement, lumbar region: Secondary | ICD-10-CM | POA: Diagnosis not present

## 2017-12-01 DIAGNOSIS — M5136 Other intervertebral disc degeneration, lumbar region: Secondary | ICD-10-CM | POA: Diagnosis not present

## 2017-12-01 DIAGNOSIS — R972 Elevated prostate specific antigen [PSA]: Secondary | ICD-10-CM | POA: Diagnosis not present

## 2017-12-01 DIAGNOSIS — N401 Enlarged prostate with lower urinary tract symptoms: Secondary | ICD-10-CM | POA: Diagnosis not present

## 2017-12-05 DIAGNOSIS — L819 Disorder of pigmentation, unspecified: Secondary | ICD-10-CM | POA: Diagnosis not present

## 2017-12-05 DIAGNOSIS — D485 Neoplasm of uncertain behavior of skin: Secondary | ICD-10-CM | POA: Diagnosis not present

## 2017-12-05 DIAGNOSIS — D1801 Hemangioma of skin and subcutaneous tissue: Secondary | ICD-10-CM | POA: Diagnosis not present

## 2017-12-05 DIAGNOSIS — C44519 Basal cell carcinoma of skin of other part of trunk: Secondary | ICD-10-CM | POA: Diagnosis not present

## 2017-12-05 DIAGNOSIS — L570 Actinic keratosis: Secondary | ICD-10-CM | POA: Diagnosis not present

## 2017-12-06 ENCOUNTER — Other Ambulatory Visit: Payer: Self-pay | Admitting: Urology

## 2017-12-06 DIAGNOSIS — R972 Elevated prostate specific antigen [PSA]: Secondary | ICD-10-CM

## 2017-12-08 DIAGNOSIS — M4807 Spinal stenosis, lumbosacral region: Secondary | ICD-10-CM | POA: Diagnosis not present

## 2017-12-08 DIAGNOSIS — C44519 Basal cell carcinoma of skin of other part of trunk: Secondary | ICD-10-CM | POA: Diagnosis not present

## 2017-12-08 DIAGNOSIS — M9953 Intervertebral disc stenosis of neural canal of lumbar region: Secondary | ICD-10-CM | POA: Diagnosis not present

## 2017-12-08 DIAGNOSIS — M5136 Other intervertebral disc degeneration, lumbar region: Secondary | ICD-10-CM | POA: Diagnosis not present

## 2017-12-08 DIAGNOSIS — M5126 Other intervertebral disc displacement, lumbar region: Secondary | ICD-10-CM | POA: Diagnosis not present

## 2017-12-15 DIAGNOSIS — M5126 Other intervertebral disc displacement, lumbar region: Secondary | ICD-10-CM | POA: Diagnosis not present

## 2017-12-15 DIAGNOSIS — M5136 Other intervertebral disc degeneration, lumbar region: Secondary | ICD-10-CM | POA: Diagnosis not present

## 2017-12-15 DIAGNOSIS — M4807 Spinal stenosis, lumbosacral region: Secondary | ICD-10-CM | POA: Diagnosis not present

## 2017-12-15 DIAGNOSIS — M9953 Intervertebral disc stenosis of neural canal of lumbar region: Secondary | ICD-10-CM | POA: Diagnosis not present

## 2017-12-28 ENCOUNTER — Telehealth: Payer: Self-pay | Admitting: *Deleted

## 2017-12-28 NOTE — Telephone Encounter (Signed)
Received results from [facility]; forwarded to provider/SLS 03/21

## 2018-01-02 ENCOUNTER — Ambulatory Visit
Admission: RE | Admit: 2018-01-02 | Discharge: 2018-01-02 | Disposition: A | Discharge status: Home / Self Care | Payer: Medicare Other | Source: Ambulatory Visit | Attending: Urology | Admitting: Urology

## 2018-01-02 DIAGNOSIS — R972 Elevated prostate specific antigen [PSA]: Secondary | ICD-10-CM

## 2018-01-02 MED ORDER — GADOBENATE DIMEGLUMINE 529 MG/ML IV SOLN
16.0000 mL | Freq: Once | INTRAVENOUS | Status: AC | PRN
  Administered 2018-01-02: 16 mL via INTRAVENOUS

## 2018-01-16 DIAGNOSIS — C44519 Basal cell carcinoma of skin of other part of trunk: Secondary | ICD-10-CM | POA: Diagnosis not present

## 2018-01-16 DIAGNOSIS — C44612 Basal cell carcinoma of skin of right upper limb, including shoulder: Secondary | ICD-10-CM | POA: Diagnosis not present

## 2018-01-16 DIAGNOSIS — L570 Actinic keratosis: Secondary | ICD-10-CM | POA: Diagnosis not present

## 2018-01-16 DIAGNOSIS — D485 Neoplasm of uncertain behavior of skin: Secondary | ICD-10-CM | POA: Diagnosis not present

## 2018-01-23 DIAGNOSIS — H9113 Presbycusis, bilateral: Secondary | ICD-10-CM | POA: Diagnosis not present

## 2018-01-23 DIAGNOSIS — H6983 Other specified disorders of Eustachian tube, bilateral: Secondary | ICD-10-CM | POA: Diagnosis not present

## 2018-01-23 HISTORY — DX: Presbycusis, bilateral: H91.13

## 2018-01-25 ENCOUNTER — Encounter: Payer: Self-pay | Admitting: Neurology

## 2018-01-25 ENCOUNTER — Ambulatory Visit (INDEPENDENT_AMBULATORY_CARE_PROVIDER_SITE_OTHER): Payer: Medicare Other | Admitting: Neurology

## 2018-01-25 VITALS — BP 140/70 | HR 66 | Ht 69.0 in | Wt 180.4 lb

## 2018-01-25 DIAGNOSIS — G250 Essential tremor: Secondary | ICD-10-CM | POA: Diagnosis not present

## 2018-01-25 DIAGNOSIS — G43109 Migraine with aura, not intractable, without status migrainosus: Secondary | ICD-10-CM | POA: Diagnosis not present

## 2018-01-25 DIAGNOSIS — I2510 Atherosclerotic heart disease of native coronary artery without angina pectoris: Secondary | ICD-10-CM | POA: Diagnosis not present

## 2018-01-25 MED ORDER — PROPRANOLOL HCL ER 160 MG PO CP24: 160.0000 mg | ORAL_CAPSULE | Freq: Every day | ORAL | 5 refills | Status: DC

## 2018-01-25 NOTE — Patient Instructions (Addendum)
1.  I will switch the propranolol to extended release form, which is a coated capsule and taken just once a day (160mg  daily) 2.  Follow up in 6 months

## 2018-01-25 NOTE — Progress Notes (Signed)
NEUROLOGY FOLLOW UP OFFICE NOTE  [name] [id number]  HISTORY OF PRESENT ILLNESS: [name] is a 72 year old right-handed male with osteoarthritis, hypertension, type 2 diabetes and hyperlipidemia who follows up for migraine and essential tremor.  He is accompanied by his wife who supplements recent history.   UPDATE:  I  Migraine: Intensity:  Moderate to severe Duration:  Reduced intensity from severe to mild-moderate within an hour but lingers for a few hours Frequency:  He had a week of migraine in December (likely due to fasting for labs), 2 in January (likely due to eye strain on the computer) and 2 in March (likely triggered by bright light) Current NSAIDS:  piramxicam daily (for arthritis but seems to help headache) Current analgesics: No.  Instead, he treats acutely with ginger tea Current triptans:  no Current anti-emetic:  no Current muscle relaxants:  no Current anti-anxiolytic:  no Current sleep aide:  no Current Antihypertensive medications:  losartain-HCTZ, propranolol 80mg  twice daily Current Antidepressant medications:  no Current Anticonvulsant medications:  no Current Vitamins/Herbal/Supplements:  Ginger tea, Biotin, MVI, fish oil, coenzyme Q10, cinnamon, ginger root, turmeric 500mg , magnesium 250mg , riboflavin 400mg , butterbur 150mg  Current Antihistamines/Decongestants:  Flonase Other therapy:  Treats acutely with ginger tea and rest.   Caffeine:  2-3 cups coffee daily Alcohol:  no Smoker:  no Diet:  Hydrates.  No soda Exercise:  yes Depression/stress:  stable Sleep hygiene:  He wakes up every hour to urinate.  He is followed by urology.   II  Essential Tremor:  He is taking propranolol 80mg  twice daily.  No change since last visit.  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   HISTORY: Migraine: Onset:  40-50 years ago.  They would occur once or twice a year.  In September, they started to occur once or twice a week and then almost daily.  Over  the past couple of weeks (following tympanostomy tube placement), they have occurred about once a week. Location:  Top of head Quality:  explode Initial Intensity:  10/10 Aura:  Flashing lights/colors in vision Prodrome:  no Associated symptoms:  Nausea, photophobia, phonophobia.  Denies unilateral numbness and weakness. Initial Duration:  Several hours Initial Frequency:   In September, they started to occur once or twice a week and then almost daily.  Over the past couple of weeks (following tympanostomy tube placement), they have occurred about once a week. Triggers/exacerbating factors:  Stress, sound, NTG, light, wine, eye strain Relieving factors:  sleep Activity:  Needs to lay down   Past NSAIDS:  Celebrex Past analgesics:  no Past abortive triptans:  Sumatriptan (stopped due to CAD). Past muscle relaxants:  no Past anti-emetic:  no Past antihypertensive medications:  Propranolol (many years ago) Past antidepressant medications:  Prozac, amitriptyline Past anticonvulsant medications:  no Past vitamins/Herbal/Supplements:  no Other past therapies:  Biofeedback, relaxation therapy   MRI of brain without contrast from 08/11/16 was personally reviewed and revealed mild chronic small vessel ischemic changes but no acute stroke, bleed or mass lesion.  However, it did reveal mild mastoiditis.  He saw ENT and had tympanostomy tube placement on 09/22/16.     Essential Tremor: For over 10 years, he has had a tremor, particularly in his right hand.  It has become more noticeable.  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  There is no known family history.  He finds it difficult to swallow the propranolol and wonders if there  is a coated version.  PAST MEDICAL HISTORY: Past Medical History:  Diagnosis Date  . Allergy   . Arthritis   . CAD (coronary artery disease)    s/p PTCI in 2003 and 2004 to the LAD.   Marland Kitchen Chronic tubotympanic suppurative otitis media,  bilateral   . Depression   . Diabetes mellitus   . Eustachian tube dysfunction   . History of placement of ear tubes   . Hyperlipidemia   . Hypertension   . Hypothyroid   . Internal hemorrhoids   . Migraine headache   . Skin cancer, basal cell   . Thrombocytopenia ([place])   . Thrombocytopenia, unspecified ([place]) 05/08/2013  . TIA (transient ischemic attack) 2006   per patient's report.  He was never officially given this diagnosis    MEDICATIONS: Current Outpatient Medications on File Prior to Visit  Medication Sig Dispense Refill  . aspirin 81 MG tablet Take 81 mg by mouth daily.      Marland Kitchen atorvastatin (LIPITOR) 40 MG tablet TAKE 1 TABLET(40 MG) BY MOUTH DAILY 30 tablet 11  . Biotin 1000 MCG tablet Take 1,000 mcg by mouth daily.     . cetirizine (ZYRTEC ALLERGY) 10 MG tablet Take 1 tablet (10 mg total) by mouth daily. 90 tablet 1  . Cinnamon 500 MG capsule Take 1,000 mg by mouth 2 (two) times daily.     . Coenzyme Q10 (COQ-10) 100 MG capsule Take 100 mg by mouth 2 (two) times daily.     Marland Kitchen ezetimibe (ZETIA) 10 MG tablet Take 1 tablet (10 mg total) by mouth daily. 90 tablet 1  . FEVERFEW PO Take 1 capsule by mouth 3 (three) times daily.    . fluticasone (FLONASE) 50 MCG/ACT nasal spray Place 2 sprays into both nostrils daily. (Patient taking differently: Place 2 sprays into both nostrils daily as needed. ) 48 g 1  . Ginger, Zingiber officinalis, (GINGER ROOT) 550 MG CAPS Take 1 capsule by mouth daily.      Marland Kitchen levothyroxine (SYNTHROID) 112 MCG tablet Take 1 tablet (112 mcg total) by mouth daily before breakfast. (Patient taking differently: Take 112 mcg by mouth at bedtime. ) 90 tablet 1  . losartan-hydrochlorothiazide (HYZAAR) 100-12.5 MG tablet Take 1 tablet by mouth daily. 30 tablet 11  . magnesium 30 MG tablet Take 30 mg by mouth 2 (two) times daily.    . metFORMIN (GLUCOPHAGE) 500 MG tablet Take 1 tablet (500 mg total) by mouth 2 (two) times daily with a meal. 180 tablet 1  .  multivitamin (THERAGRAN) per tablet Take 1 tablet by mouth daily.      . Omega-3 Fatty Acids (FISH OIL) 1200 MG CAPS Take 1 capsule by mouth 2 (two) times daily.     Marland Kitchen OVER THE COUNTER MEDICATION Migra-eeze: Take 1 gel capsule two times daily.    . piroxicam (FELDENE) 10 MG capsule 1-2 daily prn 200 capsule 1  . tamsulosin (FLOMAX) 0.4 MG CAPS capsule Take 1 capsule (0.4 mg total) by mouth daily. Reported on 09/23/2015 30 capsule   . Turmeric 500 MG CAPS Take 1 capsule by mouth daily.     No current facility-administered medications on file prior to visit.     ALLERGIES: Allergies  Allergen Reactions  . Sulfa Antibiotics Other (See Comments)  . Sulfamethoxazole-Trimethoprim Nausea And Vomiting  . Codeine   . Crestor [Rosuvastatin Calcium]   . Hydrocodone-Acetaminophen     vicodin   . Other     Apples and tomatoes....Marland KitchenMarland Kitchen  Nausea and migraines.    . Rosuvastatin     REACTION: REALLY BAD JOINT/MUSCLE PAIN    FAMILY HISTORY: Family History  Problem Relation Age of Onset  . Mental illness Mother        alzheimers  . Heart disease Mother   . Heart disease Father        triple by pass  . Hypertension Brother        Prostate Cancer  . Prostate cancer Brother   . Hypertension Brother   . Hypertension Brother   . COPD Brother   . Colon cancer Neg Hx   . Colon polyps Neg Hx   . Rectal cancer Neg Hx   . Stomach cancer Neg Hx     SOCIAL HISTORY: Social History   Socioeconomic History  . Marital status: Married    Spouse name: Not on file  . Number of children: 0  . Years of education: Not on file  . Highest education level: Not on file  Occupational History  . Occupation: retired    Fish farm manager: RETIRED    Comment: accountant  Social Needs  . Financial resource strain: Not on file  . Food insecurity:    Worry: Not on file    Inability: Not on file  . Transportation needs:    Medical: Not on file    Non-medical: Not on file  Tobacco Use  . Smoking status: Former  Smoker    Years: 5.00    Types: Cigarettes    Last attempt to quit: 10/10/1965    Years since quitting: 52.3  . Smokeless tobacco: Never Used  Substance and Sexual Activity  . Alcohol use: No    Alcohol/week: 0.0 oz  . Drug use: No  . Sexual activity: Yes    Partners: Female  Lifestyle  . Physical activity:    Days per week: Not on file    Minutes per session: Not on file  . Stress: Not on file  Relationships  . Social connections:    Talks on phone: Not on file    Gets together: Not on file    Attends religious service: Not on file    Active member of club or organization: Not on file    Attends meetings of clubs or organizations: Not on file    Relationship status: Not on file  . Intimate partner violence:    Fear of current or ex partner: Not on file    Emotionally abused: Not on file    Physically abused: Not on file    Forced sexual activity: Not on file  Other Topics Concern  . Not on file  Social History Narrative   Exercise-- treadmill, stationary bike    REVIEW OF SYSTEMS: Constitutional: No fevers, chills, or sweats, no generalized fatigue, change in appetite Eyes: Reports a floater in vision of his right eye Ear, nose and throat: No hearing loss, ear pain, nasal congestion, sore throat Cardiovascular: No chest pain, palpitations Respiratory:  No shortness of breath at rest or with exertion, wheezes GastrointestinaI: No nausea, vomiting, diarrhea, abdominal pain, fecal incontinence Genitourinary:  frequency Musculoskeletal:  back pain Integumentary: No rash, pruritus, skin lesions Neurological: as above Psychiatric: No depression, insomnia, anxiety Endocrine: No palpitations, fatigue, diaphoresis, mood swings, change in appetite, change in weight, increased thirst Hematologic/Lymphatic:  No purpura, petechiae. Allergic/Immunologic: no itchy/runny eyes, nasal congestion, recent allergic reactions, rashes  PHYSICAL EXAM: Vitals:   01/25/18 0732  BP: 140/70   Pulse: 66  SpO2: 97%  General: No acute distress.  Patient appears well-groomed.  normal body habitus. Head:  Normocephalic/atraumatic Eyes:  Fundi examined but not visualized Neck: supple, no paraspinal tenderness, full range of motion Heart:  Regular rate and rhythm Lungs:  Clear to auscultation bilaterally Back: No paraspinal tenderness Neurological Exam: alert and oriented to person, place, and time. Attention span and concentration intact, recent and remote memory intact, fund of knowledge intact.  Speech fluent and not dysarthric, language intact.  CN II-XII intact. Bulk and tone normal, muscle strength 5/5 throughout.  Sensation to light touch  intact.  Deep tendon reflexes 2+ throughout.  Finger to nose testing with mild right intention tremor.  Gait normal, Romberg negative.   IMPRESSION: Migraine with aura, not intractable, without status migrainosus Essential tremor  PLAN: 1.  For better tolerability, we will switch propranolol 80mg  twice daily to ER 160mg  capsule once daily. 2.  Follow up with eye doctor regarding floater. 3.  Follow up in 6 months.  Metta Clines, DO  CC:  Roma Schanz, DO

## 2018-02-05 DIAGNOSIS — R972 Elevated prostate specific antigen [PSA]: Secondary | ICD-10-CM | POA: Diagnosis not present

## 2018-02-05 DIAGNOSIS — D075 Carcinoma in situ of prostate: Secondary | ICD-10-CM | POA: Diagnosis not present

## 2018-02-08 DIAGNOSIS — L570 Actinic keratosis: Secondary | ICD-10-CM | POA: Diagnosis not present

## 2018-02-08 DIAGNOSIS — Z85828 Personal history of other malignant neoplasm of skin: Secondary | ICD-10-CM | POA: Diagnosis not present

## 2018-02-08 DIAGNOSIS — L905 Scar conditions and fibrosis of skin: Secondary | ICD-10-CM | POA: Diagnosis not present

## 2018-02-08 DIAGNOSIS — C44612 Basal cell carcinoma of skin of right upper limb, including shoulder: Secondary | ICD-10-CM | POA: Diagnosis not present

## 2018-02-26 DIAGNOSIS — H90A32 Mixed conductive and sensorineural hearing loss, unilateral, left ear with restricted hearing on the contralateral side: Secondary | ICD-10-CM | POA: Diagnosis not present

## 2018-02-26 DIAGNOSIS — H918X2 Other specified hearing loss, left ear: Secondary | ICD-10-CM | POA: Diagnosis not present

## 2018-02-26 DIAGNOSIS — H90A21 Sensorineural hearing loss, unilateral, right ear, with restricted hearing on the contralateral side: Secondary | ICD-10-CM | POA: Diagnosis not present

## 2018-03-20 DIAGNOSIS — H7292 Unspecified perforation of tympanic membrane, left ear: Secondary | ICD-10-CM | POA: Diagnosis not present

## 2018-03-20 DIAGNOSIS — H6983 Other specified disorders of Eustachian tube, bilateral: Secondary | ICD-10-CM | POA: Diagnosis not present

## 2018-03-20 DIAGNOSIS — H918X2 Other specified hearing loss, left ear: Secondary | ICD-10-CM | POA: Diagnosis not present

## 2018-03-28 ENCOUNTER — Telehealth: Payer: Self-pay | Admitting: Family Medicine

## 2018-03-28 NOTE — Telephone Encounter (Signed)
Called pt and LVM about his appt on 04/19/18. Informed pt that Dr. Etter Sjogren has meetings that morning and that we would need to reschedule for later in the day, or for another day. Advised to call back and reschedule.

## 2018-04-10 ENCOUNTER — Ambulatory Visit: Payer: Medicare Other | Admitting: Family Medicine

## 2018-04-19 ENCOUNTER — Ambulatory Visit (INDEPENDENT_AMBULATORY_CARE_PROVIDER_SITE_OTHER): Payer: Medicare Other | Admitting: Family Medicine

## 2018-04-19 ENCOUNTER — Encounter: Payer: Self-pay | Admitting: Family Medicine

## 2018-04-19 ENCOUNTER — Ambulatory Visit: Payer: Medicare Other | Admitting: Family Medicine

## 2018-04-19 VITALS — BP 125/60 | HR 58 | Temp 98.0°F | Resp 16 | Ht 69.0 in | Wt 174.6 lb

## 2018-04-19 DIAGNOSIS — H9193 Unspecified hearing loss, bilateral: Secondary | ICD-10-CM

## 2018-04-19 DIAGNOSIS — I2510 Atherosclerotic heart disease of native coronary artery without angina pectoris: Secondary | ICD-10-CM

## 2018-04-19 DIAGNOSIS — IMO0002 Reserved for concepts with insufficient information to code with codable children: Secondary | ICD-10-CM

## 2018-04-19 DIAGNOSIS — M159 Polyosteoarthritis, unspecified: Secondary | ICD-10-CM

## 2018-04-19 DIAGNOSIS — E039 Hypothyroidism, unspecified: Secondary | ICD-10-CM | POA: Diagnosis not present

## 2018-04-19 DIAGNOSIS — I10 Essential (primary) hypertension: Secondary | ICD-10-CM

## 2018-04-19 DIAGNOSIS — E785 Hyperlipidemia, unspecified: Secondary | ICD-10-CM | POA: Diagnosis not present

## 2018-04-19 DIAGNOSIS — E1151 Type 2 diabetes mellitus with diabetic peripheral angiopathy without gangrene: Secondary | ICD-10-CM | POA: Diagnosis not present

## 2018-04-19 DIAGNOSIS — E1165 Type 2 diabetes mellitus with hyperglycemia: Secondary | ICD-10-CM

## 2018-04-19 DIAGNOSIS — H6520 Chronic serous otitis media, unspecified ear: Secondary | ICD-10-CM

## 2018-04-19 HISTORY — DX: Chronic serous otitis media, unspecified ear: H65.20

## 2018-04-19 LAB — COMPREHENSIVE METABOLIC PANEL
ALBUMIN: 3.9 g/dL (ref 3.5–5.2)
ALK PHOS: 77 U/L (ref 39–117)
ALT: 15 U/L (ref 0–53)
AST: 18 U/L (ref 0–37)
BUN: 24 mg/dL — ABNORMAL HIGH (ref 6–23)
CO2: 31 mEq/L (ref 19–32)
Calcium: 9.1 mg/dL (ref 8.4–10.5)
Chloride: 104 mEq/L (ref 96–112)
Creatinine, Ser: 1.31 mg/dL (ref 0.40–1.50)
GFR: 56.81 mL/min — AB (ref >60.00)
GLUCOSE: 107 mg/dL — AB (ref 70–99)
POTASSIUM: 4.1 meq/L (ref 3.5–5.1)
Sodium: 142 mEq/L (ref 135–145)
TOTAL PROTEIN: 6.3 g/dL (ref 6.0–8.3)
Total Bilirubin: 0.8 mg/dL (ref 0.2–1.2)

## 2018-04-19 LAB — LIPID PANEL
CHOLESTEROL: 91 mg/dL (ref 0–200)
HDL: 42.8 mg/dL (ref >39.00)
LDL CALC: 36 mg/dL (ref 0–99)
NonHDL: 48.22
Total CHOL/HDL Ratio: 2
Triglycerides: 59 mg/dL (ref 0.0–149.0)
VLDL: 11.8 mg/dL (ref 0.0–40.0)

## 2018-04-19 LAB — HEMOGLOBIN A1C: HEMOGLOBIN A1C: 5.8 % (ref 4.6–6.5)

## 2018-04-19 MED ORDER — PIROXICAM 10 MG PO CAPS: ORAL_CAPSULE | ORAL | 1 refills | Status: DC

## 2018-04-19 MED ORDER — METFORMIN HCL 500 MG PO TABS: 500.0000 mg | ORAL_TABLET | Freq: Two times a day (BID) | ORAL | 5 refills | Status: DC

## 2018-04-19 MED ORDER — LEVOTHYROXINE SODIUM 112 MCG PO TABS: 112.0000 ug | ORAL_TABLET | Freq: Every day | ORAL | 1 refills | Status: DC

## 2018-04-19 NOTE — Assessment & Plan Note (Signed)
Check labs con't meds 

## 2018-04-19 NOTE — Assessment & Plan Note (Signed)
Tolerating statin, encouraged heart healthy diet, avoid trans fats, minimize simple carbs and saturated fats. Increase exercise as tolerated 

## 2018-04-19 NOTE — Progress Notes (Signed)
Patient ID: [name], male   DOB: [birth year], 72 y.o.   MRN: [id number]     Subjective:  I acted as a Education administrator for Dr. Carollee Herter.  [name], [name]   Patient ID: [name], male    DOB: 1945/11/11, 72 y.o.   MRN: [id number]  Chief Complaint  Patient presents with  . Hyperlipidemia  . Hypertension  . Diabetes    HPI  Patient is in today for follow up diabetes, blood pressure, and cholesterol  He is also c/o drainage for both ears and he is afraid because his hearing aiids are in contact with the fluid----  No one seems to be able to stop the drainage.  He is requesting a new ENT HYPERTENSION   Blood pressure range-not checking   Chest pain- no      Dyspnea- no Lightheadedness- no   Edema- no  Other side effects - no   Medication compliance: god Low salt diet- yes    DIABETES    Blood Sugar ranges-not checking   Polyuria- no New Visual problems- no  Hypoglycemic symptoms- no  Other side effects-no Medication compliance - good Last eye exam- 09/2017 Foot exam- today   HYPERLIPIDEMIA  Medication compliance- good RUQ pain- no  Muscle aches- no Other side effects-no   Patient Care Team: [name], DO as PCP - General Anda Kraft, MD as Consulting Physician (Endocrinology) Druscilla Brownie, MD as Consulting Physician (Dermatology) Lelon Perla, MD as Consulting Physician (Cardiology) Raynelle Bring, MD as Consulting Physician (Urology) Melissa Montane, MD as Consulting Physician (Otolaryngology) Ladene Artist, MD as Consulting Physician (Gastroenterology) Luberta Mutter, MD as Consulting Physician (Ophthalmology)   Past Medical History:  Diagnosis Date  . Allergy   . Arthritis   . CAD (coronary artery disease)    s/p PTCI in 2003 and 2004 to the LAD.   Marland Kitchen Chronic tubotympanic suppurative otitis media, bilateral   . Depression   . Diabetes mellitus   . Eustachian tube dysfunction   . History of placement of ear tubes   . Hyperlipidemia    . Hypertension   . Hypothyroid   . Internal hemorrhoids   . Migraine headache   . Skin cancer, basal cell   . Thrombocytopenia ([place])   . Thrombocytopenia, unspecified ([place]) 05/08/2013  . TIA (transient ischemic attack) 2006   per patient's report.  He was never officially given this diagnosis    Past Surgical History:  Procedure Laterality Date  . COLONOSCOPY  2011  . coronary artery disease status post placement     of drug-eluting stent in the LAD in 2003,eEF 65% then  . fatty tissue removed     from neck 2003  . HERNIA REPAIR  2007  . POLYPECTOMY  2011   +TA  . right toe bone spur surgery    . THYROIDECTOMY, PARTIAL  07/2002   right thyroid  . TONSILLECTOMY      Family History  Problem Relation Age of Onset  . Mental illness Mother        alzheimers  . Heart disease Mother   . Heart disease Father        triple by pass  . Hypertension Brother        Prostate Cancer  . Prostate cancer Brother   . Hypertension Brother   . Hypertension Brother   . COPD Brother   . Colon cancer Neg Hx   . Colon polyps Neg Hx   . Rectal cancer Neg  Hx   . Stomach cancer Neg Hx     Social History   Socioeconomic History  . Marital status: Married    Spouse name: Not on file  . Number of children: 0  . Years of education: Not on file  . Highest education level: Not on file  Occupational History  . Occupation: retired    Fish farm manager: RETIRED    Comment: accountant  Social Needs  . Financial resource strain: Not on file  . Food insecurity:    Worry: Not on file    Inability: Not on file  . Transportation needs:    Medical: Not on file    Non-medical: Not on file  Tobacco Use  . Smoking status: Former Smoker    Years: 5.00    Types: Cigarettes    Last attempt to quit: 10/10/1965    Years since quitting: 52.5  . Smokeless tobacco: Never Used  Substance and Sexual Activity  . Alcohol use: No    Alcohol/week: 0.0 oz  . Drug use: No  . Sexual activity: Yes    Partners:  Female  Lifestyle  . Physical activity:    Days per week: Not on file    Minutes per session: Not on file  . Stress: Not on file  Relationships  . Social connections:    Talks on phone: Not on file    Gets together: Not on file    Attends religious service: Not on file    Active member of club or organization: Not on file    Attends meetings of clubs or organizations: Not on file    Relationship status: Not on file  . Intimate partner violence:    Fear of current or ex partner: Not on file    Emotionally abused: Not on file    Physically abused: Not on file    Forced sexual activity: Not on file  Other Topics Concern  . Not on file  Social History Narrative   Exercise-- treadmill, stationary bike    Outpatient Medications Prior to Visit  Medication Sig Dispense Refill  . aspirin 81 MG tablet Take 81 mg by mouth daily.      Marland Kitchen atorvastatin (LIPITOR) 40 MG tablet TAKE 1 TABLET(40 MG) BY MOUTH DAILY 30 tablet 11  . ezetimibe (ZETIA) 10 MG tablet Take 1 tablet (10 mg total) by mouth daily. 90 tablet 1  . fluticasone (FLONASE) 50 MCG/ACT nasal spray Place 2 sprays into both nostrils daily. (Patient taking differently: Place 2 sprays into both nostrils daily as needed. ) 48 g 1  . losartan-hydrochlorothiazide (HYZAAR) 100-12.5 MG tablet Take 1 tablet by mouth daily. 30 tablet 11  . OVER THE COUNTER MEDICATION Migra-eeze: Take 1 gel capsule two times daily.    . propranolol ER (INDERAL LA) 160 MG SR capsule Take 1 capsule (160 mg total) by mouth daily. 30 capsule 5  . tamsulosin (FLOMAX) 0.4 MG CAPS capsule Take 1 capsule (0.4 mg total) by mouth daily. Reported on 09/23/2015 30 capsule   . levothyroxine (SYNTHROID) 112 MCG tablet Take 1 tablet (112 mcg total) by mouth daily before breakfast. (Patient taking differently: Take 112 mcg by mouth at bedtime. ) 90 tablet 1  . metFORMIN (GLUCOPHAGE) 500 MG tablet Take 1 tablet (500 mg total) by mouth 2 (two) times daily with a meal. 180 tablet 1   . piroxicam (FELDENE) 10 MG capsule 1-2 daily prn 200 capsule 1  . Biotin 1000 MCG tablet Take 1,000 mcg by mouth daily.     Marland Kitchen  cetirizine (ZYRTEC ALLERGY) 10 MG tablet Take 1 tablet (10 mg total) by mouth daily. (Patient not taking: Reported on 04/19/2018) 90 tablet 1  . Cinnamon 500 MG capsule Take 1,000 mg by mouth 2 (two) times daily.     . Coenzyme Q10 (COQ-10) 100 MG capsule Take 100 mg by mouth 2 (two) times daily.     Marland Kitchen FEVERFEW PO Take 1 capsule by mouth 3 (three) times daily.    . Ginger, Zingiber officinalis, (GINGER ROOT) 550 MG CAPS Take 1 capsule by mouth daily.      . magnesium 30 MG tablet Take 30 mg by mouth 2 (two) times daily.    . multivitamin (THERAGRAN) per tablet Take 1 tablet by mouth daily.      . Omega-3 Fatty Acids (FISH OIL) 1200 MG CAPS Take 1 capsule by mouth 2 (two) times daily.     . Turmeric 500 MG CAPS Take 1 capsule by mouth daily.     No facility-administered medications prior to visit.     Allergies  Allergen Reactions  . Sulfa Antibiotics Other (See Comments)  . Sulfamethoxazole-Trimethoprim Nausea And Vomiting  . Codeine   . Crestor [Rosuvastatin Calcium]   . Hydrocodone-Acetaminophen     vicodin   . Other     Apples and tomatoes...... Nausea and migraines.    . Rosuvastatin     REACTION: REALLY BAD JOINT/MUSCLE PAIN    Review of Systems  Constitutional: Negative for fever and malaise/fatigue.  HENT: Negative for congestion.   Eyes: Negative for blurred vision.  Respiratory: Negative for cough and shortness of breath.   Cardiovascular: Negative for chest pain, palpitations and leg swelling.  Gastrointestinal: Negative for vomiting.  Musculoskeletal: Negative for back pain.  Skin: Negative for rash.  Neurological: Negative for loss of consciousness and headaches.       Objective:    Physical Exam  Constitutional: He is oriented to person, place, and time. Vital signs are normal. He appears well-developed and well-nourished. He is  sleeping. No distress.  HENT:  Head: Normocephalic and atraumatic.  Mouth/Throat: Oropharynx is clear and moist.  Eyes: Pupils are equal, round, and reactive to light. EOM are normal.  Neck: Normal range of motion. Neck supple. No thyromegaly present.  Cardiovascular: Normal rate, regular rhythm and normal heart sounds.  No murmur heard. Pulmonary/Chest: Effort normal and breath sounds normal. No respiratory distress. He has no wheezes. He has no rales. He exhibits no tenderness.  Musculoskeletal: He exhibits no edema or tenderness.  Neurological: He is alert and oriented to person, place, and time.  Skin: Skin is warm and dry.  Psychiatric: He has a normal mood and affect. His behavior is normal. Judgment and thought content normal.  Nursing note and vitals reviewed.  Diabetic Foot Exam - Simple   No data filed       BP 125/60   Pulse (!) 58   Temp 98 F (36.7 C) (Oral)   Resp 16   Ht '5\' 9"'  (1.753 m)   Wt 174 lb 9.6 oz (79.2 kg)   SpO2 97%   BMI 25.78 kg/m  Wt Readings from Last 3 Encounters:  04/19/18 174 lb 9.6 oz (79.2 kg)  01/25/18 180 lb 6.4 oz (81.8 kg)  11/28/17 179 lb (81.2 kg)   BP Readings from Last 3 Encounters:  04/19/18 125/60  01/25/18 140/70  11/28/17 126/78     Immunization History  Administered Date(s) Administered  . Influenza Split 07/26/2011  . Influenza Whole 08/30/2007,  07/08/2009, 07/06/2010  . Influenza, High Dose Seasonal PF 07/11/2014, 10/06/2016, 07/23/2017  . Influenza,inj,Quad PF,6+ Mos 07/05/2013  . Influenza-Unspecified 08/19/2015  . Pneumococcal Conjugate-13 12/17/2013  . Pneumococcal Polysaccharide-23 04/23/2003, 07/08/2009  . Td 02/19/2003  . Tdap 09/19/2013  . Zoster 02/21/2007  . Zoster Recombinat (Shingrix) 07/25/2017, 09/26/2017    Health Maintenance  Topic Date Due  . HEMOGLOBIN A1C  04/08/2018  . INFLUENZA VACCINE  05/10/2018  . OPHTHALMOLOGY EXAM  09/29/2018  . FOOT EXAM  10/09/2018  . COLONOSCOPY  08/16/2020  .  TETANUS/TDAP  09/20/2023  . Hepatitis C Screening  Completed  . PNA vac Low Risk Adult  Completed    Lab Results  Component Value Date   WBC 6.6 05/17/2017   HGB 14.5 05/17/2017   HCT 39.5 05/17/2017   PLT 107 (L) 05/17/2017   GLUCOSE 107 (H) 04/19/2018   CHOL 91 04/19/2018   TRIG 59.0 04/19/2018   HDL 42.80 04/19/2018   LDLCALC 36 04/19/2018   ALT 15 04/19/2018   AST 18 04/19/2018   NA 142 04/19/2018   K 4.1 04/19/2018   CL 104 04/19/2018   CREATININE 1.31 04/19/2018   BUN 24 (H) 04/19/2018   CO2 31 04/19/2018   TSH 1.31 10/09/2017   PSA 7.35 (H) 11/14/2017   INR 1.0 RATIO 12/11/2008   HGBA1C 5.8 04/19/2018   MICROALBUR 2.5 (H) 09/24/2015    Lab Results  Component Value Date   TSH 1.31 10/09/2017   Lab Results  Component Value Date   WBC 6.6 05/17/2017   HGB 14.5 05/17/2017   HCT 39.5 05/17/2017   MCV 88.2 05/17/2017   PLT 107 (L) 05/17/2017   Lab Results  Component Value Date   NA 142 04/19/2018   K 4.1 04/19/2018   CHLORIDE 107 01/05/2017   CO2 31 04/19/2018   GLUCOSE 107 (H) 04/19/2018   BUN 24 (H) 04/19/2018   CREATININE 1.31 04/19/2018   BILITOT 0.8 04/19/2018   ALKPHOS 77 04/19/2018   AST 18 04/19/2018   ALT 15 04/19/2018   PROT 6.3 04/19/2018   ALBUMIN 3.9 04/19/2018   CALCIUM 9.1 04/19/2018   ANIONGAP 8 05/17/2017   EGFR 55 (L) 01/05/2017   GFR 56.81 (L) 04/19/2018   Lab Results  Component Value Date   CHOL 91 04/19/2018   Lab Results  Component Value Date   HDL 42.80 04/19/2018   Lab Results  Component Value Date   LDLCALC 36 04/19/2018   Lab Results  Component Value Date   TRIG 59.0 04/19/2018   Lab Results  Component Value Date   CHOLHDL 2 04/19/2018   Lab Results  Component Value Date   HGBA1C 5.8 04/19/2018         Assessment & Plan:   Problem List Items Addressed This Visit      Unprioritized   Chronic serous otitis media    Pt would like to go to a different ent in [place] Referral placed      Relevant  Orders   Ambulatory referral to ENT   Coronary artery disease involving native coronary artery without angina pectoris    Check labs Per cardiology      DM (diabetes mellitus) type II uncontrolled, periph vascular disorder ([place]) - Primary    hgba1c to be checked, minimize simple carbs. Increase exercise as tolerated. Continue current meds      Relevant Medications   metFORMIN (GLUCOPHAGE) 500 MG tablet   Other Relevant Orders   Hemoglobin A1c (Completed)  Essential hypertension    Well controlled, no changes to meds. Encouraged heart healthy diet such as the DASH diet and exercise as tolerated.       Relevant Orders   Comprehensive metabolic panel (Completed)   Hyperlipidemia LDL goal <70    Tolerating statin, encouraged heart healthy diet, avoid trans fats, minimize simple carbs and saturated fats. Increase exercise as tolerated      Relevant Orders   Lipid panel (Completed)   Hypothyroidism    Check labs con't meds      Relevant Medications   levothyroxine (SYNTHROID) 112 MCG tablet    Other Visit Diagnoses    Osteoarthritis of multiple joints, unspecified osteoarthritis type       Relevant Medications   piroxicam (FELDENE) 10 MG capsule   Bilateral hearing loss, unspecified hearing loss type       Relevant Orders   Ambulatory referral to ENT      I have changed [facility] John A. Struss's levothyroxine. I am also having him maintain his aspirin, Biotin, multivitamin, Fish Oil, CoQ-10, Cinnamon, Ginger Root, Turmeric, magnesium, tamsulosin, FEVERFEW PO, OVER THE COUNTER MEDICATION, cetirizine, fluticasone, ezetimibe, atorvastatin, losartan-hydrochlorothiazide, propranolol ER, metFORMIN, and piroxicam.  Meds ordered this encounter  Medications  . metFORMIN (GLUCOPHAGE) 500 MG tablet    Sig: Take 1 tablet (500 mg total) by mouth 2 (two) times daily with a meal.    Dispense:  60 tablet    Refill:  5    Please delete rx for Glumetza.  Marland Kitchen levothyroxine (SYNTHROID) 112 MCG  tablet    Sig: Take 1 tablet (112 mcg total) by mouth at bedtime.    Dispense:  90 tablet    Refill:  1  . piroxicam (FELDENE) 10 MG capsule    Sig: 1-2 daily prn    Dispense:  200 capsule    Refill:  1    CMA served as scribe during this visit. History, Physical and Plan performed by medical provider. Documentation and orders reviewed and attested to.  [name], DO

## 2018-04-19 NOTE — Assessment & Plan Note (Signed)
Well controlled, no changes to meds. Encouraged heart healthy diet such as the DASH diet and exercise as tolerated.  °

## 2018-04-19 NOTE — Assessment & Plan Note (Signed)
Pt would like to go to a different ent in [place] Referral placed

## 2018-04-19 NOTE — Assessment & Plan Note (Signed)
Check labs  Per cardiology 

## 2018-04-19 NOTE — Patient Instructions (Signed)

## 2018-04-19 NOTE — Assessment & Plan Note (Signed)
hgba1c to be checked, minimize simple carbs. Increase exercise as tolerated. Continue current meds  

## 2018-04-22 ENCOUNTER — Encounter: Payer: Self-pay | Admitting: Family Medicine

## 2018-04-22 ENCOUNTER — Other Ambulatory Visit: Payer: Self-pay | Admitting: Family Medicine

## 2018-04-22 DIAGNOSIS — E785 Hyperlipidemia, unspecified: Secondary | ICD-10-CM

## 2018-04-23 MED ORDER — EZETIMIBE 10 MG PO TABS: 10.0000 mg | ORAL_TABLET | Freq: Every day | ORAL | 5 refills | Status: DC

## 2018-04-24 DIAGNOSIS — L570 Actinic keratosis: Secondary | ICD-10-CM | POA: Diagnosis not present

## 2018-04-24 DIAGNOSIS — Z85828 Personal history of other malignant neoplasm of skin: Secondary | ICD-10-CM | POA: Diagnosis not present

## 2018-04-24 DIAGNOSIS — L2081 Atopic neurodermatitis: Secondary | ICD-10-CM | POA: Diagnosis not present

## 2018-05-02 DIAGNOSIS — H66001 Acute suppurative otitis media without spontaneous rupture of ear drum, right ear: Secondary | ICD-10-CM | POA: Diagnosis not present

## 2018-05-02 DIAGNOSIS — Z87891 Personal history of nicotine dependence: Secondary | ICD-10-CM | POA: Diagnosis not present

## 2018-05-02 DIAGNOSIS — H7202 Central perforation of tympanic membrane, left ear: Secondary | ICD-10-CM | POA: Diagnosis not present

## 2018-05-02 DIAGNOSIS — Z9622 Myringotomy tube(s) status: Secondary | ICD-10-CM | POA: Diagnosis not present

## 2018-05-02 DIAGNOSIS — H6690 Otitis media, unspecified, unspecified ear: Secondary | ICD-10-CM | POA: Insufficient documentation

## 2018-05-14 DIAGNOSIS — L249 Irritant contact dermatitis, unspecified cause: Secondary | ICD-10-CM | POA: Diagnosis not present

## 2018-05-23 DIAGNOSIS — H918X2 Other specified hearing loss, left ear: Secondary | ICD-10-CM | POA: Diagnosis not present

## 2018-05-23 DIAGNOSIS — H6690 Otitis media, unspecified, unspecified ear: Secondary | ICD-10-CM | POA: Diagnosis not present

## 2018-05-23 DIAGNOSIS — H90A32 Mixed conductive and sensorineural hearing loss, unilateral, left ear with restricted hearing on the contralateral side: Secondary | ICD-10-CM | POA: Diagnosis not present

## 2018-05-23 DIAGNOSIS — H90A21 Sensorineural hearing loss, unilateral, right ear, with restricted hearing on the contralateral side: Secondary | ICD-10-CM | POA: Diagnosis not present

## 2018-07-11 IMAGING — CT CT MAXILLOFACIAL W/O CM
3 series · 15 of 47 positions shown, 18 images · non-contrast
Comparison: Brain MRI 08/11/2016

CLINICAL DATA: History of migraine. Fluid in mastoids on recent
MRI. History of recent antibiotics.

EXAM:
CT MAXILLOFACIAL WITHOUT CONTRAST
TECHNIQUE: Multidetector CT imaging of the maxillofacial structures was
performed. Multiplanar CT image reconstructions were also generated.
A small metallic BB was placed on the right temple in order to
reliably differentiate right from left.

[Series 2: max soft · axial · 0.39mm/px · z∈[-186,-36]mm · 9 of 88 slices shown, 12 images]
[im 7/88  brain]
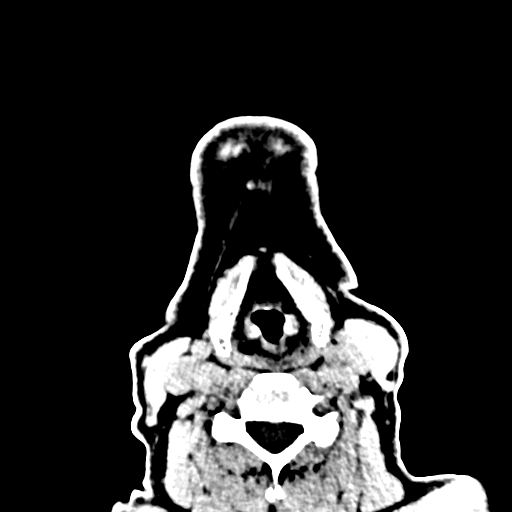
[im 7/88  bone]
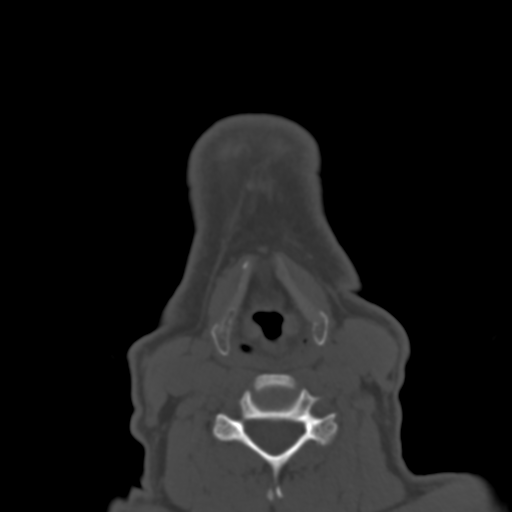
[im 16/88  bone]
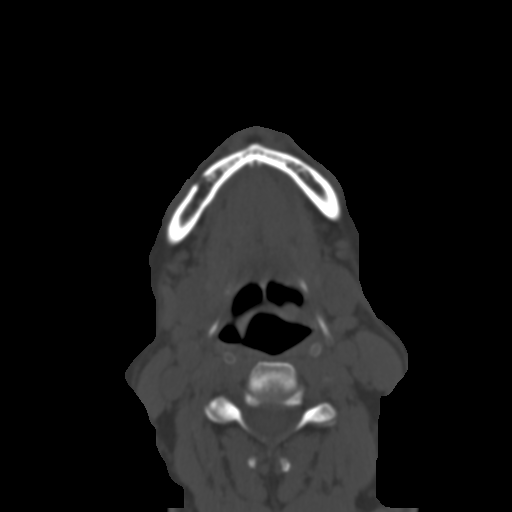
[im 25/88  bone]
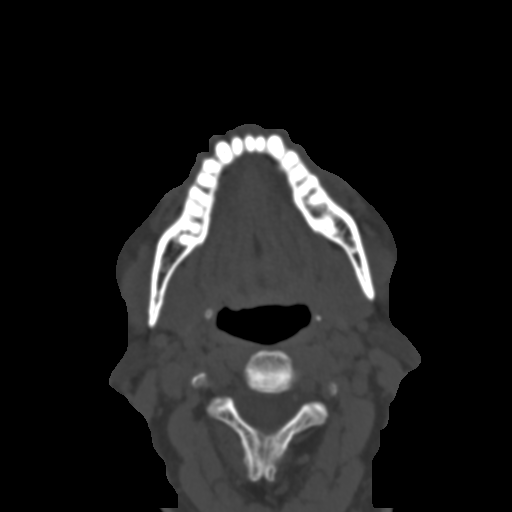
[im 34/88  bone]
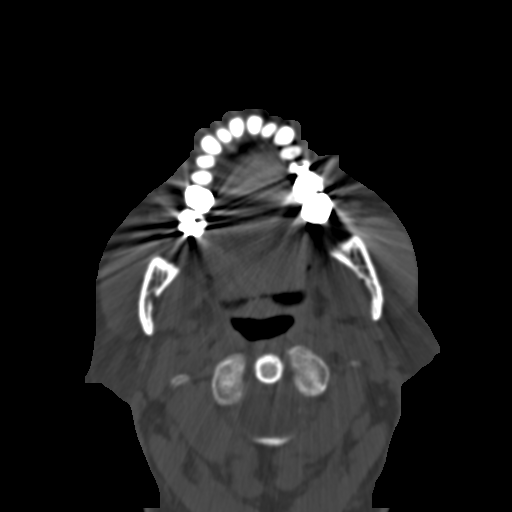
[im 46/88  brain]
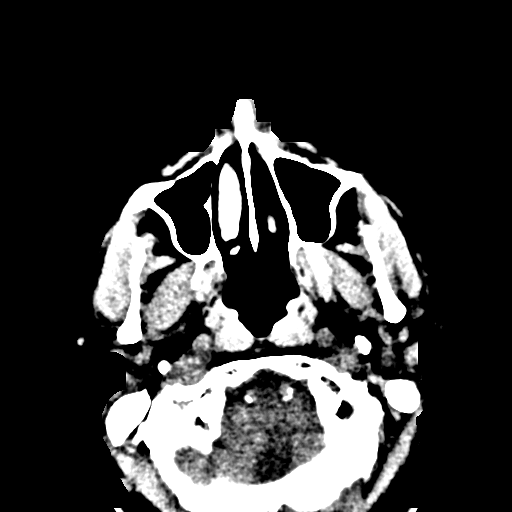
[im 46/88  bone]
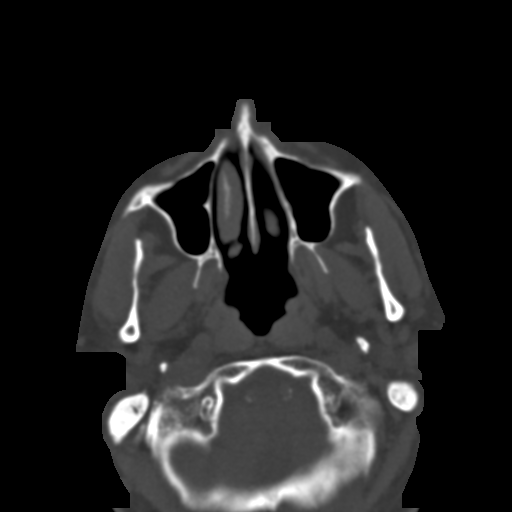
[im 55/88  bone]
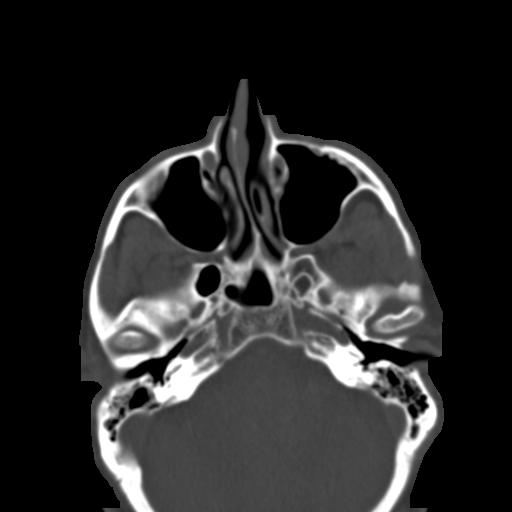
[im 64/88  bone]
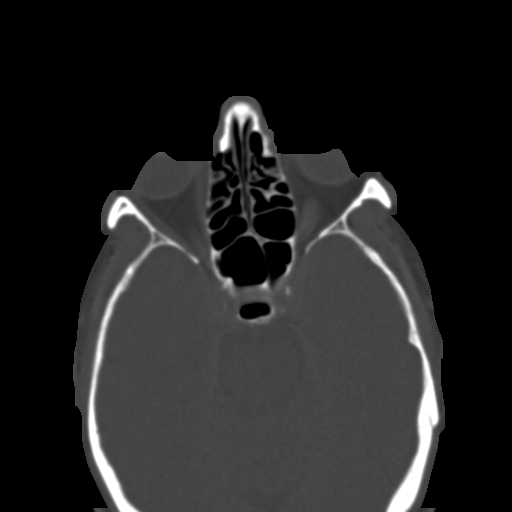
[im 73/88  bone]
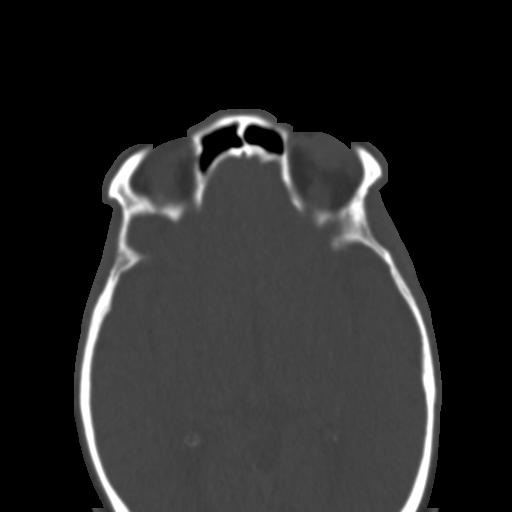
[im 82/88  brain]
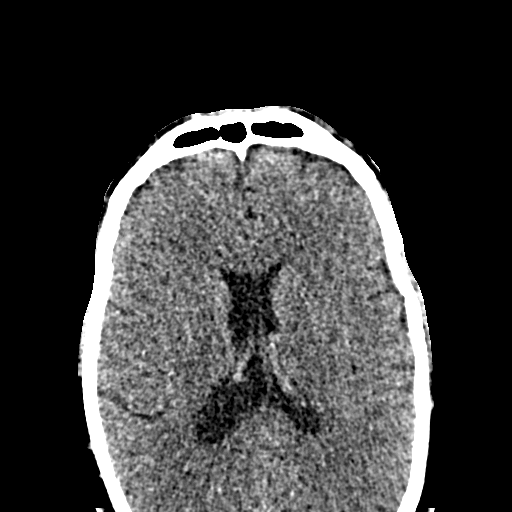
[im 82/88  bone]
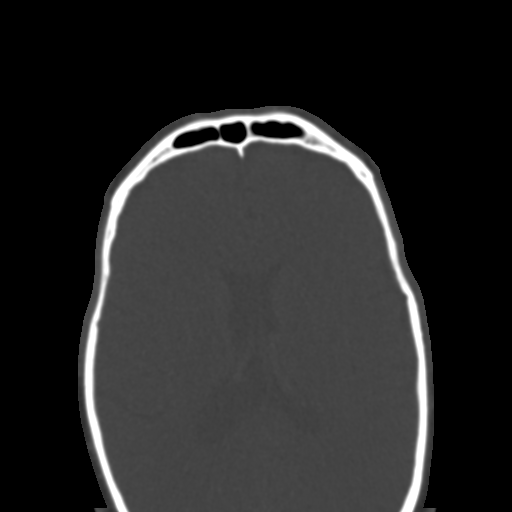

[Series 6: coronal soft · coronal · 0.40mm/px · 3 of 74 slices shown]
[im 25/74  bone]
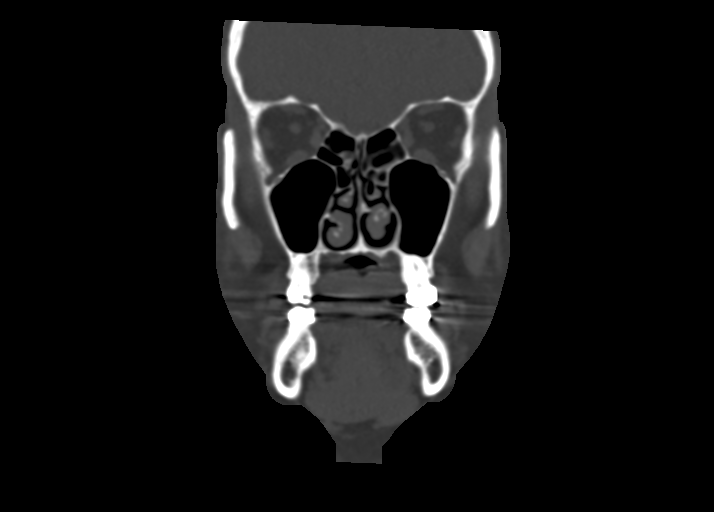
[im 33/74  bone]
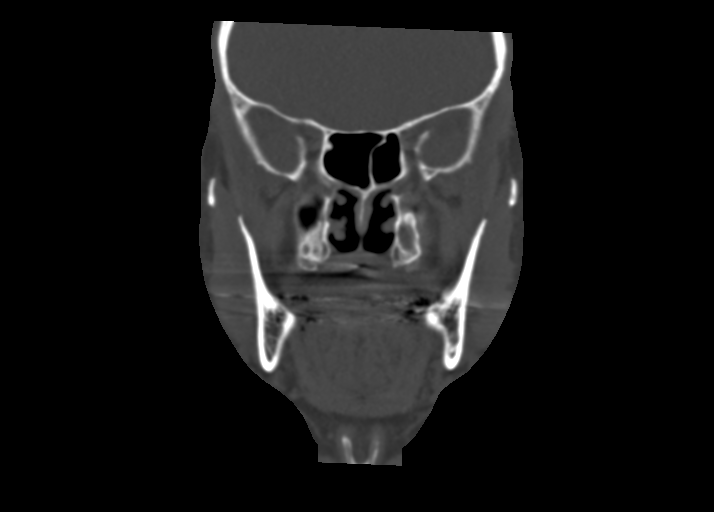
[im 41/74  bone]
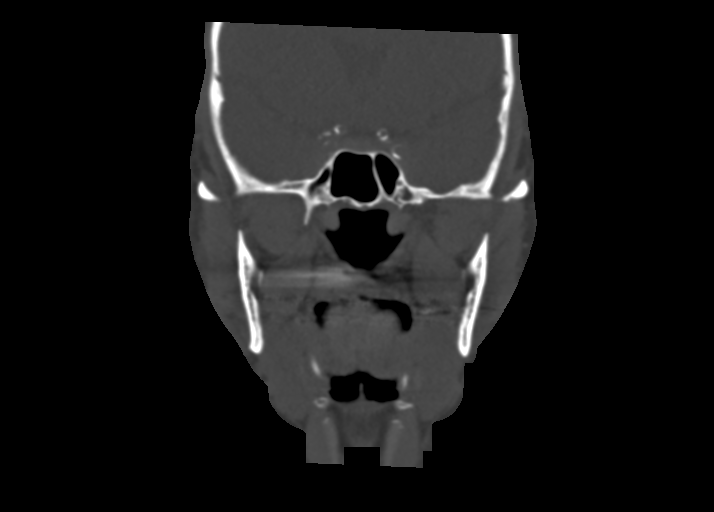

[Series 8: sagittal soft · sagittal · 0.36mm/px · 3 of 84 slices shown]
[im 28/84  bone]
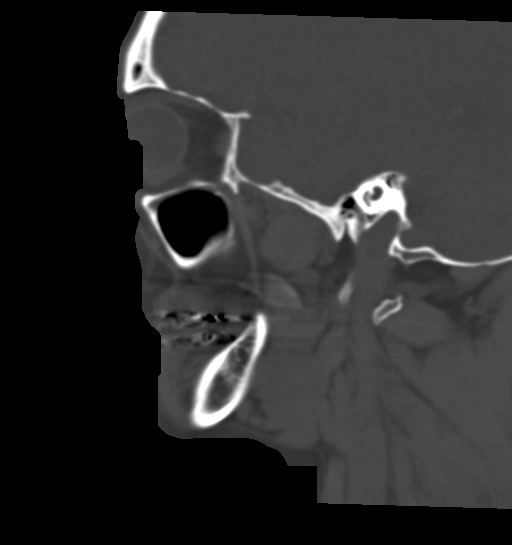
[im 42/84  bone]
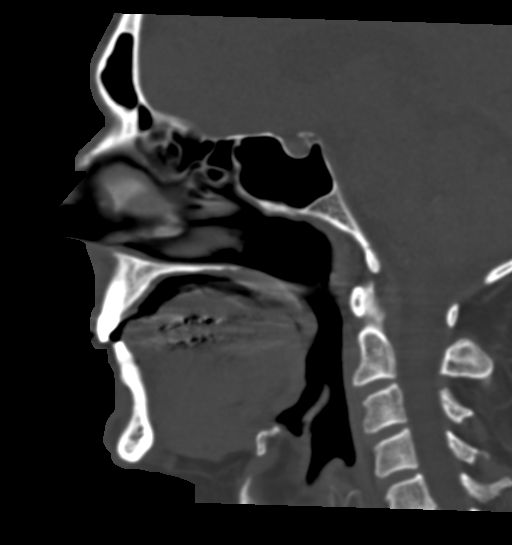
[im 56/84  bone]
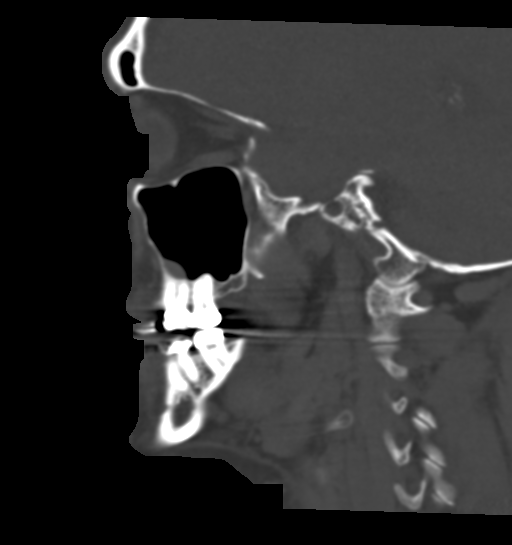

[15 of 47 positions shown; findings below may reference images not displayed]

FINDINGS: Bilateral right more than left opacification of mastoid air cells,
small volume. There is bony sclerosis in the right mastoid tip.
Extent is unchanged from prior, despite history of interval
antibiotics. No coalescent changes or erosion. Clear middle ears.
Negative nasopharynx.

Osseous: No fracture or mandibular dislocation. No destructive
process.

Orbits: Unremarkable

Sinuses: Minor patchy mucosal thickening in the paranasal sinuses
without fluid level or obstruction. Maxillary infundibula, frontal
ethmoidal recesses, and sphenoid ethmoidal recesses are patent.
Paradoxical rotation of the right middle turbinate without nasal
cavity stenosis. Small leftward nasal septal spur contacting the
inferior turbinate.

Soft tissues: Negative

Limited intracranial: Negative
IMPRESSION: 1. Mild chronic mastoiditis without change from brain MRI
08/11/2016. No erosive changes or nasopharyngeal obstruction.
2. Minimal mucosal thickening in the paranasal sinuses without
obstruction.

## 2018-07-21 ENCOUNTER — Other Ambulatory Visit: Payer: Self-pay | Admitting: Neurology

## 2018-07-30 ENCOUNTER — Encounter: Payer: Self-pay | Admitting: Neurology

## 2018-07-30 ENCOUNTER — Ambulatory Visit (INDEPENDENT_AMBULATORY_CARE_PROVIDER_SITE_OTHER): Payer: Medicare Other | Admitting: Neurology

## 2018-07-30 VITALS — BP 122/62 | HR 64 | Ht 69.0 in | Wt 174.0 lb

## 2018-07-30 DIAGNOSIS — G250 Essential tremor: Secondary | ICD-10-CM

## 2018-07-30 DIAGNOSIS — G43109 Migraine with aura, not intractable, without status migrainosus: Secondary | ICD-10-CM

## 2018-07-30 DIAGNOSIS — I2510 Atherosclerotic heart disease of native coronary artery without angina pectoris: Secondary | ICD-10-CM | POA: Diagnosis not present

## 2018-07-30 MED ORDER — PROPRANOLOL HCL ER 160 MG PO CP24: ORAL_CAPSULE | ORAL | 5 refills | Status: DC

## 2018-07-30 NOTE — Progress Notes (Signed)
NEUROLOGY FOLLOW UP OFFICE NOTE  [name] [id number]  HISTORY OF PRESENT ILLNESS: [name] is a 72 year old right-handed male with osteoarthritis, hypertension, type 2 diabetes mellitus and hyperlipidemia who follows up for migraine and essential tremor.  He is accompanied by his wife who supplements history.  UPDATE: I  Migraine:  Intensity:  Moderate to severe Duration:  2 hours but postdrome Frequency:  2 to 3 days a month (in July, one migraine lasted 3 days) Frequency of abortive medication: infrequent Rescue protocol:  Ginger tea with Tylenol Current NSAIDS:  piroxiicam daily (for arthritis) Current analgesics:  no Current triptans:  no Current ergotamine:  no Current anti-emetic:  no Current muscle relaxants:  no Current anti-anxiolytic:  no Current sleep aide:  no Current Antihypertensive medications:  Losartan-HCTZ, propranolol ER 160mg  Current Antidepressant medications:  no Current Anticonvulsant medications:  no Current anti-CGRP:  no Current Vitamins/Herbal/Supplements: Ginger tea, biotin, multivitamin, fish oil, coenzyme Q 10, cinnamon, gingerroot, turmeric 500 mg, magnesium 250 mg, riboflavin 400 mg, butterbur 150 mg Current Antihistamines/Decongestants: Flonase Other therapy: Treat acutely with ginger tea and rest.  Caffeine: 2 to 3 cups of coffee daily Alcohol: No Smoker: No Diet: Hydrates.  No soda. Exercise: Yes Depression: Stable; Anxiety: Stable Other pain:  no Sleep hygiene: He wakes up every hour to urinate  II Essential Tremor:  He is taking propranolol ER 160 mg daily.  Tremors are stable  HISTORY: I Migraine:  Onset: 40 to 50 years ago.  They would occur once or twice a year.  In September, they started to occur once or twice a week and then almost daily.  Over the past couple of weeks (following tympanostomy tube placement), they have occurred about once a week. Location:  Top of head Quality:  explode Initial Intensity:   10/10 Aura:  Flashing lights/colors in vision Prodrome:  no Postdrome:  Hangover effect for up to 5 days Associated symptoms: Nausea, photophobia, phonophobia.  Denies unilateral numbness and weakness. Initial Duration:  Several hours Initial Frequency:   In September, they started to occur once or twice a week and then almost daily.  Over the past couple of weeks (following tympanostomy tube placement), they have occurred about once a week. Triggers/aggravating factors: Emotional stress, sound, NTG, light, wine, eye strain Relieving factors:  sleep Activity:  Needs to lay down  Past NSAIDS:  Celebrex Past analgesics:  no Past abortive triptans:  Sumatriptan (stopped due to CAD). Past muscle relaxants:  no Past anti-emetic:  no Past antihypertensive medications:  Propranolol (many years ago) Past antidepressant medications:  Prozac, amitriptyline Past anticonvulsant medications:  no Past vitamins/Herbal/Supplements:  no Other past therapies:  Biofeedback, relaxation therapy  MRI of brain without contrast from 08/11/16 was personally reviewed and revealed mild chronic small vessel ischemic changes but no acute stroke, bleed or mass lesion.  However, it did reveal mild mastoiditis.  He saw ENT and had tympanostomy tube placement on 09/22/16.    II  Essential Tremor: For over 10 years, he has had a tremor, particularly in his right hand.  It has become more noticeable.  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  There is no known family history.  He finds it difficult to swallow the propranolol and wonders if there is a coated version.  PAST MEDICAL HISTORY: Past Medical History:  Diagnosis Date  . Allergy   . Arthritis   . CAD (coronary artery disease)    s/p PTCI in  2003 and 2004 to the LAD.   Marland Kitchen Chronic tubotympanic suppurative otitis media, bilateral   . Depression   . Diabetes mellitus   . Eustachian tube dysfunction   . History of placement of ear  tubes   . Hyperlipidemia   . Hypertension   . Hypothyroid   . Internal hemorrhoids   . Migraine headache   . Skin cancer, basal cell   . Thrombocytopenia ([place])   . Thrombocytopenia, unspecified ([place]) 05/08/2013  . TIA (transient ischemic attack) 2006   per patient's report.  He was never officially given this diagnosis    MEDICATIONS: Current Outpatient Medications on File Prior to Visit  Medication Sig Dispense Refill  . aspirin 81 MG tablet Take 81 mg by mouth daily.      Marland Kitchen atorvastatin (LIPITOR) 40 MG tablet TAKE 1 TABLET(40 MG) BY MOUTH DAILY 30 tablet 11  . Biotin 1000 MCG tablet Take 1,000 mcg by mouth daily.     . cetirizine (ZYRTEC ALLERGY) 10 MG tablet Take 1 tablet (10 mg total) by mouth daily. (Patient not taking: Reported on 04/19/2018) 90 tablet 1  . Cinnamon 500 MG capsule Take 1,000 mg by mouth 2 (two) times daily.     . Coenzyme Q10 (COQ-10) 100 MG capsule Take 100 mg by mouth 2 (two) times daily.     Marland Kitchen ezetimibe (ZETIA) 10 MG tablet Take 1 tablet (10 mg total) by mouth daily. 30 tablet 5  . FEVERFEW PO Take 1 capsule by mouth 3 (three) times daily.    . fluticasone (FLONASE) 50 MCG/ACT nasal spray Place 2 sprays into both nostrils daily. (Patient taking differently: Place 2 sprays into both nostrils daily as needed. ) 48 g 1  . Ginger, Zingiber officinalis, (GINGER ROOT) 550 MG CAPS Take 1 capsule by mouth daily.      Marland Kitchen levothyroxine (SYNTHROID) 112 MCG tablet Take 1 tablet (112 mcg total) by mouth at bedtime. 90 tablet 1  . losartan-hydrochlorothiazide (HYZAAR) 100-12.5 MG tablet Take 1 tablet by mouth daily. 30 tablet 11  . magnesium 30 MG tablet Take 30 mg by mouth 2 (two) times daily.    . metFORMIN (GLUCOPHAGE) 500 MG tablet Take 1 tablet (500 mg total) by mouth 2 (two) times daily with a meal. 60 tablet 5  . metFORMIN (GLUCOPHAGE) 500 MG tablet TAKE 1 TABLET BY MOUTH TWICE DAILY WITH A MEAL 180 tablet 0  . multivitamin (THERAGRAN) per tablet Take 1 tablet by mouth  daily.      . Omega-3 Fatty Acids (FISH OIL) 1200 MG CAPS Take 1 capsule by mouth 2 (two) times daily.     Marland Kitchen OVER THE COUNTER MEDICATION Migra-eeze: Take 1 gel capsule two times daily.    . piroxicam (FELDENE) 10 MG capsule 1-2 daily prn 200 capsule 1  . propranolol ER (INDERAL LA) 160 MG SR capsule TAKE 1 CAPSULE(160 MG) BY MOUTH DAILY 30 capsule 0  . tamsulosin (FLOMAX) 0.4 MG CAPS capsule Take 1 capsule (0.4 mg total) by mouth daily. Reported on 09/23/2015 30 capsule   . Turmeric 500 MG CAPS Take 1 capsule by mouth daily.     No current facility-administered medications on file prior to visit.     ALLERGIES: Allergies  Allergen Reactions  . Sulfa Antibiotics Other (See Comments)  . Sulfamethoxazole-Trimethoprim Nausea And Vomiting  . Codeine   . Crestor [Rosuvastatin Calcium]   . Hydrocodone-Acetaminophen     vicodin   . Other     Apples and tomatoes....Marland KitchenMarland Kitchen  Nausea and migraines.    . Rosuvastatin     REACTION: REALLY BAD JOINT/MUSCLE PAIN    FAMILY HISTORY: Family History  Problem Relation Age of Onset  . Mental illness Mother        alzheimers  . Heart disease Mother   . Heart disease Father        triple by pass  . Hypertension Brother        Prostate Cancer  . Prostate cancer Brother   . Hypertension Brother   . Hypertension Brother   . COPD Brother   . Colon cancer Neg Hx   . Colon polyps Neg Hx   . Rectal cancer Neg Hx   . Stomach cancer Neg Hx    SOCIAL HISTORY: Social History   Socioeconomic History  . Marital status: Married    Spouse name: Not on file  . Number of children: 0  . Years of education: Not on file  . Highest education level: Not on file  Occupational History  . Occupation: retired    Fish farm manager: RETIRED    Comment: accountant  Social Needs  . Financial resource strain: Not on file  . Food insecurity:    Worry: Not on file    Inability: Not on file  . Transportation needs:    Medical: Not on file    Non-medical: Not on file   Tobacco Use  . Smoking status: Former Smoker    Years: 5.00    Types: Cigarettes    Last attempt to quit: 10/10/1965    Years since quitting: 52.8  . Smokeless tobacco: Never Used  Substance and Sexual Activity  . Alcohol use: No    Alcohol/week: 0.0 standard drinks  . Drug use: No  . Sexual activity: Yes    Partners: Female  Lifestyle  . Physical activity:    Days per week: Not on file    Minutes per session: Not on file  . Stress: Not on file  Relationships  . Social connections:    Talks on phone: Not on file    Gets together: Not on file    Attends religious service: Not on file    Active member of club or organization: Not on file    Attends meetings of clubs or organizations: Not on file    Relationship status: Not on file  . Intimate partner violence:    Fear of current or ex partner: Not on file    Emotionally abused: Not on file    Physically abused: Not on file    Forced sexual activity: Not on file  Other Topics Concern  . Not on file  Social History Narrative   Exercise-- treadmill, stationary bike    REVIEW OF SYSTEMS: Constitutional: No fevers, chills, or sweats, no generalized fatigue, change in appetite Eyes: No visual changes, double vision, eye pain Ear, nose and throat: No hearing loss, ear pain, nasal congestion, sore throat Cardiovascular: No chest pain, palpitations Respiratory:  No shortness of breath at rest or with exertion, wheezes GastrointestinaI: No nausea, vomiting, diarrhea, abdominal pain, fecal incontinence Genitourinary:  No dysuria, urinary retention or frequency Musculoskeletal:  No neck pain, back pain Integumentary: No rash, pruritus, skin lesions Neurological: as above Psychiatric: No depression, insomnia, anxiety Endocrine: No palpitations, fatigue, diaphoresis, mood swings, change in appetite, change in weight, increased thirst Hematologic/Lymphatic:  No purpura, petechiae. Allergic/Immunologic: no itchy/runny eyes, nasal  congestion, recent allergic reactions, rashes  PHYSICAL EXAM: Blood pressure 122/62, pulse 64, height 5\' 9"  (1.753  m), weight 174 lb (78.9 kg), SpO2 93 %. General: No acute distress.  Patient appears well-groomed. Head:  Normocephalic/atraumatic Eyes:  Fundi examined but not visualized Neck: supple, no paraspinal tenderness, full range of motion Heart:  Regular rate and rhythm Lungs:  Clear to auscultation bilaterally Back: No paraspinal tenderness Neurological Exam: alert and oriented to person, place, and time. Attention span and concentration intact, recent and remote memory intact, fund of knowledge intact.  Speech fluent and not dysarthric, language intact.  CN II-XII intact. Bulk and tone normal, muscle strength 5/5 throughout.  Sensation to light touch intact.  Deep tendon reflexes 2+ throughout, toes downgoing.  Finger to nose testing intact.  Gait normal, Romberg negative.  IMPRESSION: 1.  Migraine with aura, without status migrainosus, not intractable 2.  Essential tremor, stable  PLAN: 1. As he is interested in the [facility], we will prescribe to him.  2. For preventative management, continue propranolol ER 160 mg daily for migraine prophylaxis and essential tremor 3.  For abortive therapy, ginger tea with Tylenol 4.  Limit use of pain relievers to no more than 2 days out of week to prevent risk of rebound or medication-overuse headache. 5.  Keep headache diary 6.  Exercise, hydration, caffeine cessation, sleep hygiene, monitor for and avoid triggers 7.  Consider:  magnesium citrate 400mg  daily, riboflavin 400mg  daily, and coenzyme Q10 100mg  three times daily 8.  Follow up in 6 months  Metta Clines, DO  CC: Roma Schanz, DO

## 2018-07-30 NOTE — Patient Instructions (Signed)
1.  Propranolol ER 160mg  daily refilled 2.  Will prescribe the Cefaly 3.  Follow up in 6 months.

## 2018-07-30 NOTE — Addendum Note (Signed)
Addended by: Clois Comber on: 07/30/2018 03:48 PM   Modules accepted: Orders

## 2018-08-02 DIAGNOSIS — Z23 Encounter for immunization: Secondary | ICD-10-CM | POA: Diagnosis not present

## 2018-08-25 ENCOUNTER — Encounter: Payer: Self-pay | Admitting: Family Medicine

## 2018-09-13 DIAGNOSIS — R972 Elevated prostate specific antigen [PSA]: Secondary | ICD-10-CM | POA: Diagnosis not present

## 2018-09-19 DIAGNOSIS — R3912 Poor urinary stream: Secondary | ICD-10-CM | POA: Diagnosis not present

## 2018-09-19 DIAGNOSIS — R972 Elevated prostate specific antigen [PSA]: Secondary | ICD-10-CM | POA: Diagnosis not present

## 2018-09-19 DIAGNOSIS — N401 Enlarged prostate with lower urinary tract symptoms: Secondary | ICD-10-CM | POA: Diagnosis not present

## 2018-09-19 DIAGNOSIS — N5201 Erectile dysfunction due to arterial insufficiency: Secondary | ICD-10-CM | POA: Diagnosis not present

## 2018-09-28 DIAGNOSIS — H2513 Age-related nuclear cataract, bilateral: Secondary | ICD-10-CM | POA: Diagnosis not present

## 2018-09-28 DIAGNOSIS — H52203 Unspecified astigmatism, bilateral: Secondary | ICD-10-CM | POA: Diagnosis not present

## 2018-09-28 DIAGNOSIS — E119 Type 2 diabetes mellitus without complications: Secondary | ICD-10-CM | POA: Diagnosis not present

## 2018-09-28 LAB — HM DIABETES EYE EXAM

## 2018-10-05 ENCOUNTER — Encounter: Payer: Self-pay | Admitting: *Deleted

## 2018-10-18 ENCOUNTER — Encounter: Payer: Self-pay | Admitting: Family Medicine

## 2018-10-18 ENCOUNTER — Ambulatory Visit (INDEPENDENT_AMBULATORY_CARE_PROVIDER_SITE_OTHER): Payer: Medicare Other | Admitting: Family Medicine

## 2018-10-18 VITALS — BP 128/78 | HR 60 | Temp 97.5°F | Resp 16 | Ht 69.0 in | Wt 177.0 lb

## 2018-10-18 DIAGNOSIS — J301 Allergic rhinitis due to pollen: Secondary | ICD-10-CM

## 2018-10-18 DIAGNOSIS — H6523 Chronic serous otitis media, bilateral: Secondary | ICD-10-CM

## 2018-10-18 DIAGNOSIS — I10 Essential (primary) hypertension: Secondary | ICD-10-CM

## 2018-10-18 DIAGNOSIS — IMO0002 Reserved for concepts with insufficient information to code with codable children: Secondary | ICD-10-CM

## 2018-10-18 DIAGNOSIS — E1151 Type 2 diabetes mellitus with diabetic peripheral angiopathy without gangrene: Secondary | ICD-10-CM

## 2018-10-18 DIAGNOSIS — R351 Nocturia: Secondary | ICD-10-CM

## 2018-10-18 DIAGNOSIS — R1310 Dysphagia, unspecified: Secondary | ICD-10-CM

## 2018-10-18 DIAGNOSIS — E1165 Type 2 diabetes mellitus with hyperglycemia: Secondary | ICD-10-CM | POA: Diagnosis not present

## 2018-10-18 DIAGNOSIS — E039 Hypothyroidism, unspecified: Secondary | ICD-10-CM

## 2018-10-18 DIAGNOSIS — M159 Polyosteoarthritis, unspecified: Secondary | ICD-10-CM | POA: Diagnosis not present

## 2018-10-18 DIAGNOSIS — Z0000 Encounter for general adult medical examination without abnormal findings: Secondary | ICD-10-CM

## 2018-10-18 DIAGNOSIS — H9193 Unspecified hearing loss, bilateral: Secondary | ICD-10-CM | POA: Diagnosis not present

## 2018-10-18 DIAGNOSIS — E785 Hyperlipidemia, unspecified: Secondary | ICD-10-CM

## 2018-10-18 HISTORY — DX: Dysphagia, unspecified: R13.10

## 2018-10-18 LAB — COMPREHENSIVE METABOLIC PANEL
ALT: 17 U/L (ref 0–53)
AST: 17 U/L (ref 0–37)
Albumin: 3.9 g/dL (ref 3.5–5.2)
Alkaline Phosphatase: 69 U/L (ref 39–117)
BUN: 21 mg/dL (ref 6–23)
CO2: 30 mEq/L (ref 19–32)
Calcium: 9.2 mg/dL (ref 8.4–10.5)
Chloride: 108 mEq/L (ref 96–112)
Creatinine, Ser: 1.29 mg/dL (ref 0.40–1.50)
GFR: 57.75 mL/min — ABNORMAL LOW (ref >60.00)
Glucose, Bld: 107 mg/dL — ABNORMAL HIGH (ref 70–99)
Potassium: 4.7 mEq/L (ref 3.5–5.1)
Sodium: 144 mEq/L (ref 135–145)
Total Bilirubin: 0.7 mg/dL (ref 0.2–1.2)
Total Protein: 6.2 g/dL (ref 6.0–8.3)

## 2018-10-18 LAB — LIPID PANEL
Cholesterol: 96 mg/dL (ref 0–200)
HDL: 51.8 mg/dL (ref >39.00)
LDL CALC: 34 mg/dL (ref 0–99)
NonHDL: 44.4
Total CHOL/HDL Ratio: 2
Triglycerides: 54 mg/dL (ref 0.0–149.0)
VLDL: 10.8 mg/dL (ref 0.0–40.0)

## 2018-10-18 LAB — PSA: PSA: 5.08 ng/mL — ABNORMAL HIGH (ref 0.10–4.00)

## 2018-10-18 LAB — HEMOGLOBIN A1C: Hgb A1c MFr Bld: 6 % (ref 4.6–6.5)

## 2018-10-18 LAB — TSH: TSH: 2.23 u[IU]/mL (ref 0.35–4.50)

## 2018-10-18 MED ORDER — EZETIMIBE 10 MG PO TABS: 10.0000 mg | ORAL_TABLET | Freq: Every day | ORAL | 5 refills | Status: DC

## 2018-10-18 MED ORDER — METFORMIN HCL 500 MG PO TABS: 500.0000 mg | ORAL_TABLET | Freq: Two times a day (BID) | ORAL | 5 refills | Status: DC

## 2018-10-18 MED ORDER — FLUTICASONE PROPIONATE 50 MCG/ACT NA SUSP: 2.0000 | Freq: Every day | NASAL | 1 refills | Status: DC

## 2018-10-18 MED ORDER — PIROXICAM 10 MG PO CAPS: ORAL_CAPSULE | ORAL | 1 refills | Status: DC

## 2018-10-18 MED ORDER — LEVOTHYROXINE SODIUM 112 MCG PO TABS: 112.0000 ug | ORAL_TABLET | Freq: Every day | ORAL | 1 refills | Status: DC

## 2018-10-18 MED ORDER — AZELASTINE HCL 0.1 % NA SOLN: 1.0000 | Freq: Two times a day (BID) | NASAL | 12 refills | Status: DC

## 2018-10-18 NOTE — Progress Notes (Signed)
Patient ID: TRIG MCBRYAR, male    DOB: 24-Mar-1946  Age: 73 y.o. MRN: [id number]    Subjective:  Subjective  HPI [name] presents for f/u dm, chol and bp.    Pt also c/o food getting stuck when swallowing and occasionally he has to cough it up.   His brother and father had esophageal strictures.   He also still has problems with fluid in the ears and allergies maybe.  ent has not been able to fix it for long.  He has seen 2 ENT with no help.  He is using flonase which is not helping.    HYPERTENSION   Blood pressure range-no  Chest pain- no      Dyspnea- no Lightheadedness- no   Edema- no  Other side effects - no   Medication compliance: good  Low salt diet- yes     DIABETES    Blood Sugar ranges-not checking   Polyuria- no New Visual problems- no  Hypoglycemic symptoms- no  Other side effects-no Medication compliance - yes Last eye exam- 09/2018 Foot exam- today   HYPERLIPIDEMIA  Medication compliance- good RUQ pain- no  Muscle aches- no Other side effects-no     Review of Systems  Constitutional: Negative for appetite change, diaphoresis, fatigue and unexpected weight change.  HENT: Positive for congestion, ear pain and hearing loss.   Eyes: Negative for pain, redness and visual disturbance.  Respiratory: Negative for cough, chest tightness, shortness of breath and wheezing.   Cardiovascular: Negative for chest pain, palpitations and leg swelling.  Gastrointestinal: Negative for abdominal pain, constipation, nausea and vomiting.  Endocrine: Negative for cold intolerance, heat intolerance, polydipsia, polyphagia and polyuria.  Genitourinary: Negative for difficulty urinating, dysuria and frequency.  Musculoskeletal: Negative.   Skin: Negative.   Neurological: Negative for dizziness, light-headedness, numbness and headaches.  Psychiatric/Behavioral: Negative.     History Past Medical History:  Diagnosis Date  . Allergy   . Arthritis   . CAD (coronary  artery disease)    s/p PTCI in 2003 and 2004 to the LAD.   Marland Kitchen Chronic tubotympanic suppurative otitis media, bilateral   . Depression   . Diabetes mellitus   . Eustachian tube dysfunction   . History of placement of ear tubes   . Hyperlipidemia   . Hypertension   . Hypothyroid   . Internal hemorrhoids   . Migraine headache   . Skin cancer, basal cell   . Thrombocytopenia ([place])   . Thrombocytopenia, unspecified ([place]) 05/08/2013  . TIA (transient ischemic attack) 2006   per patient's report.  He was never officially given this diagnosis    He has a past surgical history that includes Hernia repair (2007); fatty tissue removed; Thyroidectomy, partial (07/2002); coronary artery disease status post placement; right toe bone spur surgery; Tonsillectomy; Colonoscopy (2011); and Polypectomy (2011).   His family history includes COPD in his brother; Heart disease in his father and mother; Hypertension in his brother, brother, and brother; Mental illness in his mother; Prostate cancer in his brother.He reports that he quit smoking about 53 years ago. His smoking use included cigarettes. He quit after 5.00 years of use. He has never used smokeless tobacco. He reports that he does not drink alcohol or use drugs.  Current Outpatient Medications on File Prior to Visit  Medication Sig Dispense Refill  . aspirin 81 MG tablet Take 81 mg by mouth daily.      Marland Kitchen atorvastatin (LIPITOR) 40 MG tablet TAKE 1 TABLET(40 MG)  BY MOUTH DAILY 30 tablet 11  . Biotin 1000 MCG tablet Take 1,000 mcg by mouth daily.     . cetirizine (ZYRTEC ALLERGY) 10 MG tablet Take 1 tablet (10 mg total) by mouth daily. 90 tablet 1  . Cinnamon 500 MG capsule Take 1,000 mg by mouth 2 (two) times daily.     . Coenzyme Q10 (COQ-10) 100 MG capsule Take 100 mg by mouth 2 (two) times daily.     . Ginger, Zingiber officinalis, (GINGER ROOT) 550 MG CAPS Take 1 capsule by mouth daily.      Marland Kitchen losartan-hydrochlorothiazide (HYZAAR) 100-12.5 MG  tablet Take 1 tablet by mouth daily. 30 tablet 11  . magnesium 30 MG tablet Take 30 mg by mouth 2 (two) times daily.    . multivitamin (THERAGRAN) per tablet Take 1 tablet by mouth daily.      . Nerve Stimulator (CEFALY KIT) DEVI by Does not apply route.    . Omega-3 Fatty Acids (FISH OIL) 1200 MG CAPS Take 1 capsule by mouth 2 (two) times daily.     Marland Kitchen OVER THE COUNTER MEDICATION Migra-eeze: Take 1 gel capsule two times daily.    . propranolol ER (INDERAL LA) 160 MG SR capsule TAKE 1 CAPSULE(160 MG) BY MOUTH DAILY 30 capsule 5  . tamsulosin (FLOMAX) 0.4 MG CAPS capsule Take 1 capsule (0.4 mg total) by mouth daily. Reported on 09/23/2015 30 capsule   . Turmeric 500 MG CAPS Take 1 capsule by mouth daily.     No current facility-administered medications on file prior to visit.      Objective:  Objective  Physical Exam Vitals signs and nursing note reviewed.  Constitutional:      General: He is sleeping.     Appearance: He is well-developed.  HENT:     Head: Normocephalic and atraumatic.     Right Ear: Decreased hearing noted. A PE tube is present.     Left Ear: Decreased hearing noted. Tympanic membrane is perforated.  Eyes:     Pupils: Pupils are equal, round, and reactive to light.  Neck:     Musculoskeletal: Normal range of motion and neck supple.     Thyroid: No thyromegaly.  Cardiovascular:     Rate and Rhythm: Normal rate and regular rhythm.     Heart sounds: No murmur.  Pulmonary:     Effort: Pulmonary effort is normal. No respiratory distress.     Breath sounds: Normal breath sounds. No wheezing or rales.  Chest:     Chest wall: No tenderness.  Musculoskeletal:        General: No tenderness.  Skin:    General: Skin is warm and dry.  Neurological:     Mental Status: He is oriented to person, place, and time.  Psychiatric:        Behavior: Behavior normal.        Thought Content: Thought content normal.        Judgment: Judgment normal.    Diabetic Foot Exam -  Simple   Simple Foot Form Diabetic Foot exam was performed with the following findings:  Yes 10/18/2018  8:40 PM  Visual Inspection No deformities, no ulcerations, no other skin breakdown bilaterally:  Yes Sensation Testing Intact to touch and monofilament testing bilaterally:  Yes Pulse Check Posterior Tibialis and Dorsalis pulse intact bilaterally:  Yes Comments    BP 128/78 (BP Location: Left Arm, Cuff Size: Normal)   Pulse 60   Temp (!) 97.5 F (36.4 C) (  Oral)   Resp 16   Ht '5\' 9"'  (1.753 m)   Wt 177 lb (80.3 kg)   SpO2 96%   BMI 26.14 kg/m  Wt Readings from Last 3 Encounters:  10/18/18 177 lb (80.3 kg)  07/30/18 174 lb (78.9 kg)  04/19/18 174 lb 9.6 oz (79.2 kg)     Lab Results  Component Value Date   WBC 6.6 05/17/2017   HGB 14.5 05/17/2017   HCT 39.5 05/17/2017   PLT 107 (L) 05/17/2017   GLUCOSE 107 (H) 10/18/2018   CHOL 96 10/18/2018   TRIG 54.0 10/18/2018   HDL 51.80 10/18/2018   LDLCALC 34 10/18/2018   ALT 17 10/18/2018   AST 17 10/18/2018   NA 144 10/18/2018   K 4.7 10/18/2018   CL 108 10/18/2018   CREATININE 1.29 10/18/2018   BUN 21 10/18/2018   CO2 30 10/18/2018   TSH 2.23 10/18/2018   PSA 5.08 (H) 10/18/2018   INR 1.0 RATIO 12/11/2008   HGBA1C 6.0 10/18/2018   MICROALBUR 2.5 (H) 09/24/2015    Mr Prostate W Wo Contrast  Result Date: 01/02/2018 CLINICAL DATA:  Elevated PSA, benign biopsy EXAM: MR PROSTATE WITHOUT AND WITH CONTRAST TECHNIQUE: Multiplanar multisequence MRI images were obtained of the pelvis centered about the prostate. Pre and post contrast images were obtained. CONTRAST:  33m MULTIHANCE GADOBENATE DIMEGLUMINE 529 MG/ML IV SOLN COMPARISON:  None. FINDINGS: Prostate: 13 x 5 x 6 mm focal low T2 lesion in the posteromedial right mid peripheral zone (series 7/image 22). Associated early arterial enhancement (series 26/image 19). Associated restricted diffusion/low ADC (series 11/image 17). This appearance is suspicious for a small focus of  high-grade macroscopic prostate cancer (PI-RADS 4). Enlargement/nodularity central gland, suspicious for BPH. No suspicious central gland nodule on T2. Volume: 5.4 x 4.6 x 5.3 cm (calculated volume 69.0 mL) Transcapsular spread:  Absent. Seminal vesicle involvement: Absent. Neurovascular bundle involvement: Absent. Pelvic adenopathy: Absent. Bone metastasis: Absent. Other findings: Bladder is mildly thick-walled although underdistended. IMPRESSION: 13 mm lesion in the posteromedial right mid peripheral zone, suspicious for small focus of high-grade macroscopic prostate cancer. PI-RADS 4. No evidence of extracapsular extension, seminal vesicle invasion, or metastatic disease. Enlargement of the central gland, suggesting BPH. Thick-walled bladder, likely reflecting chronic bladder outlet obstruction. Electronically Signed   By: SJulian HyM.D.   On: 01/02/2018 14:15     Assessment & Plan:  Plan  I have discontinued JGovernor RooksGray's FEVERFEW PO. I am also having him start on azelastine. Additionally, I am having him maintain his aspirin, Biotin, multivitamin, Fish Oil, CoQ-10, Cinnamon, Ginger Root, Turmeric, magnesium, tamsulosin, OVER THE COUNTER MEDICATION, cetirizine, atorvastatin, losartan-hydrochlorothiazide, propranolol ER, CEFALY KIT, levothyroxine, piroxicam, metFORMIN, ezetimibe, and fluticasone.  Meds ordered this encounter  Medications  . levothyroxine (SYNTHROID) 112 MCG tablet    Sig: Take 1 tablet (112 mcg total) by mouth at bedtime.    Dispense:  90 tablet    Refill:  1  . piroxicam (FELDENE) 10 MG capsule    Sig: 1-2 daily prn    Dispense:  200 capsule    Refill:  1  . metFORMIN (GLUCOPHAGE) 500 MG tablet    Sig: Take 1 tablet (500 mg total) by mouth 2 (two) times daily with a meal.    Dispense:  60 tablet    Refill:  5    Please delete rx for Glumetza.  .Marland Kitchenezetimibe (ZETIA) 10 MG tablet    Sig: Take 1 tablet (10 mg total) by mouth  daily.    Dispense:  30 tablet     Refill:  5  . fluticasone (FLONASE) 50 MCG/ACT nasal spray    Sig: Place 2 sprays into both nostrils daily.    Dispense:  48 g    Refill:  1  . azelastine (ASTELIN) 0.1 % nasal spray    Sig: Place 1 spray into both nostrils 2 (two) times daily. Use in each nostril as directed    Dispense:  30 mL    Refill:  12    Problem List Items Addressed This Visit      Unprioritized   Chronic serous otitis media    Pt requesting referral to [facility]       Relevant Medications   azelastine (ASTELIN) 0.1 % nasal spray   Other Relevant Orders   Ambulatory referral to ENT   DM (diabetes mellitus) type II uncontrolled, periph vascular disorder ([place])    Check labs hgba1c to be checked, minimize simple carbs. Increase exercise as tolerated. Continue current meds       Relevant Medications   metFORMIN (GLUCOPHAGE) 500 MG tablet   ezetimibe (ZETIA) 10 MG tablet   Other Relevant Orders   PSA (Completed)   Comprehensive metabolic panel (Completed)   Hemoglobin A1c (Completed)   Dysphagia    Refer to GI  ? Esophageal stricture       Relevant Orders   Ambulatory referral to Gastroenterology   Essential hypertension    Well controlled, no changes to meds. Encouraged heart healthy diet such as the DASH diet and exercise as tolerated.       Relevant Medications   ezetimibe (ZETIA) 10 MG tablet   Hyperlipidemia LDL goal <70    Tolerating statin, encouraged heart healthy diet, avoid trans fats, minimize simple carbs and saturated fats. Increase exercise as tolerated      Relevant Medications   ezetimibe (ZETIA) 10 MG tablet   Other Relevant Orders   Lipid panel (Completed)   PSA (Completed)   Comprehensive metabolic panel (Completed)   Hypothyroidism    Stable  Check labs      Relevant Medications   levothyroxine (SYNTHROID) 112 MCG tablet   Other Relevant Orders   PSA (Completed)   TSH (Completed)   Nocturia more than twice per night    Check psa Symptoms stable       Preventative health care - Primary    ghm utd  Check labs  See avs       Other Visit Diagnoses    Osteoarthritis of multiple joints, unspecified osteoarthritis type       Relevant Medications   piroxicam (FELDENE) 10 MG capsule   Chronic seasonal allergic rhinitis due to pollen       Relevant Medications   fluticasone (FLONASE) 50 MCG/ACT nasal spray   Other Relevant Orders   PSA (Completed)   Bilateral hearing loss, unspecified hearing loss type       Relevant Medications   azelastine (ASTELIN) 0.1 % nasal spray   Other Relevant Orders   Ambulatory referral to ENT   Nocturia       Relevant Orders   PSA (Completed)      Follow-up: Return in about 6 months (around 04/18/2019), or if symptoms worsen or fail to improve, for hypertension, hyperlipidemia, diabetes II.  [name], DO

## 2018-10-18 NOTE — Assessment & Plan Note (Signed)
Well controlled, no changes to meds. Encouraged heart healthy diet such as the DASH diet and exercise as tolerated.  °

## 2018-10-18 NOTE — Assessment & Plan Note (Signed)
Check labs  hgba1c to be checked, minimize simple carbs. Increase exercise as tolerated. Continue current meds  

## 2018-10-18 NOTE — Assessment & Plan Note (Signed)
Pt requesting referral to [facility]

## 2018-10-18 NOTE — Assessment & Plan Note (Signed)
ghm utd Check labs  See avs  

## 2018-10-18 NOTE — Assessment & Plan Note (Signed)
Stable Check labs 

## 2018-10-18 NOTE — Assessment & Plan Note (Signed)
Check psa Symptoms stable

## 2018-10-18 NOTE — Patient Instructions (Signed)
Preventive Care 2 Years and Older, Male Preventive care refers to lifestyle choices and visits with your health care provider that can promote health and wellness. What does preventive care include?   A yearly physical exam. This is also called an annual well check.  Dental exams once or twice a year.  Routine eye exams. Ask your health care provider how often you should have your eyes checked.  Personal lifestyle choices, including: ? Daily care of your teeth and gums. ? Regular physical activity. ? Eating a healthy diet. ? Avoiding tobacco and drug use. ? Limiting alcohol use. ? Practicing safe sex. ? Taking low doses of aspirin every day. ? Taking vitamin and mineral supplements as recommended by your health care provider. What happens during an annual well check? The services and screenings done by your health care provider during your annual well check will depend on your age, overall health, lifestyle risk factors, and family history of disease. Counseling Your health care provider may ask you questions about your:  Alcohol use.  Tobacco use.  Drug use.  Emotional well-being.  Home and relationship well-being.  Sexual activity.  Eating habits.  History of falls.  Memory and ability to understand (cognition).  Work and work Statistician. Screening You may have the following tests or measurements:  Height, weight, and BMI.  Blood pressure.  Lipid and cholesterol levels. These may be checked every 5 years, or more frequently if you are over 9 years old.  Skin check.  Lung cancer screening. You may have this screening every year starting at age 57 if you have a 30-pack-year history of smoking and currently smoke or have quit within the past 15 years.  Colorectal cancer screening. All adults should have this screening starting at age 90 and continuing until age 69. You will have tests every 1-10 years, depending on your results and the type of screening  test. People at increased risk should start screening at an earlier age. Screening tests may include: ? Guaiac-based fecal occult blood testing. ? Fecal immunochemical test (FIT). ? Stool DNA test. ? Virtual colonoscopy. ? Sigmoidoscopy. During this test, a flexible tube with a tiny camera (sigmoidoscope) is used to examine your rectum and lower colon. The sigmoidoscope is inserted through your anus into your rectum and lower colon. ? Colonoscopy. During this test, a long, thin, flexible tube with a tiny camera (colonoscope) is used to examine your entire colon and rectum.  Prostate cancer screening. Recommendations will vary depending on your family history and other risks.  Hepatitis C blood test.  Hepatitis B blood test.  Sexually transmitted disease (STD) testing.  Diabetes screening. This is done by checking your blood sugar (glucose) after you have not eaten for a while (fasting). You may have this done every 1-3 years.  Abdominal aortic aneurysm (AAA) screening. You may need this if you are a current or former smoker.  Osteoporosis. You may be screened starting at age 30 if you are at high risk. Talk with your health care provider about your test results, treatment options, and if necessary, the need for more tests. Vaccines Your health care provider may recommend certain vaccines, such as:  Influenza vaccine. This is recommended every year.  Tetanus, diphtheria, and acellular pertussis (Tdap, Td) vaccine. You may need a Td booster every 10 years.  Varicella vaccine. You may need this if you have not been vaccinated.  Zoster vaccine. You may need this after age 42.  Measles, mumps, and rubella (MMR) vaccine.  You may need at least one dose of MMR if you were born in 1957 or later. You may also need a second dose.  Pneumococcal 13-valent conjugate (PCV13) vaccine. One dose is recommended after age 65.  Pneumococcal polysaccharide (PPSV23) vaccine. One dose is recommended  after age 65.  Meningococcal vaccine. You may need this if you have certain conditions.  Hepatitis A vaccine. You may need this if you have certain conditions or if you travel or work in places where you may be exposed to hepatitis A.  Hepatitis B vaccine. You may need this if you have certain conditions or if you travel or work in places where you may be exposed to hepatitis B.  Haemophilus influenzae type b (Hib) vaccine. You may need this if you have certain risk factors. Talk to your health care provider about which screenings and vaccines you need and how often you need them. This information is not intended to replace advice given to you by your health care provider. Make sure you discuss any questions you have with your health care provider. Document Released: 10/23/2015 Document Revised: 11/16/2017 Document Reviewed: 07/28/2015 Elsevier Interactive Patient Education  2019 Elsevier Inc.  

## 2018-10-18 NOTE — Assessment & Plan Note (Signed)
Tolerating statin, encouraged heart healthy diet, avoid trans fats, minimize simple carbs and saturated fats. Increase exercise as tolerated 

## 2018-10-18 NOTE — Assessment & Plan Note (Signed)
Refer to GI  ? Esophageal stricture

## 2018-10-25 ENCOUNTER — Encounter: Payer: Self-pay | Admitting: *Deleted

## 2018-10-30 ENCOUNTER — Ambulatory Visit (INDEPENDENT_AMBULATORY_CARE_PROVIDER_SITE_OTHER): Payer: Medicare Other | Admitting: Nurse Practitioner

## 2018-10-30 ENCOUNTER — Encounter: Payer: Self-pay | Admitting: Nurse Practitioner

## 2018-10-30 VITALS — BP 126/80 | HR 76 | Ht 69.0 in | Wt 177.0 lb

## 2018-10-30 DIAGNOSIS — R1310 Dysphagia, unspecified: Secondary | ICD-10-CM

## 2018-10-30 NOTE — Progress Notes (Signed)
ASSESSMENT / PLAN:   73 year old male with chronic intermittent dysphagia.  Daily has problems swallowing medications but dry foods are problematic sometimes as well.  Weight is stable, no alarm features.  Patient says he can usually manage the dysphagia by eating slowly, taking small bites but episodes do occur more often than they used to -For further evaluation we will arrange for barium swallow with tablet to look for esophageal stricture.  If stricture or other structural abnormalities present then will most likely arrange for an EGD.  -In the interim advised patient to eat small bites, chew well with liquids in between bites to avoid food impaction.   HPI:    Chief Complaint:   Swallowing problems  Patient is a 73 year old male with a history of CAD / remote PCI, HTN, DM, hypothyroidism and ? TIA known to Dr. Fuller Plan.  He has a history of GERD for which I saw him in 2015.  History of adenomatous polyps in 2011.  Inflammatory polyps moved it time of last colonoscopy in 2016. Due for surveillance colonoscopy ~ Nov 2021  Patient is here with his wife for evaluation of dysphagia.  He actually has a several year history of problems swallowing pills.  He generally takes a handful of pills at the same time, the gel coated pills are especially prone to holding in his esophagus.  He also has problems swallowing certain dry foods  as well as lettuce.  Episodes of solid food dysphagia occur about once a week which is more than it used to.  A few times patient has put his finger down his throat to try and push the food through. His weight is completely stable.  He has no significant GERD symptoms, very occasional heartburn.  No regurgitation.  No odynophagia.   Data Reviewed:  Labs 10/18/2018 Comprehensive metabolic profile unremarkable   Past Medical History:  Diagnosis Date  . Allergy   . Arthritis   . CAD (coronary artery disease)    s/p PTCI in 2003 and 2004 to the LAD.   Marland Kitchen  Chronic tubotympanic suppurative otitis media, bilateral   . Depression   . Diabetes mellitus   . Eustachian tube dysfunction   . History of placement of ear tubes   . Hyperlipidemia   . Hypertension   . Hypothyroid   . Internal hemorrhoids   . Migraine headache   . Skin cancer, basal cell   . Thrombocytopenia ([place])   . Thrombocytopenia, unspecified ([place]) 05/08/2013  . TIA (transient ischemic attack) 2006   per patient's report.  He was never officially given this diagnosis     Past Surgical History:  Procedure Laterality Date  . COLONOSCOPY  2011  . coronary artery disease status post placement     of drug-eluting stent in the LAD in 2003,eEF 65% then  . fatty tissue removed     from neck 2003  . HERNIA REPAIR  2007  . POLYPECTOMY  2011   +TA  . right toe bone spur surgery    . THYROIDECTOMY, PARTIAL  07/2002   right thyroid  . TONSILLECTOMY     Family History  Problem Relation Age of Onset  . Mental illness Mother        alzheimers  . Heart disease Mother   . Heart disease Father        triple by pass  . Hypertension Brother  Prostate Cancer  . Prostate cancer Brother   . Hypertension Brother   . Hypertension Brother   . COPD Brother   . Colon cancer Neg Hx   . Colon polyps Neg Hx   . Rectal cancer Neg Hx   . Stomach cancer Neg Hx    Social History   Tobacco Use  . Smoking status: Former Smoker    Years: 5.00    Types: Cigarettes    Last attempt to quit: 10/10/1965    Years since quitting: 53.0  . Smokeless tobacco: Never Used  Substance Use Topics  . Alcohol use: No    Alcohol/week: 0.0 standard drinks  . Drug use: No   Current Outpatient Medications  Medication Sig Dispense Refill  . aspirin 81 MG tablet Take 81 mg by mouth daily.      Marland Kitchen atorvastatin (LIPITOR) 40 MG tablet TAKE 1 TABLET(40 MG) BY MOUTH DAILY 30 tablet 11  . azelastine (ASTELIN) 0.1 % nasal spray Place 1 spray into both nostrils 2 (two) times daily. Use in each nostril as  directed 30 mL 12  . Biotin 1000 MCG tablet Take 1,000 mcg by mouth daily.     . cetirizine (ZYRTEC ALLERGY) 10 MG tablet Take 1 tablet (10 mg total) by mouth daily. 90 tablet 1  . Cinnamon 500 MG capsule Take 1,000 mg by mouth 2 (two) times daily.     . Coenzyme Q10 (COQ-10) 100 MG capsule Take 100 mg by mouth 2 (two) times daily.     Marland Kitchen ezetimibe (ZETIA) 10 MG tablet Take 1 tablet (10 mg total) by mouth daily. 30 tablet 5  . Ginger, Zingiber officinalis, (GINGER ROOT) 550 MG CAPS Take 1 capsule by mouth daily.      Marland Kitchen levothyroxine (SYNTHROID) 112 MCG tablet Take 1 tablet (112 mcg total) by mouth at bedtime. 90 tablet 1  . losartan-hydrochlorothiazide (HYZAAR) 100-12.5 MG tablet Take 1 tablet by mouth daily. 30 tablet 11  . magnesium 30 MG tablet Take 30 mg by mouth 2 (two) times daily.    . metFORMIN (GLUCOPHAGE) 500 MG tablet Take 1 tablet (500 mg total) by mouth 2 (two) times daily with a meal. 60 tablet 5  . multivitamin (THERAGRAN) per tablet Take 1 tablet by mouth daily.      . Nerve Stimulator (CEFALY KIT) DEVI by Does not apply route.    Marland Kitchen OVER THE COUNTER MEDICATION Migra-eeze: Take 1 gel capsule two times daily.    . piroxicam (FELDENE) 10 MG capsule 1-2 daily prn 200 capsule 1  . propranolol ER (INDERAL LA) 160 MG SR capsule TAKE 1 CAPSULE(160 MG) BY MOUTH DAILY 30 capsule 5  . tamsulosin (FLOMAX) 0.4 MG CAPS capsule Take 1 capsule (0.4 mg total) by mouth daily. Reported on 09/23/2015 30 capsule   . Turmeric 500 MG CAPS Take 1 capsule by mouth daily.     No current facility-administered medications for this visit.    Allergies  Allergen Reactions  . Sulfa Antibiotics Other (See Comments)  . Sulfamethoxazole-Trimethoprim Nausea And Vomiting  . Sulfasalazine Other (See Comments)  . Codeine   . Crestor [Rosuvastatin Calcium]   . Hydrocodone-Acetaminophen     vicodin   . Other     Apples and tomatoes...... Nausea and migraines.    . Rosuvastatin     REACTION: REALLY BAD  JOINT/MUSCLE PAIN     Review of Systems: All systems reviewed and negative except where noted in HPI.   Serum creatinine: 1.29 mg/dL 10/18/18  0937 Estimated creatinine clearance: 50.2 mL/min   Physical Exam:    Wt Readings from Last 3 Encounters:  10/30/18 177 lb (80.3 kg)  10/18/18 177 lb (80.3 kg)  07/30/18 174 lb (78.9 kg)    BP 126/80   Pulse 76   Ht '5\' 9"'  (1.753 m)   Wt 177 lb (80.3 kg)   SpO2 97%   BMI 26.14 kg/m  Constitutional:  Pleasant male in no acute distress. Psychiatric: Normal mood and affect. Behavior is normal. EENT: Pupils normal.  Conjunctivae are normal. No scleral icterus. Neck supple.  Cardiovascular: Normal rate, regular rhythm. No edema Pulmonary/chest: Effort normal and breath sounds normal. No wheezing, rales or rhonchi. Abdominal: Soft, nondistended, nontender. Bowel sounds active throughout. There are no masses palpable. No hepatomegaly. Neurological: Alert and oriented to person place and time. Skin: Skin is warm and dry. No rashes noted.  Tye Savoy, NP  10/30/2018, 10:47 AM  Cc: Carollee Herter, Alferd Apa, *

## 2018-10-30 NOTE — Patient Instructions (Signed)
If you are age 75 or older, your body mass index should be between 23-30. Your Body mass index is 26.14 kg/m. If this is out of the aforementioned range listed, please consider follow up with your Primary Care Provider.  If you are age 44 or younger, your body mass index should be between 19-25. Your Body mass index is 26.14 kg/m. If this is out of the aformentioned range listed, please consider follow up with your Primary Care Provider.   You have been scheduled for a Barium Esophogram at [facility] (1st floor of the hospital) on 11/05/18 at 9:30 am. Please arrive 15 minutes prior to your appointment for registration. Make certain not to have anything to eat or drink 3 hours prior to your test. If you need to reschedule for any reason, please contact radiology at [phone] to do so. __________________________________________________________________ A barium swallow is an examination that concentrates on views of the esophagus. This tends to be a double contrast exam (barium and two liquids which, when combined, create a gas to distend the wall of the oesophagus) or single contrast (non-ionic iodine based). The study is usually tailored to your symptoms so a good history is essential. Attention is paid during the study to the form, structure and configuration of the esophagus, looking for functional disorders (such as aspiration, dysphagia, achalasia, motility and reflux) EXAMINATION You may be asked to change into a gown, depending on the type of swallow being performed. A radiologist and radiographer will perform the procedure. The radiologist will advise you of the type of contrast selected for your procedure and direct you during the exam. You will be asked to stand, sit or lie in several different positions and to hold a small amount of fluid in your mouth before being asked to swallow while the imaging is performed .In some instances you may be asked to swallow barium coated  marshmallows to assess the motility of a solid food bolus. The exam can be recorded as a digital or video fluoroscopy procedure. POST PROCEDURE It will take 1-2 days for the barium to pass through your system. To facilitate this, it is important, unless otherwise directed, to increase your fluids for the next 24-48hrs and to resume your normal diet.  This test typically takes about 30 minutes to perform. ________________________________________________________________ We will call you with results.  Thank you for choosing me and [facility].   Tye Savoy, NP

## 2018-10-30 NOTE — Progress Notes (Signed)
Reviewed and agree with initial management plan.  Kayela Humphres T. Kris Burd, MD FACG 

## 2018-10-31 DIAGNOSIS — B356 Tinea cruris: Secondary | ICD-10-CM | POA: Diagnosis not present

## 2018-11-05 ENCOUNTER — Ambulatory Visit (HOSPITAL_COMMUNITY)
Admission: RE | Admit: 2018-11-05 | Discharge: 2018-11-05 | Disposition: A | Discharge status: Home / Self Care | Payer: Medicare Other | Source: Ambulatory Visit | Attending: Nurse Practitioner | Admitting: Nurse Practitioner

## 2018-11-05 DIAGNOSIS — R1310 Dysphagia, unspecified: Secondary | ICD-10-CM | POA: Diagnosis not present

## 2018-11-05 DIAGNOSIS — K449 Diaphragmatic hernia without obstruction or gangrene: Secondary | ICD-10-CM | POA: Diagnosis not present

## 2018-11-06 ENCOUNTER — Telehealth: Payer: Self-pay | Admitting: Nurse Practitioner

## 2018-11-06 DIAGNOSIS — L309 Dermatitis, unspecified: Secondary | ICD-10-CM | POA: Diagnosis not present

## 2018-11-06 NOTE — Telephone Encounter (Signed)
Pt call back for results

## 2018-11-06 NOTE — Telephone Encounter (Signed)
See the DG imaging results. Advised and scheduled for an EGD possible dilation.

## 2018-11-07 ENCOUNTER — Ambulatory Visit (AMBULATORY_SURGERY_CENTER): Payer: Self-pay

## 2018-11-07 ENCOUNTER — Other Ambulatory Visit: Payer: Self-pay

## 2018-11-07 VITALS — Ht 69.0 in | Wt 175.8 lb

## 2018-11-07 DIAGNOSIS — R1310 Dysphagia, unspecified: Secondary | ICD-10-CM

## 2018-11-07 NOTE — Progress Notes (Signed)
No egg or soy allergy known to patient  No issues with past sedation with any surgeries  or procedures, no intubation problems  No diet pills per patient No home 02 use per patient  No blood thinners per patient  Pt denies issues with constipation  No A fib or A flutter  EMMI video sent to pt's e mail , pt declined    

## 2018-11-21 ENCOUNTER — Encounter: Payer: Self-pay | Admitting: Gastroenterology

## 2018-11-21 ENCOUNTER — Ambulatory Visit (AMBULATORY_SURGERY_CENTER): Payer: Medicare Other | Admitting: Gastroenterology

## 2018-11-21 VITALS — BP 161/86 | HR 63 | Temp 97.5°F | Resp 12 | Ht 69.0 in | Wt 175.0 lb

## 2018-11-21 DIAGNOSIS — K2980 Duodenitis without bleeding: Secondary | ICD-10-CM

## 2018-11-21 DIAGNOSIS — K2950 Unspecified chronic gastritis without bleeding: Secondary | ICD-10-CM

## 2018-11-21 DIAGNOSIS — R933 Abnormal findings on diagnostic imaging of other parts of digestive tract: Secondary | ICD-10-CM

## 2018-11-21 DIAGNOSIS — K2100 Gastro-esophageal reflux disease with esophagitis, without bleeding: Secondary | ICD-10-CM

## 2018-11-21 DIAGNOSIS — K449 Diaphragmatic hernia without obstruction or gangrene: Secondary | ICD-10-CM

## 2018-11-21 DIAGNOSIS — R1310 Dysphagia, unspecified: Secondary | ICD-10-CM | POA: Diagnosis not present

## 2018-11-21 DIAGNOSIS — K2970 Gastritis, unspecified, without bleeding: Secondary | ICD-10-CM

## 2018-11-21 DIAGNOSIS — I2510 Atherosclerotic heart disease of native coronary artery without angina pectoris: Secondary | ICD-10-CM | POA: Diagnosis not present

## 2018-11-21 DIAGNOSIS — E119 Type 2 diabetes mellitus without complications: Secondary | ICD-10-CM | POA: Diagnosis not present

## 2018-11-21 DIAGNOSIS — K222 Esophageal obstruction: Secondary | ICD-10-CM

## 2018-11-21 DIAGNOSIS — I10 Essential (primary) hypertension: Secondary | ICD-10-CM | POA: Diagnosis not present

## 2018-11-21 MED ORDER — SODIUM CHLORIDE 0.9 % IV SOLN: 500.0000 mL | Freq: Once | INTRAVENOUS | Status: DC

## 2018-11-21 MED ORDER — PANTOPRAZOLE SODIUM 40 MG PO TBEC: 40.0000 mg | DELAYED_RELEASE_TABLET | Freq: Every day | ORAL | 4 refills | Status: DC

## 2018-11-21 NOTE — Patient Instructions (Signed)
Information on hiatial hernia and dilation diet given to you today.  Await pathology results.  Clear liquid diet for two hours (until 12:15pm) then soft diet for the remainder of the diet.  Resume regular diet tomorrow.  Return to GI office in 6 weeks.  Protonix 40 mg by mouth every day with refills for a year.  YOU HAD AN ENDOSCOPIC PROCEDURE TODAY AT [facility]:   Refer to the procedure report that was given to you for any specific questions about what was found during the examination.  If the procedure report does not answer your questions, please call your gastroenterologist to clarify.  If you requested that your care partner not be given the details of your procedure findings, then the procedure report has been included in a sealed envelope for you to review at your convenience later.  YOU SHOULD EXPECT: Some feelings of bloating in the abdomen. Passage of more gas than usual.  Walking can help get rid of the air that was put into your GI tract during the procedure and reduce the bloating. If you had a lower endoscopy (such as a colonoscopy or flexible sigmoidoscopy) you may notice spotting of blood in your stool or on the toilet paper. If you underwent a bowel prep for your procedure, you may not have a normal bowel movement for a few days.  Please Note:  You might notice some irritation and congestion in your nose or some drainage.  This is from the oxygen used during your procedure.  There is no need for concern and it should clear up in a day or so.  SYMPTOMS TO REPORT IMMEDIATELY:    Following upper endoscopy (EGD)  Vomiting of blood or coffee ground material  New chest pain or pain under the shoulder blades  Painful or persistently difficult swallowing  New shortness of breath  Fever of 100F or higher  Black, tarry-looking stools  For urgent or emergent issues, a gastroenterologist can be reached at any hour by calling [phone].   DIET:  We do  recommend a small meal at first, but then you may proceed to your regular diet.  Drink plenty of fluids but you should avoid alcoholic beverages for 24 hours.  ACTIVITY:  You should plan to take it easy for the rest of today and you should NOT DRIVE or use heavy machinery until tomorrow (because of the sedation medicines used during the test).    FOLLOW UP: Our staff will call the number listed on your records the next business day following your procedure to check on you and address any questions or concerns that you may have regarding the information given to you following your procedure. If we do not reach you, we will leave a message.  However, if you are feeling well and you are not experiencing any problems, there is no need to return our call.  We will assume that you have returned to your regular daily activities without incident.  If any biopsies were taken you will be contacted by phone or by letter within the next 1-3 weeks.  Please call us at [phone] if you have not heard about the biopsies in 3 weeks.    SIGNATURES/CONFIDENTIALITY: You and/or your care partner have signed paperwork which will be entered into your electronic medical record.  These signatures attest to the fact that that the information above on your After Visit Summary has been reviewed and is understood.  Full responsibility of the confidentiality of  this discharge information lies with you and/or your care-partner.

## 2018-11-21 NOTE — Progress Notes (Signed)
1005 Pt experienced bronchospasm / laryngeal spasm, jaw thrust performed.  O2 sat down to mid 80s with return to >90. vss  l

## 2018-11-21 NOTE — Progress Notes (Signed)
Pt's states no medical or surgical changes since previsit or office visit.  Migraine last pm was 3/10.  Used nerve stimulator and it stopped migraine.

## 2018-11-21 NOTE — Op Note (Signed)
[facility] Patient Name: [name] Procedure Date: 11/21/2018 9:46 AM MRN: [id number] Endoscopist: Ladene Artist , MD Age: 73 Referring MD:  Date of Birth: Nov 21, 1945 Gender: Male Account #: [id number] Procedure:                Upper GI endoscopy Indications:              Dysphagia, Abnormal barium esophagram Procedure:                Pre-Anesthesia Assessment:                           - Prior to the procedure, a History and Physical                            was performed, and patient medications and                            allergies were reviewed. The patient's tolerance of                            previous anesthesia was also reviewed. The risks                            and benefits of the procedure and the sedation                            options and risks were discussed with the patient.                            All questions were answered, and informed consent                            was obtained. Prior Anticoagulants: The patient has                            taken no previous anticoagulant or antiplatelet                            agents. ASA Grade Assessment: II - A patient with                            mild systemic disease. After reviewing the risks                            and benefits, the patient was deemed in                            satisfactory condition to undergo the procedure.                           After obtaining informed consent, the endoscope was                            passed under direct vision. Throughout the  procedure, the patient's blood pressure, pulse, and                            oxygen saturations were monitored continuously. The                            Endoscope was introduced through the mouth, and                            advanced to the second part of duodenum. The upper                            GI endoscopy was accomplished without difficulty.                             The patient tolerated the procedure well. Scope In: Scope Out: Findings:                 LA Grade A (one or more mucosal breaks less than 5                            mm, not extending between tops of 2 mucosal folds)                            esophagitis with no bleeding was found at the                            gastroesophageal junction.                           One benign-appearing, intrinsic mild stenosis was                            found 38 cm from the incisors. This stenosis                            measured 1.3 cm (inner diameter). The stenosis was                            traversed. A guidewire was placed and the scope was                            withdrawn. Dilations were performed with Savary                            dilators with mild resistance and no heme at 14 mm,                            15 mm and 16 mm.                           The exam of the esophagus was otherwise normal.  Multiple dispersed, small non-bleeding erosions                            were found in the gastric body and in the gastric                            antrum. There were no stigmata of recent bleeding.                            Biopsies were taken with a cold forceps for                            histology.                           A medium-sized hiatal hernia was present.                           The exam of the stomach was otherwise normal.                           Multiple diffuse erosions without bleeding were                            found in the duodenal bulb and in the second                            portion of the duodenum. Complications:            No immediate complications. Estimated Blood Loss:     Estimated blood loss was minimal. Impression:               - LA Grade A reflux esophagitis.                           - Benign-appearing esophageal stenosis. Dilated.                           - Non-bleeding erosive gastropathy.  Biopsied.                           - Medium-sized hiatal hernia.                           - Erosive duodenitis. Recommendation:           - Patient has a contact number available for                            emergencies. The signs and symptoms of potential                            delayed complications were discussed with the                            patient. Return to normal activities tomorrow.  Written discharge instructions were provided to the                            patient.                           - Clear liquid diet for 2 hours, then advance as                            tolerated to soft diet today.                           - Resume prior diet tomorrow.                           - Antireflux measures long term.                           - Continue present medications.                           - Await pathology results.                           - Protonix (pantoprazole) 40 mg PO daily                            indefinitely, 1 year of refills.                           - Return to GI office in 6 weeks. Ladene Artist, MD 11/21/2018 10:12:39 AM This report has been signed electronically.

## 2018-11-21 NOTE — Progress Notes (Signed)
Albuterol neb given due to pt O2 sat 91 to 92%, wheezing noted. Report given to PACU, vss

## 2018-11-21 NOTE — Progress Notes (Signed)
Please call if any questions or concerns.Called to room to assist during endoscopic procedure.  Patient ID and intended procedure confirmed with present staff. Received instructions for my participation in the procedure from the performing physician.

## 2018-11-22 ENCOUNTER — Other Ambulatory Visit: Payer: Self-pay | Admitting: Cardiology

## 2018-11-22 ENCOUNTER — Telehealth: Payer: Self-pay | Admitting: *Deleted

## 2018-11-22 NOTE — Telephone Encounter (Signed)
First follow up call attempt.  Reached patient's wife who states patient had not been feeling well after yesterday's procedure.  However he was still asleep this morning.  She will have him call us if there are concerns.  Otherwise we will attempt to call again this afternoon.

## 2018-11-22 NOTE — Telephone Encounter (Signed)
  Follow up Call-  Call back number 11/21/2018  Post procedure Call Back phone  # [phone]  Permission to leave phone message Yes  Some recent data might be hidden     Patient questions:  Do you have a fever, pain , or abdominal swelling? No. Pain Score  0   Have you tolerated food without any problems? Yes  Have you been able to return to your normal activities? Yes.    Do you have any questions about your discharge instructions: Diet   No. Medications  No. Follow up visit  No.  Do you have questions or concerns about your Care? No.  Actions: * If pain score is 4 or above: No action needed, pain <4.

## 2018-11-23 NOTE — Progress Notes (Signed)
HPI: FU CAD with prior PCI of his LAD in May 2003. His last cardiac catheterization was performed on December 16, 2008 by Dr. Olevia Perches for exertional chest pain. He was found to have a 30-40% LAD just distal to the stent. There was no other obstructive disease noted. The ejection fraction is 50%. He has been treated medically. Abdominal ultrasound in Oct 2011 showed no aneurysm. Nuclear study in January of 2014 showed an ejection fraction of 61%. There was no ischemia. Since I last saw him,he has chest tightness with more vigorous activities such as walking up 2 flights of stairs and up hills.  Relieved with rest.  No symptoms with routine activities.  Some dyspnea on exertion but no orthopnea, PND or pedal edema.  No syncope.  Current Outpatient Medications  Medication Sig Dispense Refill  . aspirin 81 MG tablet Take 81 mg by mouth daily.      Marland Kitchen atorvastatin (LIPITOR) 40 MG tablet TAKE 1 TABLET(40 MG) BY MOUTH DAILY 30 tablet 0  . azelastine (ASTELIN) 0.1 % nasal spray Place 1 spray into both nostrils 2 (two) times daily. Use in each nostril as directed (Patient taking differently: Place 1 spray into both nostrils daily. Use in each nostril as directed) 30 mL 12  . Biotin 1000 MCG tablet Take 1,000 mcg by mouth daily.     . Cinnamon 500 MG capsule Take 1,000 mg by mouth 2 (two) times daily.     . Coenzyme Q10 (COQ-10) 100 MG capsule Take 100 mg by mouth 2 (two) times daily.     Marland Kitchen ezetimibe (ZETIA) 10 MG tablet Take 1 tablet (10 mg total) by mouth daily. 30 tablet 5  . Ginger, Zingiber officinalis, (GINGER ROOT) 550 MG CAPS Take 1 capsule by mouth 2 (two) times daily.     Marland Kitchen levothyroxine (SYNTHROID) 112 MCG tablet Take 1 tablet (112 mcg total) by mouth at bedtime. 90 tablet 1  . losartan-hydrochlorothiazide (HYZAAR) 100-12.5 MG tablet TAKE 1 TABLET BY MOUTH DAILY 30 tablet 0  . metFORMIN (GLUCOPHAGE) 500 MG tablet Take 1 tablet (500 mg total) by mouth 2 (two) times daily with a meal. 60 tablet 5    . Multiple Vitamins-Minerals (THERAGRAN-M ADVANCED 50 PLUS PO) Take by mouth.    . Nerve Stimulator (CEFALY KIT) DEVI by Does not apply route.    Marland Kitchen OVER THE COUNTER MEDICATION Migra-eeze: Take 1 gel capsule two times daily.    . pantoprazole (PROTONIX) 40 MG tablet Take 1 tablet (40 mg total) by mouth daily. 90 tablet 4  . piroxicam (FELDENE) 10 MG capsule 1-2 daily prn 200 capsule 1  . propranolol ER (INDERAL LA) 160 MG SR capsule TAKE 1 CAPSULE(160 MG) BY MOUTH DAILY 30 capsule 5  . tamsulosin (FLOMAX) 0.4 MG CAPS capsule Take 1 capsule (0.4 mg total) by mouth daily. Reported on 09/23/2015 30 capsule   . Turmeric 500 MG CAPS Take 1 capsule by mouth daily.     No current facility-administered medications for this visit.      Past Medical History:  Diagnosis Date  . Allergy   . Arthritis   . CAD (coronary artery disease)    s/p PTCI in 2003 and 2004 to the LAD.   Marland Kitchen Cataract   . Chronic tubotympanic suppurative otitis media, bilateral   . Depression   . Diabetes mellitus   . Eustachian tube dysfunction   . History of placement of ear tubes   . Hyperlipidemia   .  Hypertension   . Hypothyroid   . Internal hemorrhoids   . Migraine headache   . Skin cancer, basal cell   . Thrombocytopenia ([place])   . Thrombocytopenia, unspecified ([place]) 05/08/2013  . TIA (transient ischemic attack) 2006   per patient's report.  He was never officially given this diagnosis    Past Surgical History:  Procedure Laterality Date  . COLONOSCOPY  2011  . coronary artery disease status post placement     of drug-eluting stent in the LAD in 2003,eEF 65% then  . fatty tissue removed     from neck 2003  . HERNIA REPAIR  2007  . POLYPECTOMY  2011   +TA  . right toe bone spur surgery    . THYROIDECTOMY, PARTIAL  07/2002   right thyroid  . TONSILLECTOMY      Social History   Socioeconomic History  . Marital status: Married    Spouse name: Not on file  . Number of children: 0  . Years of  education: Not on file  . Highest education level: Not on file  Occupational History  . Occupation: retired    Fish farm manager: RETIRED    Comment: accountant  Social Needs  . Financial resource strain: Not on file  . Food insecurity:    Worry: Not on file    Inability: Not on file  . Transportation needs:    Medical: Not on file    Non-medical: Not on file  Tobacco Use  . Smoking status: Former Smoker    Years: 5.00    Types: Cigarettes    Last attempt to quit: 10/10/1965    Years since quitting: 53.1  . Smokeless tobacco: Never Used  Substance and Sexual Activity  . Alcohol use: No    Alcohol/week: 0.0 standard drinks    Comment: rarely  . Drug use: No  . Sexual activity: Yes    Partners: Female  Lifestyle  . Physical activity:    Days per week: Not on file    Minutes per session: Not on file  . Stress: Not on file  Relationships  . Social connections:    Talks on phone: Not on file    Gets together: Not on file    Attends religious service: Not on file    Active member of club or organization: Not on file    Attends meetings of clubs or organizations: Not on file    Relationship status: Not on file  . Intimate partner violence:    Fear of current or ex partner: Not on file    Emotionally abused: Not on file    Physically abused: Not on file    Forced sexual activity: Not on file  Other Topics Concern  . Not on file  Social History Narrative   Exercise-- treadmill, stationary bike    Family History  Problem Relation Age of Onset  . Mental illness Mother        alzheimers  . Heart disease Mother   . Heart disease Father        triple by pass  . Hypertension Brother        Prostate Cancer  . Prostate cancer Brother   . Hypertension Brother   . Hypertension Brother   . COPD Brother   . Colon cancer Neg Hx   . Colon polyps Neg Hx   . Rectal cancer Neg Hx   . Stomach cancer Neg Hx   . Esophageal cancer Neg Hx     ROS:  no fevers or chills, productive cough,  hemoptysis, dysphasia, odynophagia, melena, hematochezia, dysuria, hematuria, rash, seizure activity, orthopnea, PND, pedal edema, claudication. Remaining systems are negative.  Physical Exam: Well-developed well-nourished in no acute distress.  Skin is warm and dry.  HEENT is normal.  Neck is supple.  Chest is clear to auscultation with normal expansion.  Cardiovascular exam is regular rate and rhythm.  Abdominal exam nontender or distended. No masses palpated. Extremities show no edema. neuro grossly intact  ECG-sinus bradycardia at a rate of 59, no ST changes.  Personally reviewed  A/P  1 coronary artery disease- Continue medical therapy with aspirin and statin.  He notes some chest tightness with vigorous activities.  I will arrange a stress nuclear study for risk stratification.  We are adding amlodipine both as an antianginal and for blood pressure.  2 hypertension-patient's blood pressure is mildly elevated.  Add amlodipine 5 mg daily and follow.  3 hyperlipidemia-continue statin.  Kirk Ruths, MD

## 2018-11-30 DIAGNOSIS — H66014 Acute suppurative otitis media with spontaneous rupture of ear drum, recurrent, right ear: Secondary | ICD-10-CM | POA: Diagnosis not present

## 2018-11-30 DIAGNOSIS — H6983 Other specified disorders of Eustachian tube, bilateral: Secondary | ICD-10-CM | POA: Diagnosis not present

## 2018-11-30 DIAGNOSIS — H6523 Chronic serous otitis media, bilateral: Secondary | ICD-10-CM | POA: Diagnosis not present

## 2018-11-30 DIAGNOSIS — H903 Sensorineural hearing loss, bilateral: Secondary | ICD-10-CM | POA: Diagnosis not present

## 2018-12-07 ENCOUNTER — Encounter: Payer: Self-pay | Admitting: Cardiology

## 2018-12-07 ENCOUNTER — Ambulatory Visit (INDEPENDENT_AMBULATORY_CARE_PROVIDER_SITE_OTHER): Payer: Medicare Other | Admitting: Cardiology

## 2018-12-07 VITALS — BP 146/74 | HR 59 | Ht 69.0 in | Wt 178.0 lb

## 2018-12-07 DIAGNOSIS — L309 Dermatitis, unspecified: Secondary | ICD-10-CM | POA: Diagnosis not present

## 2018-12-07 DIAGNOSIS — E7800 Pure hypercholesterolemia, unspecified: Secondary | ICD-10-CM | POA: Diagnosis not present

## 2018-12-07 DIAGNOSIS — I2510 Atherosclerotic heart disease of native coronary artery without angina pectoris: Secondary | ICD-10-CM | POA: Diagnosis not present

## 2018-12-07 DIAGNOSIS — Z85828 Personal history of other malignant neoplasm of skin: Secondary | ICD-10-CM | POA: Diagnosis not present

## 2018-12-07 DIAGNOSIS — R072 Precordial pain: Secondary | ICD-10-CM

## 2018-12-07 DIAGNOSIS — I10 Essential (primary) hypertension: Secondary | ICD-10-CM | POA: Diagnosis not present

## 2018-12-07 DIAGNOSIS — L720 Epidermal cyst: Secondary | ICD-10-CM | POA: Diagnosis not present

## 2018-12-07 DIAGNOSIS — L819 Disorder of pigmentation, unspecified: Secondary | ICD-10-CM | POA: Diagnosis not present

## 2018-12-07 MED ORDER — AMLODIPINE BESYLATE 5 MG PO TABS: 5.0000 mg | ORAL_TABLET | Freq: Every day | ORAL | 3 refills | Status: DC

## 2018-12-07 NOTE — Patient Instructions (Signed)
Medication Instructions:  START AMLODIPINE 5 MG ONCE DAILY If you need a refill on your cardiac medications before your next appointment, please call your pharmacy.   Lab work: If you have labs (blood work) drawn today and your tests are completely normal, you will receive your results only by: Marland Kitchen MyChart Message (if you have MyChart) OR . A paper copy in the mail If you have any lab test that is abnormal or we need to change your treatment, we will call you to review the results.  Testing/Procedures: Your physician has requested that you have en exercise stress myoview. For further information please visit [email or web address]. Please follow instruction sheet, as given.TAKE ALL MEDICATIONS PER MD    Follow-Up: At [facility], you and your health needs are our priority.  As part of our continuing mission to provide you with exceptional heart care, we have created designated Provider Care Teams.  These Care Teams include your primary Cardiologist (physician) and Advanced Practice Providers (APPs -  Physician Assistants and Nurse Practitioners) who all work together to provide you with the care you need, when you need it. Your physician recommends that you schedule a follow-up appointment in: [place]

## 2018-12-11 ENCOUNTER — Telehealth (HOSPITAL_COMMUNITY): Payer: Self-pay

## 2018-12-11 NOTE — Telephone Encounter (Signed)
Encounter complete. 

## 2018-12-12 ENCOUNTER — Ambulatory Visit (HOSPITAL_COMMUNITY)
Admission: RE | Admit: 2018-12-12 | Discharge: 2018-12-12 | Disposition: A | Discharge status: Home / Self Care | Payer: Medicare Other | Source: Ambulatory Visit | Attending: Cardiovascular Disease | Admitting: Cardiovascular Disease

## 2018-12-12 ENCOUNTER — Encounter (HOSPITAL_COMMUNITY): Payer: Medicare Other

## 2018-12-12 ENCOUNTER — Encounter (HOSPITAL_COMMUNITY): Payer: Self-pay

## 2018-12-12 DIAGNOSIS — R072 Precordial pain: Secondary | ICD-10-CM | POA: Insufficient documentation

## 2018-12-12 LAB — MYOCARDIAL PERFUSION IMAGING
CHL CUP NUCLEAR SSS: 4
CSEPEDS: 31 s
CSEPEW: 10.4 METS
Exercise duration (min): 8 min
LV dias vol: 96 mL (ref 62–150)
LV sys vol: 42 mL
MPHR: 146 {beats}/min
Peak HR: 141 {beats}/min
Percent HR: 96 %
RPE: 19
Rest HR: 59 {beats}/min
SDS: 4
SRS: 0
TID: 1

## 2018-12-12 MED ORDER — TECHNETIUM TC 99M TETROFOSMIN IV KIT
32.3000 | PACK | Freq: Once | INTRAVENOUS | Status: AC | PRN
  Administered 2018-12-12: 32.3 via INTRAVENOUS
  Filled 2018-12-12: qty 33

## 2018-12-12 MED ORDER — TECHNETIUM TC 99M TETROFOSMIN IV KIT
10.9000 | PACK | Freq: Once | INTRAVENOUS | Status: AC | PRN
  Administered 2018-12-12: 10.9 via INTRAVENOUS
  Filled 2018-12-12: qty 11

## 2018-12-12 NOTE — Progress Notes (Signed)
MPI shown to Dr. Claiborne Billings before patient departing.

## 2018-12-13 ENCOUNTER — Other Ambulatory Visit: Payer: Self-pay | Admitting: Cardiology

## 2018-12-13 ENCOUNTER — Encounter: Payer: Self-pay | Admitting: Gastroenterology

## 2018-12-27 DIAGNOSIS — H7013 Chronic mastoiditis, bilateral: Secondary | ICD-10-CM | POA: Diagnosis not present

## 2018-12-27 DIAGNOSIS — H906 Mixed conductive and sensorineural hearing loss, bilateral: Secondary | ICD-10-CM | POA: Diagnosis not present

## 2018-12-27 DIAGNOSIS — H6612 Chronic tubotympanic suppurative otitis media, left ear: Secondary | ICD-10-CM | POA: Diagnosis not present

## 2018-12-27 DIAGNOSIS — H6983 Other specified disorders of Eustachian tube, bilateral: Secondary | ICD-10-CM | POA: Diagnosis not present

## 2018-12-27 DIAGNOSIS — E119 Type 2 diabetes mellitus without complications: Secondary | ICD-10-CM | POA: Diagnosis not present

## 2018-12-27 DIAGNOSIS — H7202 Central perforation of tympanic membrane, left ear: Secondary | ICD-10-CM | POA: Diagnosis not present

## 2018-12-27 DIAGNOSIS — H60392 Other infective otitis externa, left ear: Secondary | ICD-10-CM | POA: Diagnosis not present

## 2019-01-03 ENCOUNTER — Other Ambulatory Visit: Payer: Self-pay | Admitting: Otolaryngology

## 2019-01-03 DIAGNOSIS — H7013 Chronic mastoiditis, bilateral: Secondary | ICD-10-CM

## 2019-01-03 DIAGNOSIS — H7202 Central perforation of tympanic membrane, left ear: Secondary | ICD-10-CM

## 2019-01-04 ENCOUNTER — Other Ambulatory Visit: Payer: Self-pay

## 2019-01-04 ENCOUNTER — Telehealth: Payer: Self-pay

## 2019-01-04 ENCOUNTER — Ambulatory Visit
Admission: RE | Admit: 2019-01-04 | Discharge: 2019-01-04 | Disposition: A | Discharge status: Home / Self Care | Payer: Medicare Other | Source: Ambulatory Visit | Attending: Otolaryngology | Admitting: Otolaryngology

## 2019-01-04 DIAGNOSIS — H7202 Central perforation of tympanic membrane, left ear: Secondary | ICD-10-CM

## 2019-01-04 DIAGNOSIS — H7013 Chronic mastoiditis, bilateral: Secondary | ICD-10-CM

## 2019-01-04 MED ORDER — IOPAMIDOL (ISOVUE-300) INJECTION 61%
60.0000 mL | Freq: Once | INTRAVENOUS | Status: AC | PRN
  Administered 2019-01-04: 60 mL via INTRAVENOUS

## 2019-01-04 NOTE — Telephone Encounter (Signed)
Spoke with patient and offered Webex or Zoom appt for appt scheduled on 01/07/19. Also asked how his dyspahgia is doing. Patient states he is still having trouble swallowing pills. He declined Webex and Zoom appt at this time and will come in and see Dr. Fuller Plan per him on May.

## 2019-01-07 ENCOUNTER — Ambulatory Visit: Payer: Medicare Other | Admitting: Gastroenterology

## 2019-01-22 DIAGNOSIS — H6983 Other specified disorders of Eustachian tube, bilateral: Secondary | ICD-10-CM | POA: Diagnosis not present

## 2019-01-22 DIAGNOSIS — H7202 Central perforation of tympanic membrane, left ear: Secondary | ICD-10-CM | POA: Diagnosis not present

## 2019-01-22 DIAGNOSIS — E119 Type 2 diabetes mellitus without complications: Secondary | ICD-10-CM | POA: Diagnosis not present

## 2019-01-22 DIAGNOSIS — H906 Mixed conductive and sensorineural hearing loss, bilateral: Secondary | ICD-10-CM | POA: Diagnosis not present

## 2019-01-22 DIAGNOSIS — H7013 Chronic mastoiditis, bilateral: Secondary | ICD-10-CM | POA: Diagnosis not present

## 2019-01-28 NOTE — Progress Notes (Signed)
Virtual Visit via Video Note The purpose of this virtual visit is to provide medical care while limiting exposure to the novel coronavirus.    Consent was obtained for video visit:  Yes Answered questions that patient had about telehealth interaction:  Yes I discussed the limitations, risks, security and privacy concerns of performing an evaluation and management service by telemedicine. I also discussed with the patient that there may be a patient responsible charge related to this service. The patient expressed understanding and agreed to proceed.  Pt location: Home Physician Location: office Name of referring provider:  Ann Carney, * I connected with [name] at patients initiation/request on 01/29/2019 at 11:00 AM EDT by video enabled telemedicine application and verified that I am speaking with the correct person using two identifiers. Pt MRN:  [id number] Pt DOB:  Apr 29, 1946 Video Participants:  [name]; His wife   History of Present Illness:  [name] is a 73 year old right-handed male with osteoarthritis, hypertension, type 2 diabetes mellitus and hyperlipidemia who follows up for migraine and essential tremor.    UPDATE: I  Migraine:  Last visit, he expressed interest in [place].  I provided a prescription for him and he started it in November. Intensity:  Moderate to severe Duration: If uses Cephaly immediately at onset of aura and lays down for an hour, prevents onset of headache Frequency:  Sometimes he has no migraines a month but may also occur 2 days a month.  In February, it occurred 4 days a month.  No headaches so far in April. Frequency of abortive medication: infrequent Rescue protocol:  Ginger tea with Tylenol Current NSAIDS:  piroxiicam daily (for arthritis) Current analgesics:  no Current triptans:  no Current ergotamine:  no Current anti-emetic:  no Current muscle relaxants:  no Current anti-anxiolytic:  no Current sleep aide:  no  Current Antihypertensive medications:  Losartan-HCTZ, propranolol ER 151m Current Antidepressant medications:  no Current Anticonvulsant medications:  no Current anti-CGRP:  no Current Vitamins/Herbal/Supplements: Ginger tea, biotin, multivitamin, fish oil, coenzyme Q 10, cinnamon, gingerroot, turmeric 500 mg, magnesium 250 mg, riboflavin 400 mg, butterbur 150 mg Current Antihistamines/Decongestants: Flonase Other therapy: Cephaly.  Treat acutely with ginger tea and rest.  Caffeine: 2 to 3 cups of coffee daily Alcohol: No Smoker: No Diet: Hydrates.  No soda. Exercise: Yes Depression: Stable; Anxiety: Stable Other pain:  no Sleep hygiene: He wakes up every hour to urinate  II Essential Tremor:  He is taking propranolol ER 160 mg daily.  Tremors are stable  HISTORY: I Migraine: Onset:  In his 24sor 30s. They would occur once or twice a year. In September, they started to occur once or twice a week and then almost daily. Over the past couple of weeks (following tympanostomy tube placement), they have occurred about once a week. Location: Top of head Quality: explode Initial Intensity: 10/10 Aura: Flashing lights/colors in vision Prodrome: no Postdrome:  Hangover effect for up to 5 days Associated symptoms:  Nausea, photophobia, phonophobia. Denies unilateral numbness and weakness. Initial Duration: Several hours Initial Frequency: In September, they started to occur once or twice a week and then almost daily. Over the past couple of weeks (following tympanostomy tube placement), they have occurred about once a week. Triggers:  Emotional stress, sound, NTG, light, wine, eye strain Relieving factors:  Sleep Activity: Needs to lay down  Past NSAIDS: Celebrex Past analgesics: no Past abortive triptans: Sumatriptan (stopped due to CAD). Past muscle relaxants: no  Past anti-emetic: no Past antihypertensive medications: Propranolol (many years ago) Past  antidepressant medications: Prozac, amitriptyline Past anticonvulsant medications: no Past vitamins/Herbal/Supplements: no Other past therapies: Biofeedback, relaxation therapy  MRI of brain without contrast from 08/11/16 was personally reviewed and revealed mild chronic small vessel ischemic changes but no acute stroke, bleed or mass lesion. However, it did reveal mild mastoiditis. He saw ENT and had tympanostomy tube placement on 09/22/16.   II  Essential Tremor: For over 10 years, he has had a tremor, particularly in his right hand. It has become more noticeable. 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 There is no known family history.  He finds it difficult to swallow the propranolol and wonders if there is a coated version.  Past Medical History: Past Medical History:  Diagnosis Date  . Allergy   . Arthritis   . CAD (coronary artery disease)    s/p PTCI in 2003 and 2004 to the LAD.   Marland Kitchen Cataract   . Chronic tubotympanic suppurative otitis media, bilateral   . Depression   . Diabetes mellitus   . Eustachian tube dysfunction   . History of placement of ear tubes   . Hyperlipidemia   . Hypertension   . Hypothyroid   . Internal hemorrhoids   . Migraine headache   . Skin cancer, basal cell   . Thrombocytopenia ([place])   . Thrombocytopenia, unspecified ([place]) 05/08/2013  . TIA (transient ischemic attack) 2006   per patient's report.  He was never officially given this diagnosis    Medications: Outpatient Encounter Medications as of 01/29/2019  Medication Sig  . amLODipine (NORVASC) 5 MG tablet Take 1 tablet (5 mg total) by mouth daily.  Marland Kitchen aspirin 81 MG tablet Take 81 mg by mouth daily.    Marland Kitchen atorvastatin (LIPITOR) 40 MG tablet TAKE 1 TABLET(40 MG) BY MOUTH DAILY  . azelastine (ASTELIN) 0.1 % nasal spray Place 1 spray into both nostrils 2 (two) times daily. Use in each nostril as directed (Patient taking differently: Place 1 spray into both  nostrils daily. Use in each nostril as directed)  . Biotin 1000 MCG tablet Take 1,000 mcg by mouth daily.   . Cinnamon 500 MG capsule Take 1,000 mg by mouth 2 (two) times daily.   . Coenzyme Q10 (COQ-10) 100 MG capsule Take 100 mg by mouth 2 (two) times daily.   Marland Kitchen ezetimibe (ZETIA) 10 MG tablet Take 1 tablet (10 mg total) by mouth daily.  . Ginger, Zingiber officinalis, (GINGER ROOT) 550 MG CAPS Take 1 capsule by mouth 2 (two) times daily.   Marland Kitchen levothyroxine (SYNTHROID) 112 MCG tablet Take 1 tablet (112 mcg total) by mouth at bedtime.  Marland Kitchen losartan-hydrochlorothiazide (HYZAAR) 100-12.5 MG tablet TAKE 1 TABLET BY MOUTH DAILY  . metFORMIN (GLUCOPHAGE) 500 MG tablet Take 1 tablet (500 mg total) by mouth 2 (two) times daily with a meal.  . Multiple Vitamins-Minerals (THERAGRAN-M ADVANCED 50 PLUS PO) Take by mouth.  . Nerve Stimulator (CEFALY KIT) DEVI by Does not apply route.  Marland Kitchen OVER THE COUNTER MEDICATION Migra-eeze: Take 1 gel capsule two times daily.  . pantoprazole (PROTONIX) 40 MG tablet Take 1 tablet (40 mg total) by mouth daily.  . piroxicam (FELDENE) 10 MG capsule 1-2 daily prn  . propranolol ER (INDERAL LA) 160 MG SR capsule TAKE 1 CAPSULE(160 MG) BY MOUTH DAILY  . tamsulosin (FLOMAX) 0.4 MG CAPS capsule Take 1 capsule (0.4 mg total) by mouth daily. Reported on  09/23/2015  . Turmeric 500 MG CAPS Take 1 capsule by mouth daily.   No facility-administered encounter medications on file as of 01/29/2019.     Allergies: Allergies  Allergen Reactions  . Sulfa Antibiotics Other (See Comments)  . Sulfamethoxazole-Trimethoprim Nausea And Vomiting  . Sulfasalazine Other (See Comments)  . Codeine   . Crestor [Rosuvastatin Calcium]   . Hydrocodone-Acetaminophen     vicodin   . Other     Apples and tomatoes...... Nausea and migraines.    . Rosuvastatin     REACTION: REALLY BAD JOINT/MUSCLE PAIN    Family History: Family History  Problem Relation Age of Onset  . Mental illness Mother         alzheimers  . Heart disease Mother   . Heart disease Father        triple by pass  . Hypertension Brother        Prostate Cancer  . Prostate cancer Brother   . Hypertension Brother   . Hypertension Brother   . COPD Brother   . Colon cancer Neg Hx   . Colon polyps Neg Hx   . Rectal cancer Neg Hx   . Stomach cancer Neg Hx   . Esophageal cancer Neg Hx     Social History: Social History   Socioeconomic History  . Marital status: Married    Spouse name: Not on file  . Number of children: 0  . Years of education: Not on file  . Highest education level: Not on file  Occupational History  . Occupation: retired    Fish farm manager: RETIRED    Comment: accountant  Social Needs  . Financial resource strain: Not on file  . Food insecurity:    Worry: Not on file    Inability: Not on file  . Transportation needs:    Medical: Not on file    Non-medical: Not on file  Tobacco Use  . Smoking status: Former Smoker    Years: 5.00    Types: Cigarettes    Last attempt to quit: 10/10/1965    Years since quitting: 53.3  . Smokeless tobacco: Never Used  Substance and Sexual Activity  . Alcohol use: No    Alcohol/week: 0.0 standard drinks    Comment: rarely  . Drug use: No  . Sexual activity: Yes    Partners: Female  Lifestyle  . Physical activity:    Days per week: Not on file    Minutes per session: Not on file  . Stress: Not on file  Relationships  . Social connections:    Talks on phone: Not on file    Gets together: Not on file    Attends religious service: Not on file    Active member of club or organization: Not on file    Attends meetings of clubs or organizations: Not on file    Relationship status: Not on file  . Intimate partner violence:    Fear of current or ex partner: Not on file    Emotionally abused: Not on file    Physically abused: Not on file    Forced sexual activity: Not on file  Other Topics Concern  . Not on file  Social History Narrative   Exercise--  treadmill, stationary bike   Observations/Objective:   Blood pressure 98/65, pulse 60, temperature (!) 97 F (36.1 C), height '5\' 9"'  (1.753 m), weight 185 lb (83.9 kg), SpO2 99 %. Alert and oriented.  Speech fluent and not dysarthric.  Language intact.  Eyes orthophoric and move in all directions.  Face symmetric    Assessment and Plan:   1.  Migraine with aura, without status migrianosus, not intractable, stable 2.  Essential tremor, stable  1.  Cefaly device for preventative and abortive migraine therapy 2.  Propranolol ER 19m daily as per cardiologist for blood pressure and essential tremor (and migraine as well) 3.  Limit use of pain relievers to no more than 2 days out of week to prevent risk of rebound or medication-overuse headache. 4.  Headache diary 5.  Follow up in 6 months.  Follow Up Instructions:    -I discussed the assessment and treatment plan with the patient. The patient was provided an opportunity to ask questions and all were answered. The patient agreed with the plan and demonstrated an understanding of the instructions.   The patient was advised to call back or seek an in-person evaluation if the symptoms worsen or if the condition fails to improve as anticipated.  Amount of time spent with patient:  15 minutes.   ADudley Major DO

## 2019-01-29 ENCOUNTER — Telehealth (INDEPENDENT_AMBULATORY_CARE_PROVIDER_SITE_OTHER): Payer: Medicare Other | Admitting: Neurology

## 2019-01-29 ENCOUNTER — Encounter: Payer: Self-pay | Admitting: Neurology

## 2019-01-29 ENCOUNTER — Other Ambulatory Visit: Payer: Self-pay

## 2019-01-29 VITALS — BP 98/65 | HR 60 | Temp 97.0°F | Ht 69.0 in | Wt 185.0 lb

## 2019-01-29 DIAGNOSIS — G43109 Migraine with aura, not intractable, without status migrainosus: Secondary | ICD-10-CM | POA: Diagnosis not present

## 2019-01-29 DIAGNOSIS — G250 Essential tremor: Secondary | ICD-10-CM

## 2019-01-29 NOTE — Patient Instructions (Signed)
Continue Cefaly for migraines Continue propranolol ER 160mg  daily (for blood pressure, tremor and migraines) Follow up in 6 months.

## 2019-02-01 NOTE — Progress Notes (Signed)
Virtual Visit via Video Note changed to telephone visit at patient request due to technical difficulties with his smart phone   This visit type was conducted due to national recommendations for restrictions regarding the COVID-19 Pandemic (e.g. social distancing) in an effort to limit this patient's exposure and mitigate transmission in our community.  Due to his co-morbid illnesses, this patient is at least at moderate risk for complications without adequate follow up.  This format is felt to be most appropriate for this patient at this time.  All issues noted in this document were discussed and addressed.  A limited physical exam was performed with this format.  Please refer to the patient's chart for his consent to telehealth for [facility].   Evaluation Performed:  Follow-up visit  Date:  02/08/2019   ID:  Kaidan, Harpster 1945-11-03, MRN [id number]  Patient Location: Home Provider Location: Home  PCP:  [name], DO  Cardiologist:  Dr Stanford Breed  Chief Complaint:  FU CAD  History of Present Illness:    FU CAD with prior PCI of his LAD in May 2003. His last cardiac catheterization was performed on December 16, 2008 by Dr. Olevia Perches for exertional chest pain. He was found to have a 30-40% LAD just distal to the stent. There was no other obstructive disease noted. The ejection fraction is 50%. He has been treated medically. Abdominal ultrasound in Oct 2011 showed no aneurysm. Nuclear study in March 2020 showed ejection fraction 56%.  Patient had significant ST changes but perfusion was normal.  Since I last saw him,patient has dyspnea with more vigorous activities.  He does not have dyspnea with routine activities.  No orthopnea or PND.  Mild pedal edema since initiating amlodipine.  No chest pain.  The patient does not have symptoms concerning for COVID-19 infection (fever, chills, cough, or new shortness of breath).    Past Medical History:  Diagnosis Date  . Allergy   .  Arthritis   . CAD (coronary artery disease)    s/p PTCI in 2003 and 2004 to the LAD.   Marland Kitchen Cataract   . Chronic tubotympanic suppurative otitis media, bilateral   . Depression   . Diabetes mellitus   . Eustachian tube dysfunction   . History of placement of ear tubes   . Hyperlipidemia   . Hypertension   . Hypothyroid   . Internal hemorrhoids   . Migraine headache   . Skin cancer, basal cell   . Thrombocytopenia ([place])   . Thrombocytopenia, unspecified ([place]) 05/08/2013  . TIA (transient ischemic attack) 2006   per patient's report.  He was never officially given this diagnosis   Past Surgical History:  Procedure Laterality Date  . COLONOSCOPY  2011  . coronary artery disease status post placement     of drug-eluting stent in the LAD in 2003,eEF 65% then  . esophogeal dilation    . fatty tissue removed     from neck 2003  . HERNIA REPAIR  2007  . POLYPECTOMY  2011   +TA  . right toe bone spur surgery    . THYROIDECTOMY, PARTIAL  07/2002   right thyroid  . TONSILLECTOMY    . UPPER GI ENDOSCOPY       Current Meds  Medication Sig  . amLODipine (NORVASC) 5 MG tablet Take 1 tablet (5 mg total) by mouth daily.  Marland Kitchen aspirin 81 MG tablet Take 81 mg by mouth daily.    Marland Kitchen atorvastatin (LIPITOR) 40  MG tablet TAKE 1 TABLET(40 MG) BY MOUTH DAILY  . azelastine (ASTELIN) 0.1 % nasal spray Place 1 spray into both nostrils 2 (two) times daily. Use in each nostril as directed (Patient taking differently: Place 1 spray into both nostrils daily. Use in each nostril as directed)  . Biotin 1000 MCG tablet Take 1,000 mcg by mouth daily.   . Cinnamon 500 MG capsule Take 1,000 mg by mouth 2 (two) times daily.   Marland Kitchen CIPRODEX OTIC suspension INT 3 GTS INTO BOTH EARS QHS  . Coenzyme Q10 (COQ-10) 100 MG capsule Take 1 capsule (100 mg total) by mouth daily.  Marland Kitchen ezetimibe (ZETIA) 10 MG tablet Take 1 tablet (10 mg total) by mouth daily.  . Ginger, Zingiber officinalis, (GINGER ROOT) 550 MG CAPS Take 1 capsule  by mouth 2 (two) times daily.   Marland Kitchen levothyroxine (SYNTHROID) 112 MCG tablet Take 1 tablet (112 mcg total) by mouth at bedtime.  Marland Kitchen losartan-hydrochlorothiazide (HYZAAR) 100-12.5 MG tablet TAKE 1 TABLET BY MOUTH DAILY  . metFORMIN (GLUCOPHAGE) 500 MG tablet Take 1 tablet (500 mg total) by mouth 2 (two) times daily with a meal.  . Multiple Vitamins-Minerals (THERAGRAN-M ADVANCED 50 PLUS PO) Take by mouth.  . Nerve Stimulator (CEFALY KIT) DEVI by Does not apply route.  Marland Kitchen OVER THE COUNTER MEDICATION Migra-eeze: Take 1 gel capsule two times daily.  . pantoprazole (PROTONIX) 40 MG tablet Take 1 tablet (40 mg total) by mouth daily.  . piroxicam (FELDENE) 10 MG capsule 1-2 daily prn  . propranolol ER (INDERAL LA) 160 MG SR capsule TAKE 1 CAPSULE(160 MG) BY MOUTH DAILY  . tamsulosin (FLOMAX) 0.4 MG CAPS capsule Take 1 capsule (0.4 mg total) by mouth daily. Reported on 09/23/2015  . Turmeric 500 MG CAPS Take 2 capsules by mouth 2 (two) times a day.     Allergies:   Sulfa antibiotics; Sulfamethoxazole-trimethoprim; Sulfasalazine; Codeine; Crestor [rosuvastatin calcium]; Hydrocodone-acetaminophen; Other; and Rosuvastatin   Social History   Tobacco Use  . Smoking status: Former Smoker    Years: 5.00    Types: Cigarettes    Last attempt to quit: 10/10/1965    Years since quitting: 53.3  . Smokeless tobacco: Never Used  Substance Use Topics  . Alcohol use: No    Alcohol/week: 0.0 standard drinks    Comment: wine rarely  . Drug use: No     Family Hx: The patient's family history includes COPD in his brother; Heart disease in his father and mother; Hypertension in his brother, brother, and brother; Mental illness in his mother; Prostate cancer in his brother. There is no history of Colon cancer, Colon polyps, Rectal cancer, Stomach cancer, or Esophageal cancer.  ROS:   Please see the history of present illness.    Patient denies fevers, chills or productive cough.  Pedal edema. All other systems  reviewed and are negative.  Recent Labs: 10/18/2018: ALT 17; BUN 21; Creatinine, Ser 1.29; Potassium 4.7; Sodium 144; TSH 2.23   Recent Lipid Panel Lab Results  Component Value Date/Time   CHOL 96 10/18/2018 09:37 AM   TRIG 54.0 10/18/2018 09:37 AM   HDL 51.80 10/18/2018 09:37 AM   CHOLHDL 2 10/18/2018 09:37 AM   LDLCALC 34 10/18/2018 09:37 AM   LDLCALC 37 10/09/2017 11:49 AM    Wt Readings from Last 3 Encounters:  02/08/19 185 lb (83.9 kg)  02/05/19 185 lb (83.9 kg)  01/29/19 185 lb (83.9 kg)     Objective:    Vital Signs:  BP  98/65 Comment: Unable to obtain  Pulse 63   Wt 185 lb (83.9 kg)   BMI 27.32 kg/m    VITAL SIGNS:  reviewed  No acute distress Answers questions appropriately Normal affect Remainder of physical exam not performed (telehealth visit; coronavirus pandemic)  ASSESSMENT & PLAN:    1. Coronary artery disease-plan to continue medical therapy with aspirin and statin.  Recent nuclear study showed normal perfusion but marked ST changes.  He has some dyspnea with more vigorous activities but no chest pain.  We have elected to treat medically for now.  We can consider cardiac catheterization in future if his symptoms worsen.  Add isosorbide 30 mg daily. 2. Hypertension-patient's blood pressure is controlled.  However he has developed pedal edema with amlodipine.  I will discontinue and we will add isosorbide as outlined above. 3. Hyperlipidemia-continue statin.  COVID-19 Education: The importance of social distancing was discussed today.  Time:   Today, I have spent 15 minutes with the patient with telehealth technology discussing the above problems.     Medication Adjustments/Labs and Tests Ordered: Current medicines are reviewed at length with the patient today.  Concerns regarding medicines are outlined above.   Tests Ordered: No orders of the defined types were placed in this encounter.   Medication Changes: No orders of the defined types were  placed in this encounter.   Disposition:  Follow up in 6 month(s)  Signed, Kirk Ruths, MD  02/08/2019 9:39 AM    [facility]

## 2019-02-04 ENCOUNTER — Other Ambulatory Visit: Payer: Self-pay

## 2019-02-05 ENCOUNTER — Ambulatory Visit (INDEPENDENT_AMBULATORY_CARE_PROVIDER_SITE_OTHER): Payer: Medicare Other | Admitting: Gastroenterology

## 2019-02-05 ENCOUNTER — Other Ambulatory Visit: Payer: Self-pay

## 2019-02-05 ENCOUNTER — Encounter: Payer: Self-pay | Admitting: Gastroenterology

## 2019-02-05 VITALS — Ht 69.0 in | Wt 185.0 lb

## 2019-02-05 DIAGNOSIS — I2510 Atherosclerotic heart disease of native coronary artery without angina pectoris: Secondary | ICD-10-CM | POA: Diagnosis not present

## 2019-02-05 DIAGNOSIS — R1310 Dysphagia, unspecified: Secondary | ICD-10-CM | POA: Diagnosis not present

## 2019-02-05 DIAGNOSIS — K2100 Gastro-esophageal reflux disease with esophagitis, without bleeding: Secondary | ICD-10-CM

## 2019-02-05 DIAGNOSIS — K222 Esophageal obstruction: Secondary | ICD-10-CM | POA: Diagnosis not present

## 2019-02-05 NOTE — Patient Instructions (Signed)
Continue current medications.   Call back if your symptoms get worse.

## 2019-02-05 NOTE — Progress Notes (Signed)
    History of Present Illness: This is a 73 year old male complaining of difficulty swallowing pills and some foods.  He states his solid food dysphagia improved following his EGD/dilation however his pill dysphagia has not improved.  He states he has the most difficult time with capsules.  He has noted improved swallowing when he eats a banana after taking capsules.  He and his wife had several questions about long-term management for GERD and the use and risk of PPI therapy.   EGD 11/2018 - LA Grade A reflux esophagitis. - Benign-appearing esophageal stenosis. Dilated to 16 mm. - Non-bleeding erosive gastropathy. Biopsied. - Medium-sized hiatal hernia. - Erosive duodenitis.   Current Medications, Allergies, Past Medical History, Past Surgical History, Family History and Social History were reviewed in [employer] record.   Physical Exam: Telemedicine - not performed   Assessment and Recommendations:  1.  Dysphagia, esophageal stricture.  Pill dysphagia likely is oropharyngeal or upper esophageal.  Offered repeat upper endoscopy/dilation ensure his stricture is adequately opened however he states he like to wait on this and assess his symptoms for a while longer.  He is advised to eat a banana, yogurt or applesauce with his medications or following his medications.  He is advised to call if his swallowing symptoms persist or worsen.  Consider MBSS. Consider repeat EGD/dilation.  2.  GERD with LA Class A erosive esophagitis.  Continue standard antireflux measures.  Continue pantoprazole 40 mg p.o. daily long-term.  We discussed the risks benefits and alternatives to PPI therapy and he agrees to continue. REV in 1 year.     These services were provided via telemedicine, audio only per patient request.  The patient was at home with his wife included in the conversation and the provider was in the office, alone.  We discussed the limitations of evaluation and  management by telemedicine and the availability of in person appointments.  Patient consented for this telemedicine visit and is aware of possible charges for this service.  The other person participating in the telemedicine service was Marlon Pel, CMA who reviewed medications, allergies, past history and completed AVS.  Time spent on call: 16 minutes

## 2019-02-07 ENCOUNTER — Telehealth: Payer: Self-pay | Admitting: Cardiology

## 2019-02-07 NOTE — Telephone Encounter (Signed)
Smartphone/consent/ my chart/ pre reg completed °

## 2019-02-08 ENCOUNTER — Encounter: Payer: Self-pay | Admitting: Cardiology

## 2019-02-08 ENCOUNTER — Ambulatory Visit: Payer: Medicare Other | Admitting: Gastroenterology

## 2019-02-08 ENCOUNTER — Telehealth (INDEPENDENT_AMBULATORY_CARE_PROVIDER_SITE_OTHER): Payer: Medicare Other | Admitting: Cardiology

## 2019-02-08 VITALS — BP 98/65 | HR 63 | Wt 185.0 lb

## 2019-02-08 DIAGNOSIS — I2510 Atherosclerotic heart disease of native coronary artery without angina pectoris: Secondary | ICD-10-CM

## 2019-02-08 DIAGNOSIS — E7800 Pure hypercholesterolemia, unspecified: Secondary | ICD-10-CM

## 2019-02-08 DIAGNOSIS — I10 Essential (primary) hypertension: Secondary | ICD-10-CM

## 2019-02-08 MED ORDER — ISOSORBIDE MONONITRATE ER 30 MG PO TB24: 30.0000 mg | ORAL_TABLET | Freq: Every day | ORAL | 3 refills | Status: DC

## 2019-02-08 NOTE — Patient Instructions (Signed)
Medication Instructions:  STOP AMLODIPINE  START ISOSORBIDE 30 MG ONCE DAILY- TAKE 1/2 TABLET ONCE DAILY FOR THE FIRST 3 DAYS THEN INCREASE TO ONE TABLET ONCE DAILY If you need a refill on your cardiac medications before your next appointment, please call your pharmacy.   Lab work: If you have labs (blood work) drawn today and your tests are completely normal, you will receive your results only by: Marland Kitchen MyChart Message (if you have MyChart) OR . A paper copy in the mail If you have any lab test that is abnormal or we need to change your treatment, we will call you to review the results.  Follow-Up: At [facility], you and your health needs are our priority.  As part of our continuing mission to provide you with exceptional heart care, we have created designated Provider Care Teams.  These Care Teams include your primary Cardiologist (physician) and Advanced Practice Providers (APPs -  Physician Assistants and Nurse Practitioners) who all work together to provide you with the care you need, when you need it. Your physician recommends that you schedule a follow-up appointment in: [place]

## 2019-02-08 NOTE — Telephone Encounter (Signed)
New message   Patient is having trouble setting up for the virtual visit today. Please call the patient.

## 2019-02-08 NOTE — Telephone Encounter (Signed)
This encounter was created in error - please disregard.

## 2019-02-19 DIAGNOSIS — R601 Generalized edema: Secondary | ICD-10-CM

## 2019-02-20 ENCOUNTER — Encounter: Payer: Self-pay | Admitting: Cardiology

## 2019-02-20 MED ORDER — FUROSEMIDE 20 MG PO TABS: 20.0000 mg | ORAL_TABLET | Freq: Every day | ORAL | 3 refills | Status: DC

## 2019-02-20 NOTE — Telephone Encounter (Signed)
New Message ° ° ° °Pt is returning call  ° ° ° °Please call back  °

## 2019-02-20 NOTE — Telephone Encounter (Signed)
Spoke with [name], Aware of dr crenshaw's recommendations. New script sent to the pharmacy and Lab orders mailed to the [name]  

## 2019-02-20 NOTE — Telephone Encounter (Signed)
Schedule echocardiogram to assess LV function. Discontinue isosorbide due to headache. Add Lasix 20 mg daily. Check potassium and renal function in 1 week.  Kirk Ruths   Left message for pt to call

## 2019-02-20 NOTE — Telephone Encounter (Signed)
This encounter was created in error - please disregard.

## 2019-02-22 ENCOUNTER — Telehealth (HOSPITAL_COMMUNITY): Payer: Self-pay | Admitting: Radiology

## 2019-02-22 NOTE — Telephone Encounter (Signed)

## 2019-02-25 ENCOUNTER — Other Ambulatory Visit: Payer: Self-pay

## 2019-02-25 ENCOUNTER — Ambulatory Visit (HOSPITAL_COMMUNITY): Payer: Medicare Other | Attending: Cardiology

## 2019-02-25 DIAGNOSIS — R601 Generalized edema: Secondary | ICD-10-CM

## 2019-02-26 DIAGNOSIS — H6983 Other specified disorders of Eustachian tube, bilateral: Secondary | ICD-10-CM | POA: Diagnosis not present

## 2019-02-26 DIAGNOSIS — H90A21 Sensorineural hearing loss, unilateral, right ear, with restricted hearing on the contralateral side: Secondary | ICD-10-CM | POA: Diagnosis not present

## 2019-02-26 DIAGNOSIS — H90A32 Mixed conductive and sensorineural hearing loss, unilateral, left ear with restricted hearing on the contralateral side: Secondary | ICD-10-CM | POA: Diagnosis not present

## 2019-03-01 ENCOUNTER — Ambulatory Visit: Payer: Medicare Other | Admitting: Cardiology

## 2019-03-27 ENCOUNTER — Encounter: Payer: Self-pay | Admitting: *Deleted

## 2019-03-27 DIAGNOSIS — R601 Generalized edema: Secondary | ICD-10-CM

## 2019-03-27 MED ORDER — FUROSEMIDE 20 MG PO TABS: 40.0000 mg | ORAL_TABLET | Freq: Every day | ORAL | 3 refills | Status: DC

## 2019-03-27 NOTE — Telephone Encounter (Signed)
This encounter was created in error - please disregard.

## 2019-03-27 NOTE — Addendum Note (Signed)
Addended by: Cristopher Estimable on: 03/27/2019 04:42 PM   Modules accepted: Orders

## 2019-03-27 NOTE — Telephone Encounter (Signed)
Stay off norvasc; increase lasix to 40 mg daily; check bmet Friday 6/19; needs echo scheduled soon; schedule in office fuov  Kirk Ruths   Spoke with pt, Aware of dr Jacalyn Lefevre recommendations. Follow up scheduled

## 2019-03-28 ENCOUNTER — Telehealth: Payer: Self-pay

## 2019-03-28 NOTE — Telephone Encounter (Signed)
Spoke with patient, wife per dpr, and advised her that I needed to move her husbands appt back 15 minutes due to schedule changes. She was agreeable and voice understanding. Appt moved to match flex clinic template.           COVID-19 Pre-Screening Questions:  . In the past 7 to 10 days have you had a cough,  shortness of breath, headache, congestion, fever (100 or greater) body aches, chills, sore throat, or sudden loss of taste or sense of smell? NO . Have you been around anyone with known Covid 19. NO . Have you been around anyone who is awaiting Covid 19 test results in the past 7 to 10 days? NO Have you been around anyone who has been exposed to Covid 19, or has mentioned symptoms of Covid 19 within the past 7 to 10 days? NO If you have any concerns/questions about symptoms patients report during screening (either on the phone or at threshold). Contact the provider seeing the patient or DOD for further guidance.  If neither are available contact a member of the leadership team.

## 2019-03-29 DIAGNOSIS — R601 Generalized edema: Secondary | ICD-10-CM | POA: Diagnosis not present

## 2019-03-29 LAB — BASIC METABOLIC PANEL
BUN/Creatinine Ratio: 19 (ref 10–24)
BUN: 25 mg/dL (ref 8–27)
CO2: 25 mmol/L (ref 20–29)
Calcium: 8.2 mg/dL — ABNORMAL LOW (ref 8.6–10.2)
Chloride: 104 mmol/L (ref 96–106)
Creatinine, Ser: 1.33 mg/dL — ABNORMAL HIGH (ref 0.76–1.27)
GFR calc Af Amer: 60 mL/min/{1.73_m2} (ref >59)
GFR calc non Af Amer: 52 mL/min/{1.73_m2} — ABNORMAL LOW (ref >59)
Glucose: 142 mg/dL — ABNORMAL HIGH (ref 65–99)
Potassium: 3.8 mmol/L (ref 3.5–5.2)
Sodium: 140 mmol/L (ref 134–144)

## 2019-04-03 ENCOUNTER — Other Ambulatory Visit: Payer: Self-pay

## 2019-04-03 ENCOUNTER — Ambulatory Visit (INDEPENDENT_AMBULATORY_CARE_PROVIDER_SITE_OTHER): Payer: Medicare Other | Admitting: Cardiology

## 2019-04-03 ENCOUNTER — Encounter: Payer: Self-pay | Admitting: Cardiology

## 2019-04-03 VITALS — BP 104/64 | HR 72 | Temp 99.1°F | Ht 69.0 in | Wt 173.0 lb

## 2019-04-03 DIAGNOSIS — R609 Edema, unspecified: Secondary | ICD-10-CM

## 2019-04-03 DIAGNOSIS — I10 Essential (primary) hypertension: Secondary | ICD-10-CM

## 2019-04-03 DIAGNOSIS — E119 Type 2 diabetes mellitus without complications: Secondary | ICD-10-CM | POA: Diagnosis not present

## 2019-04-03 DIAGNOSIS — Z9861 Coronary angioplasty status: Secondary | ICD-10-CM

## 2019-04-03 DIAGNOSIS — I2510 Atherosclerotic heart disease of native coronary artery without angina pectoris: Secondary | ICD-10-CM

## 2019-04-03 DIAGNOSIS — E785 Hyperlipidemia, unspecified: Secondary | ICD-10-CM | POA: Diagnosis not present

## 2019-04-03 NOTE — Assessment & Plan Note (Signed)
Hgb A1c was 6 in Jan 2020- on Glucophage

## 2019-04-03 NOTE — Progress Notes (Signed)
Cardiology Office Note:    Date:  04/03/2019   ID:  [name], [name] 05/09/1946, MRN [id number]  PCP:  Carollee Herter, Alferd Apa, DO  Cardiologist:  Kirk Ruths, MD  Electrophysiologist:  None   Referring MD: Carollee Herter, Alferd Apa, *   Chief Complaint  Patient presents with  . Follow-up    swelling and shortness of breath    History of Present Illness:    [name] is a 73 y.o. male with a hx of coronary disease, status post PCI to the LAD in 2003.  Catheterization in 2010 showed 40% narrowing in the LAD, he was treated medically.  Patient had a nuclear stress test in March 2020.  Was negative for ischemia but Dr. Stanford Breed notes he did have some ST changes.  Imdur was added but the patient could not tolerate it secondary to headaches.  The patient also had some issues with hypertension and was placed on amlodipine.  After this he had lower extremity edema and the amlodipine was discontinued.  The patient says the edema did not improve.  Lasix was added and increased.  He is here in the office today for follow-up.  Echocardiogram in May showed preserved LV function with an EF of 60 to 65%.  Patient tells me he has had some improvement in the edema with the increased Lasix and compression stockings.  He does note some orthostatic dizziness.  The blood pressure in the office today is 950 systolic.  Although he says his legs are still swollen, on exam he has at best trace ankle edema.  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  He is basically on his feet he says 10 hours a day.  Although he denies any chest pain he does feel like he has lost some of his stamina.   Past Medical History:  Diagnosis Date  . Allergy   . Arthritis   . CAD (coronary artery disease)    s/p PTCI in 2003 and 2004 to the LAD.   Marland Kitchen Cataract   . Chronic tubotympanic suppurative otitis media, bilateral   . Depression   . Diabetes mellitus   . Eustachian tube dysfunction   .  History of placement of ear tubes   . Hyperlipidemia   . Hypertension   . Hypothyroid   . Internal hemorrhoids   . Migraine headache   . Skin cancer, basal cell   . Thrombocytopenia ([place])   . Thrombocytopenia, unspecified ([place]) 05/08/2013  . TIA (transient ischemic attack) 2006   per patient's report.  He was never officially given this diagnosis    Past Surgical History:  Procedure Laterality Date  . COLONOSCOPY  2011  . coronary artery disease status post placement     of drug-eluting stent in the LAD in 2003,eEF 65% then  . esophogeal dilation    . fatty tissue removed     from neck 2003  . HERNIA REPAIR  2007  . POLYPECTOMY  2011   +TA  . right toe bone spur surgery    . THYROIDECTOMY, PARTIAL  07/2002   right thyroid  . TONSILLECTOMY    . UPPER GI ENDOSCOPY      Current Medications: Current Meds  Medication Sig  . aspirin 81 MG tablet Take 81 mg by mouth daily.    Marland Kitchen atorvastatin (LIPITOR) 40 MG tablet TAKE 1 TABLET(40 MG) BY MOUTH DAILY  . azelastine (ASTELIN) 0.1 % nasal spray Place  1 spray into both nostrils 2 (two) times daily. Use in each nostril as directed (Patient taking differently: Place 1 spray into both nostrils daily. Use in each nostril as directed)  . Biotin 1000 MCG tablet Take 1,000 mcg by mouth daily.   . cetirizine (ZYRTEC) 10 MG tablet Take 10 mg by mouth daily.  . Cinnamon 500 MG capsule Take 1,000 mg by mouth 2 (two) times daily.   Marland Kitchen CIPRODEX OTIC suspension INT 3 GTS INTO BOTH EARS QHS  . Coenzyme Q10 (COQ-10) 100 MG capsule Take 1 capsule (100 mg total) by mouth daily.  Marland Kitchen ezetimibe (ZETIA) 10 MG tablet Take 1 tablet (10 mg total) by mouth daily.  . furosemide (LASIX) 20 MG tablet Take 2 tablets (40 mg total) by mouth daily.  . Ginger, Zingiber officinalis, (GINGER ROOT) 550 MG CAPS Take 1 capsule by mouth 2 (two) times daily.   Marland Kitchen levothyroxine (SYNTHROID) 112 MCG tablet Take 1 tablet (112 mcg total) by mouth at bedtime.  Marland Kitchen  losartan-hydrochlorothiazide (HYZAAR) 100-12.5 MG tablet TAKE 1 TABLET BY MOUTH DAILY  . metFORMIN (GLUCOPHAGE) 500 MG tablet Take 1 tablet (500 mg total) by mouth 2 (two) times daily with a meal.  . Multiple Vitamins-Minerals (THERAGRAN-M ADVANCED 50 PLUS PO) Take by mouth.  . Nerve Stimulator (CEFALY KIT) DEVI by Does not apply route.  Marland Kitchen OVER THE COUNTER MEDICATION Migra-eeze: Take 1 gel capsule two times daily.  . pantoprazole (PROTONIX) 40 MG tablet Take 1 tablet (40 mg total) by mouth daily.  . piroxicam (FELDENE) 10 MG capsule 1-2 daily prn  . propranolol ER (INDERAL LA) 160 MG SR capsule TAKE 1 CAPSULE(160 MG) BY MOUTH DAILY  . tamsulosin (FLOMAX) 0.4 MG CAPS capsule Take 1 capsule (0.4 mg total) by mouth daily. Reported on 09/23/2015  . Turmeric 500 MG CAPS Take 2 capsules by mouth 2 (two) times a day.     Allergies:   Sulfa antibiotics, Sulfamethoxazole-trimethoprim, Sulfasalazine, Codeine, Crestor [rosuvastatin calcium], Hydrocodone-acetaminophen, Other, and Rosuvastatin   Social History   Socioeconomic History  . Marital status: Married    Spouse name: Charleen  . Number of children: 0  . Years of education: Not on file  . Highest education level: Not on file  Occupational History  . Occupation: retired    Fish farm manager: RETIRED    Comment: accountant  Social Needs  . Financial resource strain: Not on file  . Food insecurity    Worry: Not on file    Inability: Not on file  . Transportation needs    Medical: Not on file    Non-medical: Not on file  Tobacco Use  . Smoking status: Former Smoker    Years: 5.00    Types: Cigarettes    Quit date: 10/10/1965    Years since quitting: 53.5  . Smokeless tobacco: Never Used  Substance and Sexual Activity  . Alcohol use: No    Alcohol/week: 0.0 standard drinks    Comment: wine rarely  . Drug use: No  . Sexual activity: Yes    Partners: Female  Lifestyle  . Physical activity    Days per week: Not on file    Minutes per  session: Not on file  . Stress: Not on file  Relationships  . Social Herbalist on phone: Not on file    Gets together: Not on file    Attends religious service: Not on file    Active member of club or organization: Not on file  Attends meetings of clubs or organizations: Not on file    Relationship status: Not on file  Other Topics Concern  . Not on file  Social History Narrative   Exercise-- treadmill, stationary bike     Family History: The patient's family history includes COPD in his brother; Heart disease in his father and mother; Hypertension in his brother, brother, and brother; Mental illness in his mother; Prostate cancer in his brother. There is no history of Colon cancer, Colon polyps, Rectal cancer, Stomach cancer, or Esophageal cancer.  ROS:   Please see the history of present illness.     All other systems reviewed and are negative.  EKGs/Labs/Other Studies Reviewed:    The following studies were reviewed today: Echo May 2020  Recent Labs: 10/18/2018: ALT 17; TSH 2.23 03/29/2019: BUN 25; Creatinine, Ser 1.33; Potassium 3.8; Sodium 140  Recent Lipid Panel    Component Value Date/Time   CHOL 96 10/18/2018 0937   TRIG 54.0 10/18/2018 0937   HDL 51.80 10/18/2018 0937   CHOLHDL 2 10/18/2018 0937   VLDL 10.8 10/18/2018 0937   LDLCALC 34 10/18/2018 0937   LDLCALC 37 10/09/2017 1149    Physical Exam:    VS:  BP 104/64   Pulse 72   Temp 99.1 F (37.3 C)   Ht '5\' 9"'  (1.753 m)   Wt 173 lb (78.5 kg)   SpO2 96%   BMI 25.55 kg/m     Wt Readings from Last 3 Encounters:  04/03/19 173 lb (78.5 kg)  02/08/19 185 lb (83.9 kg)  02/05/19 185 lb (83.9 kg)     GEN:  Well nourished, well developed in no acute distress HEENT: Normal NECK: No JVD; No carotid bruits LYMPHATICS: No lymphadenopathy CARDIAC: RRR, no murmurs, rubs, gallops RESPIRATORY:  Clear to auscultation without rales, wheezing or rhonchi  ABDOMEN: Soft, non-tender,  non-distended MUSCULOSKELETAL:  Trace ankle edema edema; No deformity  SKIN: Warm and dry NEUROLOGIC:  Alert and oriented x 3 PSYCHIATRIC:  Normal affect   ASSESSMENT:    Hyperlipidemia LDL goal <70 LDL 34 Jan 2020  Essential hypertension Controlled- Echo May 2020- EF 60-65% with mild LVH  Non-insulin dependent type 2 diabetes mellitus (HCC) Hgb A1c was 6 in Jan 2020- on Glucophage  Edema LE edema presumably secondary to Amlodipine Normal LVF by echo May 2020  PLAN:    I suggested he stop his Lasix and continue to wear his compression stockings.  If he has recurrent LE edema we could consider venous study though he doesn't have varicosities on exam.  He had a recent BMP 03/29/2019 and is going to see his PCP in a week or so, labs to be done there.    Medication Adjustments/Labs and Tests Ordered: Current medicines are reviewed at length with the patient today.  Concerns regarding medicines are outlined above.  No orders of the defined types were placed in this encounter.  No orders of the defined types were placed in this encounter.   There are no Patient Instructions on file for this visit.   Angelena Form, PA-C  04/03/2019 10:32 AM    [facility]

## 2019-04-03 NOTE — Assessment & Plan Note (Signed)
LDL 34 Jan 2020

## 2019-04-03 NOTE — Assessment & Plan Note (Addendum)
Controlled- Echo May 2020- EF 60-65% with mild LVH

## 2019-04-03 NOTE — Assessment & Plan Note (Signed)
LE edema presumably secondary to Amlodipine Normal LVF by echo May 2020

## 2019-04-03 NOTE — Patient Instructions (Signed)
Medication Instructions:  STOP LASIX If you need a refill on your cardiac medications before your next appointment, please call your pharmacy.   Lab work: NONE  If you have labs (blood work) drawn today and your tests are completely normal, you will receive your results only by: Marland Kitchen MyChart Message (if you have MyChart) OR . A paper copy in the mail If you have any lab test that is abnormal or we need to change your treatment, we will call you to review the results.  Testing/Procedures: None   Follow-Up: At [facility], you and your health needs are our priority.  As part of our continuing mission to provide you with exceptional heart care, we have created designated Provider Care Teams.  These Care Teams include your primary Cardiologist (physician) and Advanced Practice Providers (APPs -  Physician Assistants and Nurse Practitioners) who all work together to provide you with the care you need, when you need it. You will need a follow up appointment in 6 months.  Please call our office 2 months in advance to schedule this appointment.  You may see Dr Kirk Ruths or one of the following Advanced Practice Providers on your designated Care Team:   Kerin Ransom, PA-C Roby Lofts, [place] . Sande Rives, PA-C  Any Other Special Instructions Will Be Listed Below (If Applicable). CONTACT THE OFFICE IF YOU START TO HAVE SWELLING OR IF YOU NEED ANYTHING CARDIAC RELATED ISSUES

## 2019-04-18 ENCOUNTER — Ambulatory Visit: Payer: Medicare Other | Admitting: Family Medicine

## 2019-04-18 ENCOUNTER — Other Ambulatory Visit: Payer: Self-pay

## 2019-04-22 ENCOUNTER — Encounter: Payer: Self-pay | Admitting: Family Medicine

## 2019-04-22 ENCOUNTER — Ambulatory Visit: Payer: Medicare Other | Admitting: Family Medicine

## 2019-04-22 ENCOUNTER — Ambulatory Visit (INDEPENDENT_AMBULATORY_CARE_PROVIDER_SITE_OTHER): Payer: Medicare Other | Admitting: Family Medicine

## 2019-04-22 ENCOUNTER — Other Ambulatory Visit: Payer: Self-pay

## 2019-04-22 VITALS — BP 142/73 | HR 61 | Temp 98.3°F | Resp 18 | Ht 69.0 in | Wt 179.4 lb

## 2019-04-22 DIAGNOSIS — Z9861 Coronary angioplasty status: Secondary | ICD-10-CM | POA: Diagnosis not present

## 2019-04-22 DIAGNOSIS — I10 Essential (primary) hypertension: Secondary | ICD-10-CM

## 2019-04-22 DIAGNOSIS — E785 Hyperlipidemia, unspecified: Secondary | ICD-10-CM

## 2019-04-22 DIAGNOSIS — E1165 Type 2 diabetes mellitus with hyperglycemia: Secondary | ICD-10-CM

## 2019-04-22 DIAGNOSIS — M159 Polyosteoarthritis, unspecified: Secondary | ICD-10-CM

## 2019-04-22 DIAGNOSIS — E039 Hypothyroidism, unspecified: Secondary | ICD-10-CM | POA: Diagnosis not present

## 2019-04-22 DIAGNOSIS — E119 Type 2 diabetes mellitus without complications: Secondary | ICD-10-CM

## 2019-04-22 DIAGNOSIS — I2510 Atherosclerotic heart disease of native coronary artery without angina pectoris: Secondary | ICD-10-CM | POA: Diagnosis not present

## 2019-04-22 LAB — COMPREHENSIVE METABOLIC PANEL
ALT: 16 U/L (ref 0–53)
AST: 19 U/L (ref 0–37)
Albumin: 2.9 g/dL — ABNORMAL LOW (ref 3.5–5.2)
Alkaline Phosphatase: 52 U/L (ref 39–117)
BUN: 26 mg/dL — ABNORMAL HIGH (ref 6–23)
CO2: 26 mEq/L (ref 19–32)
Calcium: 7.6 mg/dL — ABNORMAL LOW (ref 8.4–10.5)
Chloride: 109 mEq/L (ref 96–112)
Creatinine, Ser: 1.18 mg/dL (ref 0.40–1.50)
GFR: 60.14 mL/min (ref >60.00)
Glucose, Bld: 101 mg/dL — ABNORMAL HIGH (ref 70–99)
Potassium: 3.9 mEq/L (ref 3.5–5.1)
Sodium: 142 mEq/L (ref 135–145)
Total Bilirubin: 0.4 mg/dL (ref 0.2–1.2)
Total Protein: 4.4 g/dL — ABNORMAL LOW (ref 6.0–8.3)

## 2019-04-22 LAB — LIPID PANEL
Cholesterol: 83 mg/dL (ref 0–200)
HDL: 37.4 mg/dL — ABNORMAL LOW (ref >39.00)
LDL Cholesterol: 35 mg/dL (ref 0–99)
NonHDL: 45.52
Total CHOL/HDL Ratio: 2
Triglycerides: 55 mg/dL (ref 0.0–149.0)
VLDL: 11 mg/dL (ref 0.0–40.0)

## 2019-04-22 LAB — HEMOGLOBIN A1C: Hgb A1c MFr Bld: 6.3 % (ref 4.6–6.5)

## 2019-04-22 LAB — MICROALBUMIN / CREATININE URINE RATIO
Creatinine,U: 175.9 mg/dL
Microalb Creat Ratio: 0.7 mg/g (ref 0.0–30.0)
Microalb, Ur: 1.3 mg/dL (ref 0.0–1.9)

## 2019-04-22 LAB — TSH: TSH: 3.51 u[IU]/mL (ref 0.35–4.50)

## 2019-04-22 MED ORDER — LEVOTHYROXINE SODIUM 112 MCG PO TABS: 112.0000 ug | ORAL_TABLET | Freq: Every day | ORAL | 1 refills | Status: DC

## 2019-04-22 MED ORDER — EZETIMIBE 10 MG PO TABS: 10.0000 mg | ORAL_TABLET | Freq: Every day | ORAL | 5 refills | Status: DC

## 2019-04-22 MED ORDER — PIROXICAM 10 MG PO CAPS: ORAL_CAPSULE | ORAL | 1 refills | Status: DC

## 2019-04-22 NOTE — Patient Instructions (Addendum)
Carbohydrate Counting for Diabetes Mellitus, Adult  Carbohydrate counting is a method of keeping track of how many carbohydrates you eat. Eating carbohydrates naturally increases the amount of sugar (glucose) in the blood. Counting how many carbohydrates you eat helps keep your blood glucose within normal limits, which helps you manage your diabetes (diabetes mellitus). It is important to know how many carbohydrates you can safely have in each meal. This is different for every person. A diet and nutrition specialist (registered dietitian) can help you make a meal plan and calculate how many carbohydrates you should have at each meal and snack. Carbohydrates are found in the following foods:  Grains, such as breads and cereals.  Dried beans and soy products.  Starchy vegetables, such as potatoes, peas, and corn.  Fruit and fruit juices.  Milk and yogurt.  Sweets and snack foods, such as cake, cookies, candy, chips, and soft drinks. How do I count carbohydrates? There are two ways to count carbohydrates in food. You can use either of the methods or a combination of both. Reading "Nutrition Facts" on packaged food The "Nutrition Facts" list is included on the labels of almost all packaged foods and beverages in the [place] It includes:  The serving size.  Information about nutrients in each serving, including the grams (g) of carbohydrate per serving. To use the "Nutrition Facts":  Decide how many servings you will have.  Multiply the number of servings by the number of carbohydrates per serving.  The resulting number is the total amount of carbohydrates that you will be having. Learning standard serving sizes of other foods When you eat carbohydrate foods that are not packaged or do not include "Nutrition Facts" on the label, you need to measure the servings in order to count the amount of carbohydrates:  Measure the foods that you will eat with a food scale or measuring cup, if needed.   Decide how many standard-size servings you will eat.  Multiply the number of servings by 15. Most carbohydrate-rich foods have about 15 g of carbohydrates per serving. ? For example, if you eat 8 oz (170 g) of strawberries, you will have eaten 2 servings and 30 g of carbohydrates (2 servings x 15 g = 30 g).  For foods that have more than one food mixed, such as soups and casseroles, you must count the carbohydrates in each food that is included. The following list contains standard serving sizes of common carbohydrate-rich foods. Each of these servings has about 15 g of carbohydrates:   hamburger bun or  English muffin.   oz (15 mL) syrup.   oz (14 g) jelly.  1 slice of bread.  1 six-inch tortilla.  3 oz (85 g) cooked rice or pasta.  4 oz (113 g) cooked dried beans.  4 oz (113 g) starchy vegetable, such as peas, corn, or potatoes.  4 oz (113 g) hot cereal.  4 oz (113 g) mashed potatoes or  of a large baked potato.  4 oz (113 g) canned or frozen fruit.  4 oz (120 mL) fruit juice.  4-6 crackers.  6 chicken nuggets.  6 oz (170 g) unsweetened dry cereal.  6 oz (170 g) plain fat-free yogurt or yogurt sweetened with artificial sweeteners.  8 oz (240 mL) milk.  8 oz (170 g) fresh fruit or one small piece of fruit.  24 oz (680 g) popped popcorn. Example of carbohydrate counting Sample meal  3 oz (85 g) chicken breast.  6 oz (170 g)   brown rice.  4 oz (113 g) corn.  8 oz (240 mL) milk.  8 oz (170 g) strawberries with sugar-free whipped topping. Carbohydrate calculation 1. Identify the foods that contain carbohydrates: ? Rice. ? Corn. ? Milk. ? Strawberries. 2. Calculate how many servings you have of each food: ? 2 servings rice. ? 1 serving corn. ? 1 serving milk. ? 1 serving strawberries. 3. Multiply each number of servings by 15 g: ? 2 servings rice x 15 g = 30 g. ? 1 serving corn x 15 g = 15 g. ? 1 serving milk x 15 g = 15 g. ? 1 serving  strawberries x 15 g = 15 g. 4. Add together all of the amounts to find the total grams of carbohydrates eaten: ? 30 g + 15 g + 15 g + 15 g = 75 g of carbohydrates total. Summary  Carbohydrate counting is a method of keeping track of how many carbohydrates you eat.  Eating carbohydrates naturally increases the amount of sugar (glucose) in the blood.  Counting how many carbohydrates you eat helps keep your blood glucose within normal limits, which helps you manage your diabetes.  A diet and nutrition specialist (registered dietitian) can help you make a meal plan and calculate how many carbohydrates you should have at each meal and snack. This information is not intended to replace advice given to you by your health care provider. Make sure you discuss any questions you have with your health care provider. Document Released: 09/26/2005 Document Revised: 04/20/2017 Document Reviewed: 03/09/2016 Elsevier Patient Education  2020 [employer]  

## 2019-04-22 NOTE — Assessment & Plan Note (Signed)
Tolerating statin, encouraged heart healthy diet, avoid trans fats, minimize simple carbs and saturated fats. Increase exercise as tolerated 

## 2019-04-22 NOTE — Assessment & Plan Note (Signed)
Check labs  hgba1c to be checked, minimize simple carbs. Increase exercise as tolerated. Continue current meds  

## 2019-04-22 NOTE — Assessment & Plan Note (Signed)
Check labs con't meds Followed by cardiology

## 2019-04-22 NOTE — Assessment & Plan Note (Signed)
Well controlled, no changes to meds. Encouraged heart healthy diet such as the DASH diet and exercise as tolerated.  °

## 2019-04-22 NOTE — Assessment & Plan Note (Signed)
Check labs con't meds 

## 2019-04-22 NOTE — Progress Notes (Signed)
Patient ID: [name], male    DOB: 1945/12/03  Age: 73 y.o. MRN: [id number]    Subjective:  Subjective  HPI [name] presents for f/u and will be going to [facility] for bone anchor hearing aids with Dr Thornell Mule-- he has retired and is teaching at [employer].   He is also see cardiology and has been there about the swelling in his legs and sob.  They are adjusting his meds and he is wearing compression socks.  No other complaints.    Review of Systems  Constitutional: Negative for chills and fever.  HENT: Negative for congestion and hearing loss.   Eyes: Negative for discharge.  Respiratory: Negative for cough and shortness of breath.   Cardiovascular: Positive for leg swelling. Negative for chest pain and palpitations.  Gastrointestinal: Negative for abdominal pain, blood in stool, constipation, diarrhea, nausea and vomiting.  Genitourinary: Negative for dysuria, frequency, hematuria and urgency.  Musculoskeletal: Negative for back pain and myalgias.  Skin: Negative for rash.  Allergic/Immunologic: Negative for environmental allergies.  Neurological: Negative for dizziness, weakness and headaches.  Hematological: Does not bruise/bleed easily.  Psychiatric/Behavioral: Negative for suicidal ideas. The patient is not nervous/anxious.     History Past Medical History:  Diagnosis Date  . Allergy   . Arthritis   . CAD (coronary artery disease)    s/p PTCI in 2003 and 2004 to the LAD.   Marland Kitchen Cataract   . Chronic tubotympanic suppurative otitis media, bilateral   . Depression   . Diabetes mellitus   . Eustachian tube dysfunction   . History of placement of ear tubes   . Hyperlipidemia   . Hypertension   . Hypothyroid   . Internal hemorrhoids   . Migraine headache   . Skin cancer, basal cell   . Thrombocytopenia ([place])   . Thrombocytopenia, unspecified ([place]) 05/08/2013  . TIA (transient ischemic attack) 2006   per patient's report.  He was never officially given this diagnosis    He has a past surgical history that includes Hernia repair (2007); fatty tissue removed; Thyroidectomy, partial (07/2002); coronary artery disease status post placement; right toe bone spur surgery; Tonsillectomy; Colonoscopy (2011); Polypectomy (2011); Upper gi endoscopy; and esophogeal dilation.   His family history includes COPD in his brother; Heart disease in his father and mother; Hypertension in his brother, brother, and brother; Mental illness in his mother; Prostate cancer in his brother.He reports that he quit smoking about 53 years ago. His smoking use included cigarettes. He quit after 5.00 years of use. He has never used smokeless tobacco. He reports that he does not drink alcohol or use drugs.  Current Outpatient Medications on File Prior to Visit  Medication Sig Dispense Refill  . aspirin 81 MG tablet Take 81 mg by mouth daily.      Marland Kitchen atorvastatin (LIPITOR) 40 MG tablet TAKE 1 TABLET(40 MG) BY MOUTH DAILY 30 tablet 10  . azelastine (ASTELIN) 0.1 % nasal spray Place 1 spray into both nostrils 2 (two) times daily. Use in each nostril as directed (Patient taking differently: Place 1 spray into both nostrils daily. Use in each nostril as directed) 30 mL 12  . Biotin 1000 MCG tablet Take 1,000 mcg by mouth daily.     . cetirizine (ZYRTEC) 10 MG tablet Take 10 mg by mouth daily.    . Cinnamon 500 MG capsule Take 1,000 mg by mouth 2 (two) times daily.     Marland Kitchen CIPRODEX OTIC suspension INT 3 GTS  INTO BOTH EARS QHS    . Coenzyme Q10 (COQ-10) 100 MG capsule Take 1 capsule (100 mg total) by mouth daily.    . Ginger, Zingiber officinalis, (GINGER ROOT) 550 MG CAPS Take 1 capsule by mouth 2 (two) times daily.     Marland Kitchen losartan-hydrochlorothiazide (HYZAAR) 100-12.5 MG tablet TAKE 1 TABLET BY MOUTH DAILY 30 tablet 10  . metFORMIN (GLUCOPHAGE) 500 MG tablet Take 1 tablet (500 mg total) by mouth 2 (two) times daily with a meal. 60 tablet 5  . Multiple Vitamins-Minerals (THERAGRAN-M ADVANCED 50 PLUS PO)  Take by mouth.    . Nerve Stimulator (CEFALY KIT) DEVI by Does not apply route.    Marland Kitchen OVER THE COUNTER MEDICATION Migra-eeze: Take 1 gel capsule two times daily.    . pantoprazole (PROTONIX) 40 MG tablet Take 1 tablet (40 mg total) by mouth daily. 90 tablet 4  . propranolol ER (INDERAL LA) 160 MG SR capsule TAKE 1 CAPSULE(160 MG) BY MOUTH DAILY 30 capsule 5  . tamsulosin (FLOMAX) 0.4 MG CAPS capsule Take 1 capsule (0.4 mg total) by mouth daily. Reported on 09/23/2015 30 capsule   . Turmeric 500 MG CAPS Take 2 capsules by mouth 2 (two) times a day.     No current facility-administered medications on file prior to visit.      Objective:  Objective  Physical Exam Vitals signs and nursing note reviewed.  Constitutional:      General: He is sleeping.     Appearance: He is well-developed.  HENT:     Head: Normocephalic and atraumatic.  Eyes:     Pupils: Pupils are equal, round, and reactive to light.  Neck:     Musculoskeletal: Normal range of motion and neck supple.     Thyroid: No thyromegaly.  Cardiovascular:     Rate and Rhythm: Normal rate and regular rhythm.     Heart sounds: No murmur.  Pulmonary:     Effort: Pulmonary effort is normal. No respiratory distress.     Breath sounds: Normal breath sounds. No wheezing or rales.  Chest:     Chest wall: No tenderness.  Musculoskeletal:        General: Swelling present. No tenderness.     Right lower leg: Edema present.     Left lower leg: Edema present.  Skin:    General: Skin is warm and dry.  Neurological:     Mental Status: He is oriented to person, place, and time.  Psychiatric:        Behavior: Behavior normal.        Thought Content: Thought content normal.        Judgment: Judgment normal.    Diabetic Foot Exam - Simple   Simple Foot Form Diabetic Foot exam was performed with the following findings: Yes 04/22/2019  9:15 AM  Visual Inspection No deformities, no ulcerations, no other skin breakdown bilaterally: Yes  Sensation Testing Intact to touch and monofilament testing bilaterally: Yes Pulse Check Posterior Tibialis and Dorsalis pulse intact bilaterally: Yes Comments    BP (!) 142/73 (BP Location: Left Arm, Patient Position: Sitting, Cuff Size: Normal)   Pulse 61   Temp 98.3 F (36.8 C) (Oral)   Resp 18   Ht '5\' 9"'  (1.753 m)   Wt 179 lb 6.4 oz (81.4 kg)   SpO2 99%   BMI 26.49 kg/m  Wt Readings from Last 3 Encounters:  04/22/19 179 lb 6.4 oz (81.4 kg)  04/03/19 173 lb (78.5 kg)  02/08/19  185 lb (83.9 kg)     Lab Results  Component Value Date   WBC 6.6 05/17/2017   HGB 14.5 05/17/2017   HCT 39.5 05/17/2017   PLT 107 (L) 05/17/2017   GLUCOSE 101 (H) 04/22/2019   CHOL 83 04/22/2019   TRIG 55.0 04/22/2019   HDL 37.40 (L) 04/22/2019   LDLCALC 35 04/22/2019   ALT 16 04/22/2019   AST 19 04/22/2019   NA 142 04/22/2019   K 3.9 04/22/2019   CL 109 04/22/2019   CREATININE 1.18 04/22/2019   BUN 26 (H) 04/22/2019   CO2 26 04/22/2019   TSH 3.51 04/22/2019   PSA 5.08 (H) 10/18/2018   INR 1.0 RATIO 12/11/2008   HGBA1C 6.3 04/22/2019   MICROALBUR 1.3 04/22/2019    Ct Temporal Bones W Contrast  Addendum Date: 01/04/2019   ADDENDUM REPORT: 01/04/2019 17:44 ADDENDUM: 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 Electronically Signed   By: Logan Bores M.D.   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 Creatinine was obtained on site at [facility] at [address] Results: Creatinine 1.6 mg/dL. EXAM: CT TEMPORAL BONES WITH CONTRAST TECHNIQUE: Axial and coronal plane CT imaging of the petrous temporal bones was performed with thin-collimation image reconstruction after intravenous contrast administration. Multiplanar CT image reconstructions were also generated. CONTRAST:  48m ISOVUE-300 IOPAMIDOL (ISOVUE-300) INJECTION 61% COMPARISON:  None. FINDINGS: RIGHT: The external auditory  canals unremarkable. A tympanostomy tube is in place. The ossicles appear intact. The tympanic cavity is clear. The internal auditory canal, cochlea, vestibule, and semicircular canals are unremarkable. There is a small to moderate-sized mastoid effusion without air cell coalescence and with a sclerotic appearance of the mastoid tip. LEFT: The external auditory canal is unremarkable. The tympanic membrane appears mildly thickened superiorly with a perforation noted centrally/inferiorly. There is minimal soft tissue adjacent to the ossicles in the epitympanum. Prussak's space is mostly clear, and no osseous erosion is evident. The ossicles appear intact. No bone is visible covering a portion of the superior semicircular canal. The internal auditory canal, cochlea, vestibule, and semicircular canals are otherwise unremarkable. There is a small to moderate-sized mastoid effusion without air cell coalescence and with a mildly sclerotic appearance at the mastoid tip. A small left maxillary sinus mucous retention cyst is noted, and there is minimal bilateral ethmoid air cell mucosal thickening. The visualized portion of the brain is unremarkable. There is moderate atherosclerotic calcification of the carotid siphons and distal vertebral arteries. Visualized orbits are unremarkable. A 1 cm low-density subcutaneous lesion is partially visualized in the right suboccipital region and appears to extend to the cutaneous tissues with mild stranding in the adjacent subcutaneous fat. IMPRESSION: 1. Small to moderate-sized bilateral mastoid effusions without air cell coalescence. Sclerotic appearance at the mastoid tips suggest chronicity. 2. Left tympanic membrane perforation. Minimal soft tissue in the epitympanum without osseous erosion to suggest cholesteatoma. 3. Right tympanostomy tube in place. 4. 1 cm subcutaneous lesion in the right suboccipital region, incompletely visualized but possibly a sebaceous cyst. Mild  surrounding stranding could reflect infection. Correlate with physical examination. Electronically Signed: By: ALogan BoresM.D. On: 01/04/2019 17:22     Assessment & Plan:  Plan  I am having JOk Edwardsmaintain his aspirin, Biotin, Cinnamon, Ginger Root, tamsulosin, OVER THE COUNTER MEDICATION, propranolol ER, Cefaly Kit, metFORMIN, azelastine, Multiple Vitamins-Minerals (THERAGRAN-M ADVANCED 50 PLUS PO), pantoprazole, atorvastatin, losartan-hydrochlorothiazide, Ciprodex, cetirizine, CoQ-10, Turmeric, levothyroxine, piroxicam,  and ezetimibe.  Meds ordered this encounter  Medications  . levothyroxine (SYNTHROID) 112 MCG tablet    Sig: Take 1 tablet (112 mcg total) by mouth at bedtime.    Dispense:  90 tablet    Refill:  1  . piroxicam (FELDENE) 10 MG capsule    Sig: 1-2 daily prn    Dispense:  200 capsule    Refill:  1  . ezetimibe (ZETIA) 10 MG tablet    Sig: Take 1 tablet (10 mg total) by mouth daily.    Dispense:  30 tablet    Refill:  5    Problem List Items Addressed This Visit      Unprioritized   CAD S/P percutaneous coronary angioplasty    Check labs con't meds Followed by cardiology       Relevant Medications   ezetimibe (ZETIA) 10 MG tablet   Essential hypertension    Well controlled, no changes to meds. Encouraged heart healthy diet such as the DASH diet and exercise as tolerated.       Relevant Medications   ezetimibe (ZETIA) 10 MG tablet   Other Relevant Orders   Comprehensive metabolic panel (Completed)   Hyperlipidemia LDL goal <70    Tolerating statin, encouraged heart healthy diet, avoid trans fats, minimize simple carbs and saturated fats. Increase exercise as tolerated      Relevant Medications   ezetimibe (ZETIA) 10 MG tablet   Other Relevant Orders   Lipid panel (Completed)   Hemoglobin A1c (Completed)   Hypothyroidism    Check labs con't meds      Relevant Medications   levothyroxine (SYNTHROID) 112 MCG tablet   Other Relevant Orders    TSH (Completed)   Non-insulin dependent type 2 diabetes mellitus (HCC) (Chronic)    Check labs hgba1c to be checked, minimize simple carbs. Increase exercise as tolerated. Continue current meds        Other Visit Diagnoses    Type 2 diabetes mellitus with hyperglycemia, without long-term current use of insulin (HCC)    -  Primary   Relevant Orders   Hemoglobin A1c (Completed)   Comprehensive metabolic panel (Completed)   Microalbumin / creatinine urine ratio (Completed)   Osteoarthritis of multiple joints, unspecified osteoarthritis type       Relevant Medications   piroxicam (FELDENE) 10 MG capsule      Follow-up: Return in about 6 months (around 10/23/2019), or if symptoms worsen or fail to improve, for annual exam, fasting.  [name], DO

## 2019-04-30 ENCOUNTER — Other Ambulatory Visit: Payer: Self-pay | Admitting: Family Medicine

## 2019-04-30 DIAGNOSIS — E8809 Other disorders of plasma-protein metabolism, not elsewhere classified: Secondary | ICD-10-CM

## 2019-05-02 ENCOUNTER — Encounter: Payer: Self-pay | Admitting: Family Medicine

## 2019-05-03 ENCOUNTER — Encounter: Payer: Self-pay | Admitting: *Deleted

## 2019-05-14 ENCOUNTER — Telehealth: Payer: Self-pay

## 2019-05-14 NOTE — Telephone Encounter (Signed)
Spoke to patient, and he had his socks on for only about 2 minutes and then took them off to have a line. Swelling has been occurring for a while, these photos were in preparation for the visit on Thursday as a virtual visit- changes in medication have been made per wife, and the swelling continues to be an issue.  They have not been checking his weight- suggested they do that for the next few days to see if any increase in weight is an issue before the visit. They verbalized understanding. Patient denies any cardiac symptoms, chest pain, SOB only the swelling is his issue. Advised I would route to MD to make aware for visit on Thursday pictures are in [place] message.

## 2019-05-15 NOTE — Progress Notes (Signed)
Virtual Visit via Video Note   This visit type was conducted due to national recommendations for restrictions regarding the COVID-19 Pandemic (e.g. social distancing) in an effort to limit this patient's exposure and mitigate transmission in our community.  Due to his co-morbid illnesses, this patient is at least at moderate risk for complications without adequate follow up.  This format is felt to be most appropriate for this patient at this time.  All issues noted in this document were discussed and addressed.  A limited physical exam was performed with this format.  Please refer to the patient's chart for his consent to telehealth for [facility].   Date:  05/16/2019   ID:  [name], DOB 12/05/1945, MRN [id number]  Patient Location:Home Provider Location: Home  PCP:  [name], DO  Cardiologist:  Dr Stanford Breed  Evaluation Performed:  Follow-Up Visit  Chief Complaint:  FU CAD  History of Present Illness:    FU CAD with prior PCI of his LAD in May 2003. His last cardiac catheterization was performed on December 16, 2008 by Dr. Olevia Perches for exertional chest pain. He was found to have a 30-40% LAD just distal to the stent. There was no other obstructive disease noted. The ejection fraction is 50%. He has been treated medically. Abdominal ultrasound in Oct 2011 showed no aneurysm. Nuclear study in March 2020 showed ejection fraction 56%.  Patient had significant ST changes but perfusion was normal.    Echocardiogram May 2020 showed normal LV function, mild left atrial enlargement.  Developed pedal edema recently and amlodipine discontinued.  Primary care noted albumin was low and apparently referred patient to nephrology as he was not responding to diuretics.  Since I last saw him,he has noted some dyspnea on exertion.  He continues with bilateral lower extremity edema.  There is no orthopnea or PND.  No chest pain or syncope.  The patient does not have symptoms concerning for  COVID-19 infection (fever, chills, cough, or new shortness of breath).    Past Medical History:  Diagnosis Date  . Allergy   . Arthritis   . CAD (coronary artery disease)    s/p PTCI in 2003 and 2004 to the LAD.   Marland Kitchen Cataract   . Chronic tubotympanic suppurative otitis media, bilateral   . Depression   . Diabetes mellitus   . Eustachian tube dysfunction   . History of placement of ear tubes   . Hyperlipidemia   . Hypertension   . Hypothyroid   . Internal hemorrhoids   . Migraine headache   . Skin cancer, basal cell   . Thrombocytopenia ([place])   . Thrombocytopenia, unspecified ([place]) 05/08/2013  . TIA (transient ischemic attack) 2006   per patient's report.  He was never officially given this diagnosis   Past Surgical History:  Procedure Laterality Date  . COLONOSCOPY  2011  . coronary artery disease status post placement     of drug-eluting stent in the LAD in 2003,eEF 65% then  . esophogeal dilation    . fatty tissue removed     from neck 2003  . HERNIA REPAIR  2007  . POLYPECTOMY  2011   +TA  . right toe bone spur surgery    . THYROIDECTOMY, PARTIAL  07/2002   right thyroid  . TONSILLECTOMY    . UPPER GI ENDOSCOPY       Current Meds  Medication Sig  . aspirin 81 MG tablet Take 81 mg by mouth daily.    Marland Kitchen  atorvastatin (LIPITOR) 40 MG tablet TAKE 1 TABLET(40 MG) BY MOUTH DAILY  . azelastine (ASTELIN) 0.1 % nasal spray Place 1 spray into both nostrils 2 (two) times daily. Use in each nostril as directed (Patient taking differently: Place 1 spray into both nostrils daily. Use in each nostril as directed)  . Biotin 1000 MCG tablet Take 1,000 mcg by mouth daily.   . cetirizine (ZYRTEC) 10 MG tablet Take 10 mg by mouth daily.  . Cinnamon 500 MG capsule Take 1,000 mg by mouth 2 (two) times daily.   Marland Kitchen CIPRODEX OTIC suspension INT 3 GTS INTO BOTH EARS QHS  . Coenzyme Q10 (COQ-10) 100 MG capsule Take 1 capsule (100 mg total) by mouth daily.  Marland Kitchen ezetimibe (ZETIA) 10 MG tablet  Take 1 tablet (10 mg total) by mouth daily.  . Ginger, Zingiber officinalis, (GINGER ROOT) 550 MG CAPS Take 1 capsule by mouth 2 (two) times daily.   Marland Kitchen levothyroxine (SYNTHROID) 112 MCG tablet Take 1 tablet (112 mcg total) by mouth at bedtime.  Marland Kitchen losartan-hydrochlorothiazide (HYZAAR) 100-12.5 MG tablet TAKE 1 TABLET BY MOUTH DAILY  . metFORMIN (GLUCOPHAGE) 500 MG tablet Take 1 tablet (500 mg total) by mouth 2 (two) times daily with a meal.  . Multiple Vitamins-Minerals (THERAGRAN-M ADVANCED 50 PLUS PO) Take by mouth.  . Nerve Stimulator (CEFALY KIT) DEVI by Does not apply route.  Marland Kitchen OVER THE COUNTER MEDICATION Migra-eeze: Take 1 gel capsule two times daily.  . pantoprazole (PROTONIX) 40 MG tablet Take 1 tablet (40 mg total) by mouth daily.  . piroxicam (FELDENE) 10 MG capsule 1-2 daily prn  . propranolol ER (INDERAL LA) 160 MG SR capsule TAKE 1 CAPSULE(160 MG) BY MOUTH DAILY  . tamsulosin (FLOMAX) 0.4 MG CAPS capsule Take 1 capsule (0.4 mg total) by mouth daily. Reported on 09/23/2015  . Turmeric 500 MG CAPS Take 2 capsules by mouth 2 (two) times a day.     Allergies:   Sulfa antibiotics, Sulfamethoxazole-trimethoprim, Sulfasalazine, Codeine, Crestor [rosuvastatin calcium], Hydrocodone-acetaminophen, Other, and Rosuvastatin   Social History   Tobacco Use  . Smoking status: Former Smoker    Years: 5.00    Types: Cigarettes    Quit date: 10/10/1965    Years since quitting: 53.6  . Smokeless tobacco: Never Used  Substance Use Topics  . Alcohol use: No    Alcohol/week: 0.0 standard drinks    Comment: wine rarely  . Drug use: No     Family Hx: The patient's family history includes COPD in his brother; Heart disease in his father and mother; Hypertension in his brother, brother, and brother; Mental illness in his mother; Prostate cancer in his brother. There is no history of Colon cancer, Colon polyps, Rectal cancer, Stomach cancer, or Esophageal cancer.  ROS:   Please see the history of  present illness.    No Fever, chills  or productive cough All other systems reviewed and are negative.   Recent Labs: 04/22/2019: ALT 16; BUN 26; Creatinine, Ser 1.18; Potassium 3.9; Sodium 142; TSH 3.51   Recent Lipid Panel Lab Results  Component Value Date/Time   CHOL 83 04/22/2019 09:25 AM   TRIG 55.0 04/22/2019 09:25 AM   HDL 37.40 (L) 04/22/2019 09:25 AM   CHOLHDL 2 04/22/2019 09:25 AM   LDLCALC 35 04/22/2019 09:25 AM   LDLCALC 37 10/09/2017 11:49 AM    Wt Readings from Last 3 Encounters:  05/16/19 171 lb (77.6 kg)  04/22/19 179 lb 6.4 oz (81.4 kg)  04/03/19 173  lb (78.5 kg)     Objective:    Vital Signs:  BP 119/74   Pulse 70   Ht '5\' 9"'  (1.753 m)   Wt 171 lb (77.6 kg)   SpO2 99%   BMI 25.25 kg/m    VITAL SIGNS:  reviewed NAD Answers questions appropriately Normal affect Remainder of physical examination not performed (telehealth visit; coronavirus pandemic)  ASSESSMENT & PLAN:    1. Coronary artery disease-patient denies chest pain.  Continue medical therapy with aspirin and statin.  He did not tolerate isosorbide because of headaches. 2. Hypertension-patient's blood pressure is controlled.  However he had orthostatic symptoms previously and continues with lower extremity edema.  Discontinue Hyzaar.  Stay treat with losartan 50 mg daily and add Lasix 20 mg daily.  Check potassium and renal function in 1 week. 3. Hyperlipidemia-continue statin. 4. Pedal edema-patient has developed bilateral lower extremity edema.  Etiology unclear.  Norvasc was discontinued previously.  He also complains of some dyspnea on exertion.  Echocardiogram shows preserved LV function.  Recent TSH normal.  Albumin 2.9.  I will discontinue Hyzaar as he had some orthostatic symptoms with Lasix previously.  Instead we will treat with losartan 50 mg daily.  Add Lasix 20 mg daily.  Check potassium and renal function in 1 week.  We discussed fluid restriction and low-sodium diet.  He was referred  to nephrology given low albumin.  I am not convinced this is the sole contributor to his peripheral edema.  May need to consider right heart catheterization in the future or work-up for amyloid.  COVID-19 Education: The importance of social distancing was discussed today.  Time:   Today, I have spent 18 minutes with the patient with telehealth technology discussing the above problems.     Medication Adjustments/Labs and Tests Ordered: Current medicines are reviewed at length with the patient today.  Concerns regarding medicines are outlined above.   Tests Ordered: No orders of the defined types were placed in this encounter.   Medication Changes: No orders of the defined types were placed in this encounter.   Follow Up:  Virtual Visit or In Person in 6 week(s)  Signed, Kirk Ruths, MD  05/16/2019 9:51 AM    [place]

## 2019-05-16 ENCOUNTER — Telehealth (INDEPENDENT_AMBULATORY_CARE_PROVIDER_SITE_OTHER): Payer: Medicare Other | Admitting: Cardiology

## 2019-05-16 ENCOUNTER — Other Ambulatory Visit: Payer: Self-pay

## 2019-05-16 VITALS — BP 119/74 | HR 70 | Ht 69.0 in | Wt 171.0 lb

## 2019-05-16 DIAGNOSIS — Z9861 Coronary angioplasty status: Secondary | ICD-10-CM

## 2019-05-16 DIAGNOSIS — E785 Hyperlipidemia, unspecified: Secondary | ICD-10-CM

## 2019-05-16 DIAGNOSIS — R609 Edema, unspecified: Secondary | ICD-10-CM

## 2019-05-16 DIAGNOSIS — I2510 Atherosclerotic heart disease of native coronary artery without angina pectoris: Secondary | ICD-10-CM

## 2019-05-16 DIAGNOSIS — I10 Essential (primary) hypertension: Secondary | ICD-10-CM | POA: Diagnosis not present

## 2019-05-16 MED ORDER — LOSARTAN POTASSIUM 50 MG PO TABS: 50.0000 mg | ORAL_TABLET | Freq: Every day | ORAL | 3 refills | Status: DC

## 2019-05-16 MED ORDER — FUROSEMIDE 20 MG PO TABS: 20.0000 mg | ORAL_TABLET | Freq: Every day | ORAL | 3 refills | Status: DC

## 2019-05-16 NOTE — Patient Instructions (Signed)
Medication Instructions:  STOP HYZAAR  START LOSARTAN 50 MG ONCE DAILY  START FUROSEMIDE 20 MG ONCE DAILY  If you need a refill on your cardiac medications before your next appointment, please call your pharmacy.   Lab work: Your physician recommends that you return for lab work in: [place]  If you have labs (blood work) drawn today and your tests are completely normal, you will receive your results only by: Marland Kitchen MyChart Message (if you have MyChart) OR . A paper copy in the mail If you have any lab test that is abnormal or we need to change your treatment, we will call you to review the results.  Follow-Up: At [facility], you and your health needs are our priority.  As part of our continuing mission to provide you with exceptional heart care, we have created designated Provider Care Teams.  These Care Teams include your primary Cardiologist (physician) and Advanced Practice Providers (APPs -  Physician Assistants and Nurse Practitioners) who all work together to provide you with the care you need, when you need it. Your physician recommends that you schedule a follow-up appointment in: [place]

## 2019-05-23 DIAGNOSIS — R609 Edema, unspecified: Secondary | ICD-10-CM | POA: Diagnosis not present

## 2019-05-23 LAB — BASIC METABOLIC PANEL
BUN/Creatinine Ratio: 18 (ref 10–24)
BUN: 24 mg/dL (ref 8–27)
CO2: 24 mmol/L (ref 20–29)
Calcium: 8.1 mg/dL — ABNORMAL LOW (ref 8.6–10.2)
Chloride: 108 mmol/L — ABNORMAL HIGH (ref 96–106)
Creatinine, Ser: 1.31 mg/dL — ABNORMAL HIGH (ref 0.76–1.27)
GFR calc Af Amer: 61 mL/min/{1.73_m2} (ref >59)
GFR calc non Af Amer: 53 mL/min/{1.73_m2} — ABNORMAL LOW (ref >59)
Glucose: 103 mg/dL — ABNORMAL HIGH (ref 65–99)
Potassium: 4.6 mmol/L (ref 3.5–5.2)
Sodium: 143 mmol/L (ref 134–144)

## 2019-06-06 DIAGNOSIS — L814 Other melanin hyperpigmentation: Secondary | ICD-10-CM | POA: Diagnosis not present

## 2019-06-06 DIAGNOSIS — L819 Disorder of pigmentation, unspecified: Secondary | ICD-10-CM | POA: Diagnosis not present

## 2019-06-06 DIAGNOSIS — Z85828 Personal history of other malignant neoplasm of skin: Secondary | ICD-10-CM | POA: Diagnosis not present

## 2019-06-06 DIAGNOSIS — L821 Other seborrheic keratosis: Secondary | ICD-10-CM | POA: Diagnosis not present

## 2019-06-06 DIAGNOSIS — D1801 Hemangioma of skin and subcutaneous tissue: Secondary | ICD-10-CM | POA: Diagnosis not present

## 2019-06-06 DIAGNOSIS — D229 Melanocytic nevi, unspecified: Secondary | ICD-10-CM | POA: Diagnosis not present

## 2019-06-06 DIAGNOSIS — D485 Neoplasm of uncertain behavior of skin: Secondary | ICD-10-CM | POA: Diagnosis not present

## 2019-06-06 DIAGNOSIS — L570 Actinic keratosis: Secondary | ICD-10-CM | POA: Diagnosis not present

## 2019-06-07 DIAGNOSIS — L820 Inflamed seborrheic keratosis: Secondary | ICD-10-CM | POA: Diagnosis not present

## 2019-06-19 NOTE — Progress Notes (Signed)
HPI: FU CAD with prior PCI of his LAD in May 2003. His last cardiac catheterization was performed on December 16, 2008 by Dr. Olevia Perches for exertional chest pain. He was found to have a 30-40% LAD just distal to the stent. There was no other obstructive disease noted. The ejection fraction is 50%. He has been treated medically. Abdominal ultrasound in Oct 2011 showed no aneurysm. Nuclear study inMarch 2020 showed ejection fraction 56%. Patient had significant ST changes but perfusion was normal.   Echocardiogram May 2020 showed normal LV function, mild left atrial enlargement.  Developed pedal edema recently and amlodipine discontinued.  Primary care noted albumin was low and apparently referred patient to nephrology as he was not responding to diuretics.  Since I last saw him,he has dyspnea with more vigorous activities but not routine activities.  He denies orthopnea or PND but continues to have bilateral lower extremity edema.  No chest pain or syncope.  He will require ear surgery in the near future.  Current Outpatient Medications  Medication Sig Dispense Refill  . aspirin 81 MG tablet Take 81 mg by mouth daily.      Marland Kitchen atorvastatin (LIPITOR) 40 MG tablet TAKE 1 TABLET(40 MG) BY MOUTH DAILY 30 tablet 10  . azelastine (ASTELIN) 0.1 % nasal spray Place 1 spray into both nostrils 2 (two) times daily. Use in each nostril as directed (Patient taking differently: Place 1 spray into both nostrils daily. Use in each nostril as directed) 30 mL 12  . Biotin 1000 MCG tablet Take 1,000 mcg by mouth daily.     . cetirizine (ZYRTEC) 10 MG tablet Take 10 mg by mouth daily.    . Cinnamon 500 MG capsule Take 1,000 mg by mouth 2 (two) times daily.     Marland Kitchen CIPRODEX OTIC suspension INT 3 GTS INTO BOTH EARS QHS    . Coenzyme Q10 (COQ-10) 100 MG capsule Take 1 capsule (100 mg total) by mouth daily.    Marland Kitchen ezetimibe (ZETIA) 10 MG tablet Take 1 tablet (10 mg total) by mouth daily. 30 tablet 5  . furosemide (LASIX) 20 MG  tablet Take 1 tablet (20 mg total) by mouth daily. 90 tablet 3  . Ginger, Zingiber officinalis, (GINGER ROOT) 550 MG CAPS Take 1 capsule by mouth 2 (two) times daily.     Marland Kitchen levothyroxine (SYNTHROID) 112 MCG tablet Take 1 tablet (112 mcg total) by mouth at bedtime. 90 tablet 1  . losartan (COZAAR) 50 MG tablet Take 1 tablet (50 mg total) by mouth daily. 90 tablet 3  . metFORMIN (GLUCOPHAGE) 500 MG tablet Take 1 tablet (500 mg total) by mouth 2 (two) times daily with a meal. 60 tablet 5  . Multiple Vitamins-Minerals (THERAGRAN-M ADVANCED 50 PLUS PO) Take by mouth.    . Nerve Stimulator (CEFALY KIT) DEVI by Does not apply route.    Marland Kitchen OVER THE COUNTER MEDICATION Migra-eeze: Take 1 gel capsule two times daily.    . pantoprazole (PROTONIX) 40 MG tablet Take 1 tablet (40 mg total) by mouth daily. 90 tablet 4  . piroxicam (FELDENE) 10 MG capsule 1-2 daily prn 200 capsule 1  . propranolol ER (INDERAL LA) 160 MG SR capsule TAKE 1 CAPSULE(160 MG) BY MOUTH DAILY 30 capsule 5  . tamsulosin (FLOMAX) 0.4 MG CAPS capsule Take 1 capsule (0.4 mg total) by mouth daily. Reported on 09/23/2015 30 capsule   . Turmeric 500 MG CAPS Take 2 capsules by mouth 2 (two) times a  day.     No current facility-administered medications for this visit.      Past Medical History:  Diagnosis Date  . Allergy   . Arthritis   . CAD (coronary artery disease)    s/p PTCI in 2003 and 2004 to the LAD.   Marland Kitchen Cataract   . Chronic tubotympanic suppurative otitis media, bilateral   . Depression   . Diabetes mellitus   . Eustachian tube dysfunction   . History of placement of ear tubes   . Hyperlipidemia   . Hypertension   . Hypothyroid   . Internal hemorrhoids   . Migraine headache   . Skin cancer, basal cell   . Thrombocytopenia ([place])   . Thrombocytopenia, unspecified ([place]) 05/08/2013  . TIA (transient ischemic attack) 2006   per patient's report.  He was never officially given this diagnosis    Past Surgical History:   Procedure Laterality Date  . COLONOSCOPY  2011  . coronary artery disease status post placement     of drug-eluting stent in the LAD in 2003,eEF 65% then  . esophogeal dilation    . fatty tissue removed     from neck 2003  . HERNIA REPAIR  2007  . POLYPECTOMY  2011   +TA  . right toe bone spur surgery    . THYROIDECTOMY, PARTIAL  07/2002   right thyroid  . TONSILLECTOMY    . UPPER GI ENDOSCOPY      Social History   Socioeconomic History  . Marital status: Married    Spouse name: Charleen  . Number of children: 0  . Years of education: Not on file  . Highest education level: Not on file  Occupational History  . Occupation: retired    Fish farm manager: RETIRED    Comment: accountant  Social Needs  . Financial resource strain: Not on file  . Food insecurity    Worry: Not on file    Inability: Not on file  . Transportation needs    Medical: Not on file    Non-medical: Not on file  Tobacco Use  . Smoking status: Former Smoker    Years: 5.00    Types: Cigarettes    Quit date: 10/10/1965    Years since quitting: 53.7  . Smokeless tobacco: Never Used  Substance and Sexual Activity  . Alcohol use: No    Alcohol/week: 0.0 standard drinks    Comment: wine rarely  . Drug use: No  . Sexual activity: Yes    Partners: Female  Lifestyle  . Physical activity    Days per week: Not on file    Minutes per session: Not on file  . Stress: Not on file  Relationships  . Social Herbalist on phone: Not on file    Gets together: Not on file    Attends religious service: Not on file    Active member of club or organization: Not on file    Attends meetings of clubs or organizations: Not on file    Relationship status: Not on file  . Intimate partner violence    Fear of current or ex partner: Not on file    Emotionally abused: Not on file    Physically abused: Not on file    Forced sexual activity: Not on file  Other Topics Concern  . Not on file  Social History Narrative    Exercise-- treadmill, stationary bike    Family History  Problem Relation Age of Onset  . Mental  illness Mother        alzheimers  . Heart disease Mother   . Heart disease Father        triple by pass  . Hypertension Brother        Prostate Cancer  . Prostate cancer Brother   . Hypertension Brother   . Hypertension Brother   . COPD Brother   . Colon cancer Neg Hx   . Colon polyps Neg Hx   . Rectal cancer Neg Hx   . Stomach cancer Neg Hx   . Esophageal cancer Neg Hx     ROS: no fevers or chills, productive cough, hemoptysis, dysphasia, odynophagia, melena, hematochezia, dysuria, hematuria, rash, seizure activity, orthopnea, PND, pedal edema, claudication. Remaining systems are negative.  Physical Exam: Well-developed well-nourished in no acute distress.  Skin is warm and dry.  HEENT is normal.  Neck is supple.  Chest is clear to auscultation with normal expansion.  Cardiovascular exam is regular rate and rhythm.  Abdominal exam nontender or distended. No masses palpated. Extremities show 1+ edema. neuro grossly intact  ECG-normal sinus rhythm at a rate of 73, no ST changes.  Personally reviewed  A/P  1 edema-etiology of bilateral lower extremity edema unclear.  Amlodipine was discontinued previously.  Echocardiogram shows normal LV function and TSH was also normal.  I will arrange a cardiac MRI to exclude infiltrative cardiomyopathy such as amyloid.  Increase Lasix to 40 mg daily.  Check potassium and renal function in 1 week.  2 coronary artery disease-no chest pain.  Continue aspirin and statin.  3 hypertension-patient's blood pressure is controlled.  Continue present medications and follow.  4 hyperlipidemia-continue statin.  5 preoperative evaluation prior to your surgery-recent nuclear study showed no ischemia and patient is not having exertional chest pain.  He will not require further evaluation preoperatively.  He apparently will have a metal anchor placed in  his ear and we will try to arrange cardiac MRI prior to procedure.  Kirk Ruths, MD

## 2019-06-21 DIAGNOSIS — H7292 Unspecified perforation of tympanic membrane, left ear: Secondary | ICD-10-CM | POA: Diagnosis not present

## 2019-06-21 DIAGNOSIS — H6612 Chronic tubotympanic suppurative otitis media, left ear: Secondary | ICD-10-CM | POA: Diagnosis not present

## 2019-06-21 DIAGNOSIS — H90A21 Sensorineural hearing loss, unilateral, right ear, with restricted hearing on the contralateral side: Secondary | ICD-10-CM | POA: Diagnosis not present

## 2019-06-21 DIAGNOSIS — R600 Localized edema: Secondary | ICD-10-CM | POA: Diagnosis not present

## 2019-06-21 DIAGNOSIS — E119 Type 2 diabetes mellitus without complications: Secondary | ICD-10-CM | POA: Diagnosis not present

## 2019-06-21 DIAGNOSIS — Z7984 Long term (current) use of oral hypoglycemic drugs: Secondary | ICD-10-CM | POA: Diagnosis not present

## 2019-06-21 DIAGNOSIS — H90A32 Mixed conductive and sensorineural hearing loss, unilateral, left ear with restricted hearing on the contralateral side: Secondary | ICD-10-CM | POA: Diagnosis not present

## 2019-06-24 ENCOUNTER — Encounter: Payer: Self-pay | Admitting: Cardiology

## 2019-06-24 ENCOUNTER — Ambulatory Visit
Admission: RE | Admit: 2019-06-24 | Discharge: 2019-06-24 | Disposition: A | Discharge status: Home / Self Care | Payer: Medicare Other | Source: Ambulatory Visit | Attending: Cardiology | Admitting: Cardiology

## 2019-06-24 ENCOUNTER — Other Ambulatory Visit: Payer: Self-pay

## 2019-06-24 ENCOUNTER — Telehealth: Payer: Self-pay | Admitting: Cardiology

## 2019-06-24 ENCOUNTER — Ambulatory Visit (INDEPENDENT_AMBULATORY_CARE_PROVIDER_SITE_OTHER): Payer: Medicare Other | Admitting: Cardiology

## 2019-06-24 VITALS — BP 140/68 | HR 73 | Temp 97.9°F | Ht 69.0 in | Wt 179.0 lb

## 2019-06-24 DIAGNOSIS — I10 Essential (primary) hypertension: Secondary | ICD-10-CM

## 2019-06-24 DIAGNOSIS — Z01810 Encounter for preprocedural cardiovascular examination: Secondary | ICD-10-CM

## 2019-06-24 DIAGNOSIS — R609 Edema, unspecified: Secondary | ICD-10-CM | POA: Diagnosis not present

## 2019-06-24 DIAGNOSIS — Z9861 Coronary angioplasty status: Secondary | ICD-10-CM

## 2019-06-24 DIAGNOSIS — I2510 Atherosclerotic heart disease of native coronary artery without angina pectoris: Secondary | ICD-10-CM

## 2019-06-24 DIAGNOSIS — R918 Other nonspecific abnormal finding of lung field: Secondary | ICD-10-CM | POA: Diagnosis not present

## 2019-06-24 MED ORDER — FUROSEMIDE 40 MG PO TABS: 40.0000 mg | ORAL_TABLET | Freq: Every day | ORAL | 3 refills | Status: DC

## 2019-06-24 NOTE — Patient Instructions (Addendum)
Medication Instructions:  Increase Lasix to 40 mg daily  If you need a refill on your cardiac medications before your next appointment, please call your pharmacy.   Lab work: CMET, CBC, PT/PTT, BNP in 1 week If you have labs (blood work) drawn today and your tests are completely normal, you will receive your results only by: Marland Kitchen MyChart Message (if you have MyChart) OR . A paper copy in the mail If you have any lab test that is abnormal or we need to change your treatment, we will call you to review the results.  Testing/Procedures: Your physician has requested that you have a cardiac MRI. Cardiac MRI uses a computer to create images of your heart as its beating, producing both still and moving pictures of your heart and major blood vessels. For further information please visit [email or web address] Please follow the instruction sheet given to you today for more information.  Chest xray - Your physician has requested that you have a chest xray, is a fast and painless imaging test that uses certain electromagnetic waves to create pictures of the structures in and around your chest. This test can help diagnose and monitor conditions such as pneumonia and other lung issues his will be done at [facility] [address], [place]. If you should need to call them their phone number is [phone].   Follow-Up: At [facility], you and your health needs are our priority.  As part of our continuing mission to provide you with exceptional heart care, we have created designated Provider Care Teams.  These Care Teams include your primary Cardiologist (physician) and Advanced Practice Providers (APPs -  Physician Assistants and Nurse Practitioners) who all work together to provide you with the care you need, when you need it. . Follow up with Dr.Crenshaw in 3 months.

## 2019-06-25 ENCOUNTER — Encounter: Payer: Self-pay | Admitting: Family Medicine

## 2019-06-25 NOTE — Telephone Encounter (Signed)
Patient doctor was spoken to by DOD.

## 2019-06-30 DIAGNOSIS — Z23 Encounter for immunization: Secondary | ICD-10-CM | POA: Diagnosis not present

## 2019-07-01 DIAGNOSIS — I10 Essential (primary) hypertension: Secondary | ICD-10-CM | POA: Diagnosis not present

## 2019-07-01 DIAGNOSIS — R609 Edema, unspecified: Secondary | ICD-10-CM | POA: Diagnosis not present

## 2019-07-01 LAB — SPECIMEN STATUS REPORT

## 2019-07-02 ENCOUNTER — Encounter: Payer: Self-pay | Admitting: Family Medicine

## 2019-07-02 ENCOUNTER — Telehealth: Payer: Self-pay

## 2019-07-02 LAB — COMPREHENSIVE METABOLIC PANEL
ALT: 21 IU/L (ref 0–44)
AST: 31 IU/L (ref 0–40)
Albumin/Globulin Ratio: 1.6 (ref 1.2–2.2)
Albumin: 3.1 g/dL — ABNORMAL LOW (ref 3.7–4.7)
Alkaline Phosphatase: 68 IU/L (ref 39–117)
BUN/Creatinine Ratio: 19 (ref 10–24)
BUN: 25 mg/dL (ref 8–27)
Bilirubin Total: 0.4 mg/dL (ref 0.0–1.2)
CO2: 23 mmol/L (ref 20–29)
Calcium: 8.2 mg/dL — ABNORMAL LOW (ref 8.6–10.2)
Chloride: 106 mmol/L (ref 96–106)
Creatinine, Ser: 1.29 mg/dL — ABNORMAL HIGH (ref 0.76–1.27)
GFR calc Af Amer: 62 mL/min/{1.73_m2} (ref >59)
GFR calc non Af Amer: 54 mL/min/{1.73_m2} — ABNORMAL LOW (ref >59)
Globulin, Total: 1.9 g/dL (ref 1.5–4.5)
Glucose: 110 mg/dL — ABNORMAL HIGH (ref 65–99)
Potassium: 4.4 mmol/L (ref 3.5–5.2)
Sodium: 141 mmol/L (ref 134–144)
Total Protein: 5 g/dL — ABNORMAL LOW (ref 6.0–8.5)

## 2019-07-02 LAB — CBC
Hematocrit: 29.1 % — ABNORMAL LOW (ref 37.5–51.0)
Hemoglobin: 8.8 g/dL — ABNORMAL LOW (ref 13.0–17.7)
MCH: 21.6 pg — ABNORMAL LOW (ref 26.6–33.0)
MCHC: 30.2 g/dL — ABNORMAL LOW (ref 31.5–35.7)
MCV: 71 fL — ABNORMAL LOW (ref 79–97)
Platelets: 205 10*3/uL (ref 150–450)
RBC: 4.08 x10E6/uL — ABNORMAL LOW (ref 4.14–5.80)
RDW: 15.9 % — ABNORMAL HIGH (ref 11.6–15.4)
WBC: 4.3 10*3/uL (ref 3.4–10.8)

## 2019-07-02 LAB — PT AND PTT
INR: 1 (ref 0.8–1.2)
Prothrombin Time: 10.5 s (ref 9.1–12.0)
aPTT: 23 s — ABNORMAL LOW (ref 24–33)

## 2019-07-02 LAB — BRAIN NATRIURETIC PEPTIDE: BNP: 229.4 pg/mL — ABNORMAL HIGH (ref 0.0–100.0)

## 2019-07-02 NOTE — Telephone Encounter (Signed)
Copied from [place] [id number]. Topic: Appointment Scheduling - Scheduling Inquiry for Clinic >> Jul 02, 2019 12:15 PM Erick Blinks wrote: Reason for CRM: Pt needs to be scheduled for soonest available appt. Pt was advised by cardiologist to fu with PCP regarding hemoglobin lab levels  Best contact: [phone]

## 2019-07-02 NOTE — Telephone Encounter (Signed)
How is he feeling --- if he is feeling weak he should go to the ER Otherwise we can do virtual tomorrow to get more testing done

## 2019-07-03 ENCOUNTER — Other Ambulatory Visit: Payer: Self-pay | Admitting: Family Medicine

## 2019-07-03 ENCOUNTER — Other Ambulatory Visit: Payer: Self-pay

## 2019-07-03 ENCOUNTER — Encounter: Payer: Self-pay | Admitting: Family Medicine

## 2019-07-03 ENCOUNTER — Other Ambulatory Visit (INDEPENDENT_AMBULATORY_CARE_PROVIDER_SITE_OTHER): Payer: Medicare Other

## 2019-07-03 ENCOUNTER — Ambulatory Visit (INDEPENDENT_AMBULATORY_CARE_PROVIDER_SITE_OTHER): Payer: Medicare Other | Admitting: Family Medicine

## 2019-07-03 DIAGNOSIS — D649 Anemia, unspecified: Secondary | ICD-10-CM

## 2019-07-03 DIAGNOSIS — Z9861 Coronary angioplasty status: Secondary | ICD-10-CM | POA: Diagnosis not present

## 2019-07-03 DIAGNOSIS — I2510 Atherosclerotic heart disease of native coronary artery without angina pectoris: Secondary | ICD-10-CM | POA: Diagnosis not present

## 2019-07-03 LAB — COMPREHENSIVE METABOLIC PANEL
ALT: 17 U/L (ref 0–53)
AST: 19 U/L (ref 0–37)
Albumin: 3 g/dL — ABNORMAL LOW (ref 3.5–5.2)
Alkaline Phosphatase: 54 U/L (ref 39–117)
BUN: 20 mg/dL (ref 6–23)
CO2: 25 mEq/L (ref 19–32)
Calcium: 8 mg/dL — ABNORMAL LOW (ref 8.4–10.5)
Chloride: 107 mEq/L (ref 96–112)
Creatinine, Ser: 1.24 mg/dL (ref 0.40–1.50)
GFR: 56.76 mL/min — ABNORMAL LOW (ref >60.00)
Glucose, Bld: 97 mg/dL (ref 70–99)
Potassium: 4 mEq/L (ref 3.5–5.1)
Sodium: 140 mEq/L (ref 135–145)
Total Bilirubin: 0.4 mg/dL (ref 0.2–1.2)
Total Protein: 5.1 g/dL — ABNORMAL LOW (ref 6.0–8.3)

## 2019-07-03 LAB — CBC WITH DIFFERENTIAL/PLATELET
Basophils Absolute: 0.1 10*3/uL (ref 0.0–0.1)
Basophils Relative: 1.3 % (ref 0.0–3.0)
Eosinophils Absolute: 0.5 10*3/uL (ref 0.0–0.7)
Eosinophils Relative: 9.8 % — ABNORMAL HIGH (ref 0.0–5.0)
HCT: 28.2 % — ABNORMAL LOW (ref 39.0–52.0)
Hemoglobin: 8.7 g/dL — ABNORMAL LOW (ref 13.0–17.0)
Lymphocytes Relative: 14.3 % (ref 12.0–46.0)
Lymphs Abs: 0.7 10*3/uL (ref 0.7–4.0)
MCHC: 31 g/dL (ref 30.0–36.0)
MCV: 67.7 fl — ABNORMAL LOW (ref 78.0–100.0)
Monocytes Absolute: 0.5 10*3/uL (ref 0.1–1.0)
Monocytes Relative: 10.8 % (ref 3.0–12.0)
Neutro Abs: 3.2 10*3/uL (ref 1.4–7.7)
Neutrophils Relative %: 63.8 % (ref 43.0–77.0)
Platelets: 185 10*3/uL (ref 150.0–400.0)
RBC: 4.16 Mil/uL — ABNORMAL LOW (ref 4.22–5.81)
RDW: 17.2 % — ABNORMAL HIGH (ref 11.5–15.5)
WBC: 5 10*3/uL (ref 4.0–10.5)

## 2019-07-03 LAB — IBC + FERRITIN
Ferritin: 5 ng/mL — ABNORMAL LOW (ref 22.0–322.0)
Iron: 17 ug/dL — ABNORMAL LOW (ref 42–165)
Saturation Ratios: 5.2 % — ABNORMAL LOW (ref 20.0–50.0)
Transferrin: 232 mg/dL (ref 212.0–360.0)

## 2019-07-03 NOTE — Telephone Encounter (Signed)
Left VM on home phone. Message stated that if [name] was feeling weak he needed to go to the ED but if he felt fine to call back a schedule a virtual visit with Dr. Etter Sjogren for today.

## 2019-07-03 NOTE — Progress Notes (Signed)
Virtual Visit via Video Note  I connected with [name] on 07/03/19 at  9:40 AM EDT by a video enabled telemedicine application and verified that I am speaking with the correct person using two identifiers.  Location: Patient: home -with wife --- had to transition to telephone due to being unable to get the sound to work  Provider: home    I discussed the limitations of evaluation and management by telemedicine and the availability of in person appointments. The patient expressed understanding and agreed to proceed.  History of Present Illness: Pt is home c/o fatigue ---- - hgb done by cardiology was 8.8   No other complaints  No cp, no sob, no rectal bleeding or , abnormal stools  No abd pain Colon due 2021   Observations/Objective: No vitals obtained Pt in NAD   Assessment and Plan: 1. Anemia, unspecified type Check I fob Recheck labs today If hgb worse---  Pt may need to go to ER  - Fecal occult blood, imunochemical; Future - CBC with Differential/Platelet - IBC + Ferritin - Comprehensive metabolic panel   Follow Up Instructions:    I discussed the assessment and treatment plan with the patient. The patient was provided an opportunity to ask questions and all were answered. The patient agreed with the plan and demonstrated an understanding of the instructions.   The patient was advised to call back or seek an in-person evaluation if the symptoms worsen or if the condition fails to improve as anticipated.  I provided 15 minutes of non-face-to-face time during this encounter.   [name], DO

## 2019-07-03 NOTE — Addendum Note (Signed)
Addended by: Sanda Linger on: 07/03/2019 10:43 AM   Modules accepted: Orders

## 2019-07-04 ENCOUNTER — Other Ambulatory Visit (INDEPENDENT_AMBULATORY_CARE_PROVIDER_SITE_OTHER): Payer: Medicare Other

## 2019-07-04 ENCOUNTER — Ambulatory Visit (INDEPENDENT_AMBULATORY_CARE_PROVIDER_SITE_OTHER): Payer: Medicare Other | Admitting: Gastroenterology

## 2019-07-04 ENCOUNTER — Other Ambulatory Visit: Payer: Self-pay

## 2019-07-04 ENCOUNTER — Encounter: Payer: Self-pay | Admitting: Gastroenterology

## 2019-07-04 VITALS — BP 136/70 | HR 75 | Temp 98.8°F | Ht 69.0 in | Wt 174.0 lb

## 2019-07-04 DIAGNOSIS — Z9861 Coronary angioplasty status: Secondary | ICD-10-CM

## 2019-07-04 DIAGNOSIS — D649 Anemia, unspecified: Secondary | ICD-10-CM | POA: Diagnosis not present

## 2019-07-04 DIAGNOSIS — D509 Iron deficiency anemia, unspecified: Secondary | ICD-10-CM

## 2019-07-04 DIAGNOSIS — K2100 Gastro-esophageal reflux disease with esophagitis, without bleeding: Secondary | ICD-10-CM

## 2019-07-04 DIAGNOSIS — I2510 Atherosclerotic heart disease of native coronary artery without angina pectoris: Secondary | ICD-10-CM

## 2019-07-04 LAB — FECAL OCCULT BLOOD, IMMUNOCHEMICAL: Fecal Occult Bld: POSITIVE — AB

## 2019-07-04 MED ORDER — NA SULFATE-K SULFATE-MG SULF 17.5-3.13-1.6 GM/177ML PO SOLN: 1.0000 | Freq: Once | ORAL | 0 refills | Status: AC

## 2019-07-04 NOTE — Patient Instructions (Signed)
You have been scheduled for a colonoscopy. Please follow written instructions given to you at your visit today.  Please pick up your prep supplies at the pharmacy within the next 1-3 days. If you use inhalers (even only as needed), please bring them with you on the day of your procedure. Your physician has requested that you go to www.startemmi.com and enter the access code given to you at your visit today. This web site gives a general overview about your procedure. However, you should still follow specific instructions given to you by our office regarding your preparation for the procedure.  Thank you for choosing me and [facility].  Malcolm T. Stark, Jr., MD., FACG  

## 2019-07-04 NOTE — Progress Notes (Signed)
    History of Present Illness: This is a 73 year old male with new iron deficiency anemia.  His reflux symptoms are well controlled and he states he remains on pantoprazole daily.  He notes intermittent difficulty swallowing large capsules but no solid food dysphagia. Denies weight loss, abdominal pain, constipation, diarrhea, change in stool caliber, melena, hematochezia, nausea, vomiting, chest pain.   EGD 11/2018 - LA Grade A reflux esophagitis. - Benign-appearing esophageal stenosis. Dilated. - Non-bleeding erosive gastropathy. Biopsied. - Medium-sized hiatal hernia. - Erosive duodenitis.  Current Medications, Allergies, Past Medical History, Past Surgical History, Family History and Social History were reviewed in [employer] record.   Physical Exam: General: Well developed, well nourished, no acute distress Head: Normocephalic and atraumatic Eyes:  sclerae anicteric, EOMI Ears: Normal auditory acuity Mouth: No deformity or lesions Lungs: Clear throughout to auscultation Heart: Regular rate and rhythm; no murmurs, rubs or bruits Abdomen: Soft, non tender and non distended. No masses, hepatosplenomegaly or hernias noted. Normal Bowel sounds Rectal: Deferred to colonoscopy Musculoskeletal: Symmetrical with no gross deformities  Pulses:  Normal pulses noted Extremities: No clubbing, cyanosis, edema or deformities noted Neurological: Alert oriented x 4, grossly nonfocal Psychological:  Alert and cooperative. Normal mood and affect   Assessment and Recommendations:  1. Iron deficiency anemia. FeSO4 325 mg po qd. R/O colorectal lesions. Blood loss from erosive gastris, duodenitis, esophagitis is possible.  Advised to remain on pantoprazole daily long-term.  Schedule colonoscopy the risks (including bleeding, perforation, infection, missed lesions, medication reactions and possible hospitalization or surgery if complications occur), benefits, and alternatives to  colonoscopy with possible biopsy and possible polypectomy were discussed with the patient and they consent to proceed.   2.  GERD with LA Class A esophagitis and a stricture.  Intermittent difficulties swallowing large capsules but not solid foods.  Offered repeat EGD with dilation however he declines.  Follow standard antireflux measures.  Continue pantoprazole 40 mg daily.

## 2019-07-07 ENCOUNTER — Other Ambulatory Visit: Payer: Self-pay | Admitting: Family Medicine

## 2019-07-07 DIAGNOSIS — IMO0002 Reserved for concepts with insufficient information to code with codable children: Secondary | ICD-10-CM

## 2019-07-07 DIAGNOSIS — E1151 Type 2 diabetes mellitus with diabetic peripheral angiopathy without gangrene: Secondary | ICD-10-CM

## 2019-07-08 ENCOUNTER — Telehealth: Payer: Self-pay

## 2019-07-08 NOTE — Telephone Encounter (Signed)
Covid-19 screening questions   Do you now or have you had a fever in the last 14 days?  Do you have any respiratory symptoms of shortness of breath or cough now or in the last 14 days?  Do you have any family members or close contacts with diagnosed or suspected Covid-19 in the past 14 days?  Have you been tested for Covid-19 and found to be positive?       

## 2019-07-08 NOTE — Telephone Encounter (Signed)
Pt responded "no" to all screening questions °

## 2019-07-09 ENCOUNTER — Encounter: Payer: Self-pay | Admitting: Gastroenterology

## 2019-07-09 ENCOUNTER — Telehealth (HOSPITAL_COMMUNITY): Payer: Self-pay | Admitting: Emergency Medicine

## 2019-07-09 ENCOUNTER — Other Ambulatory Visit: Payer: Self-pay

## 2019-07-09 ENCOUNTER — Ambulatory Visit (AMBULATORY_SURGERY_CENTER): Payer: Medicare Other | Admitting: Gastroenterology

## 2019-07-09 VITALS — BP 155/71 | HR 73 | Temp 98.3°F | Resp 19 | Ht 69.0 in | Wt 174.0 lb

## 2019-07-09 DIAGNOSIS — Z1211 Encounter for screening for malignant neoplasm of colon: Secondary | ICD-10-CM | POA: Diagnosis not present

## 2019-07-09 DIAGNOSIS — D509 Iron deficiency anemia, unspecified: Secondary | ICD-10-CM

## 2019-07-09 DIAGNOSIS — K635 Polyp of colon: Secondary | ICD-10-CM | POA: Diagnosis not present

## 2019-07-09 DIAGNOSIS — K640 First degree hemorrhoids: Secondary | ICD-10-CM | POA: Diagnosis not present

## 2019-07-09 DIAGNOSIS — R195 Other fecal abnormalities: Secondary | ICD-10-CM | POA: Diagnosis not present

## 2019-07-09 DIAGNOSIS — K5730 Diverticulosis of large intestine without perforation or abscess without bleeding: Secondary | ICD-10-CM

## 2019-07-09 DIAGNOSIS — D120 Benign neoplasm of cecum: Secondary | ICD-10-CM | POA: Diagnosis not present

## 2019-07-09 DIAGNOSIS — I2510 Atherosclerotic heart disease of native coronary artery without angina pectoris: Secondary | ICD-10-CM | POA: Diagnosis not present

## 2019-07-09 DIAGNOSIS — Z86010 Personal history of colonic polyps: Secondary | ICD-10-CM

## 2019-07-09 MED ORDER — SODIUM CHLORIDE 0.9 % IV SOLN: 500.0000 mL | Freq: Once | INTRAVENOUS | Status: DC

## 2019-07-09 NOTE — Patient Instructions (Signed)
Thank you for letting us take care of your healthcare needs today. Please see handouts given to you on Polyps, Diverticulosis and Hemorrhoids.    YOU HAD AN ENDOSCOPIC PROCEDURE TODAY AT [facility]:   Refer to the procedure report that was given to you for any specific questions about what was found during the examination.  If the procedure report does not answer your questions, please call your gastroenterologist to clarify.  If you requested that your care partner not be given the details of your procedure findings, then the procedure report has been included in a sealed envelope for you to review at your convenience later.  YOU SHOULD EXPECT: Some feelings of bloating in the abdomen. Passage of more gas than usual.  Walking can help get rid of the air that was put into your GI tract during the procedure and reduce the bloating. If you had a lower endoscopy (such as a colonoscopy or flexible sigmoidoscopy) you may notice spotting of blood in your stool or on the toilet paper. If you underwent a bowel prep for your procedure, you may not have a normal bowel movement for a few days.  Please Note:  You might notice some irritation and congestion in your nose or some drainage.  This is from the oxygen used during your procedure.  There is no need for concern and it should clear up in a day or so.  SYMPTOMS TO REPORT IMMEDIATELY:   Following lower endoscopy (colonoscopy or flexible sigmoidoscopy):  Excessive amounts of blood in the stool  Significant tenderness or worsening of abdominal pains  Swelling of the abdomen that is new, acute  Fever of 100F or higher   For urgent or emergent issues, a gastroenterologist can be reached at any hour by calling [phone].   DIET:  We do recommend a small meal at first, but then you may proceed to your regular diet.  Drink plenty of fluids but you should avoid alcoholic beverages for 24 hours.  ACTIVITY:  You should plan to take it  easy for the rest of today and you should NOT DRIVE or use heavy machinery until tomorrow (because of the sedation medicines used during the test).    FOLLOW UP: Our staff will call the number listed on your records 48-72 hours following your procedure to check on you and address any questions or concerns that you may have regarding the information given to you following your procedure. If we do not reach you, we will leave a message.  We will attempt to reach you two times.  During this call, we will ask if you have developed any symptoms of COVID 19. If you develop any symptoms (ie: fever, flu-like symptoms, shortness of breath, cough etc.) before then, please call [phone].  If you test positive for Covid 19 in the 2 weeks post procedure, please call and report this information to [place].    If any biopsies were taken you will be contacted by phone or by letter within the next 1-3 weeks.  Please call us at [phone] if you have not heard about the biopsies in 3 weeks.    SIGNATURES/CONFIDENTIALITY: You and/or your care partner have signed paperwork which will be entered into your electronic medical record.  These signatures attest to the fact that that the information above on your After Visit Summary has been reviewed and is understood.  Full responsibility of the confidentiality of this discharge information lies with you and/or your care-partner.

## 2019-07-09 NOTE — Progress Notes (Signed)
Report given to PACU, vss 

## 2019-07-09 NOTE — Progress Notes (Signed)
Called to room to assist during endoscopic procedure.  Patient ID and intended procedure confirmed with present staff. Received instructions for my participation in the procedure from the performing physician.  

## 2019-07-09 NOTE — Telephone Encounter (Signed)
Left message on voicemail with name and callback number Van Seymore RN Navigator Cardiac Imaging [facility] [phone] Office [phone] Cell  

## 2019-07-09 NOTE — Op Note (Addendum)
[facility] Patient Name: [name] Procedure Date: 07/09/2019 2:22 PM MRN: [id number] Endoscopist: Ladene Artist , MD Age: 73 Referring MD:  Date of Birth: 1945-12-31 Gender: Male Account #: [id number] Procedure:                Colonoscopy Indications:              Positive fecal immunochemical test, Iron deficiency                            anemia. Personal history of adenomatous colon                            polyps . Medicines:                Monitored Anesthesia Care Procedure:                Pre-Anesthesia Assessment:                           - Prior to the procedure, a History and Physical                            was performed, and patient medications and                            allergies were reviewed. The patient's tolerance of                            previous anesthesia was also reviewed. The risks                            and benefits of the procedure and the sedation                            options and risks were discussed with the patient.                            All questions were answered, and informed consent                            was obtained. Prior Anticoagulants: The patient has                            taken no previous anticoagulant or antiplatelet                            agents. ASA Grade Assessment: III - A patient with                            severe systemic disease. After reviewing the risks                            and benefits, the patient was deemed in  satisfactory condition to undergo the procedure.                           After obtaining informed consent, the colonoscope                            was passed under direct vision. Throughout the                            procedure, the patient's blood pressure, pulse, and                            oxygen saturations were monitored continuously. The                            Colonoscope was introduced through the anus and                         advanced to the the cecum, identified by                            appendiceal orifice and ileocecal valve. The                            ileocecal valve, appendiceal orifice, and rectum                            were photographed. The quality of the bowel                            preparation was good. The colonoscopy was performed                            without difficulty. The patient tolerated the                            procedure well. Scope In: 2:32:43 PM Scope Out: 2:47:06 PM Scope Withdrawal Time: 0 hours 10 minutes 44 seconds  Total Procedure Duration: 0 hours 14 minutes 23 seconds  Findings:                 The perianal and digital rectal examinations were                            normal.                           A 7 mm polyp was found in the cecum. The polyp was                            sessile. The polyp was removed with a cold snare.                            Resection and retrieval were complete.  There was a medium-sized lipoma, 15 mm in diameter,                            in the transverse colon.                           A few small-mouthed diverticula were found in the                            left colon. There was no evidence of diverticular                            bleeding.                           Internal hemorrhoids were found during                            retroflexion. The hemorrhoids were small and Grade                            I (internal hemorrhoids that do not prolapse).                           The exam was otherwise without abnormality on                            direct and retroflexion views. Complications:            No immediate complications. Estimated blood loss:                            None. Estimated Blood Loss:     Estimated blood loss: none. Impression:               - One 7 mm polyp in the cecum, removed with a cold                            snare. Resected and  retrieved.                           - Lipoma, transverse colon.                           - Mild diverticulosis in the left colon.                           - Internal hemorrhoids.                           - The examination was otherwise normal on direct                            and retroflexion views. Recommendation:           - Patient has a contact number available for  emergencies. The signs and symptoms of potential                            delayed complications were discussed with the                            patient. Return to normal activities tomorrow.                            Written discharge instructions were provided to the                            patient.                           - Resume previous diet.                           - Continue present medications.                           - Await pathology results.                           - No findings to clearly explain iron deficiency                            and FIT positive stool. Perform video capsule                            endoscopy at the next available appointment. Ladene Artist, MD 07/09/2019 2:52:25 PM This report has been signed electronically.

## 2019-07-11 ENCOUNTER — Ambulatory Visit (HOSPITAL_COMMUNITY)
Admission: RE | Admit: 2019-07-11 | Discharge: 2019-07-11 | Disposition: A | Discharge status: Home / Self Care | Payer: Medicare Other | Source: Ambulatory Visit | Attending: Cardiology | Admitting: Cardiology

## 2019-07-11 ENCOUNTER — Telehealth: Payer: Self-pay

## 2019-07-11 ENCOUNTER — Telehealth: Payer: Self-pay | Admitting: *Deleted

## 2019-07-11 ENCOUNTER — Other Ambulatory Visit: Payer: Self-pay

## 2019-07-11 DIAGNOSIS — I10 Essential (primary) hypertension: Secondary | ICD-10-CM | POA: Insufficient documentation

## 2019-07-11 DIAGNOSIS — I5189 Other ill-defined heart diseases: Secondary | ICD-10-CM | POA: Diagnosis not present

## 2019-07-11 DIAGNOSIS — R609 Edema, unspecified: Secondary | ICD-10-CM | POA: Diagnosis not present

## 2019-07-11 DIAGNOSIS — R195 Other fecal abnormalities: Secondary | ICD-10-CM

## 2019-07-11 DIAGNOSIS — D509 Iron deficiency anemia, unspecified: Secondary | ICD-10-CM

## 2019-07-11 MED ORDER — GADOBUTROL 1 MMOL/ML IV SOLN
10.0000 mL | Freq: Once | INTRAVENOUS | Status: AC | PRN
  Administered 2019-07-11: 10 mL via INTRAVENOUS

## 2019-07-11 NOTE — Telephone Encounter (Signed)
1. Have you developed a fever since your procedure? no  2.   Have you had an respiratory symptoms (SOB or cough) since your procedure? no  3.   Have you tested positive for COVID 19 since your procedure no  4.   Have you had any family members/close contacts diagnosed with the COVID 19 since your procedure?  no   If yes to any of these questions please route to Joylene John, RN and Alphonsa Gin, Therapist, sports.   Follow up Call-  Call back number 07/09/2019 11/21/2018  Post procedure Call Back phone  # [phone]  Permission to leave phone message Yes Yes  Some recent data might be hidden     Patient questions:  Do you have a fever, pain , or abdominal swelling? No. Pain Score  0 *  Have you tolerated food without any problems? Yes.    Have you been able to return to your normal activities? Yes.    Do you have any questions about your discharge instructions: Diet   No. Medications  No. Follow up visit  No.  Do you have questions or concerns about your Care? No.  Actions: * If pain score is 4 or above: No action needed, pain <4.

## 2019-07-11 NOTE — Telephone Encounter (Signed)
Per the 9/29 colon report the pt needs to have video capsule endoscopy next available appointment.  IDA and FIT positive stool

## 2019-07-16 ENCOUNTER — Encounter: Payer: Self-pay | Admitting: Gastroenterology

## 2019-07-16 NOTE — Addendum Note (Signed)
Addended by: Marlon Pel on: 07/16/2019 01:08 PM   Modules accepted: Orders

## 2019-07-16 NOTE — Telephone Encounter (Signed)
Left message for patient to call back  

## 2019-07-16 NOTE — Telephone Encounter (Signed)
Patient has been scheduled for 07/22/19 8:30.  He is aware I will send him his instructions via MyChart.  He will call for any questions.

## 2019-07-22 ENCOUNTER — Other Ambulatory Visit: Payer: Self-pay

## 2019-07-22 ENCOUNTER — Encounter: Payer: Self-pay | Admitting: Gastroenterology

## 2019-07-22 ENCOUNTER — Ambulatory Visit (INDEPENDENT_AMBULATORY_CARE_PROVIDER_SITE_OTHER): Payer: Medicare Other | Admitting: Gastroenterology

## 2019-07-22 DIAGNOSIS — D509 Iron deficiency anemia, unspecified: Secondary | ICD-10-CM | POA: Diagnosis not present

## 2019-07-22 NOTE — Progress Notes (Signed)
Video Capsule Endo SN: [id number] LOT: [id number] EXP: 2020-08-23  Patient arrived to [facility]. Reported his prep was completed and successful. Patient successfully swallowed capsule without any problems or difficulty. Ensured the capsule was blinking before swallowing. Patient verbalized understanding pertaining to the instructions of how to retrieve the capsule. Instructions visually demonstrated. Patient given bag containing all retrieval supplies.

## 2019-08-06 ENCOUNTER — Telehealth: Payer: Self-pay

## 2019-08-06 ENCOUNTER — Other Ambulatory Visit (INDEPENDENT_AMBULATORY_CARE_PROVIDER_SITE_OTHER): Payer: Medicare Other

## 2019-08-06 ENCOUNTER — Other Ambulatory Visit: Payer: Self-pay | Admitting: Neurology

## 2019-08-06 DIAGNOSIS — D509 Iron deficiency anemia, unspecified: Secondary | ICD-10-CM | POA: Diagnosis not present

## 2019-08-06 LAB — IGA: IgA: 108 mg/dL (ref 68–378)

## 2019-08-06 NOTE — Telephone Encounter (Signed)
Requested Prescriptions   Pending Prescriptions Disp Refills  . propranolol ER (INDERAL LA) 160 MG SR capsule [Pharmacy Med Name: PROPRANOLOL ER 160MG  CAPSULES] 30 capsule 5    Sig: TAKE 1 CAPSULE(160 MG) BY MOUTH DAILY   Rx last filled:07/30/18 #30 5 refills  Pt last seen: 01/29/19  Follow up appt scheduled:08/08/19

## 2019-08-06 NOTE — Telephone Encounter (Signed)
Patient notified of the results of the capsule endoscopy  He will come in and have TTG, IGA

## 2019-08-07 LAB — TISSUE TRANSGLUTAMINASE, IGA: (tTG) Ab, IgA: 1 U/mL

## 2019-08-07 NOTE — Progress Notes (Signed)
Virtual Visit via Video Note The purpose of this virtual visit is to provide medical care while limiting exposure to the novel coronavirus.    Consent was obtained for video visit:  Yes.   Answered questions that patient had about telehealth interaction:  Yes.   I discussed the limitations, risks, security and privacy concerns of performing an evaluation and management service by telemedicine. I also discussed with the patient that there may be a patient responsible charge related to this service. The patient expressed understanding and agreed to proceed.  Pt location: Home Physician Location: Home Name of referring provider:  [name], * I connected with [name] at patients initiation/request on 08/08/2019 at 10:10 AM EDT by video enabled telemedicine application and verified that I am speaking with the correct person using two identifiers. Pt MRN:  [id number] Pt DOB:  [birth year] Video Participants:  [name]; his wife   History of Present Illness:  [name] a 73 year old right-handed male with osteoarthritis, hypertension, type 2 diabetes mellitus and hyperlipidemia who follows up for migraine and essential tremor.   UPDATE: I Migraine:  At last visit in April, migraines had been well controlled.  In the last week of June, he started having increased migraine frequency.  His cardiologist started him on amlodipine which was subsequently switched to isosorbide mononitrate due to lower extremity swelling, which reportedly increased migraines.  So medication was switched to furosemide.  He was believed to be dehydrated, so furosemide was stopped.  They are now usually once every other day to 2 a day.  One time, he had one 3 times in a day.  Cefaly helps, lasting the hour.  She was found to have severe iron deficiency anemia. GI workup found gastric  and duodenial erosion.  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  Frequency of abortive  medication:infrequent Rescue protocol: Ginger tea with Tylenol Current NSAIDS:piroxiicam daily (for arthritis) Current analgesics:no Current triptans:no Current ergotamine:no Current anti-emetic:no Current muscle relaxants:no Current anti-anxiolytic:no Current sleep aide:no Current Antihypertensive medications:Losartan-HCTZ, propranolol ER 18m Current Antidepressant medications:no Current Anticonvulsant medications:no Current anti-CGRP:no Current Vitamins/Herbal/Supplements:Ginger tea, biotin, multivitamin, fish oil, coenzyme Q 10, cinnamon, gingerroot, turmeric 500 mg, magnesium 250 mg, riboflavin 400 mg, butterbur 150 mg Current Antihistamines/Decongestants:Flonase Other therapy:Cephaly.  Treat acutely with ginger tea and rest.  Caffeine:2 to 3 cups of coffee daily Alcohol:No Smoker:No Diet:Hydrates. No soda. Exercise:Yes Depression:Stable; Anxiety:Stable Other pain:no Sleep hygiene:He wakes up every hour to urinate  II Essential Tremor: He is taking propranolol ER 160 mg daily. Tremors are stable  HISTORY: I Migraine:  Onset: In his 215sor 30s. They would occur once or twice a year. In September, they started to occur once or twice a week and then almost daily. Over the past couple of weeks (following tympanostomy tube placement), they have occurred about once a week. Location: Top of head Quality: explode Initial Intensity: 10/10 Aura: Flashing lights/colors in vision Prodrome: no Postdrome: Hangover effect for up to 5 days Associated symptoms: Nausea, photophobia, phonophobia. Denies unilateral numbness and weakness. Initial Duration: Several hours Initial Frequency: In September, they started to occur once or twice a week and then almost daily. Over the past couple of weeks (following tympanostomy tube placement), they have occurred about once a week. Triggers: Emotional stress, sound, NTG, light, wine, eye  strain Relieving factors: Sleep Activity: Needs to lay down  Past NSAIDS: Celebrex Past analgesics: no Past abortive triptans: Sumatriptan (stopped due to CAD). Past muscle relaxants:  no Past anti-emetic: no Past antihypertensive medications: none Past antidepressant medications: Prozac, amitriptyline Past anticonvulsant medications: no Past vitamins/Herbal/Supplements: no Other past therapies: Biofeedback, relaxation therapy  MRI of brain without contrast from 08/11/16 was personally reviewed and revealed mild chronic small vessel ischemic changes but no acute stroke, bleed or mass lesion. However, it did reveal mild mastoiditis. He saw ENT and had tympanostomy tube placement on 09/22/16.   IIEssential Tremor: For over 10 years, he has had a tremor, particularly in his right hand. It has become more noticeable. 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 There is no known family history.  He finds it difficult to swallow the propranolol and wonders if there is a coated version.  Past Medical History: Past Medical History:  Diagnosis Date  . Allergy   . Arthritis   . CAD (coronary artery disease)    s/p PTCI in 2003 and 2004 to the LAD.   Marland Kitchen Cataract   . Chronic tubotympanic suppurative otitis media, bilateral   . Depression   . Diabetes mellitus   . Eustachian tube dysfunction   . History of placement of ear tubes   . Hyperlipidemia   . Hypertension   . Hypothyroid   . Internal hemorrhoids   . Migraine headache   . Skin cancer, basal cell   . Thrombocytopenia ([place])   . Thrombocytopenia, unspecified ([place]) 05/08/2013  . TIA (transient ischemic attack) 2006   per patient's report.  He was never officially given this diagnosis    Medications: Outpatient Encounter Medications as of 08/08/2019  Medication Sig  . aspirin 81 MG tablet Take 81 mg by mouth daily.    Marland Kitchen atorvastatin (LIPITOR) 40 MG tablet TAKE 1 TABLET(40 MG) BY MOUTH  DAILY  . azelastine (ASTELIN) 0.1 % nasal spray Place 1 spray into both nostrils 2 (two) times daily. Use in each nostril as directed (Patient taking differently: Place 1 spray into both nostrils daily. Use in each nostril as directed)  . Biotin 1000 MCG tablet Take 1,000 mcg by mouth daily.   . cetirizine (ZYRTEC) 10 MG tablet Take 10 mg by mouth daily.  . Cinnamon 500 MG capsule Take 1,000 mg by mouth 2 (two) times daily.   Marland Kitchen CIPRODEX OTIC suspension INT 3 GTS INTO BOTH EARS QHS  . Coenzyme Q10 (COQ-10) 100 MG capsule Take 1 capsule (100 mg total) by mouth daily.  Marland Kitchen ezetimibe (ZETIA) 10 MG tablet Take 1 tablet (10 mg total) by mouth daily.  . ferrous sulfate 325 (65 FE) MG tablet Take 325 mg by mouth daily with breakfast.  . furosemide (LASIX) 40 MG tablet Take 1 tablet (40 mg total) by mouth daily.  . Ginger, Zingiber officinalis, (GINGER ROOT) 550 MG CAPS Take 1 capsule by mouth 2 (two) times daily.   Marland Kitchen levothyroxine (SYNTHROID) 112 MCG tablet Take 1 tablet (112 mcg total) by mouth at bedtime.  Marland Kitchen losartan (COZAAR) 50 MG tablet Take 1 tablet (50 mg total) by mouth daily.  . metFORMIN (GLUCOPHAGE) 500 MG tablet TAKE 1 TABLET(500 MG) BY MOUTH TWICE DAILY WITH A MEAL  . Multiple Vitamins-Minerals (THERAGRAN-M ADVANCED 50 PLUS PO) Take by mouth.  . Nerve Stimulator (CEFALY KIT) DEVI by Does not apply route.  Marland Kitchen OVER THE COUNTER MEDICATION Migra-eeze: Take 1 gel capsule two times daily.  . pantoprazole (PROTONIX) 40 MG tablet Take 1 tablet (40 mg total) by mouth daily.  . piroxicam (FELDENE) 10 MG capsule 1-2 daily prn  .  propranolol ER (INDERAL LA) 160 MG SR capsule TAKE 1 CAPSULE(160 MG) BY MOUTH DAILY  . tamsulosin (FLOMAX) 0.4 MG CAPS capsule Take 1 capsule (0.4 mg total) by mouth daily. Reported on 09/23/2015  . Turmeric 500 MG CAPS Take 2 capsules by mouth 2 (two) times a day.   No facility-administered encounter medications on file as of 08/08/2019.     Allergies: Allergies  Allergen  Reactions  . Sulfa Antibiotics Other (See Comments)  . Sulfamethoxazole-Trimethoprim Nausea And Vomiting  . Sulfasalazine Other (See Comments)  . Codeine   . Crestor [Rosuvastatin Calcium]   . Hydrocodone-Acetaminophen     vicodin   . Other     Apples and tomatoes...... Nausea and migraines.    . Rosuvastatin     REACTION: REALLY BAD JOINT/MUSCLE PAIN    Family History: Family History  Problem Relation Age of Onset  . Mental illness Mother        alzheimers  . Heart disease Mother   . Heart disease Father        triple by pass  . Hypertension Brother        Prostate Cancer  . Prostate cancer Brother   . Hypertension Brother   . Hypertension Brother   . COPD Brother   . Colon cancer Neg Hx   . Colon polyps Neg Hx   . Rectal cancer Neg Hx   . Stomach cancer Neg Hx   . Esophageal cancer Neg Hx     Social History: Social History   Socioeconomic History  . Marital status: Married    Spouse name: Charleen  . Number of children: 0  . Years of education: Not on file  . Highest education level: Not on file  Occupational History  . Occupation: retired    Fish farm manager: RETIRED    Comment: accountant  Social Needs  . Financial resource strain: Not on file  . Food insecurity    Worry: Not on file    Inability: Not on file  . Transportation needs    Medical: Not on file    Non-medical: Not on file  Tobacco Use  . Smoking status: Former Smoker    Years: 5.00    Types: Cigarettes    Quit date: 10/10/1965    Years since quitting: 53.8  . Smokeless tobacco: Never Used  Substance and Sexual Activity  . Alcohol use: No    Alcohol/week: 0.0 standard drinks    Comment: wine rarely  . Drug use: No  . Sexual activity: Yes    Partners: Female  Lifestyle  . Physical activity    Days per week: Not on file    Minutes per session: Not on file  . Stress: Not on file  Relationships  . Social Herbalist on phone: Not on file    Gets together: Not on file     Attends religious service: Not on file    Active member of club or organization: Not on file    Attends meetings of clubs or organizations: Not on file    Relationship status: Not on file  . Intimate partner violence    Fear of current or ex partner: Not on file    Emotionally abused: Not on file    Physically abused: Not on file    Forced sexual activity: Not on file  Other Topics Concern  . Not on file  Social History Narrative   Exercise-- treadmill, stationary bike    Observations/Objective:   Height  _0  (1.753 m), weight 175 lb (79.4 kg). No acute distress.  Alert and oriented.  Speech fluent and not dysarthric.  Language intact.  Eyes orthophoric on primary gaze.  Face symmetric.  Assessment and Plan:   1.  Migraine with aura, without status migrainosus, not intractable.  Increased frequency, possibly related to physical stressors on his health. 2.  Essential tremor, stable  1.  For preventative management, I would ideally like to start a CGRP inhibitor, however it is expensive with Medicare.  I would not use Depakote given its potential to decrease platelet count and he already is suspected of having a bleed.  I would like to consider topiramate or venlafaxine, however there reportedly is increased risk for bleeding (although not common).  I would like to contact Dr. Carollee Herter (PCP) and Dr. Fuller Plan (GI) to see if they have any objection to starting that medication. 2.  For abortive therapy, Cefaly 3. Propranolol for tremor and migraine 4.  Limit use of pain relievers to no more than 2 days out of week to prevent risk of rebound or medication-overuse headache. 5.  Keep headache diary 6.  Follow up 3 to 4 months   Follow Up Instructions:    -I discussed the assessment and treatment plan with the patient. The patient was provided an opportunity to ask questions and all were answered. The patient agreed with the plan and demonstrated an understanding of the instructions.   The  patient was advised to call back or seek an in-person evaluation if the symptoms worsen or if the condition fails to improve as anticipated.   Dudley Major, DO

## 2019-08-08 ENCOUNTER — Encounter: Payer: Self-pay | Admitting: Family Medicine

## 2019-08-08 ENCOUNTER — Other Ambulatory Visit: Payer: Self-pay

## 2019-08-08 ENCOUNTER — Encounter: Payer: Self-pay | Admitting: Neurology

## 2019-08-08 ENCOUNTER — Telehealth (INDEPENDENT_AMBULATORY_CARE_PROVIDER_SITE_OTHER): Payer: Medicare Other | Admitting: Neurology

## 2019-08-08 VITALS — Ht 69.0 in | Wt 175.0 lb

## 2019-08-08 DIAGNOSIS — G250 Essential tremor: Secondary | ICD-10-CM

## 2019-08-08 DIAGNOSIS — G43109 Migraine with aura, not intractable, without status migrainosus: Secondary | ICD-10-CM | POA: Diagnosis not present

## 2019-08-13 ENCOUNTER — Encounter: Payer: Self-pay | Admitting: Family Medicine

## 2019-08-13 ENCOUNTER — Other Ambulatory Visit: Payer: Self-pay | Admitting: Family Medicine

## 2019-08-13 DIAGNOSIS — D649 Anemia, unspecified: Secondary | ICD-10-CM

## 2019-08-13 NOTE — Telephone Encounter (Signed)
Order is in.

## 2019-08-14 ENCOUNTER — Other Ambulatory Visit (INDEPENDENT_AMBULATORY_CARE_PROVIDER_SITE_OTHER): Payer: Medicare Other

## 2019-08-14 DIAGNOSIS — D649 Anemia, unspecified: Secondary | ICD-10-CM | POA: Diagnosis not present

## 2019-08-14 LAB — CBC WITH DIFFERENTIAL/PLATELET
Basophils Absolute: 0.1 10*3/uL (ref 0.0–0.1)
Basophils Relative: 0.9 % (ref 0.0–3.0)
Eosinophils Absolute: 0.6 10*3/uL (ref 0.0–0.7)
Eosinophils Relative: 10.1 % — ABNORMAL HIGH (ref 0.0–5.0)
HCT: 31.4 % — ABNORMAL LOW (ref 39.0–52.0)
Hemoglobin: 9.6 g/dL — ABNORMAL LOW (ref 13.0–17.0)
Lymphocytes Relative: 13.2 % (ref 12.0–46.0)
Lymphs Abs: 0.8 10*3/uL (ref 0.7–4.0)
MCHC: 30.7 g/dL (ref 30.0–36.0)
MCV: 72.2 fl — ABNORMAL LOW (ref 78.0–100.0)
Monocytes Absolute: 0.6 10*3/uL (ref 0.1–1.0)
Monocytes Relative: 10.5 % (ref 3.0–12.0)
Neutro Abs: 3.8 10*3/uL (ref 1.4–7.7)
Neutrophils Relative %: 65.3 % (ref 43.0–77.0)
Platelets: 207 10*3/uL (ref 150.0–400.0)
RBC: 4.34 Mil/uL (ref 4.22–5.81)
RDW: 24.1 % — ABNORMAL HIGH (ref 11.5–15.5)
WBC: 5.8 10*3/uL (ref 4.0–10.5)

## 2019-08-14 LAB — IBC + FERRITIN
Ferritin: 10.1 ng/mL — ABNORMAL LOW (ref 22.0–322.0)
Iron: 98 ug/dL (ref 42–165)
Saturation Ratios: 34 % (ref 20.0–50.0)
Transferrin: 206 mg/dL — ABNORMAL LOW (ref 212.0–360.0)

## 2019-08-16 ENCOUNTER — Telehealth: Payer: Self-pay

## 2019-08-16 MED ORDER — TOPIRAMATE 25 MG PO TABS: 25.0000 mg | ORAL_TABLET | Freq: Every day | ORAL | 3 refills | Status: DC

## 2019-08-16 NOTE — Telephone Encounter (Signed)
-----   Message from Pieter Partridge, DO sent at 08/16/2019 10:40 AM EST ----- I heard back from Dr. Fuller Plan.  He said he has no objection to start either topiramate or venlafaxine.  I would like to start [name] on topiramate 25mg  at bedtime.  If headaches not improved in 4 weeks, then he should contact us and we can increase dose.  Side effects may include numbness and tingling, so he should not be concerned if he experiences this (it is just a medication side effect) and it usually resolves once his body gets used to the medication.  He should hydrate as there is a small risk for kidney stones.

## 2019-08-16 NOTE — Telephone Encounter (Signed)
Called patient no answer left message to call office back to discuss message below

## 2019-08-16 NOTE — Telephone Encounter (Signed)
Pt called back he was informed of provider response. He agrees and will start medication  rx sent to pharmacy.

## 2019-09-18 DIAGNOSIS — R972 Elevated prostate specific antigen [PSA]: Secondary | ICD-10-CM | POA: Diagnosis not present

## 2019-09-18 NOTE — Progress Notes (Signed)
HPI: FU CAD with prior PCI of his LAD in May 2003. His last cardiac catheterization was performed on December 16, 2008 by Dr. Olevia Perches for exertional chest pain. He was found to have a 30-40% LAD just distal to the stent. There was no other obstructive disease noted. The ejection fraction is 50%. He has been treated medically. Abdominal ultrasound in Oct 2011 showed no aneurysm. Nuclear study inMarch 2020 showed ejection fraction 56%. Patient had significant ST changes but perfusion was normal.Echocardiogram May 2020 showed normal LV function, mild left atrial enlargement. Cardiac MRI October 2020 showed basal anteroseptal enhancement which could be seen with prior myocarditis but also could be seen with sarcoid.  Pattern not consistent with amyloid.  Ejection fraction 61% and normal. Patient has had worsening problems with bilateral lower extremity edema recently.  Work-up has demonstrated significant anemia.  Since I last saw him,he denies dyspnea, chest pain, palpitations or syncope.  His hemoglobin has been improving by his report.  With that his pedal edema has also improved.  Current Outpatient Medications  Medication Sig Dispense Refill  . aspirin 81 MG tablet Take 81 mg by mouth daily.      Marland Kitchen atorvastatin (LIPITOR) 40 MG tablet TAKE 1 TABLET(40 MG) BY MOUTH DAILY 30 tablet 10  . azelastine (ASTELIN) 0.1 % nasal spray Place 1 spray into both nostrils 2 (two) times daily. Use in each nostril as directed (Patient taking differently: Place 1 spray into both nostrils daily. Use in each nostril as directed) 30 mL 12  . Biotin 1000 MCG tablet Take 1,000 mcg by mouth daily.     . cetirizine (ZYRTEC) 10 MG tablet Take 10 mg by mouth daily.    . Cinnamon 500 MG capsule Take 1,000 mg by mouth 2 (two) times daily.     Marland Kitchen CIPRODEX OTIC suspension INT 3 GTS INTO BOTH EARS QHS    . Coenzyme Q10 (COQ-10) 100 MG capsule Take 1 capsule (100 mg total) by mouth daily.    Marland Kitchen ezetimibe (ZETIA) 10 MG tablet  Take 1 tablet (10 mg total) by mouth daily. 30 tablet 5  . ferrous sulfate 325 (65 FE) MG tablet Take 325 mg by mouth daily with breakfast.    . Ginger, Zingiber officinalis, (GINGER ROOT) 550 MG CAPS Take 1 capsule by mouth 2 (two) times daily.     Marland Kitchen levothyroxine (SYNTHROID) 112 MCG tablet Take 1 tablet (112 mcg total) by mouth at bedtime. 90 tablet 1  . metFORMIN (GLUCOPHAGE) 500 MG tablet TAKE 1 TABLET(500 MG) BY MOUTH TWICE DAILY WITH A MEAL 180 tablet 1  . Multiple Vitamins-Minerals (THERAGRAN-M ADVANCED 50 PLUS PO) Take by mouth.    . Nerve Stimulator (CEFALY KIT) DEVI by Does not apply route.    Marland Kitchen OVER THE COUNTER MEDICATION Migra-eeze: Take 1 gel capsule two times daily.    . pantoprazole (PROTONIX) 40 MG tablet Take 1 tablet (40 mg total) by mouth daily. 90 tablet 4  . piroxicam (FELDENE) 10 MG capsule 1-2 daily prn 200 capsule 1  . propranolol ER (INDERAL LA) 160 MG SR capsule TAKE 1 CAPSULE(160 MG) BY MOUTH DAILY 30 capsule 5  . tamsulosin (FLOMAX) 0.4 MG CAPS capsule Take 1 capsule (0.4 mg total) by mouth daily. Reported on 09/23/2015 30 capsule   . topiramate (TOPAMAX) 25 MG tablet Take 1 tablet (25 mg total) by mouth at bedtime. 30 tablet 3  . Turmeric 500 MG CAPS Take 2 capsules by mouth 2 (two)  times a day.    . furosemide (LASIX) 40 MG tablet Take 1 tablet (40 mg total) by mouth daily. 90 tablet 3  . losartan (COZAAR) 50 MG tablet Take 1 tablet (50 mg total) by mouth daily. 90 tablet 3   No current facility-administered medications for this visit.     Past Medical History:  Diagnosis Date  . Allergy   . Arthritis   . CAD (coronary artery disease)    s/p PTCI in 2003 and 2004 to the LAD.   . Cataract   . Chronic tubotympanic suppurative otitis media, bilateral   . Depression   . Diabetes mellitus   . Eustachian tube dysfunction   . History of placement of ear tubes   . Hyperlipidemia   . Hypertension   . Hypothyroid   . Internal hemorrhoids   . Migraine headache    . Skin cancer, basal cell   . Thrombocytopenia (HCC)   . Thrombocytopenia, unspecified (HCC) 05/08/2013  . TIA (transient ischemic attack) 2006   per patient's report.  He was never officially given this diagnosis    Past Surgical History:  Procedure Laterality Date  . COLONOSCOPY  2011  . coronary artery disease status post placement     of drug-eluting stent in the LAD in 2003,eEF 65% then  . esophogeal dilation    . fatty tissue removed     from neck 2003  . HERNIA REPAIR  2007  . POLYPECTOMY  2011   +TA  . right toe bone spur surgery    . THYROIDECTOMY, PARTIAL  07/2002   right thyroid  . TONSILLECTOMY    . UPPER GI ENDOSCOPY      Social History   Socioeconomic History  . Marital status: Married    Spouse name: Charleen  . Number of children: 0  . Years of education: Not on file  . Highest education level: Associate degree: occupational, technical, or vocational program  Occupational History  . Occupation: retired    Employer: RETIRED    Comment: accountant  Tobacco Use  . Smoking status: Former Smoker    Years: 5.00    Types: Cigarettes    Quit date: 10/10/1965    Years since quitting: 53.9  . Smokeless tobacco: Never Used  Substance and Sexual Activity  . Alcohol use: No    Alcohol/week: 0.0 standard drinks    Comment: wine rarely  . Drug use: No  . Sexual activity: Yes    Partners: Female  Other Topics Concern  . Not on file  Social History Narrative   Pt lives with spouse at private home in 2 story home- he has no children- right handed- he drinks coffee daily, tea sometimes, soda not all the time. Exercise-- treadmill, stationary bike   Social Determinants of Health   Financial Resource Strain:   . Difficulty of Paying Living Expenses: Not on file  Food Insecurity:   . Worried About Running Out of Food in the Last Year: Not on file  . Ran Out of Food in the Last Year: Not on file  Transportation Needs:   . Lack of Transportation (Medical): Not  on file  . Lack of Transportation (Non-Medical): Not on file  Physical Activity:   . Days of Exercise per Week: Not on file  . Minutes of Exercise per Session: Not on file  Stress:   . Feeling of Stress : Not on file  Social Connections:   . Frequency of Communication with Friends and Family: Not   on file  . Frequency of Social Gatherings with Friends and Family: Not on file  . Attends Religious Services: Not on file  . Active Member of Clubs or Organizations: Not on file  . Attends Club or Organization Meetings: Not on file  . Marital Status: Not on file  Intimate Partner Violence:   . Fear of Current or Ex-Partner: Not on file  . Emotionally Abused: Not on file  . Physically Abused: Not on file  . Sexually Abused: Not on file    Family History  Problem Relation Age of Onset  . Mental illness Mother        alzheimers  . Heart disease Mother   . Heart disease Father        triple by pass  . Hypertension Brother        Prostate Cancer  . Prostate cancer Brother   . Hypertension Brother   . Hypertension Brother   . COPD Brother   . Colon cancer Neg Hx   . Colon polyps Neg Hx   . Rectal cancer Neg Hx   . Stomach cancer Neg Hx   . Esophageal cancer Neg Hx     ROS: no fevers or chills, productive cough, hemoptysis, dysphasia, odynophagia, melena, hematochezia, dysuria, hematuria, rash, seizure activity, orthopnea, PND, pedal edema, claudication. Remaining systems are negative.  Physical Exam: Well-developed well-nourished in no acute distress.  Skin is warm and dry.  HEENT is normal.  Neck is supple.  Chest is clear to auscultation with normal expansion.  Cardiovascular exam is regular rate and rhythm.  Abdominal exam nontender or distended. No masses palpated. Extremities show no edema. neuro grossly intact  A/P  1 lower extremity edema-previous cardiac evaluation negative.  Echocardiogram showed normal LV function and cardiac MRI did not suggest amyloid.  Chest  x-ray not consistent with sarcoid.  Probable contribution from anemia with high output.  Edema is much improved compared to previous.  Continue low-dose Lasix.  Check potassium and renal function.  2 coronary artery disease-he denies chest pain.  Continue aspirin and statin.  3 hypertension-blood pressure controlled.  Continue present medical regimen.  4 hyperlipidemia-continue statin.  5 anemia-being evaluated by gastroenterology.  Apparently has some bleeding from small intestines.  Recheck hemoglobin.   , MD    

## 2019-09-23 ENCOUNTER — Other Ambulatory Visit: Payer: Self-pay

## 2019-09-23 ENCOUNTER — Ambulatory Visit (INDEPENDENT_AMBULATORY_CARE_PROVIDER_SITE_OTHER): Payer: Medicare Other | Admitting: Cardiology

## 2019-09-23 ENCOUNTER — Encounter: Payer: Self-pay | Admitting: Cardiology

## 2019-09-23 VITALS — BP 148/72 | HR 60 | Temp 97.0°F | Ht 69.0 in | Wt 168.0 lb

## 2019-09-23 DIAGNOSIS — Z9861 Coronary angioplasty status: Secondary | ICD-10-CM

## 2019-09-23 DIAGNOSIS — I2510 Atherosclerotic heart disease of native coronary artery without angina pectoris: Secondary | ICD-10-CM

## 2019-09-23 DIAGNOSIS — I10 Essential (primary) hypertension: Secondary | ICD-10-CM | POA: Diagnosis not present

## 2019-09-23 DIAGNOSIS — E785 Hyperlipidemia, unspecified: Secondary | ICD-10-CM | POA: Diagnosis not present

## 2019-09-23 NOTE — Patient Instructions (Signed)
Medication Instructions:  NO CHANGE *If you need a refill on your cardiac medications before your next appointment, please call your pharmacy*  Lab Work: Your physician recommends that you HAVE LAB WORK TODAY If you have labs (blood work) drawn today and your tests are completely normal, you will receive your results only by: . MyChart Message (if you have MyChart) OR . A paper copy in the mail If you have any lab test that is abnormal or we need to change your treatment, we will call you to review the results.  Follow-Up: At [facility], you and your health needs are our priority.  As part of our continuing mission to provide you with exceptional heart care, we have created designated Provider Care Teams.  These Care Teams include your primary Cardiologist (physician) and Advanced Practice Providers (APPs -  Physician Assistants and Nurse Practitioners) who all work together to provide you with the care you need, when you need it.  Your next appointment:   6 month(s)  The format for your next appointment:   Either In Person or Virtual  Provider:   You may see Brian Crenshaw, MD or one of the following Advanced Practice Providers on your designated Care Team:    Luke Kilroy, PA-C  Callie Goodrich, PA-C  Jesse Cleaver, FNP    

## 2019-09-24 ENCOUNTER — Telehealth: Payer: Self-pay | Admitting: *Deleted

## 2019-09-24 DIAGNOSIS — R609 Edema, unspecified: Secondary | ICD-10-CM

## 2019-09-24 LAB — CBC
Hematocrit: 38 % (ref 37.5–51.0)
Hemoglobin: 11.5 g/dL — ABNORMAL LOW (ref 13.0–17.7)
MCH: 24.6 pg — ABNORMAL LOW (ref 26.6–33.0)
MCHC: 30.3 g/dL — ABNORMAL LOW (ref 31.5–35.7)
MCV: 81 fL (ref 79–97)
Platelets: 195 10*3/uL (ref 150–450)
RBC: 4.67 x10E6/uL (ref 4.14–5.80)
RDW: 19.1 % — ABNORMAL HIGH (ref 11.6–15.4)
WBC: 5 10*3/uL (ref 3.4–10.8)

## 2019-09-24 LAB — BASIC METABOLIC PANEL
BUN/Creatinine Ratio: 20 (ref 10–24)
BUN: 28 mg/dL — ABNORMAL HIGH (ref 8–27)
CO2: 23 mmol/L (ref 20–29)
Calcium: 8.4 mg/dL — ABNORMAL LOW (ref 8.6–10.2)
Chloride: 110 mmol/L — ABNORMAL HIGH (ref 96–106)
Creatinine, Ser: 1.42 mg/dL — ABNORMAL HIGH (ref 0.76–1.27)
GFR calc Af Amer: 55 mL/min/{1.73_m2} — ABNORMAL LOW (ref >59)
GFR calc non Af Amer: 48 mL/min/{1.73_m2} — ABNORMAL LOW (ref >59)
Glucose: 99 mg/dL (ref 65–99)
Potassium: 4.4 mmol/L (ref 3.5–5.2)
Sodium: 143 mmol/L (ref 134–144)

## 2019-09-24 MED ORDER — FUROSEMIDE 40 MG PO TABS: 20.0000 mg | ORAL_TABLET | Freq: Every day | ORAL | 3 refills | Status: DC

## 2019-09-24 NOTE — Telephone Encounter (Signed)
-----   Message from Lelon Perla, MD sent at 09/24/2019  9:11 AM EST ----- Hemoglobin improving.  Follow-up primary care for this issue.  Decrease Lasix to 20 mg daily.  Check potassium and renal function in 2 weeks Kirk Ruths

## 2019-09-24 NOTE — Telephone Encounter (Signed)
Results released to my chart with medication instructions and Lab orders mailed to the pt

## 2019-09-30 LAB — HM DIABETES EYE EXAM

## 2019-10-01 ENCOUNTER — Encounter: Payer: Self-pay | Admitting: Family Medicine

## 2019-10-01 ENCOUNTER — Other Ambulatory Visit: Payer: Self-pay | Admitting: Gastroenterology

## 2019-10-01 DIAGNOSIS — E785 Hyperlipidemia, unspecified: Secondary | ICD-10-CM

## 2019-10-02 MED ORDER — EZETIMIBE 10 MG PO TABS: 10.0000 mg | ORAL_TABLET | Freq: Every day | ORAL | 1 refills | Status: DC

## 2019-10-07 LAB — BASIC METABOLIC PANEL
BUN/Creatinine Ratio: 18 (ref 10–24)
BUN: 23 mg/dL (ref 8–27)
CO2: 23 mmol/L (ref 20–29)
Calcium: 8.3 mg/dL — ABNORMAL LOW (ref 8.6–10.2)
Chloride: 110 mmol/L — ABNORMAL HIGH (ref 96–106)
Creatinine, Ser: 1.27 mg/dL (ref 0.76–1.27)
GFR calc Af Amer: 63 mL/min/{1.73_m2} (ref >59)
GFR calc non Af Amer: 55 mL/min/{1.73_m2} — ABNORMAL LOW (ref >59)
Glucose: 111 mg/dL — ABNORMAL HIGH (ref 65–99)
Potassium: 4.4 mmol/L (ref 3.5–5.2)
Sodium: 142 mmol/L (ref 134–144)

## 2019-10-21 ENCOUNTER — Encounter: Payer: Self-pay | Admitting: Family Medicine

## 2019-10-22 ENCOUNTER — Other Ambulatory Visit: Payer: Self-pay | Admitting: Family Medicine

## 2019-10-22 ENCOUNTER — Other Ambulatory Visit: Payer: Self-pay

## 2019-10-22 ENCOUNTER — Ambulatory Visit: Payer: Medicare Other | Admitting: Family Medicine

## 2019-10-22 DIAGNOSIS — E785 Hyperlipidemia, unspecified: Secondary | ICD-10-CM

## 2019-10-22 DIAGNOSIS — E1165 Type 2 diabetes mellitus with hyperglycemia: Secondary | ICD-10-CM

## 2019-10-22 DIAGNOSIS — E039 Hypothyroidism, unspecified: Secondary | ICD-10-CM

## 2019-10-22 DIAGNOSIS — I2510 Atherosclerotic heart disease of native coronary artery without angina pectoris: Secondary | ICD-10-CM

## 2019-10-22 DIAGNOSIS — E1169 Type 2 diabetes mellitus with other specified complication: Secondary | ICD-10-CM

## 2019-10-22 MED ORDER — LEVOTHYROXINE SODIUM 112 MCG PO TABS: 112.0000 ug | ORAL_TABLET | Freq: Every day | ORAL | 1 refills | Status: DC

## 2019-10-22 MED ORDER — EZETIMIBE 10 MG PO TABS: 10.0000 mg | ORAL_TABLET | Freq: Every day | ORAL | 1 refills | Status: DC

## 2019-10-23 ENCOUNTER — Other Ambulatory Visit (INDEPENDENT_AMBULATORY_CARE_PROVIDER_SITE_OTHER): Payer: Medicare Other

## 2019-10-23 DIAGNOSIS — E559 Vitamin D deficiency, unspecified: Secondary | ICD-10-CM | POA: Diagnosis not present

## 2019-10-23 DIAGNOSIS — E1169 Type 2 diabetes mellitus with other specified complication: Secondary | ICD-10-CM

## 2019-10-23 DIAGNOSIS — E039 Hypothyroidism, unspecified: Secondary | ICD-10-CM

## 2019-10-23 DIAGNOSIS — I2510 Atherosclerotic heart disease of native coronary artery without angina pectoris: Secondary | ICD-10-CM | POA: Diagnosis not present

## 2019-10-23 DIAGNOSIS — E785 Hyperlipidemia, unspecified: Secondary | ICD-10-CM

## 2019-10-23 DIAGNOSIS — E1165 Type 2 diabetes mellitus with hyperglycemia: Secondary | ICD-10-CM

## 2019-10-23 DIAGNOSIS — Z9861 Coronary angioplasty status: Secondary | ICD-10-CM

## 2019-10-23 LAB — COMPREHENSIVE METABOLIC PANEL
ALT: 17 U/L (ref 0–53)
AST: 19 U/L (ref 0–37)
Albumin: 3.3 g/dL — ABNORMAL LOW (ref 3.5–5.2)
Alkaline Phosphatase: 62 U/L (ref 39–117)
BUN: 24 mg/dL — ABNORMAL HIGH (ref 6–23)
CO2: 26 mEq/L (ref 19–32)
Calcium: 8.4 mg/dL (ref 8.4–10.5)
Chloride: 110 mEq/L (ref 96–112)
Creatinine, Ser: 1.4 mg/dL (ref 0.40–1.50)
GFR: 49.3 mL/min — ABNORMAL LOW (ref >60.00)
Glucose, Bld: 116 mg/dL — ABNORMAL HIGH (ref 70–99)
Potassium: 4 mEq/L (ref 3.5–5.1)
Sodium: 141 mEq/L (ref 135–145)
Total Bilirubin: 0.4 mg/dL (ref 0.2–1.2)
Total Protein: 5.4 g/dL — ABNORMAL LOW (ref 6.0–8.3)

## 2019-10-23 LAB — LIPID PANEL
Cholesterol: 94 mg/dL (ref 0–200)
HDL: 45.5 mg/dL (ref >39.00)
LDL Cholesterol: 38 mg/dL (ref 0–99)
NonHDL: 48.4
Total CHOL/HDL Ratio: 2
Triglycerides: 54 mg/dL (ref 0.0–149.0)
VLDL: 10.8 mg/dL (ref 0.0–40.0)

## 2019-10-23 LAB — TSH: TSH: 3.14 u[IU]/mL (ref 0.35–4.50)

## 2019-10-23 LAB — CBC WITH DIFFERENTIAL/PLATELET
Basophils Absolute: 0.1 10*3/uL (ref 0.0–0.1)
Basophils Relative: 1.1 % (ref 0.0–3.0)
Eosinophils Absolute: 0.5 10*3/uL (ref 0.0–0.7)
Eosinophils Relative: 9.6 % — ABNORMAL HIGH (ref 0.0–5.0)
HCT: 39.8 % (ref 39.0–52.0)
Hemoglobin: 12.7 g/dL — ABNORMAL LOW (ref 13.0–17.0)
Lymphocytes Relative: 16.3 % (ref 12.0–46.0)
Lymphs Abs: 0.8 10*3/uL (ref 0.7–4.0)
MCHC: 32 g/dL (ref 30.0–36.0)
MCV: 81.4 fl (ref 78.0–100.0)
Monocytes Absolute: 0.4 10*3/uL (ref 0.1–1.0)
Monocytes Relative: 9.2 % (ref 3.0–12.0)
Neutro Abs: 3.1 10*3/uL (ref 1.4–7.7)
Neutrophils Relative %: 63.8 % (ref 43.0–77.0)
Platelets: 169 10*3/uL (ref 150.0–400.0)
RBC: 4.88 Mil/uL (ref 4.22–5.81)
RDW: 19.9 % — ABNORMAL HIGH (ref 11.5–15.5)
WBC: 4.8 10*3/uL (ref 4.0–10.5)

## 2019-10-23 LAB — VITAMIN D 25 HYDROXY (VIT D DEFICIENCY, FRACTURES): VITD: 51.82 ng/mL (ref 30.00–100.00)

## 2019-10-23 LAB — HEMOGLOBIN A1C: Hgb A1c MFr Bld: 5.5 % (ref 4.6–6.5)

## 2019-10-24 ENCOUNTER — Other Ambulatory Visit: Payer: Self-pay

## 2019-10-24 ENCOUNTER — Ambulatory Visit (INDEPENDENT_AMBULATORY_CARE_PROVIDER_SITE_OTHER): Payer: Medicare Other | Admitting: Family Medicine

## 2019-10-24 ENCOUNTER — Encounter: Payer: Self-pay | Admitting: Family Medicine

## 2019-10-24 VITALS — BP 140/80 | HR 64 | Temp 97.7°F | Ht 69.0 in | Wt 171.4 lb

## 2019-10-24 DIAGNOSIS — G8929 Other chronic pain: Secondary | ICD-10-CM | POA: Diagnosis not present

## 2019-10-24 DIAGNOSIS — E785 Hyperlipidemia, unspecified: Secondary | ICD-10-CM

## 2019-10-24 DIAGNOSIS — M25511 Pain in right shoulder: Secondary | ICD-10-CM

## 2019-10-24 DIAGNOSIS — M159 Polyosteoarthritis, unspecified: Secondary | ICD-10-CM

## 2019-10-24 DIAGNOSIS — I2510 Atherosclerotic heart disease of native coronary artery without angina pectoris: Secondary | ICD-10-CM

## 2019-10-24 DIAGNOSIS — Z0000 Encounter for general adult medical examination without abnormal findings: Secondary | ICD-10-CM

## 2019-10-24 DIAGNOSIS — I10 Essential (primary) hypertension: Secondary | ICD-10-CM | POA: Diagnosis not present

## 2019-10-24 DIAGNOSIS — E119 Type 2 diabetes mellitus without complications: Secondary | ICD-10-CM

## 2019-10-24 DIAGNOSIS — E1169 Type 2 diabetes mellitus with other specified complication: Secondary | ICD-10-CM

## 2019-10-24 DIAGNOSIS — E039 Hypothyroidism, unspecified: Secondary | ICD-10-CM

## 2019-10-24 DIAGNOSIS — Z9861 Coronary angioplasty status: Secondary | ICD-10-CM | POA: Diagnosis not present

## 2019-10-24 MED ORDER — EZETIMIBE 10 MG PO TABS: 10.0000 mg | ORAL_TABLET | Freq: Every day | ORAL | 1 refills | Status: DC

## 2019-10-24 MED ORDER — PIROXICAM 10 MG PO CAPS: ORAL_CAPSULE | ORAL | 1 refills | Status: DC

## 2019-10-24 MED ORDER — LEVOTHYROXINE SODIUM 112 MCG PO TABS: 112.0000 ug | ORAL_TABLET | Freq: Every day | ORAL | 1 refills | Status: DC

## 2019-10-24 NOTE — Patient Instructions (Signed)

## 2019-10-24 NOTE — Progress Notes (Signed)
Patient ID: [name], male    DOB: [birth year]  Age: 74 y.o. MRN: [id number]    Subjective:  Subjective  HPI [name] presents for cpe and f/u dm, bp and chol  HYPERTENSION   Blood pressure range-not checking no  Chest pain- no      Dyspnea- no Lightheadedness- no   Edema- no  Other side effects - no   Medication compliance: good Low salt diet- yes    DIABETES    Blood Sugar ranges-good per pt ---- per endo   Polyuria- no New Visual problems- no  Hypoglycemic symptoms- no  Other side effects-no Medication compliance - good Last eye exam-  Foot exam- endo   HYPERLIPIDEMIA  Medication compliance- good RUQ pain- no  Muscle aches- no Other side effects-no      Review of Systems  Constitutional: Negative.   HENT: Negative for congestion, ear pain, hearing loss, nosebleeds, postnasal drip, rhinorrhea, sinus pressure, sneezing and tinnitus.   Eyes: Negative for photophobia, discharge, itching and visual disturbance.  Respiratory: Negative.   Cardiovascular: Negative.   Gastrointestinal: Negative for abdominal distention, abdominal pain, anal bleeding, blood in stool and constipation.  Endocrine: Negative.   Genitourinary: Negative.   Musculoskeletal: Positive for arthralgias. Negative for joint swelling.  Skin: Negative.   Allergic/Immunologic: Negative.   Neurological: Negative for dizziness, weakness, light-headedness, numbness and headaches.  Psychiatric/Behavioral: Negative for agitation, confusion, decreased concentration, dysphoric mood, sleep disturbance and suicidal ideas. The patient is not nervous/anxious.     History Past Medical History:  Diagnosis Date  . Allergy   . Arthritis   . CAD (coronary artery disease)    s/p PTCI in 2003 and 2004 to the LAD.   Marland Kitchen Cataract   . Chronic tubotympanic suppurative otitis media, bilateral   . Depression   . Diabetes mellitus   . Eustachian tube dysfunction   . History of placement of ear tubes   .  Hyperlipidemia   . Hypertension   . Hypothyroid   . Internal hemorrhoids   . Migraine headache   . Skin cancer, basal cell   . Thrombocytopenia ([place])   . Thrombocytopenia, unspecified ([place]) 05/08/2013  . TIA (transient ischemic attack) 2006   per patient's report.  He was never officially given this diagnosis    He has a past surgical history that includes Hernia repair (2007); fatty tissue removed; Thyroidectomy, partial (07/2002); coronary artery disease status post placement; right toe bone spur surgery; Tonsillectomy; Colonoscopy (2011); Polypectomy (2011); Upper gi endoscopy; and esophogeal dilation.   His family history includes COPD in his brother; Heart disease in his father and mother; Hypertension in his brother, brother, and brother; Mental illness in his mother; Prostate cancer in his brother.He reports that he quit smoking about 54 years ago. His smoking use included cigarettes. He quit after 5.00 years of use. He has never used smokeless tobacco. He reports that he does not drink alcohol or use drugs.  Current Outpatient Medications on File Prior to Visit  Medication Sig Dispense Refill  . aspirin 81 MG tablet Take 81 mg by mouth daily.      Marland Kitchen atorvastatin (LIPITOR) 40 MG tablet TAKE 1 TABLET(40 MG) BY MOUTH DAILY 30 tablet 10  . azelastine (ASTELIN) 0.1 % nasal spray Place 1 spray into both nostrils 2 (two) times daily. Use in each nostril as directed (Patient taking differently: Place 1 spray into both nostrils daily. Use in each nostril as directed) 30 mL 12  . Biotin 1000  MCG tablet Take 1,000 mcg by mouth daily.     . cetirizine (ZYRTEC) 10 MG tablet Take 10 mg by mouth daily.    . cholecalciferol (VITAMIN D3) 25 MCG (1000 UNIT) tablet Take 1,000 Units by mouth daily.    . Cinnamon 500 MG capsule Take 1,000 mg by mouth 2 (two) times daily.     Marland Kitchen CIPRODEX OTIC suspension INT 3 GTS INTO BOTH EARS QHS    . Coenzyme Q10 (COQ-10) 100 MG capsule Take 1 capsule (100 mg total) by  mouth daily.    . ferrous sulfate 325 (65 FE) MG tablet Take 325 mg by mouth daily with breakfast.    . furosemide (LASIX) 40 MG tablet Take 0.5 tablets (20 mg total) by mouth daily. 45 tablet 3  . Ginger, Zingiber officinalis, (GINGER ROOT) 550 MG CAPS Take 1 capsule by mouth 2 (two) times daily.     . metFORMIN (GLUCOPHAGE) 500 MG tablet TAKE 1 TABLET(500 MG) BY MOUTH TWICE DAILY WITH A MEAL 180 tablet 1  . Multiple Vitamins-Minerals (THERAGRAN-M ADVANCED 50 PLUS PO) Take by mouth.    . Nerve Stimulator (CEFALY KIT) DEVI by Does not apply route.    Marland Kitchen OVER THE COUNTER MEDICATION Migra-eeze: Take 1 gel capsule two times daily.    . pantoprazole (PROTONIX) 40 MG tablet TAKE 1 TABLET(40 MG) BY MOUTH DAILY 90 tablet 1  . propranolol ER (INDERAL LA) 160 MG SR capsule TAKE 1 CAPSULE(160 MG) BY MOUTH DAILY 30 capsule 5  . tamsulosin (FLOMAX) 0.4 MG CAPS capsule Take 1 capsule (0.4 mg total) by mouth daily. Reported on 09/23/2015 30 capsule   . topiramate (TOPAMAX) 25 MG tablet Take 1 tablet (25 mg total) by mouth at bedtime. 30 tablet 3  . Turmeric 500 MG CAPS Take 2 capsules by mouth 2 (two) times a day.    . losartan (COZAAR) 50 MG tablet Take 1 tablet (50 mg total) by mouth daily. 90 tablet 3   No current facility-administered medications on file prior to visit.     Objective:  Objective  Physical Exam Vitals and nursing note reviewed.  Constitutional:      General: He is not in acute distress.    Appearance: He is well-developed. He is not diaphoretic.  HENT:     Head: Normocephalic and atraumatic.     Right Ear: External ear normal.     Left Ear: External ear normal.     Nose: Nose normal.     Mouth/Throat:     Pharynx: No oropharyngeal exudate.  Eyes:     General:        Right eye: No discharge.        Left eye: No discharge.     Conjunctiva/sclera: Conjunctivae normal.     Pupils: Pupils are equal, round, and reactive to light.  Neck:     Thyroid: No thyromegaly.      Vascular: No JVD.  Cardiovascular:     Rate and Rhythm: Normal rate and regular rhythm.     Heart sounds: No murmur. No friction rub. No gallop.   Pulmonary:     Effort: Pulmonary effort is normal. No respiratory distress.     Breath sounds: Normal breath sounds. No wheezing or rales.  Chest:     Chest wall: No tenderness.  Abdominal:     General: Bowel sounds are normal. There is no distension.     Palpations: Abdomen is soft. There is no mass.     Tenderness:  There is no abdominal tenderness. There is no guarding or rebound.  Genitourinary:    Comments: urology Musculoskeletal:        General: No tenderness. Normal range of motion.     Cervical back: Normal range of motion and neck supple.  Lymphadenopathy:     Cervical: No cervical adenopathy.  Skin:    General: Skin is warm and dry.     Coloration: Skin is not pale.     Findings: No erythema or rash.  Neurological:     Mental Status: He is alert and oriented to person, place, and time.     Motor: No abnormal muscle tone.     Deep Tendon Reflexes: Reflexes are normal and symmetric. Reflexes normal.  Psychiatric:        Behavior: Behavior normal.        Thought Content: Thought content normal.        Judgment: Judgment normal.    BP 140/80 (BP Location: Left Arm, Patient Position: Sitting, Cuff Size: Normal)   Pulse 64   Temp 97.7 F (36.5 C) (Temporal)   Ht '5\' 9"'  (1.753 m)   Wt 171 lb 6.4 oz (77.7 kg)   SpO2 93%   BMI 25.31 kg/m  Wt Readings from Last 3 Encounters:  10/24/19 171 lb 6.4 oz (77.7 kg)  09/23/19 168 lb (76.2 kg)  08/08/19 175 lb (79.4 kg)   Diabetic Foot Exam - Simple   Simple Foot Form Diabetic Foot exam was performed with the following findings: Yes 10/24/2019  2:00 PM  Visual Inspection No deformities, no ulcerations, no other skin breakdown bilaterally: Yes Sensation Testing Intact to touch and monofilament testing bilaterally: Yes Pulse Check Posterior Tibialis and Dorsalis pulse intact  bilaterally: Yes Comments       Lab Results  Component Value Date   WBC 4.8 10/23/2019   HGB 12.7 (L) 10/23/2019   HCT 39.8 10/23/2019   PLT 169.0 10/23/2019   GLUCOSE 116 (H) 10/23/2019   CHOL 94 10/23/2019   TRIG 54.0 10/23/2019   HDL 45.50 10/23/2019   LDLCALC 38 10/23/2019   ALT 17 10/23/2019   AST 19 10/23/2019   NA 141 10/23/2019   K 4.0 10/23/2019   CL 110 10/23/2019   CREATININE 1.40 10/23/2019   BUN 24 (H) 10/23/2019   CO2 26 10/23/2019   TSH 3.14 10/23/2019   PSA 5.08 (H) 10/18/2018   INR 1.0 07/01/2019   HGBA1C 5.5 10/23/2019   MICROALBUR 1.3 04/22/2019    MR CARDIAC MORPHOLOGY W WO CONTRAST  Result Date: 07/12/2019 CLINICAL DATA:  Evaluate for infiltrative disease EXAM: CARDIAC MRI TECHNIQUE: The patient was scanned on a 1.5 Tesla Siemens magnet. A dedicated cardiac coil was used. Functional imaging was done using Fiesta sequences. 2,3, and 4 chamber views were done to assess for RWMA's. Modified Simpson's rule using a short axis stack was used to calculate an ejection fraction on a dedicated work Conservation officer, nature. The patient received 10 cc of Gadavist. After 10 minutes inversion recovery sequences were used to assess for infiltration and scar tissue. CONTRAST:  10 cc  of Gadavist FINDINGS: Left ventricle: - Asymmetric septal hypertrophy measuring up to 1m (lateral wall 642m - Normal size - Normal systolic function - Elevated ECV (33%) in basal septum; normal in lateral wall (23%) - Basal anteroseptal midwall LGE LV EF: 61% (Normal 56-78%) Absolute volumes: LV EDV: 16578mNormal 77-195 mL) LV ESV: 66m71mormal 19-72 mL) LV SV: 100mL21mrmal 51-133 mL) CO:  6.0L/min (Normal 2.8-8.8 L/min) Indexed volumes: LV EDV: 57m/sq-m (Normal 47-92 mL/sq-m) LV ESV: 370msq-m (Normal 13-30 mL/sq-m) LV SV: 5116mq-m (Normal 32-62 mL/sq-m) CI: 3.1L/min/sq-m (Normal 1.7-4.2 L/min/sq-m) Right ventricle: RV EF:  64% (Normal 47-74%) Absolute volumes: RV EDV: 164m1mormal  88-227 mL) RV ESV: 60mL42mrmal 23-103 mL) RV SV: 104mL 27mmal 52-138 mL) CO: 6.3L/min (Normal 2.8-8.8 L/min) Indexed volumes: RV EDV: 84mL/s59m(Normal 55-105 mL/sq-m) RV ESV: 31mL/sq53mNormal 15-43 mL/sq-m) RV SV: 53mL/sq-50mormal 32-64 mL/sq-m) CI: 3.2L/min/sq-m (Normal 1.7-4.2 L/min/sq-m) Left atrium: Mild enlargement Right atrium: Normal size Mitral valve: Mild regurgitation Aortic valve: No regurgitation Tricuspid valve: No regurgitation Pericardium: Normal IMPRESSION: 1. Basal anteroseptal midwall late gadolinium enhancement. This scar pattern can be seen with prior myocarditis. Sarcoidosis also on differential, would consider chest CT to evaluate for evidence of sarcoid 2. Asymmetric basal septal hypertrophy measuring up to 13mm (lat36m wall 6mm), not 47mting criteria for hypertrophic cardiomyopathy (<15mm). Amyl52msis can also present with asymmetric hypertrophy but LGE pattern not typical of amyloid and extracellular volume is not in amyloid range 3.  Normal LV size and systolic function (EF 61%) 4.  Nor00% RV size and systolic function (EF 64%) Electro86%ally Signed   By: Christopher Oswaldo Milian/11/2018 07:47     Assessment & Plan:  Plan  I am having Kerry A. GrOk Edwardsain his aspirin, Biotin, Cinnamon, Ginger Root, tamsulosin, OVER THE COUNTER MEDICATION, Cefaly Kit, azelastine, Multiple Vitamins-Minerals (THERAGRAN-M ADVANCED 50 PLUS PO), atorvastatin, Ciprodex, cetirizine, CoQ-10, Turmeric, losartan, ferrous sulfate, metFORMIN, propranolol ER, topiramate, furosemide, pantoprazole, cholecalciferol, levothyroxine, ezetimibe, and piroxicam.  Meds ordered this encounter  Medications  . levothyroxine (SYNTHROID) 112 MCG tablet    Sig: Take 1 tablet (112 mcg total) by mouth at bedtime.    Dispense:  90 tablet    Refill:  1  . ezetimibe (ZETIA) 10 MG tablet    Sig: Take 1 tablet (10 mg total) by mouth daily.    Dispense:  90 tablet    Refill:  1  . piroxicam (FELDENE) 10  MG capsule    Sig: 1-2 daily prn    Dispense:  200 capsule    Refill:  1    Problem List Items Addressed This Visit      Unprioritized   Chronic right shoulder pain    Refer to ortho      Relevant Medications   piroxicam (FELDENE) 10 MG capsule   Other Relevant Orders   Ambulatory referral to Orthopedic Surgery   Essential hypertension    Well controlled, no changes to meds. Encouraged heart healthy diet such as the DASH diet and exercise as tolerated.       Relevant Medications   ezetimibe (ZETIA) 10 MG tablet   Hyperlipidemia associated with type 2 diabetes mellitus (HCC)    ToleHaysing statin, encouraged heart healthy diet, avoid trans fats, minimize simple carbs and saturated fats. Increase exercise as tolerated      Relevant Medications   ezetimibe (ZETIA) 10 MG tablet   Hyperlipidemia LDL goal <70    Tolerating statin, encouraged heart healthy diet, avoid trans fats, minimize simple carbs and saturated fats. Increase exercise as tolerated      Relevant Medications   ezetimibe (ZETIA) 10 MG tablet   Hypothyroidism    Per endo      Relevant Medications   levothyroxine (SYNTHROID) 112 MCG tablet   Non-insulin dependent type 2 diabetes mellitus (HCC) (Chronic)    Per endo  Preventative health care - Primary    See above       Other Visit Diagnoses    Osteoarthritis of multiple joints, unspecified osteoarthritis type       Relevant Medications   piroxicam (FELDENE) 10 MG capsule      Follow-up: Return in about 6 months (around 04/22/2020), or if symptoms worsen or fail to improve, for hypertension, hyperlipidemia, diabetes II.  [name], DO

## 2019-10-28 DIAGNOSIS — M47812 Spondylosis without myelopathy or radiculopathy, cervical region: Secondary | ICD-10-CM | POA: Diagnosis not present

## 2019-10-28 DIAGNOSIS — M25562 Pain in left knee: Secondary | ICD-10-CM | POA: Diagnosis not present

## 2019-10-28 DIAGNOSIS — M25561 Pain in right knee: Secondary | ICD-10-CM | POA: Diagnosis not present

## 2019-10-28 DIAGNOSIS — M25511 Pain in right shoulder: Secondary | ICD-10-CM | POA: Diagnosis not present

## 2019-10-28 DIAGNOSIS — M25512 Pain in left shoulder: Secondary | ICD-10-CM | POA: Diagnosis not present

## 2019-10-30 ENCOUNTER — Ambulatory Visit: Payer: Medicare Other | Attending: Internal Medicine

## 2019-10-30 ENCOUNTER — Encounter: Payer: Self-pay | Admitting: Family Medicine

## 2019-10-30 DIAGNOSIS — Z23 Encounter for immunization: Secondary | ICD-10-CM | POA: Diagnosis not present

## 2019-10-30 DIAGNOSIS — G8929 Other chronic pain: Secondary | ICD-10-CM | POA: Insufficient documentation

## 2019-10-30 DIAGNOSIS — M25511 Pain in right shoulder: Secondary | ICD-10-CM

## 2019-10-30 HISTORY — DX: Other chronic pain: G89.29

## 2019-10-30 HISTORY — DX: Pain in right shoulder: M25.511

## 2019-10-30 NOTE — Assessment & Plan Note (Signed)
Per endo °

## 2019-10-30 NOTE — Assessment & Plan Note (Signed)
Tolerating statin, encouraged heart healthy diet, avoid trans fats, minimize simple carbs and saturated fats. Increase exercise as tolerated 

## 2019-10-30 NOTE — Assessment & Plan Note (Signed)
Refer to ortho.

## 2019-10-30 NOTE — Assessment & Plan Note (Signed)
See above

## 2019-10-30 NOTE — Progress Notes (Signed)
   Covid-19 Vaccination Clinic  Name:  [name]    MRN: [id number] DOB: Jun 12, 1946  10/30/2019  [name] was observed post Covid-19 immunization for 15 minutes without incidence. He was provided with Vaccine Information Sheet and instruction to access the V-Safe system.   [name] was instructed to call 911 with any severe reactions post vaccine: Marland Kitchen Difficulty breathing  . Swelling of your face and throat  . A fast heartbeat  . A bad rash all over your body  . Dizziness and weakness    Immunizations Administered    Name Date Dose VIS Date Route   Pfizer COVID-19 Vaccine 10/30/2019  1:19 PM 0.3 mL 09/20/2019 Intramuscular   Manufacturer: [place]   Lot: [id number]   [place]: [id number]

## 2019-10-30 NOTE — Assessment & Plan Note (Signed)
Well controlled, no changes to meds. Encouraged heart healthy diet such as the DASH diet and exercise as tolerated.  °

## 2019-10-31 ENCOUNTER — Telehealth: Payer: Self-pay | Admitting: Cardiology

## 2019-10-31 NOTE — Telephone Encounter (Signed)
Changed.

## 2019-10-31 NOTE — Telephone Encounter (Signed)
*  STAT* If patient is at the pharmacy, call can be transferred to refill team.   1. Which medications need to be refilled? (please list name of each medication and dose if known) Atorvastatin  2. Which pharmacy/location (including street and city if local pharmacy) is medication to be sent to? [facility] [phone]  3. Do they need a 30 day or 90 day supply? 30 and refills

## 2019-11-01 ENCOUNTER — Telehealth: Payer: Self-pay | Admitting: *Deleted

## 2019-11-01 MED ORDER — ATORVASTATIN CALCIUM 40 MG PO TABS: 40.0000 mg | ORAL_TABLET | Freq: Every day | ORAL | 5 refills | Status: DC

## 2019-11-01 NOTE — Telephone Encounter (Signed)
   [facility] Pre-operative Risk Assessment    Request for surgical clearance:  1. What type of surgery is being performed? tympanomastoidectomy   2. When is this surgery scheduled? TBD   3. What type of clearance is required (medical clearance vs. Pharmacy clearance to hold med vs. Both)? medical  4. Are there any medications that need to be held prior to surgery and how long? no   5. Practice name and name of physician performing surgery? ENT/[facility] Dr. Vicie Mutters   6. What is your office phone number [phone]    7.   What is your office fax number [phone]  8.   Anesthesia type (None, local, MAC, general) ? General   Sumiko Ceasar A Crewe Heathman 11/01/2019, 3:09 PM  _________________________________________________________________   Also request last EKG

## 2019-11-01 NOTE — Telephone Encounter (Signed)
Pt' medication was sent to pt's pharmacy as requested. Confirmation received.  

## 2019-11-04 ENCOUNTER — Other Ambulatory Visit: Payer: Self-pay

## 2019-11-04 ENCOUNTER — Encounter: Payer: Self-pay | Admitting: *Deleted

## 2019-11-04 DIAGNOSIS — M6281 Muscle weakness (generalized): Secondary | ICD-10-CM | POA: Diagnosis not present

## 2019-11-04 DIAGNOSIS — M25511 Pain in right shoulder: Secondary | ICD-10-CM | POA: Diagnosis not present

## 2019-11-04 DIAGNOSIS — S46011D Strain of muscle(s) and tendon(s) of the rotator cuff of right shoulder, subsequent encounter: Secondary | ICD-10-CM | POA: Diagnosis not present

## 2019-11-04 DIAGNOSIS — M25561 Pain in right knee: Secondary | ICD-10-CM | POA: Diagnosis not present

## 2019-11-04 MED ORDER — TOPIRAMATE 25 MG PO TABS: 25.0000 mg | ORAL_TABLET | Freq: Every day | ORAL | 3 refills | Status: DC

## 2019-11-04 NOTE — Progress Notes (Addendum)
Filled out fax received from [facility] and faxed back with clinicals.  Now waiting for reply to authorization .   Letter from [facility] sent to scan into his chart Authorization valid 11/04/2019-10/09/2020

## 2019-11-04 NOTE — Telephone Encounter (Signed)
   Primary Cardiologist: Kirk Ruths, MD  Chart reviewed as part of pre-operative protocol coverage. Patient was contacted 11/04/2019 in reference to pre-operative risk assessment for pending surgery as outlined below.  [name] was last seen on 09/23/2019 by Dr. Stanford Breed.  Since that day, [name] has done well with no new cardiac complaints.  Therefore, based on ACC/AHA guidelines, the patient would be at acceptable risk for the planned procedure without further cardiovascular testing.   I will route this recommendation to the requesting party via Epic fax function and remove from pre-op pool.  Please call with questions.  Daune Perch, NP 11/04/2019, 11:41 AM

## 2019-11-11 DIAGNOSIS — M6281 Muscle weakness (generalized): Secondary | ICD-10-CM | POA: Diagnosis not present

## 2019-11-11 DIAGNOSIS — S46011D Strain of muscle(s) and tendon(s) of the rotator cuff of right shoulder, subsequent encounter: Secondary | ICD-10-CM | POA: Diagnosis not present

## 2019-11-11 DIAGNOSIS — M25561 Pain in right knee: Secondary | ICD-10-CM | POA: Diagnosis not present

## 2019-11-11 DIAGNOSIS — M25511 Pain in right shoulder: Secondary | ICD-10-CM | POA: Diagnosis not present

## 2019-11-18 DIAGNOSIS — M25511 Pain in right shoulder: Secondary | ICD-10-CM | POA: Diagnosis not present

## 2019-11-18 DIAGNOSIS — M6281 Muscle weakness (generalized): Secondary | ICD-10-CM | POA: Diagnosis not present

## 2019-11-18 DIAGNOSIS — S46011D Strain of muscle(s) and tendon(s) of the rotator cuff of right shoulder, subsequent encounter: Secondary | ICD-10-CM | POA: Diagnosis not present

## 2019-11-18 DIAGNOSIS — M25561 Pain in right knee: Secondary | ICD-10-CM | POA: Diagnosis not present

## 2019-11-20 ENCOUNTER — Ambulatory Visit: Payer: Medicare Other | Attending: Internal Medicine

## 2019-11-20 DIAGNOSIS — Z23 Encounter for immunization: Secondary | ICD-10-CM | POA: Insufficient documentation

## 2019-11-20 NOTE — Progress Notes (Signed)
   Covid-19 Vaccination Clinic  Name:  [name]    MRN: [id number] DOB: 03-27-1946  11/20/2019  [name] was observed post Covid-19 immunization for 15 minutes without incidence. He was provided with Vaccine Information Sheet and instruction to access the V-Safe system.   [name] was instructed to call 911 with any severe reactions post vaccine: Marland Kitchen Difficulty breathing  . Swelling of your face and throat  . A fast heartbeat  . A bad rash all over your body  . Dizziness and weakness    Immunizations Administered    Name Date Dose VIS Date Route   Pfizer COVID-19 Vaccine 11/20/2019 12:57 PM 0.3 mL 09/20/2019 Intramuscular   Manufacturer: [place]   Lot: [id number]   [place]: [id number]

## 2019-11-27 DIAGNOSIS — M6281 Muscle weakness (generalized): Secondary | ICD-10-CM | POA: Diagnosis not present

## 2019-11-27 DIAGNOSIS — M25561 Pain in right knee: Secondary | ICD-10-CM | POA: Diagnosis not present

## 2019-11-27 DIAGNOSIS — S46011D Strain of muscle(s) and tendon(s) of the rotator cuff of right shoulder, subsequent encounter: Secondary | ICD-10-CM | POA: Diagnosis not present

## 2019-11-27 DIAGNOSIS — M25511 Pain in right shoulder: Secondary | ICD-10-CM | POA: Diagnosis not present

## 2019-12-01 NOTE — Progress Notes (Signed)
Due to the COVID-19 crisis, this virtual check-in visit was done via telephone from my office and it was initiated and consent given by this patient and or family.   Telephone (Audio) Visit The purpose of this telephone visit is to provide medical care while limiting exposure to the novel coronavirus.    Consent was obtained for telephone visit and initiated by pt/family:  Yes.   Answered questions that patient had about telehealth interaction:  Yes.   I discussed the limitations, risks, security and privacy concerns of performing an evaluation and management service by telephone. I also discussed with the patient that there may be a patient responsible charge related to this service. The patient expressed understanding and agreed to proceed.  Pt location: Home Physician Location: office Name of referring provider:  Ann Carney, * I connected with .[name] at patients initiation/request on 12/03/2019 at  8:30 AM EST by telephone and verified that I am speaking with the correct person using two identifiers.  Pt MRN:  [id number] Pt DOB:  10-05-1946  History of Present Illness:  [name] a 74 year old right-handed male with osteoarthritis, hypertension, type 2 diabetes mellitus and hyperlipidemia whofollows up for migraine and essential tremor.   UPDATE: He is supposed to have repeat ear surgery which is on-hold due to his anemia  I Migraine: After receiving clearance from his gastroenterologist, I started on topiramate in November.  Headaches are much better overall.  Migraines overall have diminished in intensity and frequency, averaging less than one a week.  However, he reports new migraine semiology that started within last 3 weeks.  He has had a total of 3 over that period of time.  No aura.  He is knocked out for 12 hours at a time.  It is a similar type of headache but more severe.  He tries to sleep throughout.  When he wakes up from sleep, he has a  hangover effect for at least a day.  Cefaly is not effective.  Cannot identify any specific trigger. One time it occurred after eating dinner but not sure if food was a trigger.    Frequency of abortive medication:infrequent Rescue protocol: Ginger tea with Tylenol Current NSAIDS:piroxiicam daily (for arthritis) Current analgesics:no Current triptans:no Current ergotamine:no Current anti-emetic:no Current muscle relaxants:no Current anti-anxiolytic:no Current sleep aide:no Current Antihypertensive medications:Losartan-HCTZ, propranolol ER 13m Current Antidepressant medications:no Current Anticonvulsant medications:topiramate 275mat bedtime Current anti-CGRP:no Current Vitamins/Herbal/Supplements:Ginger tea, biotin, multivitamin, fish oil, coenzyme Q 10, cinnamon, gingerroot, turmeric 500 mg, magnesium 250 mg, riboflavin 400 mg, butterbur 150 mg Current Antihistamines/Decongestants:Flonase Other therapy:Cephaly.Treat acutely with ginger tea and rest.  Caffeine:2 to 3 cups of coffee daily Alcohol:No Smoker:No Diet:Hydrates. No soda. Exercise:Yes Depression:Stable; Anxiety:Stable Other pain:no Sleep hygiene:He wakes up every hour to urinate  II Essential Tremor: He is taking propranolol ER 160 mg daily. Tremors are stable.  HISTORY: I Migraine:  Onset: In his 2068sr 30s. They would occur once or twice a year. In September, they started to occur once or twice a week and then almost daily. Over the past couple of weeks (following tympanostomy tube placement), they have occurred about once a week. Location: Top of head Quality: explode Initial Intensity: 10/10 Aura: Flashing lights/colors in vision Prodrome: no Postdrome: Hangover effect for up to 5 days Associated symptoms: Nausea, photophobia, phonophobia. Denies unilateral numbness and weakness. Initial Duration: Several hours Initial Frequency: In September, they  started to occur once or twice a week and then almost daily.  Over the past couple of weeks (following tympanostomy tube placement), they have occurred about once a week. Triggers: Emotional stress, sound, NTG, light, wine, eye strain Relieving factors: Sleep Activity: Needs to lay down  Past NSAIDS: Celebrex Past analgesics: no Past abortive triptans: Sumatriptan (stopped due to CAD). Past muscle relaxants: no Past anti-emetic: no Past antihypertensive medications: none Past antidepressant medications: Prozac, amitriptyline Past anticonvulsant medications: no Past vitamins/Herbal/Supplements: no Other past therapies: Biofeedback, relaxation therapy  MRI of brain without contrast from 08/11/16 showed mild chronic small vessel ischemic changes but no acute stroke, bleed or mass lesion. However, it did reveal mild mastoiditis. He saw ENT and had tympanostomy tube placement on 09/22/16.   IIEssential Tremor: For over 10 years, he has had a tremor, particularly in his right hand. It has become more noticeable. 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 There is no known family history.  He finds it difficult to swallow the propranolol and wonders if there is a coated version.  Past Medical History: Past Medical History:  Diagnosis Date  . Allergy   . Arthritis   . CAD (coronary artery disease)    s/p PTCI in 2003 and 2004 to the LAD.   Marland Kitchen Cataract   . Chronic tubotympanic suppurative otitis media, bilateral   . Depression   . Diabetes mellitus   . Eustachian tube dysfunction   . History of placement of ear tubes   . Hyperlipidemia   . Hypertension   . Hypothyroid   . Internal hemorrhoids   . Migraine headache   . Skin cancer, basal cell   . Thrombocytopenia ([place])   . Thrombocytopenia, unspecified ([place]) 05/08/2013  . TIA (transient ischemic attack) 2006   per patient's report.  He was never officially given this diagnosis     Medications: Outpatient Encounter Medications as of 12/03/2019  Medication Sig  . aspirin 81 MG tablet Take 81 mg by mouth daily.    Marland Kitchen atorvastatin (LIPITOR) 40 MG tablet Take 1 tablet (40 mg total) by mouth daily at 6 PM.  . azelastine (ASTELIN) 0.1 % nasal spray Place 1 spray into both nostrils 2 (two) times daily. Use in each nostril as directed (Patient taking differently: Place 1 spray into both nostrils daily. Use in each nostril as directed)  . Biotin 1000 MCG tablet Take 1,000 mcg by mouth daily.   . cetirizine (ZYRTEC) 10 MG tablet Take 10 mg by mouth daily.  . cholecalciferol (VITAMIN D3) 25 MCG (1000 UNIT) tablet Take 1,000 Units by mouth daily.  . Cinnamon 500 MG capsule Take 1,000 mg by mouth 2 (two) times daily.   Marland Kitchen CIPRODEX OTIC suspension INT 3 GTS INTO BOTH EARS QHS  . Coenzyme Q10 (COQ-10) 100 MG capsule Take 1 capsule (100 mg total) by mouth daily.  Marland Kitchen ezetimibe (ZETIA) 10 MG tablet Take 1 tablet (10 mg total) by mouth daily.  . ferrous sulfate 325 (65 FE) MG tablet Take 325 mg by mouth daily with breakfast.  . furosemide (LASIX) 40 MG tablet Take 0.5 tablets (20 mg total) by mouth daily.  . Ginger, Zingiber officinalis, (GINGER ROOT) 550 MG CAPS Take 1 capsule by mouth 2 (two) times daily.   Marland Kitchen levothyroxine (SYNTHROID) 112 MCG tablet Take 1 tablet (112 mcg total) by mouth at bedtime.  Marland Kitchen losartan (COZAAR) 50 MG tablet Take 1 tablet (50 mg total) by mouth daily.  . metFORMIN (GLUCOPHAGE) 500 MG tablet TAKE 1 TABLET(500 MG) BY MOUTH TWICE  DAILY WITH A MEAL  . Multiple Vitamins-Minerals (THERAGRAN-M ADVANCED 50 PLUS PO) Take by mouth.  . Nerve Stimulator (CEFALY KIT) DEVI by Does not apply route.  Marland Kitchen OVER THE COUNTER MEDICATION Migra-eeze: Take 1 gel capsule two times daily.  . pantoprazole (PROTONIX) 40 MG tablet TAKE 1 TABLET(40 MG) BY MOUTH DAILY  . piroxicam (FELDENE) 10 MG capsule 1-2 daily prn  . propranolol ER (INDERAL LA) 160 MG SR capsule TAKE 1 CAPSULE(160 MG) BY MOUTH  DAILY  . tamsulosin (FLOMAX) 0.4 MG CAPS capsule Take 1 capsule (0.4 mg total) by mouth daily. Reported on 09/23/2015  . topiramate (TOPAMAX) 25 MG tablet Take 1 tablet (25 mg total) by mouth at bedtime.  . Turmeric 500 MG CAPS Take 2 capsules by mouth 2 (two) times a day.   No facility-administered encounter medications on file as of 12/03/2019.    Allergies: Allergies  Allergen Reactions  . Sulfa Antibiotics Other (See Comments)  . Sulfamethoxazole-Trimethoprim Nausea And Vomiting  . Sulfasalazine Other (See Comments)  . Codeine   . Crestor [Rosuvastatin Calcium]   . Hydrocodone-Acetaminophen     vicodin   . Other     Apples and tomatoes...... Nausea and migraines.    . Rosuvastatin     REACTION: REALLY BAD JOINT/MUSCLE PAIN    Family History: Family History  Problem Relation Age of Onset  . Mental illness Mother        alzheimers  . Heart disease Mother   . Heart disease Father        triple by pass  . Hypertension Brother        Prostate Cancer  . Prostate cancer Brother   . Hypertension Brother   . Hypertension Brother   . COPD Brother   . Colon cancer Neg Hx   . Colon polyps Neg Hx   . Rectal cancer Neg Hx   . Stomach cancer Neg Hx   . Esophageal cancer Neg Hx     Social History: Social History   Socioeconomic History  . Marital status: Married    Spouse name: Charleen  . Number of children: 0  . Years of education: Not on file  . Highest education level: Associate degree: occupational, Hotel manager, or vocational program  Occupational History  . Occupation: retired    Fish farm manager: RETIRED    Comment: accountant  Tobacco Use  . Smoking status: Former Smoker    Years: 5.00    Types: Cigarettes    Quit date: 10/10/1965    Years since quitting: 54.1  . Smokeless tobacco: Never Used  Substance and Sexual Activity  . Alcohol use: No    Alcohol/week: 0.0 standard drinks    Comment: wine rarely  . Drug use: No  . Sexual activity: Yes    Partners: Female   Other Topics Concern  . Not on file  Social History Narrative   Pt lives with spouse at private home in 2 story home- he has no children- right handed- he drinks coffee daily, tea sometimes, soda not all the time. Exercise-- treadmill, stationary bike   Social Determinants of Health   Financial Resource Strain:   . Difficulty of Paying Living Expenses: Not on file  Food Insecurity:   . Worried About Charity fundraiser in the Last Year: Not on file  . Ran Out of Food in the Last Year: Not on file  Transportation Needs:   . Lack of Transportation (Medical): Not on file  . Lack of Transportation (Non-Medical): Not  on file  Physical Activity:   . Days of Exercise per Week: Not on file  . Minutes of Exercise per Session: Not on file  Stress:   . Feeling of Stress : Not on file  Social Connections:   . Frequency of Communication with Friends and Family: Not on file  . Frequency of Social Gatherings with Friends and Family: Not on file  . Attends Religious Services: Not on file  . Active Member of Clubs or Organizations: Not on file  . Attends Archivist Meetings: Not on file  . Marital Status: Not on file  Intimate Partner Violence:   . Fear of Current or Ex-Partner: Not on file  . Emotionally Abused: Not on file  . Physically Abused: Not on file  . Sexually Abused: Not on file    Observations/Objective:   There were no vitals taken for this visit. No acute distress.  Alert and oriented.  Speech fluent and not dysarthric.  Language intact.  Assessment and Plan:   1.  Migraine with aura, without status migrainosus, not intractable.  Initially improved on topiramate but now worsening in regards to severity, duration and unresponsiveness to therapy. 2.  Essential tremor   1. Due to worsening headaches, will order MRI of brain with and without contrast 2. Propranolol ER 113m daily and topiramate for both migraine and tremor prophylaxis.  Will increase topiramate to 533m at bedtime 3.  For abortive therapy, Cefaly 4.  Limit use of pain relievers to no more than 2 days out of week to prevent risk of rebound or medication-overuse headache. 5.  Keep headache diary 6.  Exercise, hydration, caffeine cessation, sleep hygiene, monitor for and avoid triggers 7.  Follow up 4 months.   Follow Up Instructions:    -I discussed the assessment and treatment plan with the patient. The patient was provided an opportunity to ask questions and all were answered. The patient agreed with the plan and demonstrated an understanding of the instructions.   The patient was advised to call back or seek an in-person evaluation if the symptoms worsen or if the condition fails to improve as anticipated.    Total Time spent in visit with the patient was:  13 minutes  AdDudley MajorDO

## 2019-12-02 ENCOUNTER — Telehealth: Payer: Self-pay | Admitting: Family Medicine

## 2019-12-02 DIAGNOSIS — M25511 Pain in right shoulder: Secondary | ICD-10-CM | POA: Diagnosis not present

## 2019-12-02 DIAGNOSIS — S46011D Strain of muscle(s) and tendon(s) of the rotator cuff of right shoulder, subsequent encounter: Secondary | ICD-10-CM | POA: Diagnosis not present

## 2019-12-02 DIAGNOSIS — M25561 Pain in right knee: Secondary | ICD-10-CM | POA: Diagnosis not present

## 2019-12-02 DIAGNOSIS — M6281 Muscle weakness (generalized): Secondary | ICD-10-CM | POA: Diagnosis not present

## 2019-12-02 NOTE — Telephone Encounter (Signed)
Patient states he believe that coding on  office visit on January 14,2021 is incorrect. Patient states that a CPE (annaul visit) is preventive and should be cover under insurance. Please advise.   Pt: [phone]

## 2019-12-03 ENCOUNTER — Other Ambulatory Visit: Payer: Self-pay

## 2019-12-03 ENCOUNTER — Telehealth (INDEPENDENT_AMBULATORY_CARE_PROVIDER_SITE_OTHER): Payer: Medicare Other | Admitting: Neurology

## 2019-12-03 DIAGNOSIS — R519 Headache, unspecified: Secondary | ICD-10-CM

## 2019-12-03 DIAGNOSIS — G250 Essential tremor: Secondary | ICD-10-CM | POA: Diagnosis not present

## 2019-12-03 DIAGNOSIS — G43109 Migraine with aura, not intractable, without status migrainosus: Secondary | ICD-10-CM | POA: Diagnosis not present

## 2019-12-03 MED ORDER — TOPIRAMATE 50 MG PO TABS: 50.0000 mg | ORAL_TABLET | Freq: Every day | ORAL | 5 refills | Status: DC

## 2019-12-05 ENCOUNTER — Other Ambulatory Visit: Payer: Self-pay | Admitting: *Deleted

## 2019-12-05 DIAGNOSIS — IMO0002 Reserved for concepts with insufficient information to code with codable children: Secondary | ICD-10-CM

## 2019-12-05 DIAGNOSIS — E1151 Type 2 diabetes mellitus with diabetic peripheral angiopathy without gangrene: Secondary | ICD-10-CM

## 2019-12-05 MED ORDER — METFORMIN HCL 500 MG PO TABS: ORAL_TABLET | ORAL | 1 refills | Status: DC

## 2019-12-09 DIAGNOSIS — Z85828 Personal history of other malignant neoplasm of skin: Secondary | ICD-10-CM | POA: Diagnosis not present

## 2019-12-09 DIAGNOSIS — L309 Dermatitis, unspecified: Secondary | ICD-10-CM | POA: Diagnosis not present

## 2019-12-09 DIAGNOSIS — D229 Melanocytic nevi, unspecified: Secondary | ICD-10-CM | POA: Diagnosis not present

## 2019-12-09 DIAGNOSIS — L821 Other seborrheic keratosis: Secondary | ICD-10-CM | POA: Diagnosis not present

## 2019-12-09 DIAGNOSIS — L905 Scar conditions and fibrosis of skin: Secondary | ICD-10-CM | POA: Diagnosis not present

## 2019-12-09 DIAGNOSIS — L814 Other melanin hyperpigmentation: Secondary | ICD-10-CM | POA: Diagnosis not present

## 2019-12-09 DIAGNOSIS — D1801 Hemangioma of skin and subcutaneous tissue: Secondary | ICD-10-CM | POA: Diagnosis not present

## 2019-12-19 DIAGNOSIS — H90A32 Mixed conductive and sensorineural hearing loss, unilateral, left ear with restricted hearing on the contralateral side: Secondary | ICD-10-CM | POA: Diagnosis not present

## 2019-12-19 DIAGNOSIS — H6983 Other specified disorders of Eustachian tube, bilateral: Secondary | ICD-10-CM | POA: Diagnosis not present

## 2019-12-19 DIAGNOSIS — H918X2 Other specified hearing loss, left ear: Secondary | ICD-10-CM | POA: Diagnosis not present

## 2019-12-19 DIAGNOSIS — H7013 Chronic mastoiditis, bilateral: Secondary | ICD-10-CM | POA: Diagnosis not present

## 2019-12-19 DIAGNOSIS — H6613 Chronic tubotympanic suppurative otitis media, bilateral: Secondary | ICD-10-CM | POA: Diagnosis not present

## 2019-12-28 ENCOUNTER — Other Ambulatory Visit: Payer: Self-pay | Admitting: Family Medicine

## 2019-12-28 DIAGNOSIS — IMO0002 Reserved for concepts with insufficient information to code with codable children: Secondary | ICD-10-CM

## 2019-12-28 DIAGNOSIS — E1151 Type 2 diabetes mellitus with diabetic peripheral angiopathy without gangrene: Secondary | ICD-10-CM

## 2019-12-30 ENCOUNTER — Other Ambulatory Visit: Payer: Self-pay

## 2019-12-30 MED ORDER — PROPRANOLOL HCL ER 160 MG PO CP24: ORAL_CAPSULE | ORAL | 2 refills | Status: DC

## 2020-01-01 ENCOUNTER — Other Ambulatory Visit: Payer: Self-pay | Admitting: Neurology

## 2020-01-01 MED ORDER — PROPRANOLOL HCL ER 160 MG PO CP24: ORAL_CAPSULE | ORAL | 3 refills | Status: DC

## 2020-01-02 ENCOUNTER — Other Ambulatory Visit: Payer: Self-pay

## 2020-01-02 ENCOUNTER — Other Ambulatory Visit: Payer: Self-pay | Admitting: Neurology

## 2020-01-02 ENCOUNTER — Ambulatory Visit
Admission: RE | Admit: 2020-01-02 | Discharge: 2020-01-02 | Disposition: A | Discharge status: Home / Self Care | Payer: Medicare Other | Source: Ambulatory Visit | Attending: Neurology | Admitting: Neurology

## 2020-01-02 ENCOUNTER — Telehealth: Payer: Self-pay

## 2020-01-02 DIAGNOSIS — G43909 Migraine, unspecified, not intractable, without status migrainosus: Secondary | ICD-10-CM | POA: Diagnosis not present

## 2020-01-02 DIAGNOSIS — R519 Headache, unspecified: Secondary | ICD-10-CM

## 2020-01-02 MED ORDER — GADOBENATE DIMEGLUMINE 529 MG/ML IV SOLN
16.0000 mL | Freq: Once | INTRAVENOUS | Status: AC | PRN
  Administered 2020-01-02: 16 mL via INTRAVENOUS

## 2020-01-02 NOTE — Telephone Encounter (Signed)
Refill had been sent to pharmacy 3/24 and mychart message had been sent to patient informing them refill was sent.  Patient aware.

## 2020-01-02 NOTE — Telephone Encounter (Signed)
Spoke with both the patient and his Spouse, Randell Patient, to let them know I will be emailing our coder to ask for her to look at this DOS to see if it will qualify for a [id number] or [phone] vs [phone] since the patient has traditional MCR.

## 2020-01-02 NOTE — Telephone Encounter (Signed)
Patient wife sent two messages by MyChart about this already they need the medication Propranolol ER 160 mg to the [facility] on [address]

## 2020-01-14 ENCOUNTER — Encounter: Payer: Self-pay | Admitting: Family Medicine

## 2020-01-14 DIAGNOSIS — Z23 Encounter for immunization: Secondary | ICD-10-CM | POA: Diagnosis not present

## 2020-01-27 ENCOUNTER — Other Ambulatory Visit: Payer: Self-pay | Admitting: Gastroenterology

## 2020-02-04 DIAGNOSIS — H9072 Mixed conductive and sensorineural hearing loss, unilateral, left ear, with unrestricted hearing on the contralateral side: Secondary | ICD-10-CM | POA: Diagnosis not present

## 2020-02-04 DIAGNOSIS — H7013 Chronic mastoiditis, bilateral: Secondary | ICD-10-CM | POA: Diagnosis not present

## 2020-02-04 DIAGNOSIS — Z79899 Other long term (current) drug therapy: Secondary | ICD-10-CM | POA: Diagnosis not present

## 2020-02-04 DIAGNOSIS — Z955 Presence of coronary angioplasty implant and graft: Secondary | ICD-10-CM | POA: Diagnosis not present

## 2020-02-04 DIAGNOSIS — Z8673 Personal history of transient ischemic attack (TIA), and cerebral infarction without residual deficits: Secondary | ICD-10-CM | POA: Diagnosis not present

## 2020-02-04 DIAGNOSIS — D509 Iron deficiency anemia, unspecified: Secondary | ICD-10-CM | POA: Diagnosis not present

## 2020-02-04 DIAGNOSIS — H6983 Other specified disorders of Eustachian tube, bilateral: Secondary | ICD-10-CM | POA: Diagnosis not present

## 2020-02-04 DIAGNOSIS — I129 Hypertensive chronic kidney disease with stage 1 through stage 4 chronic kidney disease, or unspecified chronic kidney disease: Secondary | ICD-10-CM | POA: Diagnosis not present

## 2020-02-04 DIAGNOSIS — H9041 Sensorineural hearing loss, unilateral, right ear, with unrestricted hearing on the contralateral side: Secondary | ICD-10-CM | POA: Diagnosis not present

## 2020-02-04 DIAGNOSIS — I2510 Atherosclerotic heart disease of native coronary artery without angina pectoris: Secondary | ICD-10-CM | POA: Diagnosis not present

## 2020-02-04 DIAGNOSIS — N400 Enlarged prostate without lower urinary tract symptoms: Secondary | ICD-10-CM | POA: Diagnosis not present

## 2020-02-04 DIAGNOSIS — H7292 Unspecified perforation of tympanic membrane, left ear: Secondary | ICD-10-CM | POA: Diagnosis not present

## 2020-02-04 DIAGNOSIS — E039 Hypothyroidism, unspecified: Secondary | ICD-10-CM | POA: Diagnosis not present

## 2020-02-04 DIAGNOSIS — G250 Essential tremor: Secondary | ICD-10-CM | POA: Diagnosis not present

## 2020-02-04 DIAGNOSIS — N189 Chronic kidney disease, unspecified: Secondary | ICD-10-CM | POA: Diagnosis not present

## 2020-02-04 DIAGNOSIS — Z87891 Personal history of nicotine dependence: Secondary | ICD-10-CM | POA: Diagnosis not present

## 2020-02-04 DIAGNOSIS — H663X2 Other chronic suppurative otitis media, left ear: Secondary | ICD-10-CM | POA: Diagnosis not present

## 2020-02-04 DIAGNOSIS — D696 Thrombocytopenia, unspecified: Secondary | ICD-10-CM | POA: Diagnosis not present

## 2020-02-04 DIAGNOSIS — K219 Gastro-esophageal reflux disease without esophagitis: Secondary | ICD-10-CM | POA: Diagnosis not present

## 2020-02-04 DIAGNOSIS — E1122 Type 2 diabetes mellitus with diabetic chronic kidney disease: Secondary | ICD-10-CM | POA: Diagnosis not present

## 2020-02-06 ENCOUNTER — Telehealth: Payer: Self-pay | Admitting: Family Medicine

## 2020-02-06 NOTE — Telephone Encounter (Signed)
Kani Macko [phone] for her husband. Jan. 14th is the DOS patient states still isnt resolved. She said yall spoke with you previously and you told her to call back if it wasn't corrected. And Billing told her that the coding doesn't support changing bill.

## 2020-02-09 DIAGNOSIS — H7013 Chronic mastoiditis, bilateral: Secondary | ICD-10-CM | POA: Diagnosis not present

## 2020-02-09 DIAGNOSIS — Z01810 Encounter for preprocedural cardiovascular examination: Secondary | ICD-10-CM | POA: Diagnosis not present

## 2020-02-11 DIAGNOSIS — H7010 Chronic mastoiditis, unspecified ear: Secondary | ICD-10-CM

## 2020-02-11 DIAGNOSIS — H74392 Other acquired abnormalities of left ear ossicles: Secondary | ICD-10-CM | POA: Diagnosis not present

## 2020-02-11 DIAGNOSIS — H7013 Chronic mastoiditis, bilateral: Secondary | ICD-10-CM | POA: Diagnosis not present

## 2020-02-11 DIAGNOSIS — H90A21 Sensorineural hearing loss, unilateral, right ear, with restricted hearing on the contralateral side: Secondary | ICD-10-CM | POA: Diagnosis not present

## 2020-02-11 DIAGNOSIS — H7012 Chronic mastoiditis, left ear: Secondary | ICD-10-CM | POA: Diagnosis not present

## 2020-02-11 DIAGNOSIS — H9072 Mixed conductive and sensorineural hearing loss, unilateral, left ear, with unrestricted hearing on the contralateral side: Secondary | ICD-10-CM | POA: Diagnosis not present

## 2020-02-11 DIAGNOSIS — H7292 Unspecified perforation of tympanic membrane, left ear: Secondary | ICD-10-CM | POA: Diagnosis not present

## 2020-02-11 DIAGNOSIS — H7291 Unspecified perforation of tympanic membrane, right ear: Secondary | ICD-10-CM | POA: Diagnosis not present

## 2020-02-11 DIAGNOSIS — H90A32 Mixed conductive and sensorineural hearing loss, unilateral, left ear with restricted hearing on the contralateral side: Secondary | ICD-10-CM | POA: Diagnosis not present

## 2020-02-11 DIAGNOSIS — H9213 Otorrhea, bilateral: Secondary | ICD-10-CM | POA: Diagnosis not present

## 2020-02-11 DIAGNOSIS — H663X2 Other chronic suppurative otitis media, left ear: Secondary | ICD-10-CM | POA: Diagnosis not present

## 2020-02-11 DIAGNOSIS — H9041 Sensorineural hearing loss, unilateral, right ear, with unrestricted hearing on the contralateral side: Secondary | ICD-10-CM | POA: Diagnosis not present

## 2020-02-11 DIAGNOSIS — H73892 Other specified disorders of tympanic membrane, left ear: Secondary | ICD-10-CM | POA: Diagnosis not present

## 2020-02-11 DIAGNOSIS — H6983 Other specified disorders of Eustachian tube, bilateral: Secondary | ICD-10-CM | POA: Diagnosis not present

## 2020-02-11 HISTORY — DX: Chronic mastoiditis, unspecified ear: H70.10

## 2020-02-12 DIAGNOSIS — H7013 Chronic mastoiditis, bilateral: Secondary | ICD-10-CM | POA: Diagnosis not present

## 2020-02-12 DIAGNOSIS — H6983 Other specified disorders of Eustachian tube, bilateral: Secondary | ICD-10-CM | POA: Diagnosis not present

## 2020-02-12 DIAGNOSIS — H9041 Sensorineural hearing loss, unilateral, right ear, with unrestricted hearing on the contralateral side: Secondary | ICD-10-CM | POA: Diagnosis not present

## 2020-02-12 DIAGNOSIS — H7292 Unspecified perforation of tympanic membrane, left ear: Secondary | ICD-10-CM | POA: Diagnosis not present

## 2020-02-12 DIAGNOSIS — H9072 Mixed conductive and sensorineural hearing loss, unilateral, left ear, with unrestricted hearing on the contralateral side: Secondary | ICD-10-CM | POA: Diagnosis not present

## 2020-02-12 DIAGNOSIS — H663X2 Other chronic suppurative otitis media, left ear: Secondary | ICD-10-CM | POA: Diagnosis not present

## 2020-02-18 NOTE — Telephone Encounter (Signed)
Called patient today, and spoke to his Spouse, Randell Patient, to update him on the billing issue.  As of yesterday, an account note was updated in billing stating the charges had been voided for the patient for DOS 10/24/19.  Randell Patient was very appreciative and I did let her know to please notify me should any other issues arise regarding this DOS and billing.

## 2020-02-20 DIAGNOSIS — Z9089 Acquired absence of other organs: Secondary | ICD-10-CM | POA: Diagnosis not present

## 2020-02-20 DIAGNOSIS — H6983 Other specified disorders of Eustachian tube, bilateral: Secondary | ICD-10-CM | POA: Diagnosis not present

## 2020-02-20 DIAGNOSIS — H905 Unspecified sensorineural hearing loss: Secondary | ICD-10-CM | POA: Diagnosis not present

## 2020-02-20 DIAGNOSIS — H6611 Chronic tubotympanic suppurative otitis media, right ear: Secondary | ICD-10-CM | POA: Diagnosis not present

## 2020-02-20 DIAGNOSIS — H90A21 Sensorineural hearing loss, unilateral, right ear, with restricted hearing on the contralateral side: Secondary | ICD-10-CM | POA: Diagnosis not present

## 2020-02-20 DIAGNOSIS — Z48810 Encounter for surgical aftercare following surgery on the sense organs: Secondary | ICD-10-CM | POA: Diagnosis not present

## 2020-02-20 DIAGNOSIS — Z87891 Personal history of nicotine dependence: Secondary | ICD-10-CM | POA: Diagnosis not present

## 2020-02-20 HISTORY — DX: Acquired absence of other organs: Z90.89

## 2020-03-02 ENCOUNTER — Encounter: Payer: Self-pay | Admitting: Family Medicine

## 2020-03-02 NOTE — Telephone Encounter (Signed)
He can hold it for now -- we should probably recheck cbcd in a month or so.

## 2020-04-07 NOTE — Progress Notes (Signed)
 NEUROLOGY FOLLOW UP OFFICE NOTE  Alison A Addair [id number]  HISTORY OF PRESENT ILLNESS: Kevin A Scaliciis a 73-year-old right-handed male with osteoarthritis, hypertension, type 2 diabetes mellitus and hyperlipidemia whofollows up for migraine and essential tremor.   UPDATE:  I Migraine: Due to worsening headaches, he had an MRI brain with and without contrast was performed on 01/02/2020 which showed no acute intracranial abnormality.  He underwent mastoidectomy and tympanic membrane repair in May.  He is doing well.   Migraines are more controlled.  He had one migraine in May and one in the month of June, lasting a few hours but with postdrome for 2 to 3 days. Frequency of abortive medication:infrequent Rescue protocol: Ginger tea with Tylenol Current NSAIDS:piroxiicam daily (for arthritis) Current analgesics:no Current triptans:no Current ergotamine:no Current anti-emetic:no Current muscle relaxants:no Current anti-anxiolytic:no Current sleep aide:no Current Antihypertensive medications:Losartan-HCTZ, propranolol ER 160mg Current Antidepressant medications:no Current Anticonvulsant medications:topiramate 50mg at bedtime Current anti-CGRP:no Current Vitamins/Herbal/Supplements:Ginger tea, biotin, multivitamin, fish oil, coenzyme Q 10, cinnamon, gingerroot, turmeric 500 mg, magnesium 250 mg, riboflavin 400 mg, butterbur 150 mg Current Antihistamines/Decongestants:Flonase Other therapy:Cephaly.Treat acutely with ginger tea and rest.  Caffeine:2 to 3 cups of coffee daily Alcohol:No Smoker:No Diet:Hydrates. No soda. Exercise:Yes Depression:Stable; Anxiety:Stable Other pain:no Sleep hygiene:He wakes up every hour to urinate  II Essential Tremor: He is taking propranolol ER 160 mg daily. Tremors are stable.  HISTORY: I Migraine: Onset: In his 20s or 30s. They would occur once or twice a year. In September, they started  to occur once or twice a week and then almost daily. Over the past couple of weeks (following tympanostomy tube placement), they have occurred about once a week. Location: Top of head Quality: explode Initial Intensity: 10/10 Aura: Flashing lights/colors in vision Prodrome: no Postdrome: Hangover effect for up to 5 days Associated symptoms: Nausea, photophobia, phonophobia. Denies unilateral numbness and weakness. Initial Duration: Several hours Initial Frequency: In September, they started to occur once or twice a week and then almost daily. Over the past couple of weeks (following tympanostomy tube placement), they have occurred about once a week. Triggers: Emotional stress, sound, NTG, light, wine, eye strain Relieving factors: Sleep Activity: Needs to lay down  Past NSAIDS: Celebrex Past analgesics: no Past abortive triptans: Sumatriptan (stopped due to CAD). Past muscle relaxants: no Past anti-emetic: no Past antihypertensive medications: none Past antidepressant medications: Prozac, amitriptyline Past anticonvulsant medications: no Past vitamins/Herbal/Supplements: no Other past therapies: Biofeedback, relaxation therapy  MRI of brain without contrast from 08/11/16 showed mild chronic small vessel ischemic changes but no acute stroke, bleed or mass lesion. However, it did reveal mild mastoiditis. He saw ENT and had tympanostomy tube placement on 09/22/16.   IIEssential Tremor: For over 10 years, he has had a tremor, particularly in his right hand. It has become more noticeable. 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 There is no known family history.   PAST MEDICAL HISTORY: Past Medical History:  Diagnosis Date  . Allergy   . Arthritis   . CAD (coronary artery disease)    s/p PTCI in 2003 and 2004 to the LAD.   . Cataract   . Chronic tubotympanic suppurative otitis media, bilateral   . Depression   . Diabetes  mellitus   . Eustachian tube dysfunction   . History of placement of ear tubes   . Hyperlipidemia   . Hypertension   . Hypothyroid   . Internal hemorrhoids   . Migraine headache   .   Skin cancer, basal cell   . Thrombocytopenia (HCC)   . Thrombocytopenia, unspecified (HCC) 05/08/2013  . TIA (transient ischemic attack) 2006   per patient's report.  He was never officially given this diagnosis    MEDICATIONS: Current Outpatient Medications on File Prior to Visit  Medication Sig Dispense Refill  . aspirin 81 MG tablet Take 81 mg by mouth daily.      . atorvastatin (LIPITOR) 40 MG tablet Take 1 tablet (40 mg total) by mouth daily at 6 PM. 30 tablet 5  . azelastine (ASTELIN) 0.1 % nasal spray Place 1 spray into both nostrils 2 (two) times daily. Use in each nostril as directed (Patient taking differently: Place 1 spray into both nostrils daily. Use in each nostril as directed) 30 mL 12  . Biotin 1000 MCG tablet Take 1,000 mcg by mouth daily.     . cetirizine (ZYRTEC) 10 MG tablet Take 10 mg by mouth daily.    . cholecalciferol (VITAMIN D3) 25 MCG (1000 UNIT) tablet Take 1,000 Units by mouth daily.    . Cinnamon 500 MG capsule Take 1,000 mg by mouth 2 (two) times daily.     . CIPRODEX OTIC suspension INT 3 GTS INTO BOTH EARS QHS    . Coenzyme Q10 (COQ-10) 100 MG capsule Take 1 capsule (100 mg total) by mouth daily.    . ezetimibe (ZETIA) 10 MG tablet Take 1 tablet (10 mg total) by mouth daily. 90 tablet 1  . ferrous sulfate 325 (65 FE) MG tablet Take 325 mg by mouth daily with breakfast.    . furosemide (LASIX) 40 MG tablet Take 0.5 tablets (20 mg total) by mouth daily. 45 tablet 3  . Ginger, Zingiber officinalis, (GINGER ROOT) 550 MG CAPS Take 1 capsule by mouth 2 (two) times daily.     . levothyroxine (SYNTHROID) 112 MCG tablet Take 1 tablet (112 mcg total) by mouth at bedtime. 90 tablet 1  . losartan (COZAAR) 50 MG tablet Take 1 tablet (50 mg total) by mouth daily. 90 tablet 3  .  metFORMIN (GLUCOPHAGE) 500 MG tablet TAKE ONE TABLET BY MOUTH TWICE A DAY 180 tablet 1  . Multiple Vitamins-Minerals (THERAGRAN-M ADVANCED 50 PLUS PO) Take by mouth.    . Nerve Stimulator (CEFALY KIT) DEVI by Does not apply route.    . OVER THE COUNTER MEDICATION Migra-eeze: Take 1 gel capsule two times daily.    . pantoprazole (PROTONIX) 40 MG tablet TAKE ONE TABLET BY MOUTH DAILY 90 tablet 0  . piroxicam (FELDENE) 10 MG capsule 1-2 daily prn 200 capsule 1  . propranolol ER (INDERAL LA) 160 MG SR capsule One tablet po qd 90 capsule 3  . tamsulosin (FLOMAX) 0.4 MG CAPS capsule Take 1 capsule (0.4 mg total) by mouth daily. Reported on 09/23/2015 30 capsule   . topiramate (TOPAMAX) 50 MG tablet Take 1 tablet (50 mg total) by mouth at bedtime. 30 tablet 5  . Turmeric 500 MG CAPS Take 2 capsules by mouth 2 (two) times a day.     No current facility-administered medications on file prior to visit.    ALLERGIES: Allergies  Allergen Reactions  . Sulfa Antibiotics Other (See Comments)  . Sulfamethoxazole-Trimethoprim Nausea And Vomiting  . Sulfasalazine Other (See Comments)  . Codeine   . Crestor [Rosuvastatin Calcium]   . Hydrocodone-Acetaminophen     vicodin   . Other     Apples and tomatoes...... Nausea and migraines.    . Rosuvastatin       REACTION: REALLY BAD JOINT/MUSCLE PAIN    FAMILY HISTORY: Family History  Problem Relation Age of Onset  . Mental illness Mother        alzheimers  . Heart disease Mother   . Heart disease Father        triple by pass  . Hypertension Brother        Prostate Cancer  . Prostate cancer Brother   . Hypertension Brother   . Hypertension Brother   . COPD Brother   . Colon cancer Neg Hx   . Colon polyps Neg Hx   . Rectal cancer Neg Hx   . Stomach cancer Neg Hx   . Esophageal cancer Neg Hx     SOCIAL HISTORY: Social History   Socioeconomic History  . Marital status: Married    Spouse name: Charleen  . Number of children: 0  . Years of  education: Not on file  . Highest education level: Associate degree: occupational, Hotel manager, or vocational program  Occupational History  . Occupation: retired    Fish farm manager: RETIRED    Comment: accountant  Tobacco Use  . Smoking status: Former Smoker    Years: 5.00    Types: Cigarettes    Quit date: 10/10/1965    Years since quitting: 54.5  . Smokeless tobacco: Never Used  Vaping Use  . Vaping Use: Never used  Substance and Sexual Activity  . Alcohol use: No    Alcohol/week: 0.0 standard drinks    Comment: wine rarely  . Drug use: No  . Sexual activity: Yes    Partners: Female  Other Topics Concern  . Not on file  Social History Narrative   Pt lives with spouse at private home in 2 story home- he has no children- right handed- he drinks coffee daily, tea sometimes, soda not all the time. Exercise-- treadmill, stationary bike   Social Determinants of Health   Financial Resource Strain:   . Difficulty of Paying Living Expenses:   Food Insecurity:   . Worried About Charity fundraiser in the Last Year:   . Arboriculturist in the Last Year:   Transportation Needs:   . Film/video editor (Medical):   Marland Kitchen Lack of Transportation (Non-Medical):   Physical Activity:   . Days of Exercise per Week:   . Minutes of Exercise per Session:   Stress:   . Feeling of Stress :   Social Connections:   . Frequency of Communication with Friends and Family:   . Frequency of Social Gatherings with Friends and Family:   . Attends Religious Services:   . Active Member of Clubs or Organizations:   . Attends Archivist Meetings:   Marland Kitchen Marital Status:   Intimate Partner Violence:   . Fear of Current or Ex-Partner:   . Emotionally Abused:   Marland Kitchen Physically Abused:   . Sexually Abused:     PHYSICAL EXAM: Blood pressure (!) 144/72, pulse 62, height 5' 9" (1.753 m), weight 161 lb 3.2 oz (73.1 kg), SpO2 98 %. General: No acute distress.  Patient appears well-groomed.   Head:   Normocephalic/atraumatic Eyes:  Fundi examined but not visualized Neck: supple, no paraspinal tenderness, full range of motion Heart:  Regular rate and rhythm Lungs:  Clear to auscultation bilaterally Back: No paraspinal tenderness Neurological Exam: alert and oriented to person, place, and time. Attention span and concentration intact, recent and remote memory intact, fund of knowledge intact.  Speech fluent and not dysarthric, language intact.  Decreased hearing in left ear.  Otherwise, CN II-XII intact. Bulk and tone normal, muscle strength 5/5 throughout.  Sensation to light touch, temperature and vibration intact.  Deep tendon reflexes 2+ throughout, toes downgoing.  Finger to nose and heel to shin testing intact.  Gait normal, Romberg negative.  IMPRESSION: 1.  Migraine without aura, without status migrainosus, not intractable 2.  Essential tremor  PLAN: 1.  For migraine preventative and tremor management:  Propranolol ER 134m daily and topiramate 532mat bedtime 2.  For abortive therapy, Cefaly 3.  Limit use of pain relievers to no more than 2 days out of week to prevent risk of rebound or medication-overuse headache. 4.  Keep headache diary 5.  Exercise, hydration, caffeine cessation, sleep hygiene, monitor for triggers. 6. Follow up 6 months.   AdMetta ClinesDO  CC: YvRoma SchanzDO

## 2020-04-09 ENCOUNTER — Encounter: Payer: Self-pay | Admitting: Neurology

## 2020-04-09 ENCOUNTER — Other Ambulatory Visit: Payer: Self-pay

## 2020-04-09 ENCOUNTER — Ambulatory Visit (INDEPENDENT_AMBULATORY_CARE_PROVIDER_SITE_OTHER): Payer: Medicare Other | Admitting: Neurology

## 2020-04-09 VITALS — BP 144/72 | HR 62 | Ht 69.0 in | Wt 161.2 lb

## 2020-04-09 DIAGNOSIS — I2510 Atherosclerotic heart disease of native coronary artery without angina pectoris: Secondary | ICD-10-CM | POA: Diagnosis not present

## 2020-04-09 DIAGNOSIS — G43109 Migraine with aura, not intractable, without status migrainosus: Secondary | ICD-10-CM | POA: Diagnosis not present

## 2020-04-09 DIAGNOSIS — Z9861 Coronary angioplasty status: Secondary | ICD-10-CM | POA: Diagnosis not present

## 2020-04-09 DIAGNOSIS — G250 Essential tremor: Secondary | ICD-10-CM

## 2020-04-09 NOTE — Patient Instructions (Signed)
Continue current management

## 2020-04-19 ENCOUNTER — Other Ambulatory Visit: Payer: Self-pay | Admitting: Cardiology

## 2020-04-19 ENCOUNTER — Other Ambulatory Visit: Payer: Self-pay | Admitting: Gastroenterology

## 2020-04-23 ENCOUNTER — Ambulatory Visit (INDEPENDENT_AMBULATORY_CARE_PROVIDER_SITE_OTHER): Payer: Medicare Other | Admitting: Family Medicine

## 2020-04-23 ENCOUNTER — Other Ambulatory Visit: Payer: Self-pay

## 2020-04-23 ENCOUNTER — Encounter: Payer: Self-pay | Admitting: Family Medicine

## 2020-04-23 VITALS — BP 140/68 | HR 54 | Temp 97.7°F | Resp 18 | Ht 69.0 in | Wt 158.4 lb

## 2020-04-23 DIAGNOSIS — I2510 Atherosclerotic heart disease of native coronary artery without angina pectoris: Secondary | ICD-10-CM

## 2020-04-23 DIAGNOSIS — M159 Polyosteoarthritis, unspecified: Secondary | ICD-10-CM | POA: Diagnosis not present

## 2020-04-23 DIAGNOSIS — Z9861 Coronary angioplasty status: Secondary | ICD-10-CM | POA: Diagnosis not present

## 2020-04-23 DIAGNOSIS — Z8719 Personal history of other diseases of the digestive system: Secondary | ICD-10-CM | POA: Diagnosis not present

## 2020-04-23 DIAGNOSIS — E785 Hyperlipidemia, unspecified: Secondary | ICD-10-CM | POA: Diagnosis not present

## 2020-04-23 DIAGNOSIS — I10 Essential (primary) hypertension: Secondary | ICD-10-CM | POA: Diagnosis not present

## 2020-04-23 DIAGNOSIS — E039 Hypothyroidism, unspecified: Secondary | ICD-10-CM

## 2020-04-23 DIAGNOSIS — E1169 Type 2 diabetes mellitus with other specified complication: Secondary | ICD-10-CM | POA: Diagnosis not present

## 2020-04-23 DIAGNOSIS — E1165 Type 2 diabetes mellitus with hyperglycemia: Secondary | ICD-10-CM

## 2020-04-23 HISTORY — DX: Personal history of other diseases of the digestive system: Z87.19

## 2020-04-23 LAB — COMPREHENSIVE METABOLIC PANEL
ALT: 16 U/L (ref 0–53)
AST: 17 U/L (ref 0–37)
Albumin: 3.5 g/dL (ref 3.5–5.2)
Alkaline Phosphatase: 61 U/L (ref 39–117)
BUN: 24 mg/dL — ABNORMAL HIGH (ref 6–23)
CO2: 25 mEq/L (ref 19–32)
Calcium: 8.4 mg/dL (ref 8.4–10.5)
Chloride: 111 mEq/L (ref 96–112)
Creatinine, Ser: 1.18 mg/dL (ref 0.40–1.50)
GFR: 59.98 mL/min — ABNORMAL LOW (ref >60.00)
Glucose, Bld: 105 mg/dL — ABNORMAL HIGH (ref 70–99)
Potassium: 4.1 mEq/L (ref 3.5–5.1)
Sodium: 142 mEq/L (ref 135–145)
Total Bilirubin: 0.6 mg/dL (ref 0.2–1.2)
Total Protein: 5.2 g/dL — ABNORMAL LOW (ref 6.0–8.3)

## 2020-04-23 LAB — CBC WITH DIFFERENTIAL/PLATELET
Basophils Absolute: 0 10*3/uL (ref 0.0–0.1)
Basophils Relative: 1 % (ref 0.0–3.0)
Eosinophils Absolute: 0.6 10*3/uL (ref 0.0–0.7)
Eosinophils Relative: 12.8 % — ABNORMAL HIGH (ref 0.0–5.0)
HCT: 39.1 % (ref 39.0–52.0)
Hemoglobin: 13.3 g/dL (ref 13.0–17.0)
Lymphocytes Relative: 17.4 % (ref 12.0–46.0)
Lymphs Abs: 0.8 10*3/uL (ref 0.7–4.0)
MCHC: 34 g/dL (ref 30.0–36.0)
MCV: 90.8 fl (ref 78.0–100.0)
Monocytes Absolute: 0.4 10*3/uL (ref 0.1–1.0)
Monocytes Relative: 8.7 % (ref 3.0–12.0)
Neutro Abs: 2.9 10*3/uL (ref 1.4–7.7)
Neutrophils Relative %: 60.1 % (ref 43.0–77.0)
Platelets: 145 10*3/uL — ABNORMAL LOW (ref 150.0–400.0)
RBC: 4.31 Mil/uL (ref 4.22–5.81)
RDW: 12.6 % (ref 11.5–15.5)
WBC: 4.9 10*3/uL (ref 4.0–10.5)

## 2020-04-23 LAB — MICROALBUMIN / CREATININE URINE RATIO
Creatinine,U: 180.8 mg/dL
Microalb Creat Ratio: 1 mg/g (ref 0.0–30.0)
Microalb, Ur: 1.9 mg/dL (ref 0.0–1.9)

## 2020-04-23 LAB — LIPID PANEL
Cholesterol: 87 mg/dL (ref 0–200)
HDL: 44.1 mg/dL (ref >39.00)
LDL Cholesterol: 32 mg/dL (ref 0–99)
NonHDL: 42.64
Total CHOL/HDL Ratio: 2
Triglycerides: 52 mg/dL (ref 0.0–149.0)
VLDL: 10.4 mg/dL (ref 0.0–40.0)

## 2020-04-23 LAB — TSH: TSH: 1.19 u[IU]/mL (ref 0.35–4.50)

## 2020-04-23 LAB — HEMOGLOBIN A1C: Hgb A1c MFr Bld: 5.2 % (ref 4.6–6.5)

## 2020-04-23 MED ORDER — DICLOFENAC SODIUM 3 % EX GEL: CUTANEOUS | 3 refills | Status: DC

## 2020-04-23 MED ORDER — CELECOXIB 200 MG PO CAPS: 200.0000 mg | ORAL_CAPSULE | Freq: Every day | ORAL | 1 refills | Status: DC

## 2020-04-23 MED ORDER — LEVOTHYROXINE SODIUM 112 MCG PO TABS: 112.0000 ug | ORAL_TABLET | Freq: Every day | ORAL | 1 refills | Status: DC

## 2020-04-23 NOTE — Assessment & Plan Note (Signed)
Well controlled, no changes to meds. Encouraged heart healthy diet such as the DASH diet and exercise as tolerated.  °

## 2020-04-23 NOTE — Assessment & Plan Note (Signed)
Tolerating statin, encouraged heart healthy diet, avoid trans fats, minimize simple carbs and saturated fats. Increase exercise as tolerated 

## 2020-04-23 NOTE — Assessment & Plan Note (Signed)
Check labs today.

## 2020-04-23 NOTE — Patient Instructions (Signed)
Carbohydrate Counting for Diabetes Mellitus, Adult  Carbohydrate counting is a method of keeping track of how many carbohydrates you eat. Eating carbohydrates naturally increases the amount of sugar (glucose) in the blood. Counting how many carbohydrates you eat helps keep your blood glucose within normal limits, which helps you manage your diabetes (diabetes mellitus). It is important to know how many carbohydrates you can safely have in each meal. This is different for every person. A diet and nutrition specialist (registered dietitian) can help you make a meal plan and calculate how many carbohydrates you should have at each meal and snack. Carbohydrates are found in the following foods:  Grains, such as breads and cereals.  Dried beans and soy products.  Starchy vegetables, such as potatoes, peas, and corn.  Fruit and fruit juices.  Milk and yogurt.  Sweets and snack foods, such as cake, cookies, candy, chips, and soft drinks. How do I count carbohydrates? There are two ways to count carbohydrates in food. You can use either of the methods or a combination of both. Reading "Nutrition Facts" on packaged food The "Nutrition Facts" list is included on the labels of almost all packaged foods and beverages in the [place] It includes:  The serving size.  Information about nutrients in each serving, including the grams (g) of carbohydrate per serving. To use the "Nutrition Facts":  Decide how many servings you will have.  Multiply the number of servings by the number of carbohydrates per serving.  The resulting number is the total amount of carbohydrates that you will be having. Learning standard serving sizes of other foods When you eat carbohydrate foods that are not packaged or do not include "Nutrition Facts" on the label, you need to measure the servings in order to count the amount of carbohydrates:  Measure the foods that you will eat with a food scale or measuring cup, if  needed.  Decide how many standard-size servings you will eat.  Multiply the number of servings by 15. Most carbohydrate-rich foods have about 15 g of carbohydrates per serving. ? For example, if you eat 8 oz (170 g) of strawberries, you will have eaten 2 servings and 30 g of carbohydrates (2 servings x 15 g = 30 g).  For foods that have more than one food mixed, such as soups and casseroles, you must count the carbohydrates in each food that is included. The following list contains standard serving sizes of common carbohydrate-rich foods. Each of these servings has about 15 g of carbohydrates:   hamburger bun or  English muffin.   oz (15 mL) syrup.   oz (14 g) jelly.  1 slice of bread.  1 six-inch tortilla.  3 oz (85 g) cooked rice or pasta.  4 oz (113 g) cooked dried beans.  4 oz (113 g) starchy vegetable, such as peas, corn, or potatoes.  4 oz (113 g) hot cereal.  4 oz (113 g) mashed potatoes or  of a large baked potato.  4 oz (113 g) canned or frozen fruit.  4 oz (120 mL) fruit juice.  4-6 crackers.  6 chicken nuggets.  6 oz (170 g) unsweetened dry cereal.  6 oz (170 g) plain fat-free yogurt or yogurt sweetened with artificial sweeteners.  8 oz (240 mL) milk.  8 oz (170 g) fresh fruit or one small piece of fruit.  24 oz (680 g) popped popcorn. Example of carbohydrate counting Sample meal  3 oz (85 g) chicken breast.  6 oz (170 g)   brown rice.  4 oz (113 g) corn.  8 oz (240 mL) milk.  8 oz (170 g) strawberries with sugar-free whipped topping. Carbohydrate calculation 1. Identify the foods that contain carbohydrates: ? Rice. ? Corn. ? Milk. ? Strawberries. 2. Calculate how many servings you have of each food: ? 2 servings rice. ? 1 serving corn. ? 1 serving milk. ? 1 serving strawberries. 3. Multiply each number of servings by 15 g: ? 2 servings rice x 15 g = 30 g. ? 1 serving corn x 15 g = 15 g. ? 1 serving milk x 15 g = 15 g. ? 1  serving strawberries x 15 g = 15 g. 4. Add together all of the amounts to find the total grams of carbohydrates eaten: ? 30 g + 15 g + 15 g + 15 g = 75 g of carbohydrates total. Summary  Carbohydrate counting is a method of keeping track of how many carbohydrates you eat.  Eating carbohydrates naturally increases the amount of sugar (glucose) in the blood.  Counting how many carbohydrates you eat helps keep your blood glucose within normal limits, which helps you manage your diabetes.  A diet and nutrition specialist (registered dietitian) can help you make a meal plan and calculate how many carbohydrates you should have at each meal and snack. This information is not intended to replace advice given to you by your health care provider. Make sure you discuss any questions you have with your health care provider. Document Revised: 04/20/2017 Document Reviewed: 03/09/2016 Elsevier Patient Education  2020 [employer]  

## 2020-04-23 NOTE — Progress Notes (Signed)
Patient ID: [name], male    DOB: [birth year]  Age: 74 y.o. MRN: [id number]    Subjective:  Subjective  HPI [name] presents for f/u dm , chol and htn                                  HYPERTENSION   Blood pressure range-good per pt   Chest pain- no      Dyspnea- no Lightheadedness- no   Edema- no  Other side effects - no   Medication compliance: good Low salt diet- yes    DIABETES    Blood Sugar ranges-not checking--- running high during ear surgery  Polyuria- no New Visual problems- no  Hypoglycemic symptoms- no  Other side effects-no Medication compliance - good Last eye exam- dec 2020 Foot exam- today   HYPERLIPIDEMIA  Medication compliance- good  RUQ pain- no  Muscle aches- no Other side effects-no      Review of Systems  Constitutional: Negative for appetite change, diaphoresis, fatigue and unexpected weight change.  Eyes: Negative for pain, redness and visual disturbance.  Respiratory: Negative for cough, chest tightness, shortness of breath and wheezing.   Cardiovascular: Negative for chest pain, palpitations and leg swelling.  Endocrine: Negative for cold intolerance, heat intolerance, polydipsia, polyphagia and polyuria.  Genitourinary: Negative for difficulty urinating, dysuria and frequency.  Neurological: Negative for dizziness, light-headedness, numbness and headaches.    History Past Medical History:  Diagnosis Date  . Allergy   . Arthritis   . CAD (coronary artery disease)    s/p PTCI in 2003 and 2004 to the LAD.   Marland Kitchen Cataract   . Chronic tubotympanic suppurative otitis media, bilateral   . Depression   . Diabetes mellitus   . Eustachian tube dysfunction   . History of placement of ear tubes   . Hyperlipidemia   . Hypertension   . Hypothyroid   . Internal hemorrhoids   . Migraine headache   . Skin cancer, basal cell   . Thrombocytopenia ([place])   . Thrombocytopenia, unspecified ([place]) 05/08/2013  . TIA (transient ischemic attack)  2006   per patient's report.  He was never officially given this diagnosis    He has a past surgical history that includes Hernia repair (2007); fatty tissue removed; Thyroidectomy, partial (07/2002); coronary artery disease status post placement; right toe bone spur surgery; Tonsillectomy; Colonoscopy (2011); Polypectomy (2011); Upper gi endoscopy; esophogeal dilation; and mastoidectomy with tympanoplasty.   His family history includes COPD in his brother; Heart disease in his father and mother; Hypertension in his brother, brother, and brother; Mental illness in his mother; Prostate cancer in his brother.He reports that he quit smoking about 54 years ago. His smoking use included cigarettes. He quit after 5.00 years of use. He has never used smokeless tobacco. He reports that he does not drink alcohol and does not use drugs.  Current Outpatient Medications on File Prior to Visit  Medication Sig Dispense Refill  . aspirin 81 MG tablet Take 81 mg by mouth daily.      Marland Kitchen atorvastatin (LIPITOR) 40 MG tablet TAKE ONE TABLET BY MOUTH DAILY AT 6PM 30 tablet 4  . azelastine (ASTELIN) 0.1 % nasal spray Place 1 spray into both nostrils 2 (two) times daily. Use in each nostril as directed (Patient taking differently: Place 1 spray into both nostrils daily. Use in each nostril as directed) 30 mL 12  .  Biotin 1000 MCG tablet Take 1,000 mcg by mouth daily.     . cholecalciferol (VITAMIN D3) 25 MCG (1000 UNIT) tablet Take 1,000 Units by mouth daily.    . Cinnamon 500 MG capsule Take 1,000 mg by mouth 2 (two) times daily.     . Coenzyme Q10 (COQ-10) 100 MG capsule Take 1 capsule (100 mg total) by mouth daily.    Marland Kitchen ezetimibe (ZETIA) 10 MG tablet Take 1 tablet (10 mg total) by mouth daily. 90 tablet 1  . Ginger, Zingiber officinalis, (GINGER ROOT) 550 MG CAPS Take 1 capsule by mouth 2 (two) times daily.     Marland Kitchen losartan (COZAAR) 50 MG tablet TAKE ONE TABLET BY MOUTH DAILY 90 tablet 0  . metFORMIN (GLUCOPHAGE) 500  MG tablet TAKE ONE TABLET BY MOUTH TWICE A DAY 180 tablet 1  . Multiple Vitamins-Minerals (THERAGRAN-M ADVANCED 50 PLUS PO) Take by mouth.    . Nerve Stimulator (CEFALY KIT) DEVI by Does not apply route.    Marland Kitchen OVER THE COUNTER MEDICATION Migra-eeze: Take 1 gel capsule two times daily.    . pantoprazole (PROTONIX) 40 MG tablet TAKE ONE TABLET BY MOUTH DAILY 90 tablet 0  . propranolol ER (INDERAL LA) 160 MG SR capsule One tablet po qd 90 capsule 3  . tamsulosin (FLOMAX) 0.4 MG CAPS capsule Take 1 capsule (0.4 mg total) by mouth daily. Reported on 09/23/2015 30 capsule   . topiramate (TOPAMAX) 50 MG tablet Take 1 tablet (50 mg total) by mouth at bedtime. 30 tablet 5  . Turmeric 500 MG CAPS Take 2 capsules by mouth 2 (two) times a day.    . cetirizine (ZYRTEC) 10 MG tablet Take 10 mg by mouth daily.    Marland Kitchen CIPRODEX OTIC suspension INT 3 GTS INTO BOTH EARS QHS    . ferrous sulfate 325 (65 FE) MG tablet Take 325 mg by mouth daily with breakfast.    . furosemide (LASIX) 40 MG tablet Take 0.5 tablets (20 mg total) by mouth daily. 45 tablet 3  . piroxicam (FELDENE) 10 MG capsule 1-2 daily prn 200 capsule 1   No current facility-administered medications on file prior to visit.     Objective:  Objective  Physical Exam Vitals and nursing note reviewed.  Constitutional:      General: He is sleeping.     Appearance: He is well-developed.  HENT:     Head: Normocephalic and atraumatic.  Eyes:     Pupils: Pupils are equal, round, and reactive to light.  Neck:     Thyroid: No thyromegaly.  Cardiovascular:     Rate and Rhythm: Normal rate and regular rhythm.     Heart sounds: No murmur heard.   Pulmonary:     Effort: Pulmonary effort is normal. No respiratory distress.     Breath sounds: Normal breath sounds. No wheezing or rales.  Chest:     Chest wall: No tenderness.  Musculoskeletal:        General: No tenderness.     Cervical back: Normal range of motion and neck supple.  Skin:    General:  Skin is warm and dry.  Neurological:     Mental Status: He is oriented to person, place, and time.  Psychiatric:        Behavior: Behavior normal.        Thought Content: Thought content normal.        Judgment: Judgment normal.    Diabetic Foot Exam - Simple   Simple  Foot Form Diabetic Foot exam was performed with the following findings: Yes 04/23/2020  1:32 PM  Visual Inspection No deformities, no ulcerations, no other skin breakdown bilaterally: Yes Sensation Testing Intact to touch and monofilament testing bilaterally: Yes Pulse Check Posterior Tibialis and Dorsalis pulse intact bilaterally: Yes Comments                                                                                                                                                                                                 BP 140/68 (BP Location: Right Arm, Patient Position: Sitting, Cuff Size: Normal)   Pulse (!) 54   Temp 97.7 F (36.5 C) (Oral)   Resp 18   Ht '5\' 9"'  (1.753 m)   Wt 158 lb 6.4 oz (71.8 kg)   SpO2 98%   BMI 23.39 kg/m  Wt Readings from Last 3 Encounters:  04/23/20 158 lb 6.4 oz (71.8 kg)  04/09/20 161 lb 3.2 oz (73.1 kg)  10/24/19 171 lb 6.4 oz (77.7 kg)     Lab Results  Component Value Date   WBC 4.8 10/23/2019   HGB 12.7 (L) 10/23/2019   HCT 39.8 10/23/2019   PLT 169.0 10/23/2019   GLUCOSE 116 (H) 10/23/2019   CHOL 94 10/23/2019   TRIG 54.0 10/23/2019   HDL 45.50 10/23/2019   LDLCALC 38 10/23/2019   ALT 17 10/23/2019   AST 19 10/23/2019   NA 141 10/23/2019   K 4.0 10/23/2019   CL 110 10/23/2019   CREATININE 1.40 10/23/2019   BUN 24 (H) 10/23/2019   CO2 26 10/23/2019   TSH 3.14 10/23/2019   PSA 5.08 (H) 10/18/2018   INR 1.0 07/01/2019   HGBA1C 5.5 10/23/2019   MICROALBUR 1.3  04/22/2019    MR BRAIN W WO CONTRAST  Result Date: 01/02/2020 CLINICAL DATA:  Worsening headaches. Additional history provided by technologist: Patient reports chronic migraine headaches, recently worsening, history of basal cell skin cancer, history of multiple TIAs. Creatinine was obtained on site at [facility] at [address] Results: Creatinine 1.2 mg/dL (GFR 59). EXAM: MRI HEAD WITHOUT AND WITH CONTRAST TECHNIQUE: Multiplanar, multiecho pulse sequences of the brain and surrounding structures were obtained without and with intravenous contrast. CONTRAST:  31m MULTIHANCE GADOBENATE DIMEGLUMINE 529 MG/ML IV SOLN COMPARISON:  Brain MRI 08/11/2016. FINDINGS: Brain: There is no evidence of acute infarct. No evidence of intracranial mass. No midline shift or extra-axial fluid collection. No chronic intracranial blood products. Minimal scattered and ill-defined T2/FLAIR hyperintensity within the cerebral white matter is similar to prior MRI 08/11/2016. Findings are nonspecific but consistent with chronic  small vessel ischemic disease. No abnormal intracranial enhancement. Cerebral volume is normal for age. Vascular: Flow voids maintained within the proximal large arterial vessels. Expected enhancement within the dural venous sinuses. Skull and upper cervical spine: No focal marrow lesion. Sinuses/Orbits: Visualized orbits demonstrate no acute abnormality. Minimal ethmoid sinus mucosal thickening. Small left maxillary sinus mucous retention cysts. Bilateral mastoid effusions. IMPRESSION: 1. No evidence of intracranial mass or acute intracranial abnormality. 2. Minimal chronic small vessel ischemic changes within the cerebral white matter, similar to prior MRI 08/11/2016. 3. Mild ethmoid sinus mucosal thickening. Small left maxillary sinus mucous retention cyst. 4. Bilateral mastoid effusions. Electronically Signed   By: Kellie Simmering DO   On: 01/02/2020 08:53     Assessment & Plan:  Plan  I  have changed [name] "Joe"'s celecoxib. I am also having him start on Diclofenac Sodium. Additionally, I am having him maintain his aspirin, Biotin, Cinnamon, Ginger Root, tamsulosin, OVER THE COUNTER MEDICATION, Cefaly Kit, azelastine, Multiple Vitamins-Minerals (THERAGRAN-M ADVANCED 50 PLUS PO), Ciprodex, cetirizine, CoQ-10, Turmeric, ferrous sulfate, furosemide, cholecalciferol, ezetimibe, piroxicam, topiramate, metFORMIN, propranolol ER, atorvastatin, pantoprazole, losartan, and levothyroxine.  Meds ordered this encounter  Medications  . levothyroxine (SYNTHROID) 112 MCG tablet    Sig: Take 1 tablet (112 mcg total) by mouth at bedtime.    Dispense:  90 tablet    Refill:  1  . celecoxib (CELEBREX) 200 MG capsule    Sig: Take 1 capsule (200 mg total) by mouth daily.    Dispense:  90 capsule    Refill:  1    Pt request Rx be placed on file. Will call for refill.  . Diclofenac Sodium 3 % GEL    Sig: Qid prn    Dispense:  150 g    Refill:  3    Problem List Items Addressed This Visit      Unprioritized   Essential hypertension    Well controlled, no changes to meds. Encouraged heart healthy diet such as the DASH diet and exercise as tolerated.       History of GI bleed   Relevant Orders   CBC with Differential/Platelet   Hyperlipidemia associated with type 2 diabetes mellitus ([place])    Tolerating statin, encouraged heart healthy diet, avoid trans fats, minimize simple carbs and saturated fats. Increase exercise as tolerated      Relevant Orders   Lipid panel   Comprehensive metabolic panel   Microalbumin / creatinine urine ratio   Hypothyroidism    Check labs today      Relevant Medications   levothyroxine (SYNTHROID) 112 MCG tablet   Other Relevant Orders   Comprehensive metabolic panel   TSH   Osteoarthritis of multiple joints   Relevant Medications   celecoxib (CELEBREX) 200 MG capsule   Uncontrolled type 2 diabetes mellitus with hyperglycemia (HCC) - Primary     Relevant Orders   Lipid panel   Hemoglobin A1c   Comprehensive metabolic panel   Microalbumin / creatinine urine ratio      Follow-up: Return in about 6 months (around 10/24/2020), or if symptoms worsen or fail to improve, for annual exam, fasting.  [name], DO

## 2020-04-24 ENCOUNTER — Telehealth: Payer: Self-pay | Admitting: Family Medicine

## 2020-04-24 MED ORDER — DICLOFENAC SODIUM 3 % EX GEL: CUTANEOUS | 3 refills | Status: AC

## 2020-04-24 NOTE — Telephone Encounter (Signed)
Correction sent

## 2020-04-24 NOTE — Telephone Encounter (Signed)
Diclofenac Sodium 3 % GEL [[id number]]    Per pharmacy medication comes in 100mg  tube only.   Please Advise

## 2020-04-26 ENCOUNTER — Other Ambulatory Visit: Payer: Self-pay | Admitting: Cardiology

## 2020-04-26 ENCOUNTER — Other Ambulatory Visit: Payer: Self-pay | Admitting: Family Medicine

## 2020-04-26 DIAGNOSIS — I10 Essential (primary) hypertension: Secondary | ICD-10-CM

## 2020-04-26 DIAGNOSIS — E1169 Type 2 diabetes mellitus with other specified complication: Secondary | ICD-10-CM

## 2020-04-26 DIAGNOSIS — E1165 Type 2 diabetes mellitus with hyperglycemia: Secondary | ICD-10-CM

## 2020-04-27 NOTE — Telephone Encounter (Signed)
Lasix DC'ed by PCP on 04/23/20

## 2020-04-29 NOTE — Progress Notes (Signed)
HPI: FU CAD with prior PCI of his LAD in May 2003. His last cardiac catheterization was performed on December 16, 2008 by Dr. Olevia Perches for exertional chest pain. He was found to have a 30-40% LAD just distal to the stent. There was no other obstructive disease noted. The ejection fraction is 50%. He has been treated medically. Abdominal ultrasound in Oct 2011 showed no aneurysm. Nuclear study inMarch 2020 showed ejection fraction 56%. Patient had significant ST changes but perfusion was normal.Echocardiogram May 2020 showed normal LV function, mild left atrial enlargement. Cardiac MRI October 2020 showed basal anteroseptal enhancement which could be seen with prior myocarditis but also could be seen with sarcoid.  Pattern not consistent with amyloid.  Ejection fraction 61% and normal. Patient has had worsening problems with bilateral lower extremity edema recently.  Work-up has demonstrated significant anemia.  Since I last saw him,he denies dyspnea, chest pain, palpitations or syncope.  His pedal edema is much improved.  Current Outpatient Medications  Medication Sig Dispense Refill  . aspirin 81 MG tablet Take 81 mg by mouth daily.      Marland Kitchen atorvastatin (LIPITOR) 40 MG tablet TAKE ONE TABLET BY MOUTH DAILY AT 6PM 30 tablet 4  . azelastine (ASTELIN) 0.1 % nasal spray Place 1 spray into both nostrils 2 (two) times daily. Use in each nostril as directed (Patient taking differently: Place 1 spray into both nostrils daily. Use in each nostril as directed) 30 mL 12  . Biotin 1000 MCG tablet Take 1,000 mcg by mouth daily.     . celecoxib (CELEBREX) 200 MG capsule Take 1 capsule (200 mg total) by mouth daily. 90 capsule 1  . cetirizine (ZYRTEC) 10 MG tablet Take 10 mg by mouth as needed for allergies.    . Cinnamon 500 MG capsule Take 1,000 mg by mouth 2 (two) times daily.     . Coenzyme Q10 (COQ-10) 100 MG capsule Take 1 capsule (100 mg total) by mouth daily.    . Cyanocobalamin (VITAMIN B-12) 1000  MCG SUBL     . Diclofenac Sodium 3 % GEL Qid prn 100 g 3  . ezetimibe (ZETIA) 10 MG tablet Take 1 tablet (10 mg total) by mouth daily. 90 tablet 1  . Ginger, Zingiber officinalis, (GINGER ROOT) 550 MG CAPS Take 1 capsule by mouth 2 (two) times daily.     Marland Kitchen levothyroxine (SYNTHROID) 112 MCG tablet Take 1 tablet (112 mcg total) by mouth at bedtime. 90 tablet 1  . losartan (COZAAR) 50 MG tablet TAKE ONE TABLET BY MOUTH DAILY 90 tablet 0  . metFORMIN (GLUCOPHAGE) 500 MG tablet TAKE ONE TABLET BY MOUTH TWICE A DAY 180 tablet 1  . Multiple Vitamins-Minerals (THERAGRAN-M ADVANCED 50 PLUS PO) Take by mouth.    . Nerve Stimulator (CEFALY KIT) DEVI by Does not apply route.    Marland Kitchen OVER THE COUNTER MEDICATION Migra-eeze: Take 1 gel capsule two times daily.    . pantoprazole (PROTONIX) 40 MG tablet TAKE ONE TABLET BY MOUTH DAILY 90 tablet 0  . propranolol ER (INDERAL LA) 160 MG SR capsule One tablet po qd 90 capsule 3  . tamsulosin (FLOMAX) 0.4 MG CAPS capsule Take 1 capsule (0.4 mg total) by mouth daily. Reported on 09/23/2015 30 capsule   . topiramate (TOPAMAX) 50 MG tablet Take 1 tablet (50 mg total) by mouth at bedtime. 30 tablet 5  . Turmeric 500 MG CAPS Take 2 capsules by mouth 2 (two) times a day.    Marland Kitchen  VITAMIN D PO Take 6,000 Units by mouth daily.      No current facility-administered medications for this visit.     Past Medical History:  Diagnosis Date  . Allergy   . Arthritis   . CAD (coronary artery disease)    s/p PTCI in 2003 and 2004 to the LAD.   Marland Kitchen Cataract   . Chronic tubotympanic suppurative otitis media, bilateral   . Depression   . Diabetes mellitus   . Eustachian tube dysfunction   . History of placement of ear tubes   . Hyperlipidemia   . Hypertension   . Hypothyroid   . Internal hemorrhoids   . Migraine headache   . Skin cancer, basal cell   . Thrombocytopenia ([place])   . Thrombocytopenia, unspecified ([place]) 05/08/2013  . TIA (transient ischemic attack) 2006   per  patient's report.  He was never officially given this diagnosis    Past Surgical History:  Procedure Laterality Date  . COLONOSCOPY  2011  . coronary artery disease status post placement     of drug-eluting stent in the LAD in 2003,eEF 65% then  . esophogeal dilation    . fatty tissue removed     from neck 2003  . HERNIA REPAIR  2007  . mastoidectomy with tympanoplasty    . POLYPECTOMY  2011   +TA  . right toe bone spur surgery    . THYROIDECTOMY, PARTIAL  07/2002   right thyroid  . TONSILLECTOMY    . UPPER GI ENDOSCOPY      Social History   Socioeconomic History  . Marital status: Married    Spouse name: Charleen  . Number of children: 0  . Years of education: Not on file  . Highest education level: Associate degree: occupational, Hotel manager, or vocational program  Occupational History  . Occupation: retired    Fish farm manager: RETIRED    Comment: accountant  Tobacco Use  . Smoking status: Former Smoker    Years: 5.00    Types: Cigarettes    Quit date: 10/10/1965    Years since quitting: 54.6  . Smokeless tobacco: Never Used  Vaping Use  . Vaping Use: Never used  Substance and Sexual Activity  . Alcohol use: No    Alcohol/week: 0.0 standard drinks    Comment: wine rarely  . Drug use: No  . Sexual activity: Yes    Partners: Female  Other Topics Concern  . Not on file  Social History Narrative   Pt lives with spouse at private home in 2 story home- he has no children- right handed- he drinks coffee daily, tea sometimes, soda not all the time. Exercise-- treadmill, stationary bike   Social Determinants of Health   Financial Resource Strain:   . Difficulty of Paying Living Expenses:   Food Insecurity:   . Worried About Charity fundraiser in the Last Year:   . Arboriculturist in the Last Year:   Transportation Needs:   . Film/video editor (Medical):   Marland Kitchen Lack of Transportation (Non-Medical):   Physical Activity:   . Days of Exercise per Week:   . Minutes of  Exercise per Session:   Stress:   . Feeling of Stress :   Social Connections:   . Frequency of Communication with Friends and Family:   . Frequency of Social Gatherings with Friends and Family:   . Attends Religious Services:   . Active Member of Clubs or Organizations:   . Attends Club or  Organization Meetings:   Marland Kitchen Marital Status:   Intimate Partner Violence:   . Fear of Current or Ex-Partner:   . Emotionally Abused:   Marland Kitchen Physically Abused:   . Sexually Abused:     Family History  Problem Relation Age of Onset  . Mental illness Mother        alzheimers  . Heart disease Mother   . Heart disease Father        triple by pass  . Hypertension Brother        Prostate Cancer  . Prostate cancer Brother   . Hypertension Brother   . Hypertension Brother   . COPD Brother   . Colon cancer Neg Hx   . Colon polyps Neg Hx   . Rectal cancer Neg Hx   . Stomach cancer Neg Hx   . Esophageal cancer Neg Hx     ROS: no fevers or chills, productive cough, hemoptysis, dysphasia, odynophagia, melena, hematochezia, dysuria, hematuria, rash, seizure activity, orthopnea, PND, pedal edema, claudication. Remaining systems are negative.  Physical Exam: Well-developed well-nourished in no acute distress.  Skin is warm and dry.  HEENT is normal.  Neck is supple.  Chest is clear to auscultation with normal expansion.  Cardiovascular exam is regular rate and rhythm.  Abdominal exam nontender or distended. No masses palpated. Extremities show no edema. neuro grossly intact  ECG-sinus rhythm at a rate of 60, no ST changes.  Personally reviewed  A/P  1 lower ext edema-improved following treatment of anemia; likely related to high output; previous cardiac eval negative.  2 CAD-no CP; continue ASA and statin.  3 hypertension-BP borderline.  I have asked him to track this at home and we will increase losartan if needed.  4 hyperlipidemia-continue statin.  5 anemia-per primary care and  GI.  Kirk Ruths, MD

## 2020-05-05 ENCOUNTER — Other Ambulatory Visit: Payer: Self-pay

## 2020-05-05 ENCOUNTER — Encounter: Payer: Self-pay | Admitting: Cardiology

## 2020-05-05 ENCOUNTER — Ambulatory Visit (INDEPENDENT_AMBULATORY_CARE_PROVIDER_SITE_OTHER): Payer: Medicare Other | Admitting: Cardiology

## 2020-05-05 VITALS — BP 138/70 | HR 60 | Ht 69.0 in | Wt 158.0 lb

## 2020-05-05 DIAGNOSIS — I10 Essential (primary) hypertension: Secondary | ICD-10-CM | POA: Diagnosis not present

## 2020-05-05 DIAGNOSIS — I2510 Atherosclerotic heart disease of native coronary artery without angina pectoris: Secondary | ICD-10-CM

## 2020-05-05 DIAGNOSIS — E785 Hyperlipidemia, unspecified: Secondary | ICD-10-CM | POA: Diagnosis not present

## 2020-05-05 DIAGNOSIS — Z9861 Coronary angioplasty status: Secondary | ICD-10-CM

## 2020-05-05 NOTE — Patient Instructions (Signed)

## 2020-05-07 ENCOUNTER — Other Ambulatory Visit: Payer: Self-pay | Admitting: Cardiology

## 2020-05-18 ENCOUNTER — Other Ambulatory Visit: Payer: Self-pay | Admitting: Neurology

## 2020-06-10 DIAGNOSIS — L989 Disorder of the skin and subcutaneous tissue, unspecified: Secondary | ICD-10-CM | POA: Diagnosis not present

## 2020-06-10 DIAGNOSIS — Z85828 Personal history of other malignant neoplasm of skin: Secondary | ICD-10-CM | POA: Diagnosis not present

## 2020-06-10 DIAGNOSIS — L570 Actinic keratosis: Secondary | ICD-10-CM | POA: Diagnosis not present

## 2020-06-10 DIAGNOSIS — D229 Melanocytic nevi, unspecified: Secondary | ICD-10-CM | POA: Diagnosis not present

## 2020-06-10 DIAGNOSIS — L821 Other seborrheic keratosis: Secondary | ICD-10-CM | POA: Diagnosis not present

## 2020-06-10 DIAGNOSIS — R238 Other skin changes: Secondary | ICD-10-CM | POA: Diagnosis not present

## 2020-06-10 DIAGNOSIS — L814 Other melanin hyperpigmentation: Secondary | ICD-10-CM | POA: Diagnosis not present

## 2020-06-10 DIAGNOSIS — D0339 Melanoma in situ of other parts of face: Secondary | ICD-10-CM | POA: Diagnosis not present

## 2020-06-10 DIAGNOSIS — L905 Scar conditions and fibrosis of skin: Secondary | ICD-10-CM | POA: Diagnosis not present

## 2020-06-17 ENCOUNTER — Other Ambulatory Visit: Payer: Self-pay | Admitting: Family Medicine

## 2020-06-17 ENCOUNTER — Other Ambulatory Visit: Payer: Self-pay | Admitting: Cardiology

## 2020-06-17 DIAGNOSIS — IMO0002 Reserved for concepts with insufficient information to code with codable children: Secondary | ICD-10-CM

## 2020-07-14 ENCOUNTER — Other Ambulatory Visit: Payer: Self-pay | Admitting: Cardiology

## 2020-07-14 ENCOUNTER — Other Ambulatory Visit: Payer: Self-pay | Admitting: Gastroenterology

## 2020-07-17 ENCOUNTER — Other Ambulatory Visit: Payer: Self-pay | Admitting: Cardiology

## 2020-07-17 DIAGNOSIS — Z23 Encounter for immunization: Secondary | ICD-10-CM | POA: Diagnosis not present

## 2020-07-18 ENCOUNTER — Encounter: Payer: Self-pay | Admitting: Family Medicine

## 2020-07-20 NOTE — Telephone Encounter (Signed)
Noted  

## 2020-07-21 ENCOUNTER — Encounter: Payer: Self-pay | Admitting: Family Medicine

## 2020-07-21 DIAGNOSIS — E785 Hyperlipidemia, unspecified: Secondary | ICD-10-CM

## 2020-07-21 DIAGNOSIS — D696 Thrombocytopenia, unspecified: Secondary | ICD-10-CM

## 2020-07-21 DIAGNOSIS — E1165 Type 2 diabetes mellitus with hyperglycemia: Secondary | ICD-10-CM

## 2020-07-27 DIAGNOSIS — D0339 Melanoma in situ of other parts of face: Secondary | ICD-10-CM | POA: Diagnosis not present

## 2020-07-27 DIAGNOSIS — L989 Disorder of the skin and subcutaneous tissue, unspecified: Secondary | ICD-10-CM | POA: Diagnosis not present

## 2020-07-27 NOTE — Addendum Note (Signed)
Addended by: Hinton Dyer on: 07/27/2020 09:20 AM   Modules accepted: Orders

## 2020-07-28 ENCOUNTER — Other Ambulatory Visit (INDEPENDENT_AMBULATORY_CARE_PROVIDER_SITE_OTHER): Payer: Medicare Other

## 2020-07-28 DIAGNOSIS — E1165 Type 2 diabetes mellitus with hyperglycemia: Secondary | ICD-10-CM | POA: Diagnosis not present

## 2020-07-28 DIAGNOSIS — E1169 Type 2 diabetes mellitus with other specified complication: Secondary | ICD-10-CM | POA: Diagnosis not present

## 2020-07-28 DIAGNOSIS — D696 Thrombocytopenia, unspecified: Secondary | ICD-10-CM | POA: Diagnosis not present

## 2020-07-28 DIAGNOSIS — E785 Hyperlipidemia, unspecified: Secondary | ICD-10-CM | POA: Diagnosis not present

## 2020-07-28 LAB — CBC WITH DIFFERENTIAL/PLATELET
Basophils Absolute: 0 10*3/uL (ref 0.0–0.1)
Basophils Relative: 0.8 % (ref 0.0–3.0)
Eosinophils Absolute: 0.5 10*3/uL (ref 0.0–0.7)
Eosinophils Relative: 9.3 % — ABNORMAL HIGH (ref 0.0–5.0)
HCT: 40.1 % (ref 39.0–52.0)
Hemoglobin: 13.2 g/dL (ref 13.0–17.0)
Lymphocytes Relative: 16.2 % (ref 12.0–46.0)
Lymphs Abs: 0.9 10*3/uL (ref 0.7–4.0)
MCHC: 32.8 g/dL (ref 30.0–36.0)
MCV: 83.9 fl (ref 78.0–100.0)
Monocytes Absolute: 0.5 10*3/uL (ref 0.1–1.0)
Monocytes Relative: 9.7 % (ref 3.0–12.0)
Neutro Abs: 3.6 10*3/uL (ref 1.4–7.7)
Neutrophils Relative %: 64 % (ref 43.0–77.0)
Platelets: 163 10*3/uL (ref 150.0–400.0)
RBC: 4.78 Mil/uL (ref 4.22–5.81)
RDW: 14.5 % (ref 11.5–15.5)
WBC: 5.6 10*3/uL (ref 4.0–10.5)

## 2020-07-28 LAB — COMPREHENSIVE METABOLIC PANEL
ALT: 19 U/L (ref 0–53)
AST: 19 U/L (ref 0–37)
Albumin: 3.4 g/dL — ABNORMAL LOW (ref 3.5–5.2)
Alkaline Phosphatase: 58 U/L (ref 39–117)
BUN: 22 mg/dL (ref 6–23)
CO2: 26 mEq/L (ref 19–32)
Calcium: 8.1 mg/dL — ABNORMAL LOW (ref 8.4–10.5)
Chloride: 110 mEq/L (ref 96–112)
Creatinine, Ser: 1.17 mg/dL (ref 0.40–1.50)
GFR: 59.98 mL/min — ABNORMAL LOW (ref >60.00)
Glucose, Bld: 107 mg/dL — ABNORMAL HIGH (ref 70–99)
Potassium: 3.9 mEq/L (ref 3.5–5.1)
Sodium: 140 mEq/L (ref 135–145)
Total Bilirubin: 0.5 mg/dL (ref 0.2–1.2)
Total Protein: 5.7 g/dL — ABNORMAL LOW (ref 6.0–8.3)

## 2020-07-28 LAB — LIPID PANEL
Cholesterol: 94 mg/dL (ref 0–200)
HDL: 45.9 mg/dL (ref >39.00)
LDL Cholesterol: 39 mg/dL (ref 0–99)
NonHDL: 47.6
Total CHOL/HDL Ratio: 2
Triglycerides: 45 mg/dL (ref 0.0–149.0)
VLDL: 9 mg/dL (ref 0.0–40.0)

## 2020-07-28 LAB — HEMOGLOBIN A1C: Hgb A1c MFr Bld: 6.1 % (ref 4.6–6.5)

## 2020-07-29 DIAGNOSIS — D0339 Melanoma in situ of other parts of face: Secondary | ICD-10-CM | POA: Diagnosis not present

## 2020-08-06 DIAGNOSIS — Z23 Encounter for immunization: Secondary | ICD-10-CM | POA: Diagnosis not present

## 2020-08-08 ENCOUNTER — Encounter: Payer: Self-pay | Admitting: Family Medicine

## 2020-08-16 ENCOUNTER — Other Ambulatory Visit: Payer: Self-pay | Admitting: Family Medicine

## 2020-08-16 ENCOUNTER — Other Ambulatory Visit: Payer: Self-pay | Admitting: Cardiology

## 2020-08-16 DIAGNOSIS — IMO0002 Reserved for concepts with insufficient information to code with codable children: Secondary | ICD-10-CM

## 2020-08-16 DIAGNOSIS — E1151 Type 2 diabetes mellitus with diabetic peripheral angiopathy without gangrene: Secondary | ICD-10-CM

## 2020-09-10 ENCOUNTER — Other Ambulatory Visit: Payer: Self-pay | Admitting: Family Medicine

## 2020-09-10 DIAGNOSIS — E785 Hyperlipidemia, unspecified: Secondary | ICD-10-CM

## 2020-09-15 DIAGNOSIS — R972 Elevated prostate specific antigen [PSA]: Secondary | ICD-10-CM | POA: Diagnosis not present

## 2020-09-22 DIAGNOSIS — R972 Elevated prostate specific antigen [PSA]: Secondary | ICD-10-CM | POA: Diagnosis not present

## 2020-09-22 DIAGNOSIS — N401 Enlarged prostate with lower urinary tract symptoms: Secondary | ICD-10-CM | POA: Diagnosis not present

## 2020-09-22 DIAGNOSIS — R3912 Poor urinary stream: Secondary | ICD-10-CM | POA: Diagnosis not present

## 2020-09-30 DIAGNOSIS — H35372 Puckering of macula, left eye: Secondary | ICD-10-CM | POA: Diagnosis not present

## 2020-09-30 DIAGNOSIS — H2513 Age-related nuclear cataract, bilateral: Secondary | ICD-10-CM | POA: Diagnosis not present

## 2020-09-30 DIAGNOSIS — H52203 Unspecified astigmatism, bilateral: Secondary | ICD-10-CM | POA: Diagnosis not present

## 2020-09-30 DIAGNOSIS — E119 Type 2 diabetes mellitus without complications: Secondary | ICD-10-CM | POA: Diagnosis not present

## 2020-09-30 DIAGNOSIS — H5010 Unspecified exotropia: Secondary | ICD-10-CM | POA: Diagnosis not present

## 2020-09-30 LAB — HM DIABETES EYE EXAM

## 2020-10-06 ENCOUNTER — Other Ambulatory Visit: Payer: Self-pay | Admitting: Gastroenterology

## 2020-10-12 NOTE — Progress Notes (Signed)
NEUROLOGY FOLLOW UP OFFICE NOTE  [name] [id number]   Subjective:  [name] a 75 year old right-handed male with osteoarthritis, hypertension, type 2 diabetes mellitus, hyperlipidemia and s/p mastoidectomy and tympanic membrane repair whofollows up for migraines and essential tremor.  UPDATE: 20 min daily everyday.   I Migraine: He is doing well.   Migraines are more controlled.  He may have 2 months of no migraines but then may have a month with 3 migraines.   Frequency of abortive medication:infrequent Rescue protocol: Ginger tea with Tylenol, Cefaly Current NSAIDS:piroxiicam daily (for arthritis) Current analgesics:no Current triptans:no Current ergotamine:no Current anti-emetic:no Current muscle relaxants:no Current anti-anxiolytic:no Current sleep aide:no Current Antihypertensive medications:Losartan-HCTZ, propranolol ER 196m Current Antidepressant medications:no Current Anticonvulsant medications:topiramate 562mat bedtime Current anti-CGRP:no Current Vitamins/Herbal/Supplements:Ginger tea, biotin, multivitamin, fish oil, coenzyme Q 10, cinnamon, gingerroot, turmeric 500 mg, magnesium 250 mg, riboflavin 400 mg, butterbur 150 mg Current Antihistamines/Decongestants:Flonase Other therapy:Cephaly.Treat acutely with ginger tea and rest.  Caffeine:2 to 3 cups of coffee daily Alcohol:No Smoker:No Diet:Hydrates. No soda. Exercise:Yes Depression:Stable; Anxiety:Stable Other pain:no Sleep hygiene:He wakes up every hour to urinate  II Essential Tremor: He is taking propranolol ER 160 mg daily. Tremors are stable.  He explains for the last 1 to 1 1/2 years, he has had some double vision.  MRI of brain in March was unremarkable.  He recently saw the eye doctor who has prescribed him glasses with a prism.  They are currently being made.    HISTORY: I Migraine: Onset: In his 2060sr 30s. They would occur  once or twice a year. In September, they started to occur once or twice a week and then almost daily. Over the past couple of weeks (following tympanostomy tube placement), they have occurred about once a week. Location: Top of head Quality: explode Initial Intensity: 10/10 Aura: Flashing lights/colors in vision Prodrome: no Postdrome: Hangover effect for up to 5 days Associated symptoms: Nausea, photophobia, phonophobia. Denies unilateral numbness and weakness. Initial Duration: Several hours Initial Frequency: In September, they started to occur once or twice a week and then almost daily. Over the past couple of weeks (following tympanostomy tube placement), they have occurred about once a week. Triggers: Emotional stress, sound, NTG, light, wine, eye strain Relieving factors: Sleep Activity: Needs to lay down  Past NSAIDS: Celebrex Past analgesics: no Past abortive triptans: Sumatriptan (stopped due to CAD). Past muscle relaxants: no Past anti-emetic: no Past antihypertensive medications: none Past antidepressant medications: Prozac, amitriptyline Past anticonvulsant medications: no Past vitamins/Herbal/Supplements: no Other past therapies: Biofeedback, relaxation therapy  MRI of brain without contrast from 11/2/17showedmild chronic small vessel ischemic changes but no acute stroke, bleed or mass lesion. However, it did reveal mild mastoiditis. He saw ENT and had tympanostomy tube placement on 09/22/16. Due to worsening headaches, he had an MRI brain with and without contrast was performed on 01/02/2020 which showed no acute intracranial abnormality.  IIEssential Tremor: For over 10 years, he has had a tremor, particularly in his right hand. It has become more noticeable. 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 There is no known family history.    PAST MEDICAL HISTORY: Past Medical History:  Diagnosis Date  . Allergy    . Arthritis   . CAD (coronary artery disease)    s/p PTCI in 2003 and 2004 to the LAD.   . Marland Kitchenataract   . Chronic tubotympanic suppurative otitis media, bilateral   . Depression   .  Diabetes mellitus   . Eustachian tube dysfunction   . History of placement of ear tubes   . Hyperlipidemia   . Hypertension   . Hypothyroid   . Internal hemorrhoids   . Migraine headache   . Skin cancer, basal cell   . Thrombocytopenia ([place])   . Thrombocytopenia, unspecified ([place]) 05/08/2013  . TIA (transient ischemic attack) 2006   per patient's report.  He was never officially given this diagnosis    MEDICATIONS: Current Outpatient Medications on File Prior to Visit  Medication Sig Dispense Refill  . aspirin 81 MG tablet Take 81 mg by mouth daily.      Marland Kitchen atorvastatin (LIPITOR) 40 MG tablet TAKE ONE TABLET BY MOUTH DAILY AT 6PM 30 tablet 4  . azelastine (ASTELIN) 0.1 % nasal spray Place 1 spray into both nostrils 2 (two) times daily. Use in each nostril as directed (Patient taking differently: Place 1 spray into both nostrils daily. Use in each nostril as directed) 30 mL 12  . Biotin 1000 MCG tablet Take 1,000 mcg by mouth daily.     . celecoxib (CELEBREX) 200 MG capsule Take 1 capsule (200 mg total) by mouth daily. 90 capsule 1  . cetirizine (ZYRTEC) 10 MG tablet Take 10 mg by mouth as needed for allergies.    . Cinnamon 500 MG capsule Take 1,000 mg by mouth 2 (two) times daily.     . Coenzyme Q10 (COQ-10) 100 MG capsule Take 1 capsule (100 mg total) by mouth daily.    . Cyanocobalamin (VITAMIN B-12) 1000 MCG SUBL     . Diclofenac Sodium 3 % GEL Qid prn 100 g 3  . ezetimibe (ZETIA) 10 MG tablet TAKE ONE TABLET BY MOUTH DAILY 90 tablet 1  . furosemide (LASIX) 20 MG tablet TAKE ONE TABLET BY MOUTH DAILY 90 tablet 3  . Ginger, Zingiber officinalis, (GINGER ROOT) 550 MG CAPS Take 1 capsule by mouth 2 (two) times daily.     Marland Kitchen levothyroxine (SYNTHROID) 112 MCG tablet Take 1 tablet (112 mcg total) by mouth  at bedtime. 90 tablet 1  . losartan (COZAAR) 50 MG tablet TAKE ONE TABLET BY MOUTH DAILY 90 tablet 3  . metFORMIN (GLUCOPHAGE) 500 MG tablet TAKE ONE TABLET BY MOUTH TWICE A DAY 60 tablet 2  . Multiple Vitamins-Minerals (THERAGRAN-M ADVANCED 50 PLUS PO) Take by mouth.    . Nerve Stimulator (CEFALY KIT) DEVI by Does not apply route.    Marland Kitchen OVER THE COUNTER MEDICATION Migra-eeze: Take 1 gel capsule two times daily.    . pantoprazole (PROTONIX) 40 MG tablet TAKE ONE TABLET BY MOUTH DAILY 90 tablet 0  . propranolol ER (INDERAL LA) 160 MG SR capsule One tablet po qd 90 capsule 3  . tamsulosin (FLOMAX) 0.4 MG CAPS capsule Take 1 capsule (0.4 mg total) by mouth daily. Reported on 09/23/2015 30 capsule   . topiramate (TOPAMAX) 50 MG tablet TAKE ONE TABLET BY MOUTH AT BEDTIME 30 tablet 5  . Turmeric 500 MG CAPS Take 2 capsules by mouth 2 (two) times a day.    Marland Kitchen VITAMIN D PO Take 6,000 Units by mouth daily.      No current facility-administered medications on file prior to visit.    ALLERGIES: Allergies  Allergen Reactions  . Sulfa Antibiotics Other (See Comments)  . Sulfamethoxazole-Trimethoprim Nausea And Vomiting  . Sulfasalazine Other (See Comments)  . Codeine   . Crestor [Rosuvastatin Calcium]   . Hydrocodone-Acetaminophen     vicodin   .  Other     Apples and tomatoes...... Nausea and migraines.    . Rosuvastatin     REACTION: REALLY BAD JOINT/MUSCLE PAIN    FAMILY HISTORY: Family History  Problem Relation Age of Onset  . Mental illness Mother        alzheimers  . Heart disease Mother   . Heart disease Father        triple by pass  . Hypertension Brother        Prostate Cancer  . Prostate cancer Brother   . Hypertension Brother   . Hypertension Brother   . COPD Brother   . Colon cancer Neg Hx   . Colon polyps Neg Hx   . Rectal cancer Neg Hx   . Stomach cancer Neg Hx   . Esophageal cancer Neg Hx     SOCIAL HISTORY: Social History   Socioeconomic History  . Marital  status: Married    Spouse name: Charleen  . Number of children: 0  . Years of education: Not on file  . Highest education level: Associate degree: occupational, Hotel manager, or vocational program  Occupational History  . Occupation: retired    Fish farm manager: RETIRED    Comment: accountant  Tobacco Use  . Smoking status: Former Smoker    Years: 5.00    Types: Cigarettes    Quit date: 10/10/1965    Years since quitting: 55.0  . Smokeless tobacco: Never Used  Vaping Use  . Vaping Use: Never used  Substance and Sexual Activity  . Alcohol use: No    Alcohol/week: 0.0 standard drinks    Comment: wine rarely  . Drug use: No  . Sexual activity: Yes    Partners: Female  Other Topics Concern  . Not on file  Social History Narrative   Pt lives with spouse at private home in 2 story home- he has no children- right handed- he drinks coffee daily, tea sometimes, soda not all the time. Exercise-- treadmill, stationary bike   Social Determinants of Health   Financial Resource Strain: Not on file  Food Insecurity: Not on file  Transportation Needs: Not on file  Physical Activity: Not on file  Stress: Not on file  Social Connections: Not on file  Intimate Partner Violence: Not on file     Objective:  Blood pressure (!) 164/83, pulse 78, resp. rate 20, height _0  (1.753 m), weight 165 lb (74.8 kg), SpO2 99 %. General: No acute distress.  Patient appears well-groomed.   Head:  Normocephalic/atraumatic Eyes:  Fundi examined but not visualized Neck: supple, no paraspinal tenderness, full range of motion Heart:  Regular rate and rhythm Lungs:  Clear to auscultation bilaterally Back: No paraspinal tenderness Neurological Exam: alert and oriented to person, place, and time. Attention span and concentration intact, recent and remote memory intact, fund of knowledge intact.  Speech fluent and not dysarthric, language intact.  Slight disconjugate eye movements with tracking.  Otherwise, CN II-XII  intact. Bulk and tone normal, muscle strength 5/5 throughout.  Sensation to light touch, temperature and vibration intact.  Deep tendon reflexes 2+ throughout, toes downgoing.  Finger to nose and heel to shin testing intact.  Gait normal, Romberg negative.   Assessment/Plan:   1.  Migraine without aura, without status migrainosus, not intractable 2.  Essential tremor  1.  Migraine prevention:  Propranolol ER 159m daily, topiramate 561mat bedtime, Cefaly 2.  Essential tremor management:  Propranolol Er 16062maily, topiramate 58m60m bedtime 3.  Migraine rescue:  Cefaly 4.  Limit use of pain relievers to no more than 2 days out of week to prevent risk of rebound or medication-overuse headache. 5.  Keep headache diary 6.  Follow up in 9 months.  Metta Clines, DO  CC:  Roma Schanz, DO

## 2020-10-13 ENCOUNTER — Other Ambulatory Visit: Payer: Self-pay

## 2020-10-13 ENCOUNTER — Ambulatory Visit (INDEPENDENT_AMBULATORY_CARE_PROVIDER_SITE_OTHER): Payer: Medicare Other | Admitting: Neurology

## 2020-10-13 ENCOUNTER — Encounter: Payer: Self-pay | Admitting: Neurology

## 2020-10-13 VITALS — BP 164/83 | HR 78 | Resp 20 | Ht 69.0 in | Wt 165.0 lb

## 2020-10-13 DIAGNOSIS — G43109 Migraine with aura, not intractable, without status migrainosus: Secondary | ICD-10-CM

## 2020-10-13 DIAGNOSIS — G250 Essential tremor: Secondary | ICD-10-CM | POA: Diagnosis not present

## 2020-10-13 NOTE — Patient Instructions (Addendum)
1.  Continue topiramate 50mg  at bedtime and the Cefaly 2.  Follow up in 9 months

## 2020-10-22 ENCOUNTER — Other Ambulatory Visit: Payer: Self-pay | Admitting: General Surgery

## 2020-10-22 DIAGNOSIS — L905 Scar conditions and fibrosis of skin: Secondary | ICD-10-CM | POA: Diagnosis not present

## 2020-10-22 DIAGNOSIS — Z85820 Personal history of malignant melanoma of skin: Secondary | ICD-10-CM | POA: Diagnosis not present

## 2020-10-22 DIAGNOSIS — L821 Other seborrheic keratosis: Secondary | ICD-10-CM | POA: Diagnosis not present

## 2020-10-22 DIAGNOSIS — Z85828 Personal history of other malignant neoplasm of skin: Secondary | ICD-10-CM | POA: Diagnosis not present

## 2020-10-22 DIAGNOSIS — L570 Actinic keratosis: Secondary | ICD-10-CM | POA: Diagnosis not present

## 2020-10-22 DIAGNOSIS — D485 Neoplasm of uncertain behavior of skin: Secondary | ICD-10-CM | POA: Diagnosis not present

## 2020-10-22 DIAGNOSIS — D034 Melanoma in situ of scalp and neck: Secondary | ICD-10-CM | POA: Diagnosis not present

## 2020-10-22 DIAGNOSIS — L309 Dermatitis, unspecified: Secondary | ICD-10-CM | POA: Diagnosis not present

## 2020-10-22 DIAGNOSIS — D229 Melanocytic nevi, unspecified: Secondary | ICD-10-CM | POA: Diagnosis not present

## 2020-10-22 DIAGNOSIS — L819 Disorder of pigmentation, unspecified: Secondary | ICD-10-CM | POA: Diagnosis not present

## 2020-10-22 DIAGNOSIS — L814 Other melanin hyperpigmentation: Secondary | ICD-10-CM | POA: Diagnosis not present

## 2020-10-26 ENCOUNTER — Encounter: Payer: Self-pay | Admitting: Family Medicine

## 2020-10-26 DIAGNOSIS — E039 Hypothyroidism, unspecified: Secondary | ICD-10-CM

## 2020-10-27 MED ORDER — LEVOTHYROXINE SODIUM 112 MCG PO TABS: 112.0000 ug | ORAL_TABLET | Freq: Every day | ORAL | 1 refills | Status: DC

## 2020-10-27 NOTE — Telephone Encounter (Signed)
Rx printed and placed in mail to Pt.

## 2020-10-29 ENCOUNTER — Ambulatory Visit: Payer: Medicare Other | Admitting: *Deleted

## 2020-11-04 NOTE — Progress Notes (Signed)
Subjective:   [name] is a 75 y.o. male who presents for Medicare Annual/Subsequent preventive examination.  Review of Systems     Cardiac Risk Factors include: advanced age (>8mn, >>81women);male gender;diabetes mellitus;dyslipidemia;hypertension     Objective:    Today's Vitals   11/05/20 1228 11/05/20 1300  BP: (!) 178/96 (!) 160/84  Pulse: 64   Resp: 16   Temp: 97.8 F (36.6 C)   TempSrc: Oral   SpO2: 99%   Weight: 163 lb (73.9 kg)   Height: '5\' 9"'  (1.753 m)    Body mass index is 24.07 kg/m.  Advanced Directives 11/05/2020 10/13/2020 04/09/2020 08/08/2019 01/29/2019 05/17/2017 10/06/2016  Does Patient Have a Medical Advance Directive? Yes Yes Yes No;Yes Yes Yes Yes  Type of AParamedicof APine HollowLiving will - [place] will Living will;Healthcare Power of [place] will [place] will -  Does patient want to make changes to medical advance directive? - - - - No - Patient declined - No - Patient declined  Copy of [place] Chart? Yes - validated most recent copy scanned in chart (See row information) - - - No - copy requested - -    Current Medications (verified) Outpatient Encounter Medications as of 11/05/2020  Medication Sig   aspirin 81 MG tablet Take 81 mg by mouth daily.   atorvastatin (LIPITOR) 40 MG tablet TAKE ONE TABLET BY MOUTH DAILY AT 6PM   Biotin 1000 MCG tablet Take 1,000 mcg by mouth daily.   celecoxib (CELEBREX) 200 MG capsule Take 1 capsule (200 mg total) by mouth daily.   cetirizine (ZYRTEC) 10 MG tablet Take 10 mg by mouth as needed for allergies.   Cinnamon 500 MG capsule Take 1,000 mg by mouth 2 (two) times daily.   Coenzyme Q10 (COQ-10) 100 MG capsule Take 1 capsule (100 mg total) by mouth daily.   Cyanocobalamin (VITAMIN B-12) 1000 MCG SUBL    Diclofenac Sodium 3 % GEL Qid prn   ezetimibe (ZETIA) 10 MG  tablet TAKE ONE TABLET BY MOUTH DAILY   Ginger, Zingiber officinalis, (GINGER ROOT) 550 MG CAPS Take 1 capsule by mouth 2 (two) times daily.   levothyroxine (SYNTHROID) 112 MCG tablet Take 1 tablet (112 mcg total) by mouth daily before breakfast.   losartan (COZAAR) 50 MG tablet TAKE ONE TABLET BY MOUTH DAILY   metFORMIN (GLUCOPHAGE) 500 MG tablet TAKE ONE TABLET BY MOUTH TWICE A DAY   Multiple Vitamins-Minerals (THERAGRAN-M ADVANCED 50 PLUS PO) Take by mouth.   Nerve Stimulator (CEFALY KIT) DEVI by Does not apply route.   OVER THE COUNTER MEDICATION Migra-eeze: Take 1 gel capsule two times daily.   pantoprazole (PROTONIX) 40 MG tablet TAKE ONE TABLET BY MOUTH DAILY   propranolol ER (INDERAL LA) 160 MG SR capsule One tablet po qd   tamsulosin (FLOMAX) 0.4 MG CAPS capsule Take 0.4 mg by mouth in the morning and at bedtime. Reported on 09/23/2015   topiramate (TOPAMAX) 50 MG tablet TAKE ONE TABLET BY MOUTH AT BEDTIME   Turmeric 500 MG CAPS Take 2 capsules by mouth 2 (two) times a day.   VITAMIN D PO Take 6,000 Units by mouth daily.    azelastine (ASTELIN) 0.1 % nasal spray Place 1 spray into both nostrils 2 (two) times daily. Use in each nostril as directed (Patient not taking: No sig reported)   furosemide (LASIX) 20 MG tablet TAKE ONE TABLET  BY MOUTH DAILY (Patient not taking: Reported on 10/13/2020)   [DISCONTINUED] levothyroxine (SYNTHROID) 112 MCG tablet Take 1 tablet (112 mcg total) by mouth daily before breakfast.   No facility-administered encounter medications on file as of 11/05/2020.    Allergies (verified) Sulfa antibiotics, Sulfamethoxazole-trimethoprim, Sulfasalazine, Codeine, Crestor [rosuvastatin calcium], Hydrocodone-acetaminophen, Other, and Rosuvastatin   History: Past Medical History:  Diagnosis Date   Allergy    Arthritis    CAD (coronary artery disease)    s/p PTCI in 2003 and 2004 to the LAD.    Cataract    Chronic tubotympanic suppurative otitis  media, bilateral    Depression    Diabetes mellitus    Eustachian tube dysfunction    History of placement of ear tubes    Hyperlipidemia    Hypertension    Hypothyroid    Internal hemorrhoids    Migraine headache    Skin cancer, basal cell    Thrombocytopenia (HCC)    Thrombocytopenia, unspecified ([facility]) 05/08/2013   TIA (transient ischemic attack) 2006   per patient's report.  He was never officially given this diagnosis   Past Surgical History:  Procedure Laterality Date   COLONOSCOPY  2011   coronary artery disease status post placement     of drug-eluting stent in the LAD in 2003,eEF 65% then   esophogeal dilation     fatty tissue removed     from neck 2003   [place]  2007   mastoidectomy with tympanoplasty     POLYPECTOMY  2011   +TA   right toe bone spur surgery     THYROIDECTOMY, PARTIAL  07/2002   right thyroid   TONSILLECTOMY     UPPER GI ENDOSCOPY     Family History  Problem Relation Age of Onset   Mental illness Mother        alzheimers   Heart disease Mother    Heart disease Father        triple by pass   Hypertension Brother        Prostate Cancer   Prostate cancer Brother    Hypertension Brother    Hypertension Brother    COPD Brother    Colon cancer Neg Hx    Colon polyps Neg Hx    Rectal cancer Neg Hx    Stomach cancer Neg Hx    Esophageal cancer Neg Hx    Social History   Socioeconomic History   Marital status: Married    Spouse name: Scientist, research (medical)   Number of children: 0   Years of education: Not on file   Highest education level: Associate degree: occupational, Hotel manager, or vocational program  Occupational History   Occupation: retired    Fish farm manager: RETIRED    Comment: accountant  Tobacco Use   Smoking status: Former Smoker    Years: 5.00    Types: Cigarettes    Quit date: 10/10/1965    Years since quitting: 55.1   Smokeless tobacco: Never Used  Vaping Use   Vaping Use: Never used   Substance and Sexual Activity   Alcohol use: No    Alcohol/week: 0.0 standard drinks    Comment: wine rarely   Drug use: No   Sexual activity: Yes    Partners: Female  Other Topics Concern   Not on file  Social History Narrative   Pt lives with spouse at private home in 2 story home- he has no children- right handed- he drinks coffee daily, tea sometimes, soda not all the time.  Exercise-- treadmill, stationary bike   Right handed   Social Determinants of Health   Financial Resource Strain: Low Risk    Difficulty of Paying Living Expenses: Not hard at all  Food Insecurity: No Food Insecurity   Worried About Charity fundraiser in the Last Year: Never true   Ran Out of Food in the Last Year: Never true  Transportation Needs: No Transportation Needs   Lack of Transportation (Medical): No   Lack of Transportation (Non-Medical): No  Physical Activity: Sufficiently Active   Days of Exercise per Week: 7 days   Minutes of Exercise per Session: 60 min  Stress: No Stress Concern Present   Feeling of Stress : Not at all  Social Connections: Socially Integrated   Frequency of Communication with Friends and Family: More than three times a week   Frequency of Social Gatherings with Friends and Family: More than three times a week   Attends Religious Services: More than 4 times per year   Active Member of [employer] or Organizations: Yes   Attends Archivist Meetings: 1 to 4 times per year   Marital Status: Married    Tobacco Counseling Counseling given: Not Answered   Clinical Intake:  Pre-visit preparation completed: Yes  Pain : No/denies pain     Nutritional Status: BMI of 19-24  Normal Nutritional Risks: None Diabetes: Yes CBG done?: No Did pt. bring in CBG monitor from home?: No  How often do you need to have someone help you when you read instructions, pamphlets, or other written materials from your doctor or pharmacy?: 1 -  Never  Diabetic?No  Interpreter Needed?: No  Information entered by :: Caroleen Hamman LPN   Activities of Daily Living In your present state of health, do you have any difficulty performing the following activities: 11/05/2020  Hearing? Y  Comment hearing aids  Vision? N  Difficulty concentrating or making decisions? Y  Comment names  Walking or climbing stairs? N  Dressing or bathing? N  Doing errands, shopping? N  Preparing Food and eating ? N  Using the Toilet? N  In the past six months, have you accidently leaked urine? N  Do you have problems with loss of bowel control? N  Managing your Medications? N  Managing your Finances? N  Housekeeping or managing your Housekeeping? N  Some recent data might be hidden    Patient Care Team: Carollee Herter, Alferd Apa, DO as PCP - General Stanford Breed Denice Bors, MD as PCP - Cardiology (Cardiology) Lelon Perla, MD as Consulting Physician (Cardiology) Raynelle Bring, MD as Consulting Physician (Urology) Melissa Montane, MD as Consulting Physician (Otolaryngology) Ladene Artist, MD as Consulting Physician (Gastroenterology) Luberta Mutter, MD as Consulting Physician (Ophthalmology) Pieter Partridge, DO as Consulting Physician (Neurology) Macario Carls, MD as Referring Physician (Dermatology)  Indicate any recent Medical Services you may have received from other than [facility] providers in the past year (date may be approximate).     Assessment:   This is a routine wellness examination for Konstantin.  Hearing/Vision screen  Hearing Screening   '125Hz'  '250Hz'  '500Hz'  '1000Hz'  '2000Hz'  '3000Hz'  '4000Hz'  '6000Hz'  '8000Hz'   Right ear:           Left ear:           Comments: Bilateral hearing aids  Vision Screening Comments: Wears glasses sometimes Last eye exam-09/2020-Dr. McCuen  Dietary issues and exercise activities discussed: Current Exercise Habits: Home exercise routine, Type of exercise: stretching, Time (Minutes): 60,  Frequency (Times/Week): 7,  Weekly Exercise (Minutes/Week): 420, Intensity: Mild, Exercise limited by: None identified  Goals     Patient Stated     Maintain current healthy lifestyle      Depression Screen PHQ 2/9 Scores 11/05/2020 10/06/2016 09/24/2015 09/09/2014 08/27/2013 08/27/2013  PHQ - 2 Score 0 6 0 0 0 0  PHQ- 9 Score - 9 - - - -    Fall Risk Fall Risk  11/05/2020 10/13/2020 04/09/2020 08/08/2019 01/29/2019  Falls in the past year? 0 0 0 0 0  Number falls in past yr: 0 0 0 0 -  Injury with Fall? 0 0 0 0 -  Follow up Falls prevention discussed - - - Falls evaluation completed    FALL RISK PREVENTION PERTAINING TO THE HOME:  Any stairs in or around the home? Yes  If so, are there any without handrails? No  Home free of loose throw rugs in walkways, pet beds, electrical cords, etc? Yes  Adequate lighting in your home to reduce risk of falls? Yes   ASSISTIVE DEVICES UTILIZED TO PREVENT FALLS:  Life alert? No  Use of a cane, walker or w/c? No  Grab bars in the bathroom? Yes  Shower chair or bench in shower? No  Elevated toilet seat or a handicapped toilet? No   TIMED UP AND GO:  Was the test performed? Yes .  Length of time to ambulate 10 feet: 10 sec.   Gait steady and fast without use of assistive device  Cognitive Function:Normal cognitive status assessed by direct observation by this Nurse Health Advisor. No abnormalities found.   MMSE - Mini Mental State Exam 10/06/2016  Orientation to time 5  Orientation to Place 5  Registration 3  Attention/ Calculation 5  Recall 3  Language- name 2 objects 2  Language- repeat 1  Language- follow 3 step command 3  Language- read & follow direction 1  Write a sentence 1  Copy design 1  Total score 30     6CIT Screen 11/05/2020  What Year? 0 points  What month? 0 points  What time? 0 points  Count back from 20 0 points  Months in reverse 0 points  Repeat phrase 0 points  Total Score 0    Immunizations Immunization History  Administered  Date(s) Administered   Fluad Quad(high Dose 65+) 06/30/2019   Influenza Split 07/26/2011   Influenza Whole 08/30/2007, 07/08/2009, 07/06/2010   Influenza, High Dose Seasonal PF 07/11/2014, 10/06/2016, 07/23/2017   Influenza,inj,Quad PF,6+ Mos 07/05/2013   Influenza-Unspecified 08/19/2015, 08/02/2018, 08/06/2020   PFIZER(Purple Top)SARS-COV-2 Vaccination 10/30/2019, 11/20/2019, 07/17/2020   Pneumococcal Conjugate-13 12/17/2013   Pneumococcal Polysaccharide-23 04/23/2003, 07/08/2009, 01/14/2020   Td 02/19/2003   Tdap 09/19/2013   Zoster 02/21/2007   Zoster Recombinat (Shingrix) 07/25/2017, 09/26/2017    TDAP status: Up to date  Flu Vaccine status: Up to date  Pneumococcal vaccine status: Up to date  Covid-19 vaccine status: Completed vaccines  Qualifies for Shingles Vaccine? No   Zostavax completed Yes   Shingrix Completed?: Yes  Screening Tests Health Maintenance  Topic Date Due   HEMOGLOBIN A1C  01/26/2021   FOOT EXAM  04/23/2021   OPHTHALMOLOGY EXAM  09/30/2021   TETANUS/TDAP  09/20/2023   COLONOSCOPY (Pts 45-73yr Insurance coverage will need to be confirmed)  07/08/2024   INFLUENZA VACCINE  Completed   COVID-19 Vaccine  Completed   Hepatitis C Screening  Completed   PNA vac Low Risk Adult  Completed    Health Maintenance  There are no preventive care reminders to display for this patient.  Colorectal cancer screening: No longer required.   Lung Cancer Screening: (Low Dose CT Chest recommended if Age 57-80 years, 30 pack-year currently smoking OR have quit w/in 15years.) does not qualify.    Additional Screening:  Hepatitis C Screening:Completed 09/24/2015  Vision Screening: Recommended annual ophthalmology exams for early detection of glaucoma and other disorders of the eye. Is the patient up to date with their annual eye exam?  Yes  Who is the provider or what is the name of the office in which the patient attends annual eye exams?  Dr. Ellie Lunch   Dental Screening: Recommended annual dental exams for proper oral hygiene  Community Resource Referral / Chronic Care Management: CRR required this visit?  No   CCM required this visit?  No      Plan:     I have personally reviewed and noted the following in the patients chart:    Medical and social history  Use of alcohol, tobacco or illicit drugs   Current medications and supplements  Functional ability and status  Nutritional status  Physical activity  Advanced directives  List of other physicians  Hospitalizations, surgeries, and ER visits in previous 12 months  Vitals  Screenings to include cognitive, depression, and falls  Referrals and appointments  In addition, I have reviewed and discussed with patient certain preventive protocols, quality metrics, and best practice recommendations. A written personalized care plan for preventive services as well as general preventive health recommendations were provided to patient.   Patient to access avs on mychart.   Marta Antu, LPN   7/46/0029  Nurse Health Advisor  Nurse Notes: None

## 2020-11-05 ENCOUNTER — Ambulatory Visit: Payer: Medicare Other | Admitting: Family Medicine

## 2020-11-05 ENCOUNTER — Other Ambulatory Visit: Payer: Self-pay | Admitting: Family Medicine

## 2020-11-05 ENCOUNTER — Ambulatory Visit (INDEPENDENT_AMBULATORY_CARE_PROVIDER_SITE_OTHER): Payer: Medicare Other

## 2020-11-05 ENCOUNTER — Encounter: Payer: Self-pay | Admitting: Family Medicine

## 2020-11-05 ENCOUNTER — Other Ambulatory Visit: Payer: Self-pay

## 2020-11-05 ENCOUNTER — Other Ambulatory Visit: Payer: Self-pay | Admitting: Neurology

## 2020-11-05 VITALS — BP 160/84 | HR 64 | Temp 97.8°F | Resp 16 | Ht 69.0 in | Wt 163.0 lb

## 2020-11-05 DIAGNOSIS — M159 Polyosteoarthritis, unspecified: Secondary | ICD-10-CM

## 2020-11-05 DIAGNOSIS — E039 Hypothyroidism, unspecified: Secondary | ICD-10-CM

## 2020-11-05 DIAGNOSIS — Z0000 Encounter for general adult medical examination without abnormal findings: Secondary | ICD-10-CM

## 2020-11-05 MED ORDER — LEVOTHYROXINE SODIUM 112 MCG PO TABS: 112.0000 ug | ORAL_TABLET | Freq: Every day | ORAL | 1 refills | Status: DC

## 2020-11-05 NOTE — Patient Instructions (Signed)
[name] , Thank you for taking time to come for your Medicare Wellness Visit. I appreciate your ongoing commitment to your health goals. Please review the following plan we discussed and let me know if I can assist you in the future.   Screening recommendations/referrals: Colonoscopy: Completed 07/09/2019- No longer required after age 77. Recommended yearly ophthalmology/optometry visit for glaucoma screening and checkup Recommended yearly dental visit for hygiene and checkup  Vaccinations: Influenza vaccine: Up to date Pneumococcal vaccine: Completed vaccines Tdap vaccine: Up to date- Due-09/20/2023 Shingles vaccine: Completed vaccines   Covid-19: Completed vaccines  Advanced directives: Copy in chart  Conditions/risks identified: See problem list  Next appointment: Follow up in one year for your annual wellness visit. 11/11/2021 @ 1:20  Preventive Care 65 Years and Older, Male Preventive care refers to lifestyle choices and visits with your health care provider that can promote health and wellness. What does preventive care include?  A yearly physical exam. This is also called an annual well check.  Dental exams once or twice a year.  Routine eye exams. Ask your health care provider how often you should have your eyes checked.  Personal lifestyle choices, including:  Daily care of your teeth and gums.  Regular physical activity.  Eating a healthy diet.  Avoiding tobacco and drug use.  Limiting alcohol use.  Practicing safe sex.  Taking low doses of aspirin every day.  Taking vitamin and mineral supplements as recommended by your health care provider. What happens during an annual well check? The services and screenings done by your health care provider during your annual well check will depend on your age, overall health, lifestyle risk factors, and family history of disease. Counseling  Your health care provider may ask you questions about your:  Alcohol  use.  Tobacco use.  Drug use.  Emotional well-being.  Home and relationship well-being.  Sexual activity.  Eating habits.  History of falls.  Memory and ability to understand (cognition).  Work and work Statistician. Screening  You may have the following tests or measurements:  Height, weight, and BMI.  Blood pressure.  Lipid and cholesterol levels. These may be checked every 5 years, or more frequently if you are over 41 years old.  Skin check.  Lung cancer screening. You may have this screening every year starting at age 18 if you have a 30-pack-year history of smoking and currently smoke or have quit within the past 15 years.  Fecal occult blood test (FOBT) of the stool. You may have this test every year starting at age 80.  Flexible sigmoidoscopy or colonoscopy. You may have a sigmoidoscopy every 5 years or a colonoscopy every 10 years starting at age 62.  Prostate cancer screening. Recommendations will vary depending on your family history and other risks.  Hepatitis C blood test.  Hepatitis B blood test.  Sexually transmitted disease (STD) testing.  Diabetes screening. This is done by checking your blood sugar (glucose) after you have not eaten for a while (fasting). You may have this done every 1-3 years.  Abdominal aortic aneurysm (AAA) screening. You may need this if you are a current or former smoker.  Osteoporosis. You may be screened starting at age 67 if you are at high risk. Talk with your health care provider about your test results, treatment options, and if necessary, the need for more tests. Vaccines  Your health care provider may recommend certain vaccines, such as:  Influenza vaccine. This is recommended every year.  Tetanus, diphtheria,  and acellular pertussis (Tdap, Td) vaccine. You may need a Td booster every 10 years.  Zoster vaccine. You may need this after age 34.  Pneumococcal 13-valent conjugate (PCV13) vaccine. One dose is  recommended after age 61.  Pneumococcal polysaccharide (PPSV23) vaccine. One dose is recommended after age 37. Talk to your health care provider about which screenings and vaccines you need and how often you need them. This information is not intended to replace advice given to you by your health care provider. Make sure you discuss any questions you have with your health care provider. Document Released: 10/23/2015 Document Revised: 06/15/2016 Document Reviewed: 07/28/2015 Elsevier Interactive Patient Education  2017 [place] Prevention in the Home Falls can cause injuries. They can happen to people of all ages. There are many things you can do to make your home safe and to help prevent falls. What can I do on the outside of my home?  Regularly fix the edges of walkways and driveways and fix any cracks.  Remove anything that might make you trip as you walk through a door, such as a raised step or threshold.  Trim any bushes or trees on the path to your home.  Use bright outdoor lighting.  Clear any walking paths of anything that might make someone trip, such as rocks or tools.  Regularly check to see if handrails are loose or broken. Make sure that both sides of any steps have handrails.  Any raised decks and porches should have guardrails on the edges.  Have any leaves, snow, or ice cleared regularly.  Use sand or salt on walking paths during winter.  Clean up any spills in your garage right away. This includes oil or grease spills. What can I do in the bathroom?  Use night lights.  Install grab bars by the toilet and in the tub and shower. Do not use towel bars as grab bars.  Use non-skid mats or decals in the tub or shower.  If you need to sit down in the shower, use a plastic, non-slip stool.  Keep the floor dry. Clean up any water that spills on the floor as soon as it happens.  Remove soap buildup in the tub or shower regularly.  Attach bath mats  securely with double-sided non-slip rug tape.  Do not have throw rugs and other things on the floor that can make you trip. What can I do in the bedroom?  Use night lights.  Make sure that you have a light by your bed that is easy to reach.  Do not use any sheets or blankets that are too big for your bed. They should not hang down onto the floor.  Have a firm chair that has side arms. You can use this for support while you get dressed.  Do not have throw rugs and other things on the floor that can make you trip. What can I do in the kitchen?  Clean up any spills right away.  Avoid walking on wet floors.  Keep items that you use a lot in easy-to-reach places.  If you need to reach something above you, use a strong step stool that has a grab bar.  Keep electrical cords out of the way.  Do not use floor polish or wax that makes floors slippery. If you must use wax, use non-skid floor wax.  Do not have throw rugs and other things on the floor that can make you trip. What can I do with my  stairs?  Do not leave any items on the stairs.  Make sure that there are handrails on both sides of the stairs and use them. Fix handrails that are broken or loose. Make sure that handrails are as long as the stairways.  Check any carpeting to make sure that it is firmly attached to the stairs. Fix any carpet that is loose or worn.  Avoid having throw rugs at the top or bottom of the stairs. If you do have throw rugs, attach them to the floor with carpet tape.  Make sure that you have a light switch at the top of the stairs and the bottom of the stairs. If you do not have them, ask someone to add them for you. What else can I do to help prevent falls?  Wear shoes that:  Do not have high heels.  Have rubber bottoms.  Are comfortable and fit you well.  Are closed at the toe. Do not wear sandals.  If you use a stepladder:  Make sure that it is fully opened. Do not climb a closed  stepladder.  Make sure that both sides of the stepladder are locked into place.  Ask someone to hold it for you, if possible.  Clearly mark and make sure that you can see:  Any grab bars or handrails.  First and last steps.  Where the edge of each step is.  Use tools that help you move around (mobility aids) if they are needed. These include:  Canes.  Walkers.  Scooters.  Crutches.  Turn on the lights when you go into a dark area. Replace any light bulbs as soon as they burn out.  Set up your furniture so you have a clear path. Avoid moving your furniture around.  If any of your floors are uneven, fix them.  If there are any pets around you, be aware of where they are.  Review your medicines with your doctor. Some medicines can make you feel dizzy. This can increase your chance of falling. Ask your doctor what other things that you can do to help prevent falls. This information is not intended to replace advice given to you by your health care provider. Make sure you discuss any questions you have with your health care provider. Document Released: 07/23/2009 Document Revised: 03/03/2016 Document Reviewed: 10/31/2014 Elsevier Interactive Patient Education  2017 [employer].

## 2020-11-09 ENCOUNTER — Other Ambulatory Visit: Payer: Self-pay | Admitting: General Surgery

## 2020-11-09 DIAGNOSIS — C4322 Malignant melanoma of left ear and external auricular canal: Secondary | ICD-10-CM | POA: Diagnosis not present

## 2020-11-09 DIAGNOSIS — Z8679 Personal history of other diseases of the circulatory system: Secondary | ICD-10-CM | POA: Diagnosis not present

## 2020-11-10 ENCOUNTER — Other Ambulatory Visit: Payer: Self-pay

## 2020-11-10 ENCOUNTER — Ambulatory Visit (INDEPENDENT_AMBULATORY_CARE_PROVIDER_SITE_OTHER): Payer: Medicare Other | Admitting: Family Medicine

## 2020-11-10 ENCOUNTER — Encounter: Payer: Self-pay | Admitting: Family Medicine

## 2020-11-10 VITALS — BP 150/80 | HR 61 | Temp 98.0°F | Resp 18 | Ht 69.0 in | Wt 161.0 lb

## 2020-11-10 DIAGNOSIS — D696 Thrombocytopenia, unspecified: Secondary | ICD-10-CM

## 2020-11-10 DIAGNOSIS — I10 Essential (primary) hypertension: Secondary | ICD-10-CM

## 2020-11-10 DIAGNOSIS — D702 Other drug-induced agranulocytosis: Secondary | ICD-10-CM | POA: Diagnosis not present

## 2020-11-10 DIAGNOSIS — R413 Other amnesia: Secondary | ICD-10-CM

## 2020-11-10 DIAGNOSIS — E1151 Type 2 diabetes mellitus with diabetic peripheral angiopathy without gangrene: Secondary | ICD-10-CM | POA: Diagnosis not present

## 2020-11-10 DIAGNOSIS — E1165 Type 2 diabetes mellitus with hyperglycemia: Secondary | ICD-10-CM | POA: Diagnosis not present

## 2020-11-10 DIAGNOSIS — E785 Hyperlipidemia, unspecified: Secondary | ICD-10-CM | POA: Diagnosis not present

## 2020-11-10 DIAGNOSIS — D509 Iron deficiency anemia, unspecified: Secondary | ICD-10-CM | POA: Diagnosis not present

## 2020-11-10 DIAGNOSIS — IMO0002 Reserved for concepts with insufficient information to code with codable children: Secondary | ICD-10-CM

## 2020-11-10 DIAGNOSIS — E039 Hypothyroidism, unspecified: Secondary | ICD-10-CM | POA: Diagnosis not present

## 2020-11-10 HISTORY — DX: Other drug-induced agranulocytosis: D70.2

## 2020-11-10 LAB — CBC WITH DIFFERENTIAL/PLATELET
Basophils Absolute: 0.1 10*3/uL (ref 0.0–0.1)
Basophils Relative: 1.1 % (ref 0.0–3.0)
Eosinophils Absolute: 0.4 10*3/uL (ref 0.0–0.7)
Eosinophils Relative: 6 % — ABNORMAL HIGH (ref 0.0–5.0)
HCT: 36.8 % — ABNORMAL LOW (ref 39.0–52.0)
Hemoglobin: 11.6 g/dL — ABNORMAL LOW (ref 13.0–17.0)
Lymphocytes Relative: 11.9 % — ABNORMAL LOW (ref 12.0–46.0)
Lymphs Abs: 0.8 10*3/uL (ref 0.7–4.0)
MCHC: 31.4 g/dL (ref 30.0–36.0)
MCV: 76.7 fl — ABNORMAL LOW (ref 78.0–100.0)
Monocytes Absolute: 0.5 10*3/uL (ref 0.1–1.0)
Monocytes Relative: 7 % (ref 3.0–12.0)
Neutro Abs: 4.8 10*3/uL (ref 1.4–7.7)
Neutrophils Relative %: 74 % (ref 43.0–77.0)
Platelets: 181 10*3/uL (ref 150.0–400.0)
RBC: 4.8 Mil/uL (ref 4.22–5.81)
RDW: 16 % — ABNORMAL HIGH (ref 11.5–15.5)
WBC: 6.4 10*3/uL (ref 4.0–10.5)

## 2020-11-10 LAB — COMPREHENSIVE METABOLIC PANEL
ALT: 18 U/L (ref 0–53)
AST: 18 U/L (ref 0–37)
Albumin: 3.6 g/dL (ref 3.5–5.2)
Alkaline Phosphatase: 56 U/L (ref 39–117)
BUN: 28 mg/dL — ABNORMAL HIGH (ref 6–23)
CO2: 27 mEq/L (ref 19–32)
Calcium: 8.7 mg/dL (ref 8.4–10.5)
Chloride: 109 mEq/L (ref 96–112)
Creatinine, Ser: 1.13 mg/dL (ref 0.40–1.50)
GFR: 63 mL/min (ref >60.00)
Glucose, Bld: 106 mg/dL — ABNORMAL HIGH (ref 70–99)
Potassium: 3.9 mEq/L (ref 3.5–5.1)
Sodium: 141 mEq/L (ref 135–145)
Total Bilirubin: 0.5 mg/dL (ref 0.2–1.2)
Total Protein: 5.8 g/dL — ABNORMAL LOW (ref 6.0–8.3)

## 2020-11-10 LAB — MICROALBUMIN / CREATININE URINE RATIO
Creatinine,U: 131.6 mg/dL
Microalb Creat Ratio: 1.2 mg/g (ref 0.0–30.0)
Microalb, Ur: 1.6 mg/dL (ref 0.0–1.9)

## 2020-11-10 LAB — LIPID PANEL
Cholesterol: 99 mg/dL (ref 0–200)
HDL: 49.6 mg/dL (ref >39.00)
LDL Cholesterol: 39 mg/dL (ref 0–99)
NonHDL: 49.37
Total CHOL/HDL Ratio: 2
Triglycerides: 52 mg/dL (ref 0.0–149.0)
VLDL: 10.4 mg/dL (ref 0.0–40.0)

## 2020-11-10 LAB — TSH: TSH: 2.37 u[IU]/mL (ref 0.35–4.50)

## 2020-11-10 LAB — HEMOGLOBIN A1C: Hgb A1c MFr Bld: 6.1 % (ref 4.6–6.5)

## 2020-11-10 MED ORDER — LOSARTAN POTASSIUM 100 MG PO TABS: 100.0000 mg | ORAL_TABLET | Freq: Every day | ORAL | 3 refills | Status: DC

## 2020-11-10 NOTE — Assessment & Plan Note (Signed)
Tolerating statin, encouraged heart healthy diet, avoid trans fats, minimize simple carbs and saturated fats. Increase exercise as tolerated 

## 2020-11-10 NOTE — Progress Notes (Signed)
Patient ID: [name], male    DOB: 1946/06/25  Age: 75 y.o. MRN: [id number]    Subjective:  Subjective  HPI [name] presents for f/u bp , thyroid and dm.   Pt also c/o memory issues that are worsening--- he did well on his mmse during his medicare wellness but the pt still feels like there is a problem He also has knee pain and has an ortho but has not seen him for his knees   Review of Systems  Constitutional: Negative for appetite change, diaphoresis, fatigue and unexpected weight change.  Eyes: Negative for pain, redness and visual disturbance.  Respiratory: Negative for cough, chest tightness, shortness of breath and wheezing.   Cardiovascular: Negative for chest pain, palpitations and leg swelling.  Endocrine: Negative for cold intolerance, heat intolerance, polydipsia, polyphagia and polyuria.  Genitourinary: Negative for difficulty urinating, dysuria and frequency.  Musculoskeletal: Positive for arthralgias.  Neurological: Negative for dizziness, light-headedness, numbness and headaches.  Psychiatric/Behavioral: Positive for confusion. Negative for self-injury and sleep disturbance.    History Past Medical History:  Diagnosis Date  . Allergy   . Arthritis   . CAD (coronary artery disease)    s/p PTCI in 2003 and 2004 to the LAD.   Marland Kitchen Cataract   . Chronic tubotympanic suppurative otitis media, bilateral   . Depression   . Diabetes mellitus   . Eustachian tube dysfunction   . History of placement of ear tubes   . Hyperlipidemia   . Hypertension   . Hypothyroid   . Internal hemorrhoids   . Migraine headache   . Skin cancer, basal cell   . Thrombocytopenia ([place])   . Thrombocytopenia, unspecified ([place]) 05/08/2013  . TIA (transient ischemic attack) 2006   per patient's report.  He was never officially given this diagnosis    He has a past surgical history that includes Hernia repair (2007); fatty tissue removed; Thyroidectomy, partial (07/2002); coronary artery  disease status post placement; right toe bone spur surgery; Tonsillectomy; Colonoscopy (2011); Polypectomy (2011); Upper gi endoscopy; esophogeal dilation; and mastoidectomy with tympanoplasty.   His family history includes COPD in his brother; Heart disease in his father and mother; Hypertension in his brother, brother, and brother; Mental illness in his mother; Prostate cancer in his brother.He reports that he quit smoking about 55 years ago. His smoking use included cigarettes. He quit after 5.00 years of use. He has never used smokeless tobacco. He reports that he does not drink alcohol and does not use drugs.  Current Outpatient Medications on File Prior to Visit  Medication Sig Dispense Refill  . aspirin 81 MG tablet Take 81 mg by mouth daily.    Marland Kitchen atorvastatin (LIPITOR) 40 MG tablet TAKE ONE TABLET BY MOUTH DAILY AT 6PM 30 tablet 4  . Biotin 1000 MCG tablet Take 1,000 mcg by mouth daily.    . celecoxib (CELEBREX) 200 MG capsule Take 1 capsule (200 mg total) by mouth daily. 90 capsule 3  . cetirizine (ZYRTEC) 10 MG tablet Take 10 mg by mouth as needed for allergies.    . Cinnamon 500 MG capsule Take 1,000 mg by mouth 2 (two) times daily.    . Coenzyme Q10 (COQ-10) 100 MG capsule Take 1 capsule (100 mg total) by mouth daily.    . Cyanocobalamin (VITAMIN B-12) 1000 MCG SUBL     . Diclofenac Sodium 3 % GEL Qid prn 100 g 3  . ezetimibe (ZETIA) 10 MG tablet TAKE ONE TABLET BY MOUTH  DAILY 90 tablet 1  . Ginger, Zingiber officinalis, (GINGER ROOT) 550 MG CAPS Take 1 capsule by mouth 2 (two) times daily.    Marland Kitchen levothyroxine (SYNTHROID) 112 MCG tablet Take 1 tablet (112 mcg total) by mouth daily before breakfast. 90 tablet 1  . metFORMIN (GLUCOPHAGE) 500 MG tablet TAKE ONE TABLET BY MOUTH TWICE A DAY 60 tablet 2  . Multiple Vitamins-Minerals (THERAGRAN-M ADVANCED 50 PLUS PO) Take by mouth.    . Nerve Stimulator (CEFALY KIT) DEVI by Does not apply route.    Marland Kitchen OVER THE COUNTER MEDICATION Migra-eeze:  Take 1 gel capsule two times daily.    . pantoprazole (PROTONIX) 40 MG tablet TAKE ONE TABLET BY MOUTH DAILY 90 tablet 0  . propranolol ER (INDERAL LA) 160 MG SR capsule One tablet po qd 90 capsule 3  . tamsulosin (FLOMAX) 0.4 MG CAPS capsule Take 0.4 mg by mouth in the morning and at bedtime. Reported on 09/23/2015 30 capsule   . topiramate (TOPAMAX) 50 MG tablet TAKE ONE TABLET BY MOUTH AT BEDTIME 30 tablet 9  . Turmeric 500 MG CAPS Take 2 capsules by mouth 2 (two) times a day.    Marland Kitchen VITAMIN D PO Take 6,000 Units by mouth daily.     Marland Kitchen azelastine (ASTELIN) 0.1 % nasal spray Place 1 spray into both nostrils 2 (two) times daily. Use in each nostril as directed (Patient not taking: Reported on 11/10/2020) 30 mL 12   No current facility-administered medications on file prior to visit.     Objective:  Objective  Physical Exam Vitals and nursing note reviewed.  Constitutional:      General: He is sleeping. Vital signs are normal.     Appearance: He is well-developed and well-nourished.  HENT:     Head: Normocephalic and atraumatic.     Mouth/Throat:     Mouth: Oropharynx is clear and moist.  Eyes:     Extraocular Movements: EOM normal.     Pupils: Pupils are equal, round, and reactive to light.  Neck:     Thyroid: No thyromegaly.  Cardiovascular:     Rate and Rhythm: Normal rate and regular rhythm.     Heart sounds: No murmur heard.   Pulmonary:     Effort: Pulmonary effort is normal. No respiratory distress.     Breath sounds: Normal breath sounds. No wheezing or rales.  Chest:     Chest wall: No tenderness.  Musculoskeletal:        General: No tenderness or edema.     Cervical back: Normal range of motion and neck supple.  Skin:    General: Skin is warm and dry.  Neurological:     Mental Status: He is oriented to person, place, and time.  Psychiatric:        Mood and Affect: Mood and affect normal.        Behavior: Behavior normal.        Thought Content: Thought content  normal.        Judgment: Judgment normal.    BP (!) 150/80 (BP Location: Right Arm, Patient Position: Sitting, Cuff Size: Normal)   Pulse 61   Temp 98 F (36.7 C) (Oral)   Resp 18   Ht 5' 9" (1.753 m)   Wt 161 lb (73 kg)   SpO2 96%   BMI 23.78 kg/m  Wt Readings from Last 3 Encounters:  11/10/20 161 lb (73 kg)  11/05/20 163 lb (73.9 kg)  10/13/20 165 lb (74.8  kg)     Lab Results  Component Value Date   WBC 5.6 07/28/2020   HGB 13.2 07/28/2020   HCT 40.1 07/28/2020   PLT 163.0 07/28/2020   GLUCOSE 107 (H) 07/28/2020   CHOL 94 07/28/2020   TRIG 45.0 07/28/2020   HDL 45.90 07/28/2020   LDLCALC 39 07/28/2020   ALT 19 07/28/2020   AST 19 07/28/2020   NA 140 07/28/2020   K 3.9 07/28/2020   CL 110 07/28/2020   CREATININE 1.17 07/28/2020   BUN 22 07/28/2020   CO2 26 07/28/2020   TSH 1.19 04/23/2020   PSA 5.08 (H) 10/18/2018   INR 1.0 07/01/2019   HGBA1C 6.1 07/28/2020   MICROALBUR 1.9 04/23/2020    MR BRAIN W WO CONTRAST  Result Date: 01/02/2020 CLINICAL DATA:  Worsening headaches. Additional history provided by technologist: Patient reports chronic migraine headaches, recently worsening, history of basal cell skin cancer, history of multiple TIAs. Creatinine was obtained on site at [facility] at [address] Results: Creatinine 1.2 mg/dL (GFR 59). EXAM: MRI HEAD WITHOUT AND WITH CONTRAST TECHNIQUE: Multiplanar, multiecho pulse sequences of the brain and surrounding structures were obtained without and with intravenous contrast. CONTRAST:  1m MULTIHANCE GADOBENATE DIMEGLUMINE 529 MG/ML IV SOLN COMPARISON:  Brain MRI 08/11/2016. FINDINGS: Brain: There is no evidence of acute infarct. No evidence of intracranial mass. No midline shift or extra-axial fluid collection. No chronic intracranial blood products. Minimal scattered and ill-defined T2/FLAIR hyperintensity within the cerebral white matter is similar to prior MRI 08/11/2016. Findings are nonspecific but  consistent with chronic small vessel ischemic disease. No abnormal intracranial enhancement. Cerebral volume is normal for age. Vascular: Flow voids maintained within the proximal large arterial vessels. Expected enhancement within the dural venous sinuses. Skull and upper cervical spine: No focal marrow lesion. Sinuses/Orbits: Visualized orbits demonstrate no acute abnormality. Minimal ethmoid sinus mucosal thickening. Small left maxillary sinus mucous retention cysts. Bilateral mastoid effusions. IMPRESSION: 1. No evidence of intracranial mass or acute intracranial abnormality. 2. Minimal chronic small vessel ischemic changes within the cerebral white matter, similar to prior MRI 08/11/2016. 3. Mild ethmoid sinus mucosal thickening. Small left maxillary sinus mucous retention cyst. 4. Bilateral mastoid effusions. Electronically Signed   By: KKellie SimmeringDO   On: 01/02/2020 08:53     Assessment & Plan:  Plan  I have discontinued [name] furosemide and losartan. I am also having him start on losartan. Additionally, I am having him maintain his aspirin, Biotin, Cinnamon, Ginger Root, tamsulosin, OVER THE COUNTER MEDICATION, Cefaly Kit, azelastine, Multiple Vitamins-Minerals (THERAGRAN-M ADVANCED 50 PLUS PO), CoQ-10, Turmeric, VITAMIN D PO, propranolol ER, Diclofenac Sodium, Vitamin B-12, cetirizine, atorvastatin, metFORMIN, ezetimibe, pantoprazole, levothyroxine, topiramate, and celecoxib.  Meds ordered this encounter  Medications  . losartan (COZAAR) 100 MG tablet    Sig: Take 1 tablet (100 mg total) by mouth daily.    Dispense:  90 tablet    Refill:  3    Problem List Items Addressed This Visit      Unprioritized   Hypothyroidism   Relevant Orders   TSH    Other Visit Diagnoses    Primary hypertension    -  Primary   Relevant Medications   losartan (COZAAR) 100 MG tablet   Other Relevant Orders   Lipid panel   Hemoglobin A1c   Comprehensive metabolic panel   Microalbumin  / creatinine urine ratio   TSH   Type 2 diabetes mellitus with hyperglycemia, without long-term  current use of insulin (HCC)       Relevant Medications   losartan (COZAAR) 100 MG tablet   Other Relevant Orders   Hemoglobin A1c   Comprehensive metabolic panel   Microalbumin / creatinine urine ratio   Memory loss       Relevant Orders   Ambulatory referral to Neuropsychology      Follow-up: Return in about 6 months (around 05/10/2021), or if symptoms worsen or fail to improve, for hypertension, hyperlipidemia.  [name], DO

## 2020-11-10 NOTE — Assessment & Plan Note (Signed)
Refer to neuro psych and pt request even thought mmse was normal

## 2020-11-10 NOTE — Patient Instructions (Addendum)
Try voltaren gel  otc for your knees    [email or web address]">  DASH Eating Plan DASH stands for Dietary Approaches to Stop Hypertension. The DASH eating plan is a healthy eating plan that has been shown to:  Reduce high blood pressure (hypertension).  Reduce your risk for type 2 diabetes, heart disease, and stroke.  Help with weight loss. What are tips for following this plan? Reading food labels  Check food labels for the amount of salt (sodium) per serving. Choose foods with less than 5 percent of the Daily Value of sodium. Generally, foods with less than 300 milligrams (mg) of sodium per serving fit into this eating plan.  To find whole grains, look for the word "whole" as the first word in the ingredient list. Shopping  Buy products labeled as "low-sodium" or "no salt added."  Buy fresh foods. Avoid canned foods and pre-made or frozen meals. Cooking  Avoid adding salt when cooking. Use salt-free seasonings or herbs instead of table salt or sea salt. Check with your health care provider or pharmacist before using salt substitutes.  Do not fry foods. Cook foods using healthy methods such as baking, boiling, grilling, roasting, and broiling instead.  Cook with heart-healthy oils, such as olive, canola, avocado, soybean, or sunflower oil. Meal planning  Eat a balanced diet that includes: ? 4 or more servings of fruits and 4 or more servings of vegetables each day. Try to fill one-half of your plate with fruits and vegetables. ? 6-8 servings of whole grains each day. ? Less than 6 oz (170 g) of lean meat, poultry, or fish each day. A 3-oz (85-g) serving of meat is about the same size as a deck of cards. One egg equals 1 oz (28 g). ? 2-3 servings of low-fat dairy each day. One serving is 1 cup (237 mL). ? 1 serving of nuts, seeds, or beans 5 times each week. ? 2-3 servings of heart-healthy fats. Healthy fats called omega-3 fatty acids  are found in foods such as walnuts, flaxseeds, fortified milks, and eggs. These fats are also found in cold-water fish, such as sardines, salmon, and mackerel.  Limit how much you eat of: ? Canned or prepackaged foods. ? Food that is high in trans fat, such as some fried foods. ? Food that is high in saturated fat, such as fatty meat. ? Desserts and other sweets, sugary drinks, and other foods with added sugar. ? Full-fat dairy products.  Do not salt foods before eating.  Do not eat more than 4 egg yolks a week.  Try to eat at least 2 vegetarian meals a week.  Eat more home-cooked food and less restaurant, buffet, and fast food.   Lifestyle  When eating at a restaurant, ask that your food be prepared with less salt or no salt, if possible.  If you drink alcohol: ? Limit how much you use to:  0-1 drink a day for women who are not pregnant.  0-2 drinks a day for men. ? Be aware of how much alcohol is in your drink. In the [place] one drink equals one 12 oz bottle of beer (355 mL), one 5 oz glass of wine (148 mL), or one 1 oz glass of hard liquor (44 mL). General information  Avoid eating more than 2,300 mg of salt a day. If you have hypertension, you may need to reduce your sodium intake to 1,500 mg a day.  Work with your health care provider to maintain a healthy body weight  or to lose weight. Ask what an ideal weight is for you.  Get at least 30 minutes of exercise that causes your heart to beat faster (aerobic exercise) most days of the week. Activities may include walking, swimming, or biking.  Work with your health care provider or dietitian to adjust your eating plan to your individual calorie needs. What foods should I eat? Fruits All fresh, dried, or frozen fruit. Canned fruit in natural juice (without added sugar). Vegetables Fresh or frozen vegetables (raw, steamed, roasted, or grilled). Low-sodium or reduced-sodium tomato and vegetable juice. Low-sodium or  reduced-sodium tomato sauce and tomato paste. Low-sodium or reduced-sodium canned vegetables. Grains Whole-grain or whole-wheat bread. Whole-grain or whole-wheat pasta. Brown rice. [place]. Bulgur. Whole-grain and low-sodium cereals. Pita bread. Low-fat, low-sodium crackers. Whole-wheat flour tortillas. Meats and other proteins Skinless chicken or [place]. Ground chicken or [place]. Pork with fat trimmed off. Fish and seafood. Egg whites. Dried beans, peas, or lentils. Unsalted nuts, nut butters, and seeds. Unsalted canned beans. Lean cuts of beef with fat trimmed off. Low-sodium, lean precooked or cured meat, such as sausages or meat loaves. Dairy Low-fat (1%) or fat-free (skim) milk. Reduced-fat, low-fat, or fat-free cheeses. Nonfat, low-sodium ricotta or cottage cheese. Low-fat or nonfat yogurt. Low-fat, low-sodium cheese. Fats and oils Soft margarine without trans fats. Vegetable oil. Reduced-fat, low-fat, or light mayonnaise and salad dressings (reduced-sodium). Canola, safflower, olive, avocado, soybean, and sunflower oils. Avocado. Seasonings and condiments Herbs. Spices. Seasoning mixes without salt. Other foods Unsalted popcorn and pretzels. Fat-free sweets. The items listed above may not be a complete list of foods and beverages you can eat. Contact a dietitian for more information. What foods should I avoid? Fruits Canned fruit in a light or heavy syrup. Fried fruit. Fruit in cream or butter sauce. Vegetables Creamed or fried vegetables. Vegetables in a cheese sauce. Regular canned vegetables (not low-sodium or reduced-sodium). Regular canned tomato sauce and paste (not low-sodium or reduced-sodium). Regular tomato and vegetable juice (not low-sodium or reduced-sodium). Angie Fava. Olives. Grains Baked goods made with fat, such as croissants, muffins, or some breads. Dry pasta or rice meal packs. Meats and other proteins Fatty cuts of meat. Ribs. Fried meat. Berniece Salines. Bologna,  salami, and other precooked or cured meats, such as sausages or meat loaves. Fat from the back of a pig (fatback). Bratwurst. Salted nuts and seeds. Canned beans with added salt. Canned or smoked fish. Whole eggs or egg yolks. Chicken or [place] with skin. Dairy Whole or 2% milk, cream, and half-and-half. Whole or full-fat cream cheese. Whole-fat or sweetened yogurt. Full-fat cheese. Nondairy creamers. Whipped toppings. Processed cheese and cheese spreads. Fats and oils Butter. Stick margarine. Lard. Shortening. Ghee. Bacon fat. Tropical oils, such as coconut, palm kernel, or palm oil. Seasonings and condiments Onion salt, garlic salt, seasoned salt, table salt, and sea salt. Worcestershire sauce. Tartar sauce. Barbecue sauce. Teriyaki sauce. Soy sauce, including reduced-sodium. Steak sauce. Canned and packaged gravies. Fish sauce. Oyster sauce. Cocktail sauce. Store-bought horseradish. Ketchup. Mustard. Meat flavorings and tenderizers. Bouillon cubes. Hot sauces. Pre-made or packaged marinades. Pre-made or packaged taco seasonings. Relishes. Regular salad dressings. Other foods Salted popcorn and pretzels. The items listed above may not be a complete list of foods and beverages you should avoid. Contact a dietitian for more information. Where to find more information  National Heart, Lung, and Blood Institute: https://wilson-eaton.com/  American Heart Association: www.heart.org  Academy of Nutrition and Dietetics: www.eatright.[place]: www.kidney.org Summary  The DASH eating  plan is a healthy eating plan that has been shown to reduce high blood pressure (hypertension). It may also reduce your risk for type 2 diabetes, heart disease, and stroke.  When on the DASH eating plan, aim to eat more fresh fruits and vegetables, whole grains, lean proteins, low-fat dairy, and heart-healthy fats.  With the DASH eating plan, you should limit salt (sodium) intake to 2,300 mg a day. If you  have hypertension, you may need to reduce your sodium intake to 1,500 mg a day.  Work with your health care provider or dietitian to adjust your eating plan to your individual calorie needs. This information is not intended to replace advice given to you by your health care provider. Make sure you discuss any questions you have with your health care provider. Document Revised: 08/30/2019 Document Reviewed: 08/30/2019 Elsevier Patient Education  2021 [employer].

## 2020-11-10 NOTE — Assessment & Plan Note (Signed)
Poorly controlled will alter medications, encouraged DASH diet, minimize caffeine and obtain adequate sleep. Report concerning symptoms and follow up as directed and as needed  Inc losartan to 100 mg daily  

## 2020-11-10 NOTE — Assessment & Plan Note (Signed)
hgba1c to be checked, minimize simple carbs. Increase exercise as tolerated. Continue current meds  

## 2020-11-10 NOTE — Assessment & Plan Note (Signed)
Check labs con't synthroid 

## 2020-11-11 ENCOUNTER — Other Ambulatory Visit: Payer: Self-pay | Admitting: Family Medicine

## 2020-11-11 ENCOUNTER — Encounter: Payer: Self-pay | Admitting: Family Medicine

## 2020-11-11 DIAGNOSIS — D509 Iron deficiency anemia, unspecified: Secondary | ICD-10-CM

## 2020-11-12 ENCOUNTER — Other Ambulatory Visit: Payer: Self-pay

## 2020-11-12 ENCOUNTER — Encounter (HOSPITAL_BASED_OUTPATIENT_CLINIC_OR_DEPARTMENT_OTHER): Payer: Self-pay | Admitting: General Surgery

## 2020-11-12 DIAGNOSIS — H90A32 Mixed conductive and sensorineural hearing loss, unilateral, left ear with restricted hearing on the contralateral side: Secondary | ICD-10-CM | POA: Diagnosis not present

## 2020-11-12 DIAGNOSIS — Z9089 Acquired absence of other organs: Secondary | ICD-10-CM | POA: Diagnosis not present

## 2020-11-12 DIAGNOSIS — H903 Sensorineural hearing loss, bilateral: Secondary | ICD-10-CM | POA: Diagnosis not present

## 2020-11-12 DIAGNOSIS — H7012 Chronic mastoiditis, left ear: Secondary | ICD-10-CM | POA: Diagnosis not present

## 2020-11-12 DIAGNOSIS — C439 Malignant melanoma of skin, unspecified: Secondary | ICD-10-CM

## 2020-11-12 DIAGNOSIS — H6983 Other specified disorders of Eustachian tube, bilateral: Secondary | ICD-10-CM | POA: Diagnosis not present

## 2020-11-12 DIAGNOSIS — H90A21 Sensorineural hearing loss, unilateral, right ear, with restricted hearing on the contralateral side: Secondary | ICD-10-CM | POA: Diagnosis not present

## 2020-11-12 HISTORY — DX: Malignant melanoma of skin, unspecified: C43.9

## 2020-11-12 NOTE — Addendum Note (Signed)
Addended by: Sanda Linger on: 11/12/2020 10:08 AM   Modules accepted: Orders

## 2020-11-12 NOTE — Progress Notes (Signed)

## 2020-11-13 ENCOUNTER — Other Ambulatory Visit (HOSPITAL_COMMUNITY)
Admission: RE | Admit: 2020-11-13 | Discharge: 2020-11-13 | Disposition: A | Discharge status: Home / Self Care | Payer: Medicare Other | Source: Ambulatory Visit | Attending: General Surgery | Admitting: General Surgery

## 2020-11-13 DIAGNOSIS — Z20822 Contact with and (suspected) exposure to covid-19: Secondary | ICD-10-CM | POA: Diagnosis not present

## 2020-11-13 DIAGNOSIS — Z01812 Encounter for preprocedural laboratory examination: Secondary | ICD-10-CM | POA: Diagnosis not present

## 2020-11-13 LAB — SARS CORONAVIRUS 2 (TAT 6-24 HRS): SARS Coronavirus 2: NEGATIVE

## 2020-11-16 NOTE — H&P (Signed)
 [name] Appointment: 11/09/2020 1:45 PM Location: [facility] Patient #: [id number] DOB: 11/19/1945 Married / Language: English / Race: White Male   History of Present Illness (Marijo Quizon MD; 11/09/2020 2:30 PM) The patient is a 74 year old male who presents with malignant melanoma. Pt is a 74 yo M referred by Dr. Arthur Killgore for a new diagnosis of melanoma 10/2020. He sees dermatology 4 times a year. Dr. Llera found a spot behind his left ear that was concerning. A shave biopsy showed a melanoma 0.7 mm thick. This had no mitoses, no LVI, no perineural invasion, negative margins, no ulceration or regression, and no microsatellitosis. He hasn't had melanoma before, but has had a hemithyroidectomy and a left mastoid resection in the past. His brother has had prostate cancer.    [facility] accession number [id number]   Past Surgical History (Alvaro Aungst, MD; 11/09/2020 2:29 PM) Colon Polyp Removal - Colonoscopy  Foot Surgery  Left. Tonsillectomy   Diagnostic Studies History (Gabriel Paulding, MD; 11/09/2020 2:29 PM) Colonoscopy  within last year  Allergies (Kheana Marshall-McBride, CMA; 11/09/2020 1:53 PM) Sulfa Drugs  Codeine/Codeine Derivatives  Crestor *ANTIHYPERLIPIDEMICS*  Rosuvastatin Calcium *ANTIHYPERLIPIDEMICS*  HYDROcodone-Acetaminophen *ANALGESICS - OPIOID*  Allergies Reconciled   Medication History (Kheana Marshall-McBride, CMA; 11/09/2020 1:54 PM) Atorvastatin Calcium (40MG Tablet, Oral) Active. Celecoxib (200MG Capsule, Oral) Active. Diclofenac Sodium (3% Gel, External) Active. Losartan Potassium (50MG Tablet, Oral) Active. metFORMIN HCl (500MG Tablet, Oral) Active. Pantoprazole Sodium (40MG Tablet DR, Oral) Active. Propranolol HCl ER (160MG Capsule ER 24HR, Oral) Active. Tamsulosin HCl (0.4MG Capsule, Oral) Active. Topiramate (50MG Tablet, Oral) Active. Triamcinolone Acetonide (0.1% Cream, External)  Active. Ezetimibe (10MG Tablet, Oral) Active. Medications Reconciled  Social History (Javares Kaufhold, MD; 11/09/2020 2:29 PM) Caffeine use  Coffee, Tea. No drug use  Tobacco use  Former smoker.  Family History (Deema Juncaj, MD; 11/09/2020 2:29 PM) Alcohol Abuse  Brother. Arthritis  Father, Mother. Migraine Headache  Mother. Prostate Cancer  Brother. Respiratory Condition  Brother.  Other Problems (Zyiah Withington, MD; 11/09/2020 2:29 PM) Arthritis  Back Pain  Hemorrhoids  High blood pressure  Inguinal Hernia  Melanoma  Migraine Headache  Thyroid Cancer  Thyroid Disease     Review of Systems (Jaylyn Iyer MD; 11/09/2020 2:29 PM) General Not Present- Appetite Loss, Chills, Fatigue, Fever, Night Sweats, Weight Gain and Weight Loss. Skin Present- New Lesions. Not Present- Change in Wart/Mole, Dryness, Hives, Jaundice, Non-Healing Wounds, Rash and Ulcer. HEENT Present- Hearing Loss and Wears glasses/contact lenses. Not Present- Earache, Hoarseness, Nose Bleed, Oral Ulcers, Ringing in the Ears, Seasonal Allergies, Sinus Pain, Sore Throat, Visual Disturbances and Yellow Eyes. Respiratory Not Present- Bloody sputum, Chronic Cough, Difficulty Breathing, Snoring and Wheezing. Breast Not Present- Breast Mass, Breast Pain, Nipple Discharge and Skin Changes. Cardiovascular Not Present- Chest Pain, Difficulty Breathing Lying Down, Leg Cramps, Palpitations, Rapid Heart Rate, Shortness of Breath and Swelling of Extremities. Gastrointestinal Not Present- Abdominal Pain, Bloating, Bloody Stool, Change in Bowel Habits, Chronic diarrhea, Constipation, Difficulty Swallowing, Excessive gas, Gets full quickly at meals, Hemorrhoids, Indigestion, Nausea, Rectal Pain and Vomiting. Male Genitourinary Not Present- Blood in Urine, Change in Urinary Stream, Frequency, Impotence, Nocturia, Painful Urination, Urgency and Urine Leakage. Musculoskeletal Present- Back Pain and Joint Pain. Not Present-  Joint Stiffness, Muscle Pain, Muscle Weakness and Swelling of Extremities. Neurological Present- Headaches. Not Present- Decreased Memory, Fainting, Numbness, Seizures, Tingling, Tremor, Trouble walking and Weakness. Psychiatric Not Present- Anxiety, Bipolar, Change in Sleep Pattern, Depression, Fearful and   Frequent crying. Endocrine Not Present- Cold Intolerance, Excessive Hunger, Hair Changes, Heat Intolerance, Hot flashes and New Diabetes. Hematology Not Present- Blood Thinners, Easy Bruising, Excessive bleeding, Gland problems, HIV and Persistent Infections.  Vitals (Kheana Marshall-McBride CMA; 11/09/2020 1:55 PM) 11/09/2020 1:54 PM Weight: 163.13 lb Height: 69in Body Surface Area: 1.89 m Body Mass Index: 24.09 kg/m  Temp.: 98.68F  Pulse: 85 (Regular)  P.OX: 98% (Room air) BP: 146/86(Sitting, Left Arm, Standard)       Physical Exam Stark Klein MD; 11/09/2020 2:31 PM) General Mental Status-Alert. General Appearance-Consistent with stated age. Hydration-Well hydrated. Voice-Normal.  Integumentary Note: scar behind left ear. shave site further behind that. no residual pigment.   Head and Neck Head-normocephalic, atraumatic with no lesions or palpable masses. Trachea-midline. Thyroid Gland Characteristics - normal size and consistency.  Eye Eyeball - Bilateral-Extraocular movements intact. Sclera/Conjunctiva - Bilateral-No scleral icterus.  Chest and Lung Exam Chest and lung exam reveals -quiet, even and easy respiratory effort with no use of accessory muscles and on auscultation, normal breath sounds, no adventitious sounds and normal vocal resonance. Inspection Chest Wall - Normal. Back - normal.  Cardiovascular Cardiovascular examination reveals -normal heart sounds, regular rate and rhythm with no murmurs and normal pedal pulses bilaterally.  Abdomen Inspection Inspection of the abdomen reveals - No  Hernias. Palpation/Percussion Palpation and Percussion of the abdomen reveal - Soft, Non Tender, No Rebound tenderness, No Rigidity (guarding) and No hepatosplenomegaly. Auscultation Auscultation of the abdomen reveals - Bowel sounds normal.  Neurologic Neurologic evaluation reveals -alert and oriented x 3 with no impairment of recent or remote memory. Mental Status-Normal.  Musculoskeletal Global Assessment -Note: no gross deformities.  Normal Exam - Left-Upper Extremity Strength Normal and Lower Extremity Strength Normal. Normal Exam - Right-Upper Extremity Strength Normal and Lower Extremity Strength Normal.  Lymphatic Head & Neck  General Head & Neck Lymphatics: Bilateral - Description - Normal. Axillary  General Axillary Region: Bilateral - Description - Normal. Tenderness - Non Tender. Femoral & Inguinal  Generalized Femoral & Inguinal Lymphatics: Bilateral - Description - No Generalized lymphadenopathy.    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 Will plan wide local excision with advancement flap closure. I discussed the procedure and post op restrictions. I reviewed risks including bleeding, infection, wound breakdown, damage to adjacent structures like the greater auricular nerve, numbness, change in ear shape and deviation, recurrence, heart/lung issues and more.  He understands and wishes to proceed. Current Plans You are being scheduled for surgery- Our schedulers will call you.  You should hear from our office's scheduling department within 5 working days about the location, date, and time of surgery. We try to make accommodations for patient's preferences in scheduling surgery, but sometimes the OR schedule or the surgeon's schedule prevents [place] from making those accommodations.  If you have not heard from our office [phone])  in 5 working days, call the office and ask for your surgeon's nurse.  If you have other questions about your diagnosis, plan, or surgery, call the office and ask for your surgeon's nurse.  Pt Education - [facility] General Post-op HCI HISTORY OF CORONARY ARTERY DISEASE (Z86.79) Impression: He had stents wtih the latest in 2003. He has echo from 2020 that shows good LV function. Normal EKG from 7/21 and no chest pain/shortness of breath.    Signed electronically by Stark Klein, MD (11/09/2020 2:34 PM)

## 2020-11-16 NOTE — H&P (View-Only) (Signed)
[name] Appointment: 11/09/2020 1:45 PM Location: [facility] Patient #: [id number] DOB: 1946-08-21 Married / Language: [name] / Race: White Male   History of Present Illness Stark Klein MD; 11/09/2020 2:30 PM) The patient is a 75 year old male who presents with malignant melanoma. Pt is a 75 yo M referred by Dr. Cindie Crumbly for a new diagnosis of melanoma 10/2020. He sees dermatology 4 times a year. Dr. Pearline Cables found a spot behind his left ear that was concerning. A shave biopsy showed a melanoma 0.7 mm thick. This had no mitoses, no LVI, no perineural invasion, negative margins, no ulceration or regression, and no microsatellitosis. He hasn't had melanoma before, but has had a hemithyroidectomy and a left mastoid resection in the past. His brother has had prostate cancer.    [facility] accession number [id number]   Past Surgical History Stark Klein, MD; 11/09/2020 2:29 PM) Colon Polyp Removal - Colonoscopy  Foot Surgery  Left. Tonsillectomy   Diagnostic Studies History Stark Klein, MD; 11/09/2020 2:29 PM) Colonoscopy  within last year  Allergies [facility] Marshall-McBride, CMA; 11/09/2020 1:53 PM) Sulfa Drugs  Codeine/Codeine Derivatives  Crestor *ANTIHYPERLIPIDEMICS*  Rosuvastatin Calcium *ANTIHYPERLIPIDEMICS*  HYDROcodone-Acetaminophen *ANALGESICS - OPIOID*  Allergies Reconciled   Medication History (Kheana Marshall-McBride, CMA; 11/09/2020 1:54 PM) Atorvastatin Calcium (40MG  Tablet, Oral) Active. Celecoxib (200MG  Capsule, Oral) Active. Diclofenac Sodium (3% Gel, External) Active. Losartan Potassium (50MG  Tablet, Oral) Active. metFORMIN HCl (500MG  Tablet, Oral) Active. Pantoprazole Sodium (40MG  Tablet DR, Oral) Active. Propranolol HCl ER (160MG  Capsule ER 24HR, Oral) Active. Tamsulosin HCl (0.4MG  Capsule, Oral) Active. Topiramate (50MG  Tablet, Oral) Active. Triamcinolone Acetonide (0.1% Cream, External)  Active. Ezetimibe (10MG  Tablet, Oral) Active. Medications Reconciled  Social History Stark Klein, MD; 11/09/2020 2:29 PM) Caffeine use  Coffee, Tea. No drug use  Tobacco use  Former smoker.  Family History Stark Klein, MD; 11/09/2020 2:29 PM) Alcohol Abuse  Brother. Arthritis  Father, Mother. Migraine Headache  Mother. Prostate Cancer  Brother. Respiratory Condition  Brother.  Other Problems Stark Klein, MD; 11/09/2020 2:29 PM) Arthritis  Back Pain  Hemorrhoids  High blood pressure  Inguinal Hernia  Melanoma  Migraine Headache  Thyroid Cancer  Thyroid Disease     Review of Systems Stark Klein MD; 11/09/2020 2:29 PM) General Not Present- Appetite Loss, Chills, Fatigue, Fever, Night Sweats, Weight Gain and Weight Loss. Skin Present- New Lesions. Not Present- Change in Wart/Mole, Dryness, Hives, Jaundice, Non-Healing Wounds, Rash and Ulcer. HEENT Present- Hearing Loss and Wears glasses/contact lenses. Not Present- Earache, Hoarseness, Nose Bleed, Oral Ulcers, Ringing in the Ears, Seasonal Allergies, Sinus Pain, Sore Throat, Visual Disturbances and Yellow Eyes. Respiratory Not Present- Bloody sputum, Chronic Cough, Difficulty Breathing, Snoring and Wheezing. Breast Not Present- Breast Mass, Breast Pain, Nipple Discharge and Skin Changes. Cardiovascular Not Present- Chest Pain, Difficulty Breathing Lying Down, Leg Cramps, Palpitations, Rapid Heart Rate, Shortness of Breath and Swelling of Extremities. Gastrointestinal Not Present- Abdominal Pain, Bloating, Bloody Stool, Change in Bowel Habits, Chronic diarrhea, Constipation, Difficulty Swallowing, Excessive gas, Gets full quickly at meals, Hemorrhoids, Indigestion, Nausea, Rectal Pain and Vomiting. Male Genitourinary Not Present- Blood in Urine, Change in Urinary Stream, Frequency, Impotence, Nocturia, Painful Urination, Urgency and Urine Leakage. Musculoskeletal Present- Back Pain and Joint Pain. Not Present-  Joint Stiffness, Muscle Pain, Muscle Weakness and Swelling of Extremities. Neurological Present- Headaches. Not Present- Decreased Memory, Fainting, Numbness, Seizures, Tingling, Tremor, Trouble walking and Weakness. Psychiatric Not Present- Anxiety, Bipolar, Change in Sleep Pattern, Depression, Fearful and  Frequent crying. Endocrine Not Present- Cold Intolerance, Excessive Hunger, Hair Changes, Heat Intolerance, Hot flashes and New Diabetes. Hematology Not Present- Blood Thinners, Easy Bruising, Excessive bleeding, Gland problems, HIV and Persistent Infections.  Vitals (Kheana Marshall-McBride CMA; 11/09/2020 1:55 PM) 11/09/2020 1:54 PM Weight: 163.13 lb Height: 69in Body Surface Area: 1.89 m Body Mass Index: 24.09 kg/m  Temp.: 98.68F  Pulse: 85 (Regular)  P.OX: 98% (Room air) BP: 146/86(Sitting, Left Arm, Standard)       Physical Exam Stark Klein MD; 11/09/2020 2:31 PM) General Mental Status-Alert. General Appearance-Consistent with stated age. Hydration-Well hydrated. Voice-Normal.  Integumentary Note: scar behind left ear. shave site further behind that. no residual pigment.   Head and Neck Head-normocephalic, atraumatic with no lesions or palpable masses. Trachea-midline. Thyroid Gland Characteristics - normal size and consistency.  Eye Eyeball - Bilateral-Extraocular movements intact. Sclera/Conjunctiva - Bilateral-No scleral icterus.  Chest and Lung Exam Chest and lung exam reveals -quiet, even and easy respiratory effort with no use of accessory muscles and on auscultation, normal breath sounds, no adventitious sounds and normal vocal resonance. Inspection Chest Wall - Normal. Back - normal.  Cardiovascular Cardiovascular examination reveals -normal heart sounds, regular rate and rhythm with no murmurs and normal pedal pulses bilaterally.  Abdomen Inspection Inspection of the abdomen reveals - No  Hernias. Palpation/Percussion Palpation and Percussion of the abdomen reveal - Soft, Non Tender, No Rebound tenderness, No Rigidity (guarding) and No hepatosplenomegaly. Auscultation Auscultation of the abdomen reveals - Bowel sounds normal.  Neurologic Neurologic evaluation reveals -alert and oriented x 3 with no impairment of recent or remote memory. Mental Status-Normal.  Musculoskeletal Global Assessment -Note: no gross deformities.  Normal Exam - Left-Upper Extremity Strength Normal and Lower Extremity Strength Normal. Normal Exam - Right-Upper Extremity Strength Normal and Lower Extremity Strength Normal.  Lymphatic Head & Neck  General Head & Neck Lymphatics: Bilateral - Description - Normal. Axillary  General Axillary Region: Bilateral - Description - Normal. Tenderness - Non Tender. Femoral & Inguinal  Generalized Femoral & Inguinal Lymphatics: Bilateral - Description - No Generalized lymphadenopathy.    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 Will plan wide local excision with advancement flap closure. I discussed the procedure and post op restrictions. I reviewed risks including bleeding, infection, wound breakdown, damage to adjacent structures like the greater auricular nerve, numbness, change in ear shape and deviation, recurrence, heart/lung issues and more.  He understands and wishes to proceed. Current Plans You are being scheduled for surgery- Our schedulers will call you.  You should hear from our office's scheduling department within 5 working days about the location, date, and time of surgery. We try to make accommodations for patient's preferences in scheduling surgery, but sometimes the OR schedule or the surgeon's schedule prevents [place] from making those accommodations.  If you have not heard from our office [phone])  in 5 working days, call the office and ask for your surgeon's nurse.  If you have other questions about your diagnosis, plan, or surgery, call the office and ask for your surgeon's nurse.  Pt Education - [facility] General Post-op HCI HISTORY OF CORONARY ARTERY DISEASE (Z86.79) Impression: He had stents wtih the latest in 2003. He has echo from 2020 that shows good LV function. Normal EKG from 7/21 and no chest pain/shortness of breath.    Signed electronically by Stark Klein, MD (11/09/2020 2:34 PM)

## 2020-11-17 ENCOUNTER — Ambulatory Visit (HOSPITAL_BASED_OUTPATIENT_CLINIC_OR_DEPARTMENT_OTHER)
Admission: RE | Admit: 2020-11-17 | Discharge: 2020-11-17 | Disposition: A | Discharge status: Home / Self Care | Payer: Medicare Other | Attending: General Surgery | Admitting: General Surgery

## 2020-11-17 ENCOUNTER — Ambulatory Visit (HOSPITAL_BASED_OUTPATIENT_CLINIC_OR_DEPARTMENT_OTHER): Payer: Medicare Other | Admitting: Anesthesiology

## 2020-11-17 ENCOUNTER — Encounter (HOSPITAL_BASED_OUTPATIENT_CLINIC_OR_DEPARTMENT_OTHER)
Admission: RE | Disposition: A | Discharge status: Home / Self Care | Payer: Self-pay | Source: Home / Self Care | Attending: General Surgery

## 2020-11-17 ENCOUNTER — Other Ambulatory Visit: Payer: Self-pay

## 2020-11-17 ENCOUNTER — Encounter (HOSPITAL_BASED_OUTPATIENT_CLINIC_OR_DEPARTMENT_OTHER): Payer: Self-pay | Admitting: General Surgery

## 2020-11-17 DIAGNOSIS — D2222 Melanocytic nevi of left ear and external auricular canal: Secondary | ICD-10-CM | POA: Insufficient documentation

## 2020-11-17 DIAGNOSIS — E890 Postprocedural hypothyroidism: Secondary | ICD-10-CM | POA: Insufficient documentation

## 2020-11-17 DIAGNOSIS — Z885 Allergy status to narcotic agent status: Secondary | ICD-10-CM | POA: Insufficient documentation

## 2020-11-17 DIAGNOSIS — I25119 Atherosclerotic heart disease of native coronary artery with unspecified angina pectoris: Secondary | ICD-10-CM | POA: Diagnosis not present

## 2020-11-17 DIAGNOSIS — C4322 Malignant melanoma of left ear and external auricular canal: Secondary | ICD-10-CM | POA: Diagnosis not present

## 2020-11-17 DIAGNOSIS — Z888 Allergy status to other drugs, medicaments and biological substances status: Secondary | ICD-10-CM | POA: Diagnosis not present

## 2020-11-17 DIAGNOSIS — I10 Essential (primary) hypertension: Secondary | ICD-10-CM | POA: Diagnosis not present

## 2020-11-17 DIAGNOSIS — Z882 Allergy status to sulfonamides status: Secondary | ICD-10-CM | POA: Diagnosis not present

## 2020-11-17 DIAGNOSIS — E119 Type 2 diabetes mellitus without complications: Secondary | ICD-10-CM | POA: Diagnosis not present

## 2020-11-17 DIAGNOSIS — D0322 Melanoma in situ of left ear and external auricular canal: Secondary | ICD-10-CM | POA: Diagnosis not present

## 2020-11-17 DIAGNOSIS — E785 Hyperlipidemia, unspecified: Secondary | ICD-10-CM | POA: Diagnosis not present

## 2020-11-17 DIAGNOSIS — Z87891 Personal history of nicotine dependence: Secondary | ICD-10-CM | POA: Insufficient documentation

## 2020-11-17 HISTORY — PX: MELANOMA EXCISION: SHX5266

## 2020-11-17 LAB — GLUCOSE, CAPILLARY
Glucose-Capillary: 104 mg/dL — ABNORMAL HIGH (ref 70–99)
Glucose-Capillary: 77 mg/dL (ref 70–99)

## 2020-11-17 SURGERY — EXCISION, MELANOMA
Anesthesia: General | Site: Ear | Laterality: Left

## 2020-11-17 MED ORDER — ONDANSETRON HCL 4 MG/2ML IJ SOLN: 4.0000 mg | Freq: Once | INTRAMUSCULAR | Status: DC | PRN

## 2020-11-17 MED ORDER — CHLORHEXIDINE GLUCONATE CLOTH 2 % EX PADS: 6.0000 | MEDICATED_PAD | Freq: Once | CUTANEOUS | Status: DC

## 2020-11-17 MED ORDER — GLYCOPYRROLATE PF 0.2 MG/ML IJ SOSY
PREFILLED_SYRINGE | INTRAMUSCULAR | Status: AC
  Filled 2020-11-17: qty 1

## 2020-11-17 MED ORDER — TRAMADOL HCL 50 MG PO TABS: 25.0000 mg | ORAL_TABLET | Freq: Two times a day (BID) | ORAL | 0 refills | Status: DC | PRN

## 2020-11-17 MED ORDER — AMISULPRIDE (ANTIEMETIC) 5 MG/2ML IV SOLN: 10.0000 mg | Freq: Once | INTRAVENOUS | Status: DC | PRN

## 2020-11-17 MED ORDER — PROPOFOL 10 MG/ML IV BOLUS
INTRAVENOUS | Status: DC | PRN
  Administered 2020-11-17: 160 mg via INTRAVENOUS

## 2020-11-17 MED ORDER — LIDOCAINE-EPINEPHRINE (PF) 1 %-1:200000 IJ SOLN
INTRAMUSCULAR | Status: DC | PRN
  Administered 2020-11-17: 5 mL

## 2020-11-17 MED ORDER — FENTANYL CITRATE (PF) 100 MCG/2ML IJ SOLN: 25.0000 ug | INTRAMUSCULAR | Status: DC | PRN

## 2020-11-17 MED ORDER — LIDOCAINE HCL (CARDIAC) PF 100 MG/5ML IV SOSY
PREFILLED_SYRINGE | INTRAVENOUS | Status: DC | PRN
  Administered 2020-11-17: 80 mg via INTRAVENOUS

## 2020-11-17 MED ORDER — BUPIVACAINE HCL (PF) 0.25 % IJ SOLN
INTRAMUSCULAR | Status: DC | PRN
  Administered 2020-11-17: 5 mL

## 2020-11-17 MED ORDER — PHENYLEPHRINE HCL (PRESSORS) 10 MG/ML IV SOLN
INTRAVENOUS | Status: DC | PRN
  Administered 2020-11-17: 40 ug via INTRAVENOUS
  Administered 2020-11-17: 80 ug via INTRAVENOUS
  Administered 2020-11-17: 40 ug via INTRAVENOUS

## 2020-11-17 MED ORDER — ACETAMINOPHEN 500 MG PO TABS
1000.0000 mg | ORAL_TABLET | ORAL | Status: AC
  Administered 2020-11-17: 1000 mg via ORAL

## 2020-11-17 MED ORDER — EPHEDRINE SULFATE 50 MG/ML IJ SOLN
INTRAMUSCULAR | Status: DC | PRN
  Administered 2020-11-17 (×2): 15 mg via INTRAVENOUS
  Administered 2020-11-17 (×2): 10 mg via INTRAVENOUS

## 2020-11-17 MED ORDER — ONDANSETRON HCL 4 MG/2ML IJ SOLN
INTRAMUSCULAR | Status: DC | PRN
  Administered 2020-11-17: 4 mg via INTRAVENOUS

## 2020-11-17 MED ORDER — FENTANYL CITRATE (PF) 100 MCG/2ML IJ SOLN
INTRAMUSCULAR | Status: DC | PRN
  Administered 2020-11-17: 25 ug via INTRAVENOUS

## 2020-11-17 MED ORDER — CEFAZOLIN SODIUM-DEXTROSE 2-4 GM/100ML-% IV SOLN
2.0000 g | INTRAVENOUS | Status: AC
  Administered 2020-11-17: 2 g via INTRAVENOUS

## 2020-11-17 MED ORDER — FENTANYL CITRATE (PF) 100 MCG/2ML IJ SOLN
INTRAMUSCULAR | Status: AC
  Filled 2020-11-17: qty 2

## 2020-11-17 MED ORDER — EPHEDRINE 5 MG/ML INJ
INTRAVENOUS | Status: AC
  Filled 2020-11-17: qty 10

## 2020-11-17 MED ORDER — DEXAMETHASONE SODIUM PHOSPHATE 10 MG/ML IJ SOLN
INTRAMUSCULAR | Status: DC | PRN
  Administered 2020-11-17: 4 mg via INTRAVENOUS

## 2020-11-17 MED ORDER — FENTANYL CITRATE (PF) 100 MCG/2ML IJ SOLN
25.0000 ug | INTRAMUSCULAR | Status: DC | PRN
Start: 2020-11-17 — End: 2020-11-17

## 2020-11-17 MED ORDER — CEFAZOLIN SODIUM-DEXTROSE 2-4 GM/100ML-% IV SOLN
INTRAVENOUS | Status: AC
  Filled 2020-11-17: qty 100

## 2020-11-17 MED ORDER — GLYCOPYRROLATE 0.2 MG/ML IJ SOLN
INTRAMUSCULAR | Status: DC | PRN
  Administered 2020-11-17: .1 mg via INTRAVENOUS

## 2020-11-17 MED ORDER — ACETAMINOPHEN 500 MG PO TABS
ORAL_TABLET | ORAL | Status: AC
  Filled 2020-11-17: qty 2

## 2020-11-17 MED ORDER — LACTATED RINGERS IV SOLN: INTRAVENOUS | Status: DC

## 2020-11-17 SURGICAL SUPPLY — 63 items
ADH SKN CLS APL DERMABOND .7 (GAUZE/BANDAGES/DRESSINGS) ×1
APL PRP STRL LF DISP 70% ISPRP (MISCELLANEOUS)
APL SKNCLS STERI-STRIP NONHPOA (GAUZE/BANDAGES/DRESSINGS) ×1
BALL CTTN LRG ABS STRL LF (GAUZE/BANDAGES/DRESSINGS) ×1
BENZOIN TINCTURE PRP APPL 2/3 (GAUZE/BANDAGES/DRESSINGS) ×1 IMPLANT
BLADE HEX COATED 2.75 (ELECTRODE) ×1 IMPLANT
BLADE SURG 10 STRL SS (BLADE) ×1 IMPLANT
BLADE SURG 15 STRL LF DISP TIS (BLADE) ×1 IMPLANT
BLADE SURG 15 STRL SS (BLADE) ×2
BNDG COHESIVE 4X5 TAN STRL (GAUZE/BANDAGES/DRESSINGS) IMPLANT
CANISTER SUCT 1200ML W/VALVE (MISCELLANEOUS) ×1 IMPLANT
CHLORAPREP W/TINT 26 (MISCELLANEOUS) ×1 IMPLANT
CLIP VESOCCLUDE MED 6/CT (CLIP) IMPLANT
COTTONBALL LRG STERILE PKG (GAUZE/BANDAGES/DRESSINGS) ×1 IMPLANT
COVER BACK TABLE 60X90IN (DRAPES) ×1 IMPLANT
COVER MAYO STAND STRL (DRAPES) ×1 IMPLANT
COVER PROBE W GEL 5X96 (DRAPES) IMPLANT
COVER WAND RF STERILE (DRAPES) IMPLANT
DECANTER SPIKE VIAL GLASS SM (MISCELLANEOUS) ×1 IMPLANT
DERMABOND ADVANCED (GAUZE/BANDAGES/DRESSINGS) ×1
DERMABOND ADVANCED .7 DNX12 (GAUZE/BANDAGES/DRESSINGS) ×1 IMPLANT
DRAPE IMP U-DRAPE 54X76 (DRAPES) ×1 IMPLANT
DRAPE LAPAROTOMY 100X72 PEDS (DRAPES) IMPLANT
DRAPE UTILITY XL STRL (DRAPES) ×2 IMPLANT
DRSG TEGADERM 4X4.75 (GAUZE/BANDAGES/DRESSINGS) ×1 IMPLANT
ELECT REM PT RETURN 9FT ADLT (ELECTROSURGICAL) ×2
ELECTRODE REM PT RTRN 9FT ADLT (ELECTROSURGICAL) ×1 IMPLANT
GAUZE SPONGE 4X4 12PLY STRL LF (GAUZE/BANDAGES/DRESSINGS) ×2 IMPLANT
GLOVE SURG ENC MOIS LTX SZ6 (GLOVE) ×2 IMPLANT
GLOVE SURG UNDER POLY LF SZ6.5 (GLOVE) ×2 IMPLANT
GOWN STRL REUS W/ TWL LRG LVL3 (GOWN DISPOSABLE) ×1 IMPLANT
GOWN STRL REUS W/TWL 2XL LVL3 (GOWN DISPOSABLE) ×2 IMPLANT
GOWN STRL REUS W/TWL LRG LVL3 (GOWN DISPOSABLE) ×4
NDL HYPO 25X1 1.5 SAFETY (NEEDLE) ×1 IMPLANT
NDL SAFETY ECLIPSE 18X1.5 (NEEDLE) IMPLANT
NEEDLE HYPO 18GX1.5 SHARP (NEEDLE)
NEEDLE HYPO 25X1 1.5 SAFETY (NEEDLE) ×2 IMPLANT
NS IRRIG 1000ML POUR BTL (IV SOLUTION) ×1 IMPLANT
PACK BASIN DAY SURGERY FS (CUSTOM PROCEDURE TRAY) ×2 IMPLANT
PACK UNIVERSAL I (CUSTOM PROCEDURE TRAY) IMPLANT
PENCIL SMOKE EVACUATOR (MISCELLANEOUS) ×2 IMPLANT
SLEEVE SCD COMPRESS KNEE MED (MISCELLANEOUS) ×1 IMPLANT
SPONGE LAP 18X18 RF (DISPOSABLE) ×2 IMPLANT
STAPLER VISISTAT 35W (STAPLE) IMPLANT
STOCKINETTE IMPERVIOUS LG (DRAPES) IMPLANT
STRIP CLOSURE SKIN 1/2X4 (GAUZE/BANDAGES/DRESSINGS) ×1 IMPLANT
SUT ETHILON 2 0 FS 18 (SUTURE) IMPLANT
SUT ETHILON 3 0 PS 1 (SUTURE) ×1 IMPLANT
SUT MNCRL AB 4-0 PS2 18 (SUTURE) ×2 IMPLANT
SUT SILK 2 0 SH (SUTURE) ×2 IMPLANT
SUT SILK 3 0 TIES 17X18 (SUTURE) ×2
SUT SILK 3-0 18XBRD TIE BLK (SUTURE) IMPLANT
SUT VIC AB 2-0 SH 27 (SUTURE) ×2
SUT VIC AB 2-0 SH 27XBRD (SUTURE) IMPLANT
SUT VIC AB 3-0 SH 27 (SUTURE)
SUT VIC AB 3-0 SH 27X BRD (SUTURE) ×1 IMPLANT
SUT VICRYL 4-0 PS2 18IN ABS (SUTURE) ×1 IMPLANT
SYR CONTROL 10ML LL (SYRINGE) ×2 IMPLANT
SYR TB 1ML LL NO SAFETY (SYRINGE) ×1 IMPLANT
TOWEL GREEN STERILE FF (TOWEL DISPOSABLE) ×2 IMPLANT
TRAY DSU PREP LF (CUSTOM PROCEDURE TRAY) ×1 IMPLANT
TUBE CONNECTING 20X1/4 (TUBING) ×1 IMPLANT
YANKAUER SUCT BULB TIP NO VENT (SUCTIONS) ×1 IMPLANT

## 2020-11-17 NOTE — Transfer of Care (Signed)
Immediate Anesthesia Transfer of Care Note  Patient: [name]  Procedure(s) Performed: WIDE LOCAL EXCISION, ADVANCED FLAP CLOSURE LEFT POSTERIOR EAR MELANOMA (Left Ear)  Patient Location: PACU  Anesthesia Type:General  Level of Consciousness: awake, alert  and oriented  Airway & Oxygen Therapy: Patient Spontanous Breathing and Patient connected to face mask oxygen  Post-op Assessment: Report given to RN and Post -op Vital signs reviewed and stable  Post vital signs: Reviewed and stable  Last Vitals:  Vitals Value Taken Time  BP    Temp    Pulse 84 11/17/20 1748  Resp 18 11/17/20 1748  SpO2 100 % 11/17/20 1748  Vitals shown include unvalidated device data.  Last Pain:  Vitals:   11/17/20 1423  TempSrc: Oral  PainSc: 0-No pain         Complications: No complications documented.

## 2020-11-17 NOTE — Interval H&P Note (Signed)
History and Physical Interval Note:  11/17/2020 3:00 PM  [name]  has presented today for surgery, with the diagnosis of MELANOMA LEFT EAR.  The various methods of treatment have been discussed with the patient and family. After consideration of risks, benefits and other options for treatment, the patient has consented to  Procedure(s): WIDE LOCAL EXCISION, ADVANCED FLAP CLOSURE LEFT POSTERIOR EAR MELANOMA (Left) as a surgical intervention.  The patient's history has been reviewed, patient examined, no change in status, stable for surgery.  I have reviewed the patient's chart and labs.  Questions were answered to the patient's satisfaction.     Stark Klein

## 2020-11-17 NOTE — Anesthesia Preprocedure Evaluation (Addendum)
Anesthesia Evaluation  Patient identified by MRN, date of birth, ID band Patient awake    Reviewed: Allergy & Precautions, NPO status , Patient's Chart, lab work & pertinent test results  Airway Mallampati: II  TM Distance: >3 FB Neck ROM: Full    Dental  (+) Teeth Intact, Dental Advisory Given   Pulmonary former smoker,    breath sounds clear to auscultation       Cardiovascular hypertension (on Cozaar), Pt. on medications + CAD (2003/2004 on baby aspirin) and + Peripheral Vascular Disease   Rhythm:Regular Rate:Normal  EKG 04/2020: Normal sinus rhythm  ECHO 2020:  1. The left ventricle has normal systolic function with an ejection  fraction of 60-65%. The cavity size was normal. There is mildly increased  left ventricular wall thickness. Left ventricular diastolic Doppler  parameters are consistent with impaired  relaxation.  2. The right ventricle has normal systolic function. The cavity was  normal.  3. Left atrial size was mildly dilated.  4. The mitral valve is grossly normal. Mild thickening of the mitral  valve leaflet.  5. The tricuspid valve is grossly normal.  6. The aortic valve is tricuspid. Mild thickening of the aortic valve.  7. Normal LV systolic function; mild diastolic dysfunction; mild LVH;  mild LAE; mild TR.  8. The interatrial septum is aneurysmal.    Neuro/Psych  Headaches (on Propranolol ER), PSYCHIATRIC DISORDERS Depression    GI/Hepatic Neg liver ROS, GERD  Medicated and Controlled,  Endo/Other  diabetes, Poorly Controlled, Type 2, Oral Hypoglycemic AgentsHypothyroidism   Renal/GU   negative genitourinary   Musculoskeletal   Abdominal   Peds  Hematology  (+) anemia ,   Anesthesia Other Findings melanoma  Reproductive/Obstetrics                            Anesthesia Physical Anesthesia Plan  ASA: III  Anesthesia Plan: General   Post-op Pain  Management:    Induction: Intravenous  PONV Risk Score and Plan: 3 and Ondansetron, Dexamethasone and Treatment may vary due to age or medical condition  Airway Management Planned: Oral ETT  Additional Equipment:   Intra-op Plan:   Post-operative Plan: Extubation in OR  Informed Consent:   Plan Discussed with: CRNA and Anesthesiologist  Anesthesia Plan Comments:         Anesthesia Quick Evaluation

## 2020-11-17 NOTE — Op Note (Signed)
PRE-OPERATIVE DIAGNOSIS: cT1aN0 left posterior auricular melanoma  POST-OPERATIVE DIAGNOSIS:  Same  PROCEDURE:  Procedure(s): Wide local excision 0.5-1 cm margins, advancement flap closure for defect 4.5 cm x 2.1 cm  SURGEON:  Surgeon(s): Stark Klein, MD  ASSIST:  Eben Burow, MD PGY6  ANESTHESIA:   local and general  DRAINS: none   LOCAL MEDICATIONS USED:  MARCAINE    and XYLOCAINE   SPECIMEN:  Source of Specimen:  wide local excision left posterior auricular melanoma   FINDINGS:  No gross residual disease.    DISPOSITION OF SPECIMEN:  PATHOLOGY  COUNTS:  YES  PLAN OF CARE: Discharge to home after PACU  PATIENT DISPOSITION:  PACU - hemodynamically stable.    PROCEDURE:   Pt was identified in the holding area, taken to the OR, and placed supine on the OR table.  General anesthesia was induced.  Time out was performed according to the surgical safety checklist.  When all was correct, we continued.    The patient was placed into the supine position with the head turned to the right.  The left posterior auricular region was prepped and draped in sterile fashion.  The melanoma was identified and 0.5 cm margins were marked out due to the location behind the ear.  Local was administered under the melanoma and the adjacent tissue.  A #10 blade was used to incise the skin around the melanoma in an ellipse derived from the markings.  The cautery was used to take the dissection down to the fascia.  The skin was marked in situ with orientation sutures.  The cautery was used to take the specimen off the fascia, and it was passed off the table.    Skin hooks were used to elevate the edges of the incision and the skin was freed up in all directions.  This was pulled together in an longitudinal orientation. The skin was pulled together to check the tension. Deep interrupted 2-0 vicryl sutures were placed to relieve tension.  The skin was then reapproximated with 4-0 monocryl  running subcuticular sutures.  Two 2-0 nylon horizontal mattress sutures were placed as well.    The melanoma site was cleaned, dried, and dressed with Benzoin, steristrips, gauze, and tegaderm.    Needle, sponge, and instrument counts were correct.  The patient was awakened from anesthesia and taken to the PACU in stable condition.

## 2020-11-17 NOTE — Discharge Instructions (Addendum)
[place] Office Phone Number [phone]   POST OP INSTRUCTIONS  Always review your discharge instruction sheet given to you by the facility where your surgery was performed.  IF YOU HAVE DISABILITY OR FAMILY LEAVE FORMS, YOU MUST BRING THEM TO THE OFFICE FOR PROCESSING.  DO NOT GIVE THEM TO YOUR DOCTOR.  1. A prescription for pain medication may be given to you upon discharge.  Take your pain medication as prescribed, if needed.  If narcotic pain medicine is not needed, then you may take acetaminophen (Tylenol) or ibuprofen (Advil) as needed. *You had 1000 mg of Tylenol at 2:30pm 2. Take your usually prescribed medications unless otherwise directed 3. If you need a refill on your pain medication, please contact your pharmacy.  They will contact our office to request authorization.  Prescriptions will not be filled after 5pm or on week-ends. 4. You should eat very light the first 24 hours after surgery, such as soup, crackers, pudding, etc.  Resume your normal diet the day after surgery 5. It is common to experience some constipation if taking pain medication after surgery.  Increasing fluid intake and taking a stool softener will usually help or prevent this problem from occurring.  A mild laxative (Milk of Magnesia or Miralax) should be taken according to package directions if there are no bowel movements after 48 hours. 6. You may shower in 48 hours.  The surgical glue will flake off in 2-3 weeks.   7. ACTIVITIES:  No strenuous activity or heavy lifting for 1 week.   a. You may drive when you no longer are taking prescription pain medication, you can comfortably wear a seatbelt, and you can safely maneuver your car and apply brakes. b. RETURN TO WORK:  __________1 week if applicable. _______________ Dennis Bast should see your doctor in the office for a follow-up appointment approximately three-four weeks after your surgery.    WHEN TO CALL YOUR DOCTOR: 1. Fever over  101.0 2. Nausea and/or vomiting. 3. Extreme swelling or bruising. 4. Continued bleeding from incision. 5. Increased pain, redness, or drainage from the incision.  The clinic staff is available to answer your questions during regular business hours.  Please dont hesitate to call and ask to speak to one of the nurses for clinical concerns.  If you have a medical emergency, go to the nearest emergency room or call 911.  A surgeon from [facility] is always on call at the hospital.  For further questions, please visit [email or web address]    Post Anesthesia Home Care Instructions  Activity: Get plenty of rest for the remainder of the day. A responsible individual must stay with you for 24 hours following the procedure.  For the next 24 hours, DO NOT: -Drive a car -Paediatric nurse -Drink alcoholic beverages -Take any medication unless instructed by your physician -Make any legal decisions or sign important papers.  Meals: Start with liquid foods such as gelatin or soup. Progress to regular foods as tolerated. Avoid greasy, spicy, heavy foods. If nausea and/or vomiting occur, drink only clear liquids until the nausea and/or vomiting subsides. Call your physician if vomiting continues.  Special Instructions/Symptoms: Your throat may feel dry or sore from the anesthesia or the breathing tube placed in your throat during surgery. If this causes discomfort, gargle with warm salt water. The discomfort should disappear within 24 hours.  If you had a scopolamine patch placed behind your ear for the management of post- operative nausea and/or vomiting:  1. The medication in  the patch is effective for 72 hours, after which it should be removed.  Wrap patch in a tissue and discard in the trash. Wash hands thoroughly with soap and water. 2. You may remove the patch earlier than 72 hours if you experience unpleasant side effects which may include dry mouth, dizziness or visual  disturbances. 3. Avoid touching the patch. Wash your hands with soap and water after contact with the patch.

## 2020-11-17 NOTE — Anesthesia Procedure Notes (Signed)
Procedure Name: LMA Insertion Date/Time: 11/17/2020 4:37 PM Performed by: Lavonia Dana, CRNA Pre-anesthesia Checklist: Patient identified, Emergency Drugs available, Suction available and Patient being monitored Patient Re-evaluated:Patient Re-evaluated prior to induction Oxygen Delivery Method: Circle system utilized Preoxygenation: Pre-oxygenation with 100% oxygen Induction Type: IV induction Ventilation: Mask ventilation without difficulty LMA: LMA inserted LMA Size: 4.0 Number of attempts: 1 Airway Equipment and Method: Bite block Placement Confirmation: positive ETCO2 Tube secured with: Tape Dental Injury: Teeth and Oropharynx as per pre-operative assessment

## 2020-11-18 ENCOUNTER — Encounter (HOSPITAL_BASED_OUTPATIENT_CLINIC_OR_DEPARTMENT_OTHER): Payer: Self-pay | Admitting: General Surgery

## 2020-11-18 NOTE — Anesthesia Postprocedure Evaluation (Signed)
Anesthesia Post Note  Patient: [name]  Procedure(s) Performed: WIDE LOCAL EXCISION, ADVANCED FLAP CLOSURE LEFT POSTERIOR EAR MELANOMA (Left Ear)     Patient location during evaluation: PACU Anesthesia Type: General Level of consciousness: awake and alert Pain management: pain level controlled Vital Signs Assessment: post-procedure vital signs reviewed and stable Respiratory status: spontaneous breathing, nonlabored ventilation, respiratory function stable and patient connected to nasal cannula oxygen Cardiovascular status: blood pressure returned to baseline and stable Postop Assessment: no apparent nausea or vomiting Anesthetic complications: no   No complications documented.  Last Vitals:  Vitals:   11/17/20 1800 11/17/20 1813  BP: (!) 168/79 (!) 168/88  Pulse: 74 77  Resp: 17 18  Temp:  36.6 C  SpO2: 97% 98%    Last Pain:  Vitals:   11/18/20 1122  TempSrc:   PainSc: 1                  Angellina Ferdinand

## 2020-11-20 DIAGNOSIS — C434 Malignant melanoma of scalp and neck: Secondary | ICD-10-CM | POA: Diagnosis not present

## 2020-11-23 LAB — SURGICAL PATHOLOGY

## 2020-11-25 ENCOUNTER — Other Ambulatory Visit: Payer: Self-pay | Admitting: General Surgery

## 2020-11-25 ENCOUNTER — Telehealth: Payer: Self-pay | Admitting: General Surgery

## 2020-11-25 NOTE — Telephone Encounter (Signed)
Discussed pathology with patient.    He will need reexcision. Discussed strategy of reexcision with temporary closure and follow up closure with Dr. Iran Planas.

## 2020-11-27 ENCOUNTER — Other Ambulatory Visit (HOSPITAL_COMMUNITY)
Admission: RE | Admit: 2020-11-27 | Discharge: 2020-11-27 | Disposition: A | Discharge status: Home / Self Care | Payer: Medicare Other | Source: Ambulatory Visit | Attending: General Surgery | Admitting: General Surgery

## 2020-11-27 DIAGNOSIS — Z20822 Contact with and (suspected) exposure to covid-19: Secondary | ICD-10-CM | POA: Insufficient documentation

## 2020-11-27 DIAGNOSIS — C4330 Malignant melanoma of unspecified part of face: Secondary | ICD-10-CM | POA: Diagnosis not present

## 2020-11-27 DIAGNOSIS — Z01812 Encounter for preprocedural laboratory examination: Secondary | ICD-10-CM | POA: Insufficient documentation

## 2020-11-28 LAB — SARS CORONAVIRUS 2 (TAT 6-24 HRS): SARS Coronavirus 2: NEGATIVE

## 2020-11-30 ENCOUNTER — Other Ambulatory Visit: Payer: Self-pay

## 2020-11-30 ENCOUNTER — Encounter (HOSPITAL_COMMUNITY): Payer: Self-pay | Admitting: General Surgery

## 2020-11-30 NOTE — Anesthesia Preprocedure Evaluation (Addendum)
Anesthesia Evaluation  Patient identified by MRN, date of birth, ID band Patient awake    Reviewed: Allergy & Precautions, NPO status , Patient's Chart, lab work & pertinent test results  Airway Mallampati: II  TM Distance: >3 FB Neck ROM: Full    Dental  (+) Dental Advisory Given   Pulmonary former smoker,    breath sounds clear to auscultation       Cardiovascular hypertension, Pt. on medications and Pt. on home beta blockers + CAD (2003/2004 on baby aspirin) and + Peripheral Vascular Disease   Rhythm:Regular Rate:Normal  EKG 04/2020: Normal sinus rhythm  ECHO 2020:  1. The left ventricle has normal systolic function with an ejection  fraction of 60-65%. The cavity size was normal. There is mildly increased  left ventricular wall thickness. Left ventricular diastolic Doppler  parameters are consistent with impaired  relaxation.  2. The right ventricle has normal systolic function. The cavity was  normal.  3. Left atrial size was mildly dilated.  4. The mitral valve is grossly normal. Mild thickening of the mitral  valve leaflet.  5. The tricuspid valve is grossly normal.  6. The aortic valve is tricuspid. Mild thickening of the aortic valve.  7. Normal LV systolic function; mild diastolic dysfunction; mild LVH;  mild LAE; mild TR.  8. The interatrial septum is aneurysmal.    Neuro/Psych  Headaches (on Propranolol ER), PSYCHIATRIC DISORDERS Depression    GI/Hepatic Neg liver ROS, GERD  Medicated and Controlled,  Endo/Other  diabetes, Poorly Controlled, Type 2, Oral Hypoglycemic AgentsHypothyroidism   Renal/GU   negative genitourinary   Musculoskeletal   Abdominal   Peds  Hematology  (+) anemia ,   Anesthesia Other Findings melanoma  Reproductive/Obstetrics                            Anesthesia Physical  Anesthesia Plan  ASA: III  Anesthesia Plan: MAC   Post-op Pain  Management:    Induction:   PONV Risk Score and Plan: 3 and Ondansetron, Dexamethasone and Treatment may vary due to age or medical condition  Airway Management Planned: Natural Airway and Simple Face Mask  Additional Equipment:   Intra-op Plan:   Post-operative Plan:   Informed Consent: I have reviewed the patients History and Physical, chart, labs and discussed the procedure including the risks, benefits and alternatives for the proposed anesthesia with the patient or authorized representative who has indicated his/her understanding and acceptance.       Plan Discussed with: CRNA  Anesthesia Plan Comments:        Anesthesia Quick Evaluation

## 2020-11-30 NOTE — Progress Notes (Addendum)
[name] denies chest pain or shortness of breath. Patient tested negative for Covid on 10/27/20 and has been in quarantine since that time. [name] has type II diabetes, patient checks CBG occassionally. I instructed patient to not take Metformin in am. I instructed patient to check CBG after awaking and every 2 hours until arrival  to the hospital.  I Instructed patient if CBG is less than 70 to drink 1/2 cup of a clear juice. Recheck CBG in 15 minutes.

## 2020-12-01 ENCOUNTER — Ambulatory Visit (HOSPITAL_COMMUNITY): Payer: Medicare Other | Admitting: Anesthesiology

## 2020-12-01 ENCOUNTER — Encounter (HOSPITAL_COMMUNITY)
Admission: RE | Disposition: A | Discharge status: Home / Self Care | Payer: Self-pay | Source: Ambulatory Visit | Attending: General Surgery

## 2020-12-01 ENCOUNTER — Ambulatory Visit (HOSPITAL_COMMUNITY)
Admission: RE | Admit: 2020-12-01 | Discharge: 2020-12-01 | Disposition: A | Discharge status: Home / Self Care | Payer: Medicare Other | Source: Ambulatory Visit | Attending: General Surgery | Admitting: General Surgery

## 2020-12-01 ENCOUNTER — Encounter (HOSPITAL_COMMUNITY): Payer: Self-pay | Admitting: General Surgery

## 2020-12-01 DIAGNOSIS — Z888 Allergy status to other drugs, medicaments and biological substances status: Secondary | ICD-10-CM | POA: Diagnosis not present

## 2020-12-01 DIAGNOSIS — Z7984 Long term (current) use of oral hypoglycemic drugs: Secondary | ICD-10-CM | POA: Diagnosis not present

## 2020-12-01 DIAGNOSIS — I2510 Atherosclerotic heart disease of native coronary artery without angina pectoris: Secondary | ICD-10-CM | POA: Diagnosis not present

## 2020-12-01 DIAGNOSIS — Z885 Allergy status to narcotic agent status: Secondary | ICD-10-CM | POA: Insufficient documentation

## 2020-12-01 DIAGNOSIS — Z79899 Other long term (current) drug therapy: Secondary | ICD-10-CM | POA: Insufficient documentation

## 2020-12-01 DIAGNOSIS — Z882 Allergy status to sulfonamides status: Secondary | ICD-10-CM | POA: Diagnosis not present

## 2020-12-01 DIAGNOSIS — C4322 Malignant melanoma of left ear and external auricular canal: Secondary | ICD-10-CM | POA: Insufficient documentation

## 2020-12-01 DIAGNOSIS — Z87891 Personal history of nicotine dependence: Secondary | ICD-10-CM | POA: Insufficient documentation

## 2020-12-01 DIAGNOSIS — Z791 Long term (current) use of non-steroidal anti-inflammatories (NSAID): Secondary | ICD-10-CM | POA: Insufficient documentation

## 2020-12-01 DIAGNOSIS — E039 Hypothyroidism, unspecified: Secondary | ICD-10-CM | POA: Diagnosis not present

## 2020-12-01 DIAGNOSIS — D0322 Melanoma in situ of left ear and external auricular canal: Secondary | ICD-10-CM | POA: Diagnosis not present

## 2020-12-01 DIAGNOSIS — I10 Essential (primary) hypertension: Secondary | ICD-10-CM | POA: Diagnosis not present

## 2020-12-01 HISTORY — DX: Type 2 diabetes mellitus without complications: E11.9

## 2020-12-01 HISTORY — PX: MELANOMA EXCISION: SHX5266

## 2020-12-01 HISTORY — DX: Anxiety disorder, unspecified: F41.9

## 2020-12-01 LAB — GLUCOSE, CAPILLARY
Glucose-Capillary: 105 mg/dL — ABNORMAL HIGH (ref 70–99)
Glucose-Capillary: 107 mg/dL — ABNORMAL HIGH (ref 70–99)

## 2020-12-01 SURGERY — EXCISION, MELANOMA
Anesthesia: Monitor Anesthesia Care | Site: Ear | Laterality: Left

## 2020-12-01 MED ORDER — LIDOCAINE HCL 1 % IJ SOLN
INTRAMUSCULAR | Status: AC
  Filled 2020-12-01: qty 20

## 2020-12-01 MED ORDER — PROPOFOL 10 MG/ML IV BOLUS
INTRAVENOUS | Status: AC
  Filled 2020-12-01: qty 20

## 2020-12-01 MED ORDER — FENTANYL CITRATE (PF) 100 MCG/2ML IJ SOLN: 25.0000 ug | INTRAMUSCULAR | Status: DC | PRN

## 2020-12-01 MED ORDER — LACTATED RINGERS IV SOLN: INTRAVENOUS | Status: DC

## 2020-12-01 MED ORDER — LIDOCAINE HCL 1 % IJ SOLN
INTRAMUSCULAR | Status: DC | PRN
  Administered 2020-12-01: 12 mL via INTRAMUSCULAR

## 2020-12-01 MED ORDER — CHLORHEXIDINE GLUCONATE CLOTH 2 % EX PADS: 6.0000 | MEDICATED_PAD | Freq: Once | CUTANEOUS | Status: DC

## 2020-12-01 MED ORDER — MIDAZOLAM HCL 2 MG/2ML IJ SOLN
INTRAMUSCULAR | Status: DC | PRN
  Administered 2020-12-01: 1 mg via INTRAVENOUS

## 2020-12-01 MED ORDER — BUPIVACAINE-EPINEPHRINE (PF) 0.25% -1:200000 IJ SOLN
INTRAMUSCULAR | Status: AC
  Filled 2020-12-01: qty 20

## 2020-12-01 MED ORDER — MIDAZOLAM HCL 2 MG/2ML IJ SOLN
INTRAMUSCULAR | Status: AC
  Filled 2020-12-01: qty 2

## 2020-12-01 MED ORDER — FENTANYL CITRATE (PF) 250 MCG/5ML IJ SOLN
INTRAMUSCULAR | Status: AC
  Filled 2020-12-01: qty 5

## 2020-12-01 MED ORDER — PROPOFOL 10 MG/ML IV BOLUS
INTRAVENOUS | Status: DC | PRN
  Administered 2020-12-01: 15 mg via INTRAVENOUS

## 2020-12-01 MED ORDER — CEFAZOLIN SODIUM-DEXTROSE 2-4 GM/100ML-% IV SOLN
2.0000 g | INTRAVENOUS | Status: AC
  Administered 2020-12-01: 2 g via INTRAVENOUS
  Filled 2020-12-01: qty 100

## 2020-12-01 MED ORDER — ONDANSETRON HCL 4 MG/2ML IJ SOLN
INTRAMUSCULAR | Status: DC | PRN
  Administered 2020-12-01: 4 mg via INTRAVENOUS

## 2020-12-01 MED ORDER — LIDOCAINE 2% (20 MG/ML) 5 ML SYRINGE
INTRAMUSCULAR | Status: DC | PRN
  Administered 2020-12-01: 50 mg via INTRAVENOUS

## 2020-12-01 MED ORDER — FENTANYL CITRATE (PF) 250 MCG/5ML IJ SOLN
INTRAMUSCULAR | Status: DC | PRN
  Administered 2020-12-01: 50 ug via INTRAVENOUS

## 2020-12-01 MED ORDER — ORAL CARE MOUTH RINSE: 15.0000 mL | Freq: Once | OROMUCOSAL | Status: AC

## 2020-12-01 MED ORDER — ACETAMINOPHEN 500 MG PO TABS
1000.0000 mg | ORAL_TABLET | ORAL | Status: AC
  Administered 2020-12-01: 1000 mg via ORAL
  Filled 2020-12-01: qty 2

## 2020-12-01 MED ORDER — CHLORHEXIDINE GLUCONATE 0.12 % MT SOLN
15.0000 mL | Freq: Once | OROMUCOSAL | Status: AC
  Administered 2020-12-01: 15 mL via OROMUCOSAL
  Filled 2020-12-01: qty 15

## 2020-12-01 MED ORDER — PROPOFOL 500 MG/50ML IV EMUL
INTRAVENOUS | Status: DC | PRN
  Administered 2020-12-01: 30 ug/kg/min via INTRAVENOUS

## 2020-12-01 SURGICAL SUPPLY — 49 items
ADH SKN CLS APL DERMABOND .7 (GAUZE/BANDAGES/DRESSINGS)
APL PRP STRL LF DISP 70% ISPRP (MISCELLANEOUS) ×1
APL SKNCLS STERI-STRIP NONHPOA (GAUZE/BANDAGES/DRESSINGS)
BALL CTTN LRG ABS STRL LF (GAUZE/BANDAGES/DRESSINGS) ×1
BENZOIN TINCTURE PRP APPL 2/3 (GAUZE/BANDAGES/DRESSINGS) ×1 IMPLANT
CANISTER SUCT 3000ML PPV (MISCELLANEOUS) ×2 IMPLANT
CHLORAPREP W/TINT 26 (MISCELLANEOUS) ×2 IMPLANT
CNTNR URN SCR LID CUP LEK RST (MISCELLANEOUS) IMPLANT
CONT SPEC 4OZ STRL OR WHT (MISCELLANEOUS) ×2
COTTONBALL LRG STERILE PKG (GAUZE/BANDAGES/DRESSINGS) ×1 IMPLANT
COVER SURGICAL LIGHT HANDLE (MISCELLANEOUS) ×2 IMPLANT
COVER WAND RF STERILE (DRAPES) ×2 IMPLANT
DERMABOND ADVANCED (GAUZE/BANDAGES/DRESSINGS)
DERMABOND ADVANCED .7 DNX12 (GAUZE/BANDAGES/DRESSINGS) IMPLANT
DRAPE LAPAROTOMY 100X72 PEDS (DRAPES) ×1 IMPLANT
DRSG HYDROCOLLOID 4X4 (GAUZE/BANDAGES/DRESSINGS) ×1 IMPLANT
DRSG TEGADERM 4X4.75 (GAUZE/BANDAGES/DRESSINGS) IMPLANT
DRSG TELFA 3X8 NADH (GAUZE/BANDAGES/DRESSINGS) ×2 IMPLANT
ELECT REM PT RETURN 9FT ADLT (ELECTROSURGICAL) ×2
ELECTRODE REM PT RTRN 9FT ADLT (ELECTROSURGICAL) ×1 IMPLANT
GAUZE 4X4 16PLY RFD (DISPOSABLE) ×2 IMPLANT
GAUZE SPONGE 4X4 12PLY STRL (GAUZE/BANDAGES/DRESSINGS) ×1 IMPLANT
GLOVE BIO SURGEON STRL SZ 6 (GLOVE) ×2 IMPLANT
GLOVE SURG UNDER LTX SZ6.5 (GLOVE) ×2 IMPLANT
GOWN STRL REUS W/ TWL LRG LVL3 (GOWN DISPOSABLE) ×1 IMPLANT
GOWN STRL REUS W/TWL 2XL LVL3 (GOWN DISPOSABLE) ×2 IMPLANT
GOWN STRL REUS W/TWL LRG LVL3 (GOWN DISPOSABLE) ×2
KIT BASIN OR (CUSTOM PROCEDURE TRAY) ×2 IMPLANT
KIT TURNOVER KIT B (KITS) ×2 IMPLANT
MARKER SKIN DUAL TIP RULER LAB (MISCELLANEOUS) ×2 IMPLANT
NDL HYPO 25GX1X1/2 BEV (NEEDLE) ×2 IMPLANT
NEEDLE HYPO 25GX1X1/2 BEV (NEEDLE) ×2 IMPLANT
NS IRRIG 1000ML POUR BTL (IV SOLUTION) ×2 IMPLANT
PACK GENERAL/GYN (CUSTOM PROCEDURE TRAY) ×2 IMPLANT
PAD ARMBOARD 7.5X6 YLW CONV (MISCELLANEOUS) ×4 IMPLANT
PAD DRESSING TELFA 3X8 NADH (GAUZE/BANDAGES/DRESSINGS) IMPLANT
PENCIL SMOKE EVACUATOR (MISCELLANEOUS) ×2 IMPLANT
SPECIMEN JAR SMALL (MISCELLANEOUS) ×2 IMPLANT
STRIP CLOSURE SKIN 1/2X4 (GAUZE/BANDAGES/DRESSINGS) IMPLANT
SUT ETHILON 2 0 FS 18 (SUTURE) ×1 IMPLANT
SUT MNCRL AB 4-0 PS2 18 (SUTURE) ×2 IMPLANT
SUT SILK 2 0 PERMA HAND 18 BK (SUTURE) IMPLANT
SUT VIC AB 2-0 SH 27 (SUTURE) ×2
SUT VIC AB 2-0 SH 27XBRD (SUTURE) ×1 IMPLANT
SUT VIC AB 3-0 SH 27 (SUTURE) ×2
SUT VIC AB 3-0 SH 27X BRD (SUTURE) ×1 IMPLANT
SYR CONTROL 10ML LL (SYRINGE) ×3 IMPLANT
TAPE PAPER 3X10 WHT MICROPORE (GAUZE/BANDAGES/DRESSINGS) ×1 IMPLANT
TOWEL GREEN STERILE FF (TOWEL DISPOSABLE) ×2 IMPLANT

## 2020-12-01 NOTE — Progress Notes (Signed)
No need to redraw labs DOS, per Dr. Deatra Canter.

## 2020-12-01 NOTE — Transfer of Care (Signed)
Immediate Anesthesia Transfer of Care Note  Patient: [name]  Procedure(s) Performed: RE-EXCISION LEFT POSTERIOR AURICULAR MELANOMA WITH TEMPORARY CLOSURE (Left Ear)  Patient Location: PACU  Anesthesia Type:MAC  Level of Consciousness: patient cooperative and pateint uncooperative  Airway & Oxygen Therapy: Patient Spontanous Breathing  Post-op Assessment: Report given to RN and Post -op Vital signs reviewed and stable  Post vital signs: Reviewed and stable  Last Vitals:  Vitals Value Taken Time  BP 144/77 12/01/20 0855  Temp 36.3 C 12/01/20 0855  Pulse 63 12/01/20 0855  Resp 13 12/01/20 0855  SpO2 99 % 12/01/20 0855  Vitals shown include unvalidated device data.  Last Pain:  Vitals:   12/01/20 0704  TempSrc: Oral  PainSc:          Complications: No complications documented.

## 2020-12-01 NOTE — Op Note (Signed)
PRE-OPERATIVE DIAGNOSIS: melanoma left posterior auricular region  POST-OPERATIVE DIAGNOSIS:  Same  PROCEDURE:  Procedure(s): Reexcision of left posterior auricular melanoma with temporary closure  SURGEON:  Surgeon(s): Stark Klein, MD  ANESTHESIA:   local and MAC  DRAINS: none   LOCAL MEDICATIONS USED:  BUPIVICAINE  and LIDOCAINE   SPECIMEN:  Source of Specimen:  reexcision of left posterior auricular melanoma  DISPOSITION OF SPECIMEN:  PATHOLOGY  COUNTS:  YES  DICTATION: .Dragon Dictation  PLAN OF CARE: Discharge to home after PACU  PATIENT DISPOSITION:  PACU - hemodynamically stable.  FINDINGS:  No gross residual disease  EBL:  min  PROCEDURE:  Patient was identified in holding area and taken to the operating room where he was placed supine on the operating room table.  MAC anesthesia was induced.  The area behind his left ear was prepped and draped in sterile fashion.  The ear was included in the prep.  A timeout was performed according to the surgical safety checklist.  When all was correct, we continued.  The previous incision was identified and local anesthetic was administered under the incision.  An Adson forcep was used to test the anesthetic.  When he was completely numb, I commenced the reexcision.  An elliptical incision was made incorporating the entire prior incision.  He had a small area of necrosis just behind his ear and this was also incorporated into the incision.  The reexcision was marked with sutures in situ.  The cautery was used to take the skin off of the muscle.  The area was measured and inspected for hemostasis. The defect was 3.2 x 5.4 mm.   Once the area was hemostatic, pressure was held for 5 minutes with a clean lap.  There was no blood on the lap and the surgical site was hemostatic.  The region was then dressed with restore temporary wound closure.  A portion was cut to size and secured with a running 3-0 Vicryl.  The skin was cleaned,  dried, and dressed with gauze and tape.  The patient was allowed to emerge from anesthesia and taken to the PACU in stable condition.  Needle, sponge, and instrument count was correct per protocol.

## 2020-12-01 NOTE — Interval H&P Note (Signed)
History and Physical Interval Note:  12/01/2020 7:40 AM  [name]  has presented today for surgery, with the diagnosis of left posterior auricular melanoma.  The various methods of treatment have been discussed with the patient and family. After consideration of risks, benefits and other options for treatment, the patient has consented to  Procedure(s) with comments: RE-EXCISION LEFT POSTERIOR AURICULAR MELANOMA WITH TEMPORARY CLOSURE (Left) - 60 MIN TOTAL as a surgical intervention.  The patient's history has been reviewed, patient examined, no change in status, stable for surgery.  I have reviewed the patient's chart and labs.  Questions were answered to the patient's satisfaction.     Stark Klein

## 2020-12-01 NOTE — Anesthesia Postprocedure Evaluation (Signed)
Anesthesia Post Note  Patient: [name]  Procedure(s) Performed: RE-EXCISION LEFT POSTERIOR AURICULAR MELANOMA WITH TEMPORARY CLOSURE (Left Ear)     Patient location during evaluation: PACU Anesthesia Type: MAC Level of consciousness: awake and alert Pain management: pain level controlled Vital Signs Assessment: post-procedure vital signs reviewed and stable Respiratory status: spontaneous breathing, nonlabored ventilation, respiratory function stable and patient connected to nasal cannula oxygen Cardiovascular status: stable and blood pressure returned to baseline Postop Assessment: no apparent nausea or vomiting Anesthetic complications: no   No complications documented.  Last Vitals:  Vitals:   12/01/20 0900 12/01/20 0920  BP: (!) 167/84   Pulse: (!) 57   Resp: 12   Temp:  (!) 36.3 C  SpO2: 98%     Last Pain:  Vitals:   12/01/20 0855  TempSrc:   PainSc: 0-No pain                 Tiajuana Amass

## 2020-12-01 NOTE — Anesthesia Procedure Notes (Signed)
Procedure Name: MAC Performed by: Janace Litten, CRNA Pre-anesthesia Checklist: Patient identified, Emergency Drugs available, Suction available and Patient being monitored Patient Re-evaluated:Patient Re-evaluated prior to induction Oxygen Delivery Method: Nasal cannula

## 2020-12-01 NOTE — Discharge Instructions (Addendum)
Change dressing on Thursday AM.  Keep covered.      [place] Office Phone Number [phone]   POST OP INSTRUCTIONS  Always review your discharge instruction sheet given to you by the facility where your surgery was performed.  IF YOU HAVE DISABILITY OR FAMILY LEAVE FORMS, YOU MUST BRING THEM TO THE OFFICE FOR PROCESSING.  DO NOT GIVE THEM TO YOUR DOCTOR.  1. A prescription for pain medication may be given to you upon discharge.  Take your pain medication as prescribed, if needed.  If narcotic pain medicine is not needed, then you may take acetaminophen (Tylenol) or ibuprofen (Advil) as needed. 2. Take your usually prescribed medications unless otherwise directed 3. If you need a refill on your pain medication, please contact your pharmacy.  They will contact our office to request authorization.  Prescriptions will not be filled after 5pm or on week-ends. 4. You should eat very light the first 24 hours after surgery, such as soup, crackers, pudding, etc.  Resume your normal diet the day after surgery 5. It is common to experience some constipation if taking pain medication after surgery.  Increasing fluid intake and taking a stool softener will usually help or prevent this problem from occurring.  A mild laxative (Milk of Magnesia or Miralax) should be taken according to package directions if there are no bowel movements after 48 hours. 6. You may shower in 48 hours.  The surgical glue will flake off in 2-3 weeks.   7. ACTIVITIES:  No strenuous activity or heavy lifting for 1 week.   a. You may drive when you no longer are taking prescription pain medication, you can comfortably wear a seatbelt, and you can safely maneuver your car and apply brakes. b. RETURN TO WORK:  __________n/a_______________ Dennis Bast should see your doctor in the office for a follow-up appointment approximately three-four weeks after your surgery.    WHEN TO CALL YOUR DOCTOR: 1. Fever over  101.0 2. Nausea and/or vomiting. 3. Extreme swelling or bruising. 4. Continued bleeding from incision. 5. Increased pain, redness, or drainage from the incision.  The clinic staff is available to answer your questions during regular business hours.  Please don't hesitate to call and ask to speak to one of the nurses for clinical concerns.  If you have a medical emergency, go to the nearest emergency room or call 911.  A surgeon from [facility] is always on call at the hospital.  For further questions, please visit [email or web address]

## 2020-12-02 ENCOUNTER — Other Ambulatory Visit: Payer: Self-pay

## 2020-12-02 ENCOUNTER — Encounter (HOSPITAL_COMMUNITY): Payer: Self-pay | Admitting: General Surgery

## 2020-12-02 ENCOUNTER — Other Ambulatory Visit: Payer: Self-pay | Admitting: Family Medicine

## 2020-12-02 DIAGNOSIS — E1151 Type 2 diabetes mellitus with diabetic peripheral angiopathy without gangrene: Secondary | ICD-10-CM

## 2020-12-02 DIAGNOSIS — IMO0002 Reserved for concepts with insufficient information to code with codable children: Secondary | ICD-10-CM

## 2020-12-02 LAB — SURGICAL PATHOLOGY

## 2020-12-04 ENCOUNTER — Encounter (HOSPITAL_BASED_OUTPATIENT_CLINIC_OR_DEPARTMENT_OTHER)
Admission: RE | Admit: 2020-12-04 | Discharge: 2020-12-04 | Disposition: A | Discharge status: Home / Self Care | Payer: Medicare Other | Source: Ambulatory Visit | Attending: Plastic Surgery | Admitting: Plastic Surgery

## 2020-12-04 ENCOUNTER — Other Ambulatory Visit (HOSPITAL_COMMUNITY)
Admission: RE | Admit: 2020-12-04 | Discharge: 2020-12-04 | Disposition: A | Discharge status: Home / Self Care | Payer: Medicare Other | Source: Ambulatory Visit | Attending: Plastic Surgery | Admitting: Plastic Surgery

## 2020-12-04 DIAGNOSIS — Z01812 Encounter for preprocedural laboratory examination: Secondary | ICD-10-CM | POA: Diagnosis not present

## 2020-12-04 DIAGNOSIS — C4330 Malignant melanoma of unspecified part of face: Secondary | ICD-10-CM | POA: Diagnosis not present

## 2020-12-04 DIAGNOSIS — Z20822 Contact with and (suspected) exposure to covid-19: Secondary | ICD-10-CM | POA: Insufficient documentation

## 2020-12-04 LAB — BASIC METABOLIC PANEL
Anion gap: 7 (ref 5–15)
BUN: 22 mg/dL (ref 8–23)
CO2: 23 mmol/L (ref 22–32)
Calcium: 8.9 mg/dL (ref 8.9–10.3)
Chloride: 110 mmol/L (ref 98–111)
Creatinine, Ser: 1.33 mg/dL — ABNORMAL HIGH (ref 0.61–1.24)
GFR, Estimated: 55 mL/min — ABNORMAL LOW (ref >60)
Glucose, Bld: 117 mg/dL — ABNORMAL HIGH (ref 70–99)
Potassium: 4.6 mmol/L (ref 3.5–5.1)
Sodium: 140 mmol/L (ref 135–145)

## 2020-12-04 LAB — SARS CORONAVIRUS 2 (TAT 6-24 HRS): SARS Coronavirus 2: NEGATIVE

## 2020-12-04 NOTE — Progress Notes (Signed)

## 2020-12-04 NOTE — H&P (Signed)
Subjective:     Patient ID: [name] is a 75 y.o. male.  Follow-up  Here for follow up discussion closure wound following repeat resection melanoma left post auricular melanoma. Presented following routine skin examination with Dermatology. Left post auricular lesion noted, shave biopsy showed a melanoma 0.7 mm, no mitoses, no LVI, no perineural invasion, negative margins, no ulceration or regression, and no microsatellitosis. Underwent resection 11/17/20 with final pathology 0.4 mm residual melanoma invasive, MIS involves 7-9 o clock margin, scattered atypical melanocytes at 6 o clock margin, 0 mitoses, no LVI.  Notes post operatively exprienced "cup" of bloody drainage and experienced hole in area.   Underwent re resection this week with final pathology residual MIS margins free.  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  Prior melanoma right jawline excised at [place], prior [facility] excision. Followed by Dr. Pearline Cables [facility].  Patient is retired. Accompanied by wife.  Review of Systems     Objective:   Physical Exam Cardiovascular:     Rate and Rhythm: Normal rate and regular rhythm.     Heart sounds: Normal heart sounds.  Pulmonary:     Effort: Pulmonary effort is normal.     Breath sounds: Normal breath sounds.    HEENT: open wound left post auricular 4.3 x 2.5 cm Restore dressing in place faint mastoid scar, laxity neck present  MS: medial upper arms with elastosis no lesions of concern laxity of skin     Assessment:     Melanoma left post auricular s/p WLE with positive margin S/p reexcision left post auricular with clear margins.    Plan:     Reviewed pathology with patient and copy provided.   Reviewed options for wound including healing by secondary intention, skin graft, or local flap. Counseled that I anticipate 6-12 weeks for complete healing by  secondary intention. Skin graft offers shorter time to complete healing if good take, risks prolonged healed if poor take graft, donor site healing. Both graft and healing on own would leave same patch like appearance long term. Discussed bolster dressing for week over graft. He has laxity neck that could be utilized for local flap. One problem with this approach would be transfer of beard/hair bearing skin to posterior ear.   Plan skin graft, plan left upper arm donor site.  Patient not taking pain medications and declines Rx.

## 2020-12-06 ENCOUNTER — Other Ambulatory Visit: Payer: Self-pay | Admitting: Family Medicine

## 2020-12-06 DIAGNOSIS — E785 Hyperlipidemia, unspecified: Secondary | ICD-10-CM

## 2020-12-08 ENCOUNTER — Encounter (HOSPITAL_BASED_OUTPATIENT_CLINIC_OR_DEPARTMENT_OTHER): Payer: Self-pay | Admitting: Plastic Surgery

## 2020-12-08 ENCOUNTER — Encounter (HOSPITAL_BASED_OUTPATIENT_CLINIC_OR_DEPARTMENT_OTHER)
Admission: RE | Disposition: A | Discharge status: Home / Self Care | Payer: Self-pay | Source: Ambulatory Visit | Attending: Plastic Surgery

## 2020-12-08 ENCOUNTER — Ambulatory Visit (HOSPITAL_BASED_OUTPATIENT_CLINIC_OR_DEPARTMENT_OTHER)
Admission: RE | Admit: 2020-12-08 | Discharge: 2020-12-08 | Disposition: A | Discharge status: Home / Self Care | Payer: Medicare Other | Source: Ambulatory Visit | Attending: Plastic Surgery | Admitting: Plastic Surgery

## 2020-12-08 ENCOUNTER — Ambulatory Visit (HOSPITAL_BASED_OUTPATIENT_CLINIC_OR_DEPARTMENT_OTHER): Payer: Medicare Other | Admitting: Anesthesiology

## 2020-12-08 DIAGNOSIS — Z85820 Personal history of malignant melanoma of skin: Secondary | ICD-10-CM | POA: Insufficient documentation

## 2020-12-08 DIAGNOSIS — N401 Enlarged prostate with lower urinary tract symptoms: Secondary | ICD-10-CM | POA: Diagnosis not present

## 2020-12-08 DIAGNOSIS — C434 Malignant melanoma of scalp and neck: Secondary | ICD-10-CM | POA: Diagnosis not present

## 2020-12-08 DIAGNOSIS — Z428 Encounter for other plastic and reconstructive surgery following medical procedure or healed injury: Secondary | ICD-10-CM | POA: Insufficient documentation

## 2020-12-08 DIAGNOSIS — R338 Other retention of urine: Secondary | ICD-10-CM | POA: Diagnosis not present

## 2020-12-08 DIAGNOSIS — I25119 Atherosclerotic heart disease of native coronary artery with unspecified angina pectoris: Secondary | ICD-10-CM | POA: Diagnosis not present

## 2020-12-08 DIAGNOSIS — S0100XA Unspecified open wound of scalp, initial encounter: Secondary | ICD-10-CM | POA: Diagnosis not present

## 2020-12-08 DIAGNOSIS — I10 Essential (primary) hypertension: Secondary | ICD-10-CM | POA: Diagnosis not present

## 2020-12-08 HISTORY — PX: SKIN FULL THICKNESS GRAFT: SHX442

## 2020-12-08 HISTORY — PX: DEBRIDEMENT AND CLOSURE WOUND: SHX5614

## 2020-12-08 SURGERY — DEBRIDEMENT, WOUND, WITH CLOSURE
Anesthesia: General | Site: Scalp

## 2020-12-08 MED ORDER — ONDANSETRON HCL 4 MG/2ML IJ SOLN: 4.0000 mg | Freq: Once | INTRAMUSCULAR | Status: DC | PRN

## 2020-12-08 MED ORDER — FENTANYL CITRATE (PF) 100 MCG/2ML IJ SOLN
INTRAMUSCULAR | Status: AC
  Filled 2020-12-08: qty 2

## 2020-12-08 MED ORDER — ONDANSETRON HCL 4 MG/2ML IJ SOLN
INTRAMUSCULAR | Status: DC | PRN
  Administered 2020-12-08: 4 mg via INTRAVENOUS

## 2020-12-08 MED ORDER — LIDOCAINE HCL (CARDIAC) PF 100 MG/5ML IV SOSY
PREFILLED_SYRINGE | INTRAVENOUS | Status: DC | PRN
  Administered 2020-12-08: 60 mg via INTRAVENOUS

## 2020-12-08 MED ORDER — ACETAMINOPHEN 10 MG/ML IV SOLN: 1000.0000 mg | Freq: Once | INTRAVENOUS | Status: DC | PRN

## 2020-12-08 MED ORDER — EPHEDRINE SULFATE 50 MG/ML IJ SOLN
INTRAMUSCULAR | Status: DC | PRN
  Administered 2020-12-08 (×2): 10 mg via INTRAVENOUS
  Administered 2020-12-08 (×2): 15 mg via INTRAVENOUS

## 2020-12-08 MED ORDER — LACTATED RINGERS IV SOLN: INTRAVENOUS | Status: DC

## 2020-12-08 MED ORDER — DEXAMETHASONE SODIUM PHOSPHATE 4 MG/ML IJ SOLN
INTRAMUSCULAR | Status: DC | PRN
  Administered 2020-12-08: 4 mg via INTRAVENOUS

## 2020-12-08 MED ORDER — BACITRACIN ZINC 500 UNIT/GM EX OINT
TOPICAL_OINTMENT | CUTANEOUS | Status: AC
  Filled 2020-12-08: qty 0.9

## 2020-12-08 MED ORDER — LIDOCAINE-EPINEPHRINE 1 %-1:100000 IJ SOLN
INTRAMUSCULAR | Status: DC | PRN
  Administered 2020-12-08: 9 mL

## 2020-12-08 MED ORDER — PROPOFOL 10 MG/ML IV BOLUS
INTRAVENOUS | Status: DC | PRN
  Administered 2020-12-08: 150 mg via INTRAVENOUS

## 2020-12-08 MED ORDER — BACITRACIN ZINC 500 UNIT/GM EX OINT
TOPICAL_OINTMENT | CUTANEOUS | Status: AC
  Filled 2020-12-08: qty 28.35

## 2020-12-08 MED ORDER — FENTANYL CITRATE (PF) 100 MCG/2ML IJ SOLN
INTRAMUSCULAR | Status: DC | PRN
  Administered 2020-12-08 (×2): 50 ug via INTRAVENOUS

## 2020-12-08 MED ORDER — FENTANYL CITRATE (PF) 100 MCG/2ML IJ SOLN: 25.0000 ug | INTRAMUSCULAR | Status: DC | PRN

## 2020-12-08 MED ORDER — CEFAZOLIN SODIUM-DEXTROSE 2-4 GM/100ML-% IV SOLN
2.0000 g | INTRAVENOUS | Status: AC
  Administered 2020-12-08: 2 g via INTRAVENOUS

## 2020-12-08 MED ORDER — PHENYLEPHRINE HCL (PRESSORS) 10 MG/ML IV SOLN
INTRAVENOUS | Status: DC | PRN
  Administered 2020-12-08 (×2): 40 ug via INTRAVENOUS

## 2020-12-08 SURGICAL SUPPLY — 67 items
ADH SKN CLS APL DERMABOND .7 (GAUZE/BANDAGES/DRESSINGS)
APL SKNCLS STERI-STRIP NONHPOA (GAUZE/BANDAGES/DRESSINGS)
BENZOIN TINCTURE PRP APPL 2/3 (GAUZE/BANDAGES/DRESSINGS) IMPLANT
BLADE CLIPPER SURG (BLADE) IMPLANT
BLADE SURG 10 STRL SS (BLADE) ×1 IMPLANT
BLADE SURG 15 STRL LF DISP TIS (BLADE) ×2 IMPLANT
BLADE SURG 15 STRL SS (BLADE) ×3
BRUSH SCRUB EZ PLAIN DRY (MISCELLANEOUS) ×1 IMPLANT
CANISTER SUCT 1200ML W/VALVE (MISCELLANEOUS) IMPLANT
CLEANER CAUTERY TIP 5X5 PAD (MISCELLANEOUS) IMPLANT
COVER BACK TABLE 60X90IN (DRAPES) ×3 IMPLANT
COVER MAYO STAND STRL (DRAPES) ×3 IMPLANT
COVER WAND RF STERILE (DRAPES) IMPLANT
DERMABOND ADVANCED (GAUZE/BANDAGES/DRESSINGS)
DERMABOND ADVANCED .7 DNX12 (GAUZE/BANDAGES/DRESSINGS) IMPLANT
DRAPE LAPAROSCOPIC ABDOMINAL (DRAPES) IMPLANT
DRAPE U-SHAPE 76X120 STRL (DRAPES) ×3 IMPLANT
DRAPE UTILITY XL STRL (DRAPES) ×3 IMPLANT
ELECT COATED BLADE 2.86 ST (ELECTRODE) IMPLANT
ELECT NDL BLADE 2-5/6 (NEEDLE) ×2 IMPLANT
ELECT NEEDLE BLADE 2-5/6 (NEEDLE) ×3 IMPLANT
ELECT REM PT RETURN 9FT ADLT (ELECTROSURGICAL) ×3
ELECT REM PT RETURN 9FT PED (ELECTROSURGICAL)
ELECTRODE REM PT RETRN 9FT PED (ELECTROSURGICAL) IMPLANT
ELECTRODE REM PT RTRN 9FT ADLT (ELECTROSURGICAL) IMPLANT
GAUZE 4X4 16PLY RFD (DISPOSABLE) ×1 IMPLANT
GAUZE SPONGE 4X4 12PLY STRL LF (GAUZE/BANDAGES/DRESSINGS) ×1 IMPLANT
GAUZE XEROFORM 1X8 LF (GAUZE/BANDAGES/DRESSINGS) IMPLANT
GLOVE EXAM NITRILE PF LG BLUE (GLOVE) IMPLANT
GLOVE SURG ENC MOIS LTX SZ6.5 (GLOVE) ×1 IMPLANT
GLOVE SURG HYDRASOFT LTX SZ5.5 (GLOVE) ×6 IMPLANT
GLOVE SURG UNDER POLY LF SZ6.5 (GLOVE) ×1 IMPLANT
GOWN STRL REUS W/ TWL LRG LVL3 (GOWN DISPOSABLE) ×4 IMPLANT
GOWN STRL REUS W/TWL LRG LVL3 (GOWN DISPOSABLE) ×6
NDL HYPO 30GX1 BEV (NEEDLE) IMPLANT
NDL PRECISIONGLIDE 27X1.5 (NEEDLE) ×2 IMPLANT
NEEDLE HYPO 30GX1 BEV (NEEDLE) IMPLANT
NEEDLE PRECISIONGLIDE 27X1.5 (NEEDLE) ×3 IMPLANT
NS IRRIG 1000ML POUR BTL (IV SOLUTION) ×1 IMPLANT
PACK BASIN DAY SURGERY FS (CUSTOM PROCEDURE TRAY) ×3 IMPLANT
PAD CLEANER CAUTERY TIP 5X5 (MISCELLANEOUS)
PENCIL SMOKE EVACUATOR (MISCELLANEOUS) ×1 IMPLANT
SHEET MEDIUM DRAPE 40X70 STRL (DRAPES) ×1 IMPLANT
SPONGE GAUZE 2X2 8PLY STRL LF (GAUZE/BANDAGES/DRESSINGS) IMPLANT
SPONGE LAP 18X18 RF (DISPOSABLE) IMPLANT
STAPLER VISISTAT 35W (STAPLE) ×1 IMPLANT
STRIP CLOSURE SKIN 1/2X4 (GAUZE/BANDAGES/DRESSINGS) ×1 IMPLANT
SUCTION FRAZIER HANDLE 10FR (MISCELLANEOUS)
SUCTION TUBE FRAZIER 10FR DISP (MISCELLANEOUS) IMPLANT
SUT CHROMIC 4 0 P 3 18 (SUTURE) ×1 IMPLANT
SUT CHROMIC 4 0 PS 2 18 (SUTURE) IMPLANT
SUT ETHILON 4 0 PS 2 18 (SUTURE) IMPLANT
SUT ETHILON 5 0 P 3 18 (SUTURE)
SUT MNCRL AB 4-0 PS2 18 (SUTURE) ×1 IMPLANT
SUT MON AB 5-0 P3 18 (SUTURE) IMPLANT
SUT NYLON ETHILON 5-0 P-3 1X18 (SUTURE) IMPLANT
SUT PLAIN 5 0 P 3 18 (SUTURE) ×1 IMPLANT
SUT SILK 4 0 P 3 (SUTURE) ×1 IMPLANT
SUT VICRYL 4-0 PS2 18IN ABS (SUTURE) IMPLANT
SYR BULB EAR ULCER 3OZ GRN STR (SYRINGE) ×1 IMPLANT
SYR BULB IRRIG 60ML STRL (SYRINGE) IMPLANT
SYR CONTROL 10ML LL (SYRINGE) ×3 IMPLANT
TOWEL GREEN STERILE FF (TOWEL DISPOSABLE) ×3 IMPLANT
TRAY DSU PREP LF (CUSTOM PROCEDURE TRAY) ×3 IMPLANT
TUBE CONNECTING 20X1/4 (TUBING) IMPLANT
UNDERPAD 30X36 HEAVY ABSORB (UNDERPADS AND DIAPERS) ×3 IMPLANT
YANKAUER SUCT BULB TIP NO VENT (SUCTIONS) IMPLANT

## 2020-12-08 NOTE — Discharge Instructions (Signed)

## 2020-12-08 NOTE — Op Note (Signed)
Operative Note   DATE OF OPERATION: 3.1.22  LOCATION: [facility]-outpatient  SURGICAL DIVISION: Plastic Surgery  PREOPERATIVE DIAGNOSES:  1. Open wound scalp 2. Melanoma scalp  POSTOPERATIVE DIAGNOSES:  same  PROCEDURE:  Full thickness skin graft to scalp 18cm2  SURGEON: Irene Limbo MD MBA  ASSISTANT: none  ANESTHESIA:  General.   EBL: 15 ml  COMPLICATIONS: None immediate.   INDICATIONS FOR PROCEDURE:  The patient, [name], is a 75 y.o. male born on [birth year], is here for coverage open wound scalp following resection melanoma.   FINDINGS: Open wound post auricular scalp largest dimensions 5 x 4 cm, base of wound with exposed fascia, no exposed bone and approximately 50% granulated base.  DESCRIPTION OF PROCEDURE:  The patient's operative site was marked with the patient in the preoperative area. The patient was taken to the operating room. SCDs were placed and IV antibiotics were given. The patient's operative site was prepped and draped in a sterile fashion. A time out was performed and all information was confirmed to be correct. Local anesthetic infiltrated surrounding wound and left upper arm donor site. Curettage performed over wound in preparation of grafting. Elliptical skin graft designed over left medial upper arm. Sharp excision of skin completed. Closure of donor site completed with interrupted 4-0 monocryl in dermis, 4-0 monocryl subcuticular skin closure. Steri strips and dry dressing applied.  Graft prepared by removal subcutaneous fat to dermis. Skin graft inset to wound with 5-0 plain gut and 4-0 chromic suture. Graft perforated. Additional plication sutures placed from graft to wound base. Adaptic, antibiotic ointment, and sterile sponge applied as bolster with staples and 4-0 silk.  The patient was allowed to wake from anesthesia, extubated and taken to the recovery room in satisfactory condition.   SPECIMENS: none  DRAINS: none

## 2020-12-08 NOTE — Anesthesia Postprocedure Evaluation (Signed)
Anesthesia Post Note  Patient: [name]  Procedure(s) Performed: CLOSURE LEFT POST AURICULAR SCALP WOUND (N/A Scalp) Full thickness skin graft from left upper arm (Left Scalp)     Patient location during evaluation: PACU Anesthesia Type: General Level of consciousness: awake Pain management: pain level controlled Vital Signs Assessment: post-procedure vital signs reviewed and stable Respiratory status: spontaneous breathing, nonlabored ventilation, respiratory function stable and patient connected to nasal cannula oxygen Cardiovascular status: blood pressure returned to baseline and stable Postop Assessment: no apparent nausea or vomiting Anesthetic complications: no   No complications documented.  Last Vitals:  Vitals:   12/08/20 1345 12/08/20 1452  BP: (!) 192/104 (!) 208/97  Pulse: 86 78  Resp: 17 18  Temp:  36.6 C  SpO2: 99% 98%    Last Pain:  Vitals:   12/08/20 1452  TempSrc:   PainSc: 0-No pain                 Benjermin Korber P Daison Braxton

## 2020-12-08 NOTE — Anesthesia Preprocedure Evaluation (Addendum)
Anesthesia Evaluation  Patient identified by MRN, date of birth, ID band Patient awake    Reviewed: Allergy & Precautions, NPO status , Patient's Chart, lab work & pertinent test results  Airway Mallampati: II  TM Distance: >3 FB Neck ROM: Full    Dental no notable dental hx.    Pulmonary former smoker,    Pulmonary exam normal breath sounds clear to auscultation       Cardiovascular hypertension, Pt. on medications + CAD and + Cardiac Stents  Normal cardiovascular exam Rhythm:Regular Rate:Normal  ECG: rate 60   Neuro/Psych  Headaches, PSYCHIATRIC DISORDERS Anxiety Depression TIA   GI/Hepatic negative GI ROS, Neg liver ROS,   Endo/Other  diabetesHypothyroidism   Renal/GU negative Renal ROS     Musculoskeletal  (+) Arthritis ,   Abdominal   Peds  Hematology HLD   Anesthesia Other Findings open wound scalp, melanoma scalp  Reproductive/Obstetrics                            Anesthesia Physical Anesthesia Plan  ASA: III  Anesthesia Plan: General   Post-op Pain Management:    Induction: Intravenous  PONV Risk Score and Plan: 2 and Ondansetron, Dexamethasone and Treatment may vary due to age or medical condition  Airway Management Planned: LMA  Additional Equipment:   Intra-op Plan:   Post-operative Plan: Extubation in OR  Informed Consent: I have reviewed the patients History and Physical, chart, labs and discussed the procedure including the risks, benefits and alternatives for the proposed anesthesia with the patient or authorized representative who has indicated his/her understanding and acceptance.     Dental advisory given  Plan Discussed with: CRNA  Anesthesia Plan Comments: (Patient instructed to remove hearing aids)       Anesthesia Quick Evaluation

## 2020-12-08 NOTE — Anesthesia Procedure Notes (Signed)
Procedure Name: LMA Insertion Date/Time: 12/08/2020 12:08 PM Performed by: Willa Frater, CRNA Pre-anesthesia Checklist: Patient identified, Emergency Drugs available, Suction available and Patient being monitored Patient Re-evaluated:Patient Re-evaluated prior to induction Oxygen Delivery Method: Circle system utilized Preoxygenation: Pre-oxygenation with 100% oxygen Induction Type: IV induction Ventilation: Mask ventilation without difficulty LMA: LMA inserted LMA Size: 5.0 Number of attempts: 1 Airway Equipment and Method: Bite block Placement Confirmation: positive ETCO2 Tube secured with: Tape Dental Injury: Teeth and Oropharynx as per pre-operative assessment

## 2020-12-08 NOTE — Interval H&P Note (Signed)
History and Physical Interval Note:  12/08/2020 11:17 AM  [name]  has presented today for surgery, with the diagnosis of Open wound scalp, melanoma scalp.  The various methods of treatment have been discussed with the patient and family. After consideration of risks, benefits and other options for treatment, the patient has consented to  Procedure(s): CLOSURE LEFT POST AURICULAR SCALP WOUND (N/A) WITH SKIN GRAFT VS LOCAL FLAP CLOSURE (N/A) as a surgical intervention.  The patient's history has been reviewed, patient examined, no change in status, stable for surgery.  I have reviewed the patient's chart and labs.  Questions were answered to the patient's satisfaction.     [name]

## 2020-12-08 NOTE — Transfer of Care (Signed)
Immediate Anesthesia Transfer of Care Note  Patient: [name]  Procedure(s) Performed: CLOSURE LEFT POST AURICULAR SCALP WOUND (N/A Scalp) Full thickness skin graft from left upper arm (Left Scalp)  Patient Location: PACU  Anesthesia Type:General  Level of Consciousness: awake, alert  and oriented  Airway & Oxygen Therapy: Patient Spontanous Breathing and Patient connected to face mask oxygen  Post-op Assessment: Report given to RN and Post -op Vital signs reviewed and stable  Post vital signs: Reviewed and stable  Last Vitals:  Vitals Value Taken Time  BP    Temp    Pulse 86 12/08/20 1323  Resp 17 12/08/20 1323  SpO2 98 % 12/08/20 1323  Vitals shown include unvalidated device data.  Last Pain:  Vitals:   12/08/20 1022  TempSrc: Oral  PainSc: 0-No pain      Patients Stated Pain Goal: 1 (23/34/35 6861)  Complications: No complications documented.

## 2020-12-09 ENCOUNTER — Encounter (HOSPITAL_BASED_OUTPATIENT_CLINIC_OR_DEPARTMENT_OTHER): Payer: Self-pay | Admitting: Plastic Surgery

## 2020-12-09 NOTE — Addendum Note (Signed)
Addendum  created 12/09/20 1213 by Lavonia Dana, CRNA   Charge Capture section accepted

## 2020-12-14 ENCOUNTER — Encounter: Payer: Self-pay | Admitting: Plastic Surgery

## 2020-12-16 DIAGNOSIS — R338 Other retention of urine: Secondary | ICD-10-CM | POA: Diagnosis not present

## 2020-12-24 DIAGNOSIS — R338 Other retention of urine: Secondary | ICD-10-CM | POA: Diagnosis not present

## 2020-12-24 DIAGNOSIS — N3000 Acute cystitis without hematuria: Secondary | ICD-10-CM | POA: Diagnosis not present

## 2020-12-25 ENCOUNTER — Ambulatory Visit: Payer: Medicare Other | Admitting: Psychology

## 2020-12-25 ENCOUNTER — Ambulatory Visit (INDEPENDENT_AMBULATORY_CARE_PROVIDER_SITE_OTHER): Payer: Medicare Other | Admitting: Psychology

## 2020-12-25 ENCOUNTER — Encounter: Payer: Self-pay | Admitting: Psychology

## 2020-12-25 ENCOUNTER — Other Ambulatory Visit: Payer: Self-pay

## 2020-12-25 DIAGNOSIS — C4491 Basal cell carcinoma of skin, unspecified: Secondary | ICD-10-CM | POA: Insufficient documentation

## 2020-12-25 DIAGNOSIS — Z9861 Coronary angioplasty status: Secondary | ICD-10-CM | POA: Diagnosis not present

## 2020-12-25 DIAGNOSIS — E119 Type 2 diabetes mellitus without complications: Secondary | ICD-10-CM | POA: Diagnosis not present

## 2020-12-25 DIAGNOSIS — F0670 Mild neurocognitive disorder due to known physiological condition without behavioral disturbance: Secondary | ICD-10-CM

## 2020-12-25 DIAGNOSIS — G3184 Mild cognitive impairment, so stated: Secondary | ICD-10-CM | POA: Diagnosis not present

## 2020-12-25 DIAGNOSIS — I2510 Atherosclerotic heart disease of native coronary artery without angina pectoris: Secondary | ICD-10-CM | POA: Diagnosis not present

## 2020-12-25 DIAGNOSIS — R4189 Other symptoms and signs involving cognitive functions and awareness: Secondary | ICD-10-CM

## 2020-12-25 HISTORY — DX: Mild neurocognitive disorder due to known physiological condition without behavioral disturbance: F06.70

## 2020-12-25 HISTORY — DX: Mild cognitive impairment, so stated: G31.84

## 2020-12-25 NOTE — Progress Notes (Signed)
   Psychometrician Note   Cognitive testing was administered to [name] by Wallace Keller, B.S. (psychometrist) under the supervision of Dr. Newman Nickels, Ph.D., licensed psychologist on 12/25/20. Mr. Schmaltz did not appear overtly distressed by the testing session per behavioral observation or responses across self-report questionnaires. Rest breaks were offered.    The battery of tests administered was selected by Dr. Newman Nickels, Ph.D. with consideration to Mr. Cassone current level of functioning, the nature of his symptoms, emotional and behavioral responses during interview, level of literacy, observed level of motivation/effort, and the nature of the referral question. This battery was communicated to the psychometrist. Communication between Dr. Newman Nickels, Ph.D. and the psychometrist was ongoing throughout the evaluation and Dr. Newman Nickels, Ph.D. was immediately accessible at all times. Dr. Newman Nickels, Ph.D. provided supervision to the psychometrist on the date of this service to the extent necessary to assure the quality of all services provided.    IASON DANH will return within approximately 1-2 weeks for an interactive feedback session with Dr. Milbert Coulter at which time his test performances, clinical impressions, and treatment recommendations will be reviewed in detail. Mr. Litterio understands he can contact our office should he require our assistance before this time.  A total of 135 minutes of billable time were spent face-to-face with Mr. Luikart by the psychometrist. This includes both test administration and scoring time. Billing for these services is reflected in the clinical report generated by Dr. Newman Nickels, Ph.D.  This note reflects time spent with the psychometrician and does not include test scores or any clinical interpretations made by Dr. Milbert Coulter. The full report will follow in a separate note.

## 2020-12-25 NOTE — Progress Notes (Signed)
NEUROPSYCHOLOGICAL EVALUATION [place]. [facility] Department of Neurology  Date of Evaluation: December 25, 2020  Reason for Referral:   [name] is a 75 y.o. right-handed Caucasian male referred by Roma Schanz, D.O., to characterize his current cognitive functioning and assist with diagnostic clarity and treatment planning in the context of subjective cognitive decline.   Assessment and Plan:   Clinical Impression(s): [name] pattern of performance is suggestive of variability across executive functioning and isolated impairments across verbal fluency, retrieval of a previously learned list of words, and visual discrimination. However, other assessments of language and visuospatial abilities were appropriate, as were performances across shape and story-based memory measures. [name] did not exhibit consistent weakness across any cognitive domain. Performance was also appropriate across processing speed, attention/concentration, safety/judgment, receptive language, and confrontation naming. [name] denied difficulties completing instrumental activities of daily living (ADLs) independently. As such, given evidence for cognitive dysfunction described above, he meets criteria for a Mild Neurocognitive Disorder ("mild cognitive impairment"). However, the mild nature of this diagnosis should be emphasized at the present time.   Given the somewhat isolated nature of impairments, the etiology for mild dysfunction is unclear. [name] has numerous cardiovascular ailments in his medical history and neuroimaging which suggested mild small vessel ischemic changes. This increases the likelihood of a vascular contribution to his current presentation. He also reported being prescribed Topamax/topiramate, noting that subjective memory dysfunction was first observed after he started taking this medication. Topamax has well known cognitive side effects which can worsen brain fog  and memory functioning. It is possible that some dysfunction is due to medication side effects. Migraine headaches, as well as reported longstanding attentional dysregulation, could further be impacting cognitive performances. Despite his inability to recall any words from a previously learned list after a delay, he did perform well on a yes/no recognition trial, as well as across story and shape-based memory tasks as a whole. As such, I do not see compelling evidence to warrant concerns surrounding Alzheimer's disease at the present time. Behaviorally, he also does not exhibit concerning symptoms for Lewy body dementia or frontotemporal dementia at the present time. Continued medical monitoring will be important moving forward.    Recommendations: Given isolated impairments, I do feel that it would be prudent to re-evaluate [name] in the future. As such, a repeat neuropsychological evaluation in 24 months (or sooner if functional decline is noted) is recommended to assess the trajectory of future cognitive decline should it occur. This will also aid in future efforts towards improved diagnostic clarity.  [name] could discuss with his medical doctors about alternative medications to address migraine headaches. However, it may be that the significant benefits [name] receives from topiramate outweigh the likely mild cognitive side effect profile. Only he can make this decision.  [name] is encouraged to attend to lifestyle factors for brain health (e.g., regular physical exercise, good nutrition habits, regular participation in cognitively-stimulating activities, and general stress management techniques), which are likely to have benefits for both emotional adjustment and cognition. Optimal control of vascular risk factors (including safe cardiovascular exercise and adherence to dietary recommendations) is encouraged. Continued participation in activities which provide mental stimulation and social  interaction is also recommended.   Memory can be improved using internal strategies such as rehearsal, repetition, chunking, mnemonics, association, and imagery. External strategies such as written notes in a consistently used memory journal, visual and nonverbal auditory cues such as a calendar on  the refrigerator or appointments with alarm, such as on a cell phone, can also help maximize recall.    Because he shows better recall for structured information, he will likely understand and retain new information better if it is presented to him in a meaningful or well-organized manner at the outset, such as grouping items into meaningful categories or presenting information in an outlined, bulleted, or story format.   To address problems with fluctuating attention and executive dysfunction, he may wish to consider:   -Avoiding external distractions when needing to concentrate   -Limiting exposure to fast paced environments with multiple sensory demands   -Writing down complicated information and using checklists   -Attempting and completing one task at a time (i.e., no multi-tasking)   -Verbalizing aloud each step of a task to maintain focus   -Reducing the amount of information considered at one time  Review of Records:   [name] was seen by [facility] Metta Clines, D.O.) on 10/13/2020 to follow-up for migraine headaches and essential tremor. Migraines were said to be well controlled since starting topiramate and he may go several months without any symptoms. Tremors were also said to be stable.   [name] was diagnosed with malignant melanoma in January 2022. A shave biopsy on a spot found behind his left ear showed a melanoma 0.7 mm thick. This had no mitoses, no LVI, no perineural invasion, negative margins, no ulceration or regression, and no microsatellitosis. He had not had melanoma before, but has had a hemithyroidectomy and a left mastoid resection in the past. Since this diagnosis,  [name] has undergone several surgical procedures with the aim of eliminating cancerous skin cells. He most recently underwent an excision on 12/01/2020 and was again seen on 12/08/2020 for a skin graft.   [name] was seen by his PCP Roma Schanz, D.O.) on 11/10/2020 for follow-up. At this time, [name] reported worsening short-term memory. Dr. Etter Sjogren noted that he performed well on a brief cognitive screening instrument (MMSE) during a previous wellness exam. However, [name] reported continued concerns. No further details were provided. Ultimately, [name] was referred for a comprehensive neuropsychological evaluation to characterize his cognitive abilities and to assist with diagnostic clarity and treatment planning.   Brain MRI on 01/02/2020 revealed minimal chronic small vessel ischemic changes within the cerebral white matter, said to be stable relative to a prior MRI in 2017.   Past Medical History:  Diagnosis Date  . Abdominal bruit 07/22/2010  . Acute sinusitis 03/26/2015  . Acute suppr otitis media w spon rupt ear drum, recur, r ear 04/06/2017  . Angina pectoris 12/11/2008  . Benign essential tremor 08/29/2017  . Bilateral sensorineural hearing loss 11/30/2015  . CAD S/P percutaneous coronary angioplasty 12/11/2008   LAD PCI '03,  Cath 2010 showed 40% LAD- medical Rx Myoview March 2020 negative for ischemia but positive for ST depression- Imdur added.  Formatting of this note might be different from the original. LAD PCI '03,  Cath 2010 showed 40% LAD- medical Rx Myoview March 2020 negative for ischemia but positive for ST depression- Imdur added.  Last Assessment & Plan:  Check labs con't meds Followed by car  . Cataract   . Chronic mastoiditis 02/11/2020  . Chronic pansinusitis 09/21/2016  . Chronic right shoulder pain 10/30/2019  . Chronic serous otitis media 04/19/2018  . Chronic tubotympanic suppurative otitis media, bilateral   . 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  Drug-induced neutropenia 11/10/2020  . Dysphagia 10/18/2018  .  Essential hypertension 12/11/2008   Echo May 2020- EF 60-65% with mild LVH   Formatting of this note might be different from the original. Echo May 2020- EF 60-65% with mild LVH  Last Assessment & Plan:  Well controlled, no changes to meds. Encouraged heart healthy diet such as the DASH diet and exercise as tolerated.  . Eustachian tube dysfunction, bilateral 11/30/2015  . History of colonic polyps 07/12/2010  . History of GI bleed 04/23/2020  . History of left mastoidectomy 02/20/2020  . History of placement of ear tubes   . Hyperlipidemia associated with type 2 diabetes mellitus 07/10/2007  . Hyperthyroidism 07/12/2010  . Hypothyroidism 07/10/2007  . Internal hemorrhoids   . Intractable episodic cluster headache 08/29/2017  . Major depressive disorder 07/10/2007  . Mastoiditis of both sides 09/21/2016  . Migraine headache   . Non-insulin dependent type 2 diabetes mellitus 07/06/2010  . Osteoarthritis of multiple joints 07/10/2007  . Perennial allergic rhinitis 09/22/2016  . Postprocedural urinary retention   . Presbycusis of both ears 01/23/2018  . Skin cancer, basal cell   . Thrombocytopenia   . TIA (transient ischemic attack) 2006   per patient's report. He was never officially given this diagnosis  . Upper airway cough syndrome 09/25/2017    Past Surgical History:  Procedure Laterality Date  . COLONOSCOPY  2011  . coronary artery disease status post placement     of drug-eluting stent in the LAD in 2003,eEF 65% then  . DEBRIDEMENT AND CLOSURE WOUND N/A 12/08/2020   Procedure: CLOSURE LEFT POST AURICULAR SCALP WOUND;  Surgeon: Irene Limbo, MD;  Location: [place];  Service: Plastics;  Laterality: N/A;  . esophogeal dilation    . fatty tissue removed     from neck 2003  . HERNIA REPAIR  2007  . mastoidectomy with tympanoplasty    . MELANOMA EXCISION Left 11/17/2020   Procedure: WIDE LOCAL  EXCISION, ADVANCED FLAP CLOSURE LEFT POSTERIOR EAR MELANOMA;  Surgeon: Stark Klein, MD;  Location: [place];  Service: General;  Laterality: Left;  Marland Kitchen MELANOMA EXCISION Left 12/01/2020   Procedure: RE-EXCISION LEFT POSTERIOR AURICULAR MELANOMA WITH TEMPORARY CLOSURE;  Surgeon: Stark Klein, MD;  Location: [place];  Service: General;  Laterality: Left;  60 MIN TOTAL  . POLYPECTOMY  2011   +TA  . right toe bone spur surgery    . SKIN FULL THICKNESS GRAFT Left 12/08/2020   Procedure: Full thickness skin graft from left upper arm;  Surgeon: Irene Limbo, MD;  Location: [place];  Service: Plastics;  Laterality: Left;  . THYROIDECTOMY, PARTIAL  07/2002   right thyroid  . TONSILLECTOMY    . UPPER GI ENDOSCOPY      Current Outpatient Medications:  .  aspirin 81 MG tablet, Take 81 mg by mouth daily., Disp: , Rfl:  .  atorvastatin (LIPITOR) 40 MG tablet, TAKE ONE TABLET BY MOUTH DAILY AT 6PM (Patient taking differently: Take 40 mg by mouth at bedtime.), Disp: 30 tablet, Rfl: 4 .  azelastine (ASTELIN) 0.1 % nasal spray, Place 1 spray into both nostrils daily as needed for rhinitis. Use in each nostril as directed, Disp: , Rfl:  .  Biotin 1000 MCG tablet, Take 1,000 mcg by mouth 2 (two) times daily., Disp: , Rfl:  .  celecoxib (CELEBREX) 200 MG capsule, Take 1 capsule (200 mg total) by mouth daily., Disp: 90 capsule, Rfl: 3 .  cholecalciferol (VITAMIN D) 25 MCG (1000 UNIT) tablet, Take 3,000 Units  by mouth 3 (three) times daily., Disp: , Rfl:  .  Cinnamon 500 MG capsule, Take 1,000 mg by mouth 2 (two) times daily., Disp: , Rfl:  .  Coenzyme Q10 (COQ-10) 100 MG capsule, Take 1 capsule (100 mg total) by mouth daily., Disp: , Rfl:  .  Cyanocobalamin (VITAMIN B-12) 1000 MCG SUBL, Take 1,000 mcg by mouth daily., Disp: , Rfl:  .  Diclofenac Sodium 3 % GEL, Qid prn (Patient taking differently: Apply 1 application topically daily as needed (Arthritis).), Disp: 100 g, Rfl: 3 .   ezetimibe (ZETIA) 10 MG tablet, Take 1 tablet (10 mg total) by mouth daily., Disp: 90 tablet, Rfl: 3 .  ferrous sulfate 325 (65 FE) MG tablet, Take 325 mg by mouth daily with breakfast., Disp: , Rfl:  .  Ginger, Zingiber officinalis, (GINGER ROOT) 550 MG CAPS, Take 550 mg by mouth 2 (two) times daily., Disp: , Rfl:  .  levothyroxine (SYNTHROID) 112 MCG tablet, Take 1 tablet (112 mcg total) by mouth daily before breakfast., Disp: 90 tablet, Rfl: 1 .  losartan (COZAAR) 100 MG tablet, Take 1 tablet (100 mg total) by mouth daily., Disp: 90 tablet, Rfl: 3 .  metFORMIN (GLUCOPHAGE) 500 MG tablet, Take 1 tablet (500 mg total) by mouth 2 (two) times daily with a meal., Disp: 180 tablet, Rfl: 1 .  Multiple Vitamins-Minerals (THERAGRAN-M ADVANCED 50 PLUS PO), Take 1 tablet by mouth daily., Disp: , Rfl:  .  Nerve Stimulator (CEFALY KIT) DEVI, Apply 1 application topically daily at 12 noon. Electrical migraine kit for prevention on treatment of migraine headache, Disp: , Rfl:  .  OVER THE COUNTER MEDICATION, Take 1 capsule by mouth 2 (two) times daily. Migra-eeze gel cap, Disp: , Rfl:  .  pantoprazole (PROTONIX) 40 MG tablet, TAKE ONE TABLET BY MOUTH DAILY (Patient taking differently: Take 40 mg by mouth daily.), Disp: 90 tablet, Rfl: 0 .  propranolol ER (INDERAL LA) 160 MG SR capsule, One tablet po qd (Patient taking differently: Take 160 mg by mouth daily. One tablet po qd), Disp: 90 capsule, Rfl: 3 .  tamsulosin (FLOMAX) 0.4 MG CAPS capsule, Take 0.4 mg by mouth in the morning and at bedtime., Disp: 30 capsule, Rfl:  .  topiramate (TOPAMAX) 50 MG tablet, TAKE ONE TABLET BY MOUTH AT BEDTIME (Patient taking differently: Take 50 mg by mouth daily.), Disp: 30 tablet, Rfl: 9 .  triamcinolone (KENALOG) 0.1 %, Apply 1 application topically daily., Disp: , Rfl:  .  Turmeric 500 MG CAPS, Take 500 mg by mouth 2 (two) times a day., Disp: , Rfl:   Clinical Interview:   The following information was obtained during a  clinical interview with [name] prior to cognitive testing.  Cognitive Symptoms: Decreased short-term memory: Endorsed. Memory difficulties were primarily described as word finding concerns as he reported occasionally losing his train of thought while conversing with others. Words were said to come to him with time, but often when the moment has passed. Other than these events, significant memory dysfunction was largely denied.  Decreased long-term memory: Denied. Decreased attention/concentration: Endorsed. Deficits with sustained attention and distractibility were said to be present "at birth" and have generally persisted throughout his life. These were said to be stable and have not exhibited an appreciable decline over time.  Reduced processing speed: Denied. Difficulties with executive functions: Largely denied. He did acknowledge some acute difficulties with organization and complex planning, but attributed this to his dealing with several recent melanoma-related surgical procedures  and some complications which have arisen. Typically, these difficulties were denied. He also denied trouble with indecision and impulsivity. Overt personality changes were not endorsed.  Difficulties with emotion regulation: Denied. Difficulties with receptive language: Denied assuming he can hear the source of the sound adequately.  Difficulties with word finding: Endorsed (see above).  Decreased visuoperceptual ability: Denied.  Trajectory of deficits: Reported memory dysfunction was said to be present for the past 1-1.5 years, coinciding with him taking topiramate/Topamax to help alleviate migraine headaches.   Difficulties completing ADLs: Denied.  Additional Medical History: History of traumatic brain injury/concussion: Denied. History of stroke: Denied. Medical records suggest that he self-report a potential TIA event many years prior.  History of seizure activity: Denied. History of known exposure to  toxins: Denied. Symptoms of chronic pain: Endorsed. He reported fairly diffuse symptoms of arthritis. He noted engaging in a 1-hour exercise routine each morning which helps increase joint responsiveness and flexibility. This was said to make joint pain less functionally impactful.  Experience of frequent headaches/migraines: Endorsed. Migraine headaches were said to emerge in his 52s and 30s. At one point, he reported severe headache symptoms occurring several times per day. He also reported a history of headache symptoms "running in cycles," where he experiences clusters of symptoms broken up by sometimes lengthy periods of symptom abatement. Since starting topiramate, he reported significant headache reduction, stating that he has gone several months without a migraine episode.  Frequent instances of dizziness/vertigo: Denied.  Sensory changes: He wears glasses as needed with positive effect. He has bilateral sensorineural hearing loss (see medical history) and utilizes hearing aids well. He noted that his sense of taste has seemed off since his various melanoma-related surgical procedures and subsequent complications. Olfactory dysfunction was not reported.  Balance/coordination difficulties: Denied. He also denied any recent falls.  Other motor difficulties: Endorsed. He acknowledged a history of benign essential tremor predominantly affecting his right hand while performing various actions. Symptoms were said to be stable and managed well over time.   Other medical conditions: Regarding ongoing melanoma treatment, he denied undergoing chemotherapy or radiation treatments at the present time.   Sleep History: Estimated hours obtained each night: 7-8 hours.  Difficulties falling asleep: Denied. Difficulties staying asleep: Denied. Feels rested and refreshed upon awakening: Endorsed.  History of snoring: Endorsed per his wife.  History of waking up gasping for air: Denied. Witnessed breath  cessation while asleep: Denied.  History of vivid dreaming: Denied. Excessive movement while asleep: Endorsed occasionally. He reported semi-infrequent instances where he will wake up with the covers "ruffled." However, he also described times where he seems to remain in the same position throughout the entire night.  Instances of acting out his dreams: Denied.  Psychiatric/Behavioral Health History: Depression: He described his current mood as "pretty good" and denied to his knowledge any prior mental health concerns or diagnoses. Medical records do suggest a prior history of major depressive disorder; however, no details surrounding this diagnosis were able to be found. Current or remote suicidal ideation, intent, or plan was denied.  Anxiety: Denied. Mania: Denied. Trauma History: Denied. Visual/auditory hallucinations: Denied. Delusional thoughts: Denied.  Tobacco: Denied. Alcohol: He denied current alcohol consumption as well as a history of problematic alcohol abuse or dependence.  Recreational drugs: Denied. Caffeine: One cup of coffee in the morning.   Family History: Problem Relation Age of Onset  . Heart disease Mother   . Alzheimer's disease Mother   . Heart disease Father  triple by pass  . Hypertension Brother        Prostate Cancer  . Prostate cancer Brother   . Hypertension Brother   . Hypertension Brother   . COPD Brother   . Colon cancer Neg Hx   . Colon polyps Neg Hx   . Rectal cancer Neg Hx   . Stomach cancer Neg Hx   . Esophageal cancer Neg Hx    This information was confirmed by [name].  Academic/Vocational History: Highest level of educational attainment: 14 years. He graduated from high school and earned an Associate's degree in Press photographer. He noted that when he applied himself, he performed very well in academic settings. He described himself as a Dispensing optician and did not endorse any relative weaknesses.  History of developmental delay:  Denied. History of grade repetition: Denied. Enrollment in special education courses: Denied. History of LD/ADHD: Denied.  Employment: Retired. He previously worked as an Optometrist.   Evaluation Results:   Behavioral Observations: [name] was unaccompanied, arrived to his appointment on time, and was appropriately dressed and groomed. He appeared alert and oriented. Observed gait and station were within normal limits. Gross motor functioning appeared intact upon informal observation and no abnormal movements (e.g., tremors) were noted during interview. His affect was generally relaxed and positive, but did range appropriately given the subject being discussed during the clinical interview or the task at hand during testing procedures. Spontaneous speech was fluent and word finding difficulties were not observed during the clinical interview. Thought processes were coherent, organized, and normal in content. Insight into his cognitive difficulties appeared somewhat limited in that objective testing did seem to reveal more dysfunction than he was reporting during interview. During testing, he exhibited a slowed response rate at times, sometimes requiring prompting by the psychometrist for him to provide a response. He also struggled to understand task instructions at times, especially across tasks more complex in nature (e.g., TMT B, D-KEFS Color Word, WCST). Sustained attention was appropriate. Task engagement was adequate and he persisted when challenged. Overall, Mr. Genter was cooperative with the clinical interview and subsequent testing procedures.   Adequacy of Effort: The validity of neuropsychological testing is limited by the extent to which the individual being tested may be assumed to have exerted adequate effort during testing. Mr. Boeve expressed his intention to perform to the best of his abilities and exhibited adequate task engagement and persistence. Scores across stand-alone and embedded  performance validity measures were within expectation. As such, the results of the current evaluation are believed to be a valid representation of Mr. Roblero current cognitive functioning.  Test Results: Mr. Kriesel was fully oriented at the time of the current evaluation.  Intellectual abilities based upon educational and vocational attainment were estimated to be in the average range. Premorbid abilities were estimated to be within the lower limits of the average range based upon a single-word reading test.   Processing speed was below average to average. Basic attention was below average to average. More complex attention (e.g., working memory) was average. Executive functioning was variable, ranging from the well below average to well above average normative ranges. Performance on a task assessing safety and judgment was above average.   Assessed receptive language abilities were below average with some difficulty understanding more complex sentence structure. Mr. Maske answered all questions asked of him appropriately during interview. Assessed expressive language was mildly variable. Phonemic fluency was well below average, semantic fluency was exceptionally low, and confrontation naming was  average.     Assessed visuospatial/visuoconstructional abilities were well below average across a visual discrimination task but average to above average across other tasks. Points were lost on his drawing of a clock due to the accidental omission of the number 9 (however, he still placed an illegible symbol resembling a 0 in that location).    Learning (i.e., encoding) of novel verbal information was below average to average. Spontaneous delayed recall (i.e., retrieval) of previously learned information was exceptionally low across a list learning task but average to above average across story and shape tasks. Retention rates were 90% across a story learning task, 0% across a list learning task, and 89% across a  figure drawing task. Performance across recognition tasks was below average to average, suggesting evidence for information consolidation.   Results of emotional screening instruments suggested that recent symptoms of generalized anxiety were in the minimal range, while symptoms of depression were within normal limits. A screening instrument assessing recent sleep quality suggested the presence of minimal sleep dysfunction.  Tables of Scores:   Note: This summary of test scores accompanies the interpretive report and should not be considered in isolation without reference to the appropriate sections in the text. Descriptors are based on appropriate normative data and may be adjusted based on clinical judgment. The terms "impaired" and "within normal limits (WNL)" are used when a more specific level of functioning cannot be determined.       Effort Testing:   DESCRIPTOR       Dot Counting Test: --- --- Within Expectation  RBANS Effort Index: --- --- Within Expectation  WAIS-IV Reliable Digit Span: --- --- Within Expectation  D-KEFS Color Word Effort Index: --- --- Within Expectation       Orientation:      Raw Score Percentile   NAB Orientation, Form 1 29/29 --- ---       Cognitive Screening:           Raw Score Percentile   SLUMS: 20/30 --- ---       RBANS, Form A: Standard Score/ Scaled Score Percentile   Total Score 85 16 Below Average  Immediate Memory 90 25 Average    List Learning 6 9 Below Average    Story Memory 11 63 Average  Visuospatial/Constructional 112 79 Above Average    Figure Copy 12 75 Above Average    Line Orientation 18/20 51-75 Average  Language 74 4 Well Below Average    Picture Naming 10/10 51-75 Average    Semantic Fluency 1 <1 Exceptionally Low  Attention 82 12 Below Average    Digit Span 7 16 Below Average    Coding 7 16 Below Average  Delayed Memory 88 21 Below Average    List Recall 0/10 <2 Exceptionally Low    List Recognition 18/20 17-25 Below  Average to Average    Story Recall 10 50 Average    Story Recognition 12/12 69+ Average    Figure Recall 13 84 Above Average    Figure Recognition 7/8 53-69 Average       Intellectual Functioning:           Standard Score Percentile   Test of Premorbid Functioning: 93 32 Average       Attention/Executive Function:          Trail Making Test (TMT): Raw Score (T Score) Percentile     Part A 45 secs.,  0 errors (41) 18 Below Average    Part B 125 secs.,  3 errors (42) 21 Below Average         Scaled Score Percentile   WAIS-IV Digit Span: 9 37 Average    Forward 8 25 Average    Backward 9 37 Average    Sequencing 10 50 Average       D-KEFS Color-Word Interference Test: Raw Score (Scaled Score) Percentile     Color Naming 38 secs. (8) 25 Average    Word Reading 24 secs. (11) 63 Average    Inhibition 105 secs. (5) 5 Well Below Average      Total Errors 0 errors (13) 84 Above Average    Inhibition/Switching 79 secs. (10) 50 Average      Total Errors 1 error (12) 75 Above Average       D-KEFS Verbal Fluency Test: Raw Score (Scaled Score) Percentile     Letter Total Correct 15 (4) 2 Well Below Average    Category Total Correct 18 (4) 2 Well Below Average    Category Switching Total Correct 8 (5) 5 Well Below Average    Category Switching Accuracy 7 (6) 9 Below Average      Total Set Loss Errors 1 (11) 63 Average      Total Repetition Errors 5 (8) 25 Average       [place] Card Sorting Test: Raw Score Percentile     Categories (trials) 3 (64) >16 Within Normal Limits    Total Errors 19 73 Average    Perseverative Errors 9 91 Well Above Average    Non-Perseverative Errors 10 38 Average    Failure to Maintain Set 1 --- ---       NAB Executive Functions Module, Form 1: T Score Percentile     Judgment 62 88 Above Average       Language:          Verbal Fluency Test: Raw Score (T Score) Percentile     Phonemic Fluency (FAS) 15 (30) 2 Well Below Average    Animal Fluency 9  (29) 2 Exceptionally Low        NAB Language Module, Form 1: T Score Percentile     Auditory Comprehension 42 21 Below Average    Naming 29/31 (48) 42 Average       Visuospatial/Visuoconstruction:      Raw Score Percentile   Clock Drawing: 8/10 --- Within Normal Limits       NAB Spatial Module, Form 1: T Score Percentile     Visual Discrimination 35 7 Well Below Average       Mood and Personality:      Raw Score Percentile   Geriatric Depression Scale: 2 --- Within Normal Limits  Geriatric Anxiety Scale: 1 --- Minimal    Somatic 0 --- Minimal    Cognitive 0 --- Minimal    Affective 1 --- Minimal       Additional Questionnaires:      Raw Score Percentile   PROMIS Sleep Disturbance Questionnaire: 12 --- None to Slight   Informed Consent and Coding/Compliance:   The current evaluation represents a clinical evaluation for the purposes previously outlined by the referral source and is in no way reflective of a forensic evaluation.   Mr. Colegrove was provided with a verbal description of the nature and purpose of the present neuropsychological evaluation. Also reviewed were the foreseeable risks and/or discomforts and benefits of the procedure, limits of confidentiality, and mandatory reporting requirements of this provider. The patient was given the opportunity to ask questions  and receive answers about the evaluation. Oral consent to participate was provided by the patient.   This evaluation was conducted by Christia Reading, Ph.D., licensed clinical neuropsychologist. Mr. Gearhart completed a clinical interview with Dr. Melvyn Novas, billed as one unit [phone], and 135 minutes of cognitive testing and scoring, billed as one unit [phone] and four additional units [phone]. Psychometrist Milana Kidney, B.S., assisted Dr. Melvyn Novas with test administration and scoring procedures. As a separate and discrete service, Dr. Melvyn Novas spent a total of 120 minutes in interpretation and report writing billed as one unit [phone] and  one unit [phone].

## 2020-12-28 ENCOUNTER — Encounter: Payer: Self-pay | Admitting: Family Medicine

## 2020-12-28 ENCOUNTER — Other Ambulatory Visit (INDEPENDENT_AMBULATORY_CARE_PROVIDER_SITE_OTHER): Payer: Medicare Other

## 2020-12-28 DIAGNOSIS — D509 Iron deficiency anemia, unspecified: Secondary | ICD-10-CM | POA: Diagnosis not present

## 2020-12-28 LAB — CBC WITH DIFFERENTIAL/PLATELET
Basophils Absolute: 0.1 10*3/uL (ref 0.0–0.1)
Basophils Relative: 1.3 % (ref 0.0–3.0)
Eosinophils Absolute: 0.2 10*3/uL (ref 0.0–0.7)
Eosinophils Relative: 5 % (ref 0.0–5.0)
HCT: 38.3 % — ABNORMAL LOW (ref 39.0–52.0)
Hemoglobin: 12.6 g/dL — ABNORMAL LOW (ref 13.0–17.0)
Lymphocytes Relative: 14.1 % (ref 12.0–46.0)
Lymphs Abs: 0.7 10*3/uL (ref 0.7–4.0)
MCHC: 32.8 g/dL (ref 30.0–36.0)
MCV: 80.6 fl (ref 78.0–100.0)
Monocytes Absolute: 0.4 10*3/uL (ref 0.1–1.0)
Monocytes Relative: 7.9 % (ref 3.0–12.0)
Neutro Abs: 3.5 10*3/uL (ref 1.4–7.7)
Neutrophils Relative %: 71.7 % (ref 43.0–77.0)
Platelets: 258 10*3/uL (ref 150.0–400.0)
RBC: 4.76 Mil/uL (ref 4.22–5.81)
RDW: 23.4 % — ABNORMAL HIGH (ref 11.5–15.5)
WBC: 4.9 10*3/uL (ref 4.0–10.5)

## 2020-12-28 LAB — IBC + FERRITIN
Ferritin: 17.5 ng/mL — ABNORMAL LOW (ref 22.0–322.0)
Iron: 46 ug/dL (ref 42–165)
Saturation Ratios: 12.4 % — ABNORMAL LOW (ref 20.0–50.0)
Transferrin: 265 mg/dL (ref 212.0–360.0)

## 2020-12-29 NOTE — Telephone Encounter (Signed)
That is def possible --- -- we can recheck once he has recuperated

## 2020-12-30 ENCOUNTER — Other Ambulatory Visit: Payer: Self-pay | Admitting: Gastroenterology

## 2020-12-30 ENCOUNTER — Other Ambulatory Visit: Payer: Self-pay | Admitting: Cardiology

## 2020-12-31 ENCOUNTER — Other Ambulatory Visit: Payer: Self-pay | Admitting: Family Medicine

## 2020-12-31 ENCOUNTER — Encounter: Payer: Self-pay | Admitting: Family Medicine

## 2020-12-31 MED ORDER — PANTOPRAZOLE SODIUM 40 MG PO TBEC
40.0000 mg | DELAYED_RELEASE_TABLET | Freq: Every day | ORAL | 1 refills | Status: DC
Start: 2020-12-31 — End: 2021-06-28

## 2020-12-31 NOTE — Telephone Encounter (Signed)
I refilled it

## 2021-01-04 ENCOUNTER — Other Ambulatory Visit: Payer: Self-pay

## 2021-01-04 ENCOUNTER — Ambulatory Visit (INDEPENDENT_AMBULATORY_CARE_PROVIDER_SITE_OTHER): Payer: Medicare Other | Admitting: Psychology

## 2021-01-04 DIAGNOSIS — G44011 Episodic cluster headache, intractable: Secondary | ICD-10-CM | POA: Diagnosis not present

## 2021-01-04 DIAGNOSIS — G3184 Mild cognitive impairment, so stated: Secondary | ICD-10-CM | POA: Diagnosis not present

## 2021-01-04 DIAGNOSIS — F0670 Mild neurocognitive disorder due to known physiological condition without behavioral disturbance: Secondary | ICD-10-CM

## 2021-01-04 NOTE — Patient Instructions (Signed)
Given isolated impairments, I do feel that it would be prudent to re-evaluate [name] in the future. As such, a repeat neuropsychological evaluation in 24 months (or sooner if functional decline is noted) is recommended to assess the trajectory of future cognitive decline should it occur. This will also aid in future efforts towards improved diagnostic clarity.  [name] could discuss with his medical doctors about alternative medications to address migraine headaches. However, it may be that the significant benefits [name] receives from topiramate outweigh the likely mild cognitive side effect profile. Only he can make this decision.  [name] is encouraged to attend to lifestyle factors for brain health (e.g., regular physical exercise, good nutrition habits, regular participation in cognitively-stimulating activities, and general stress management techniques), which are likely to have benefits for both emotional adjustment and cognition. Optimal control of vascular risk factors (including safe cardiovascular exercise and adherence to dietary recommendations) is encouraged. Continued participation in activities which provide mental stimulation and social interaction is also recommended.   Memory can be improved using internal strategies such as rehearsal, repetition, chunking, mnemonics, association, and imagery. External strategies such as written notes in a consistently used memory journal, visual and nonverbal auditory cues such as a calendar on the refrigerator or appointments with alarm, such as on a cell phone, can also help maximize recall.    Because he shows better recall for structured information, he will likely understand and retain new information better if it is presented to him in a meaningful or well-organized manner at the outset, such as grouping items into meaningful categories or presenting information in an outlined, bulleted, or story format.   To address problems with  fluctuating attention and executive dysfunction, he may wish to consider:   -Avoiding external distractions when needing to concentrate   -Limiting exposure to fast paced environments with multiple sensory demands   -Writing down complicated information and using checklists   -Attempting and completing one task at a time (i.e., no multi-tasking)   -Verbalizing aloud each step of a task to maintain focus   -Reducing the amount of information considered at one time

## 2021-01-04 NOTE — Progress Notes (Signed)
   Neuropsychology Feedback Session [name]. [facility]  Reason for Referral:   [name] a 75 y.o. right-handed Caucasian male referred by Roma Carney, D.O.,to characterize hiscurrent cognitive functioning and assist with diagnostic clarity and treatment planning in the context of subjective cognitive decline.   Feedback:   [name] completed a comprehensive neuropsychological evaluation on 12/25/2020. Please refer to that encounter for the full report and recommendations. Briefly, results suggested isolated impairments across verbal fluency, retrieval of a previously learned list of words, and visual discrimination. However, other assessments of language and visuospatial abilities were appropriate, as were performances across shape and story-based memory measures. Given the somewhat isolated nature of impairments, the etiology for mild dysfunction is unclear. [name] has numerous cardiovascular ailments in his medical history and neuroimaging which suggested mild small vessel ischemic changes. This increases the likelihood of a vascular contribution to his current presentation. He also reported being prescribed Topamax/topiramate, noting that subjective memory dysfunction was first observed after he started taking this medication. Topamax has well known cognitive side effects which can worsen brain fog and memory functioning. It is possible that some dysfunction is due to medication side effects. Migraine headaches, as well as reported longstanding attentional dysregulation, could further be impacting cognitive performances.  [name] was unaccompanied during the current feedback session. Content of the current session focused on the results of his neuropsychological evaluation. [name] was given the opportunity to ask questions and his questions were answered. He was encouraged to reach out should additional questions arise. A copy of his  report was provided at the conclusion of the visit.      18 minutes were spent conducting the current feedback session with [name], billed as one unit [phone].

## 2021-01-09 DIAGNOSIS — Z23 Encounter for immunization: Secondary | ICD-10-CM | POA: Diagnosis not present

## 2021-01-11 DIAGNOSIS — D485 Neoplasm of uncertain behavior of skin: Secondary | ICD-10-CM | POA: Diagnosis not present

## 2021-01-11 DIAGNOSIS — Z85820 Personal history of malignant melanoma of skin: Secondary | ICD-10-CM | POA: Diagnosis not present

## 2021-01-11 DIAGNOSIS — L814 Other melanin hyperpigmentation: Secondary | ICD-10-CM | POA: Diagnosis not present

## 2021-01-11 DIAGNOSIS — L570 Actinic keratosis: Secondary | ICD-10-CM | POA: Diagnosis not present

## 2021-01-11 DIAGNOSIS — L819 Disorder of pigmentation, unspecified: Secondary | ICD-10-CM | POA: Diagnosis not present

## 2021-01-11 DIAGNOSIS — L989 Disorder of the skin and subcutaneous tissue, unspecified: Secondary | ICD-10-CM | POA: Diagnosis not present

## 2021-01-11 DIAGNOSIS — L905 Scar conditions and fibrosis of skin: Secondary | ICD-10-CM | POA: Diagnosis not present

## 2021-01-11 DIAGNOSIS — L821 Other seborrheic keratosis: Secondary | ICD-10-CM | POA: Diagnosis not present

## 2021-01-11 DIAGNOSIS — D229 Melanocytic nevi, unspecified: Secondary | ICD-10-CM | POA: Diagnosis not present

## 2021-02-26 DIAGNOSIS — D0339 Melanoma in situ of other parts of face: Secondary | ICD-10-CM | POA: Diagnosis not present

## 2021-02-26 DIAGNOSIS — L989 Disorder of the skin and subcutaneous tissue, unspecified: Secondary | ICD-10-CM | POA: Diagnosis not present

## 2021-02-26 DIAGNOSIS — L905 Scar conditions and fibrosis of skin: Secondary | ICD-10-CM | POA: Diagnosis not present

## 2021-03-03 ENCOUNTER — Other Ambulatory Visit: Payer: Self-pay | Admitting: Neurology

## 2021-03-31 ENCOUNTER — Other Ambulatory Visit: Payer: Self-pay

## 2021-03-31 ENCOUNTER — Other Ambulatory Visit: Payer: Self-pay | Admitting: Neurology

## 2021-04-01 ENCOUNTER — Other Ambulatory Visit: Payer: Self-pay

## 2021-04-01 MED ORDER — PROPRANOLOL HCL ER 160 MG PO CP24: ORAL_CAPSULE | ORAL | 2 refills | Status: DC

## 2021-04-01 NOTE — Telephone Encounter (Signed)
Per my chart message pt needs a refill Propanolol ER.

## 2021-04-05 ENCOUNTER — Encounter: Payer: Self-pay | Admitting: Family Medicine

## 2021-04-05 DIAGNOSIS — E039 Hypothyroidism, unspecified: Secondary | ICD-10-CM

## 2021-04-07 MED ORDER — LEVOTHYROXINE SODIUM 112 MCG PO TABS: 112.0000 ug | ORAL_TABLET | Freq: Every day | ORAL | 1 refills | Status: DC

## 2021-04-28 NOTE — Progress Notes (Signed)
HPI: FU CAD with prior PCI of his LAD in May 2003. Cardiac cath 2010 showed 30-40% LAD just distal to the stent. There was no other obstructive disease noted. The ejection fraction is 50%. He has been treated medically. Abdominal ultrasound in Oct 2011 showed no aneurysm. Nuclear study in March 2020 showed ejection fraction 56%.  Patient had significant ST changes but perfusion was normal.  Echocardiogram May 2020 showed normal LV function, mild left atrial enlargement.  Cardiac MRI October 2020 showed basal anteroseptal enhancement which could be seen with prior myocarditis but also could be seen with sarcoid.  Pattern not consistent with amyloid.  Ejection fraction 61% and normal. Since I last saw him, the patient denies any dyspnea on exertion, orthopnea, PND, pedal edema, palpitations, syncope or chest pain.   Current Outpatient Medications  Medication Sig Dispense Refill   aspirin 81 MG tablet Take 81 mg by mouth daily.     atorvastatin (LIPITOR) 40 MG tablet TAKE ONE TABLET BY MOUTH DAILY AT 6PM 90 tablet 1   azelastine (ASTELIN) 0.1 % nasal spray Place 1 spray into both nostrils daily as needed for rhinitis. Use in each nostril as directed     Biotin 1000 MCG tablet Take 1,000 mcg by mouth 2 (two) times daily.     celecoxib (CELEBREX) 200 MG capsule Take 1 capsule (200 mg total) by mouth daily. 90 capsule 3   cholecalciferol (VITAMIN D) 25 MCG (1000 UNIT) tablet Take 3,000 Units by mouth 3 (three) times daily.     Cinnamon 500 MG capsule Take 1,000 mg by mouth 2 (two) times daily.     Coenzyme Q10 (COQ-10) 100 MG capsule Take 1 capsule (100 mg total) by mouth daily.     Cyanocobalamin (VITAMIN B-12) 1000 MCG SUBL Take 1,000 mcg by mouth daily.     Diclofenac Sodium 3 % GEL Qid prn (Patient taking differently: Apply 1 application topically daily as needed (Arthritis).) 100 g 3   ezetimibe (ZETIA) 10 MG tablet Take 1 tablet (10 mg total) by mouth daily. 90 tablet 3   ferrous sulfate  325 (65 FE) MG tablet Take 325 mg by mouth daily with breakfast.     Ginger, Zingiber officinalis, (GINGER ROOT) 550 MG CAPS Take 550 mg by mouth 2 (two) times daily.     levothyroxine (SYNTHROID) 112 MCG tablet Take 1 tablet (112 mcg total) by mouth daily before breakfast. 90 tablet 1   losartan (COZAAR) 100 MG tablet Take 1 tablet (100 mg total) by mouth daily. 90 tablet 3   metFORMIN (GLUCOPHAGE) 500 MG tablet Take 1 tablet (500 mg total) by mouth 2 (two) times daily with a meal. 180 tablet 1   Multiple Vitamins-Minerals (THERAGRAN-M ADVANCED 50 PLUS PO) Take 1 tablet by mouth daily.     Nerve Stimulator (CEFALY KIT) DEVI Apply 1 application topically daily at 12 noon. Electrical migraine kit for prevention on treatment of migraine headache     OVER THE COUNTER MEDICATION Take 1 capsule by mouth 2 (two) times daily. Migra-eeze gel cap     pantoprazole (PROTONIX) 40 MG tablet Take 1 tablet (40 mg total) by mouth daily. 90 tablet 1   propranolol ER (INDERAL LA) 160 MG SR capsule TAKE ONE CAPSULE BY MOUTH DAILY 30 capsule 2   tamsulosin (FLOMAX) 0.4 MG CAPS capsule Take 0.4 mg by mouth in the morning and at bedtime. 30 capsule    topiramate (TOPAMAX) 50 MG tablet TAKE ONE TABLET BY  MOUTH AT BEDTIME (Patient taking differently: Take 50 mg by mouth daily.) 30 tablet 9   triamcinolone (KENALOG) 0.1 % Apply 1 application topically daily.     Turmeric 500 MG CAPS Take 500 mg by mouth 2 (two) times a day.     No current facility-administered medications for this visit.     Past Medical History:  Diagnosis Date   Abdominal bruit 07/22/2010   Acute sinusitis 03/26/2015   Acute suppr otitis media w spon rupt ear drum, recur, r ear 04/06/2017   Angina pectoris 12/11/2008   Benign essential tremor 08/29/2017   Bilateral sensorineural hearing loss 11/30/2015   CAD S/P percutaneous coronary angioplasty 12/11/2008   LAD PCI '03,  Cath 2010 showed 40% LAD- medical Rx Myoview March 2020 negative for  ischemia but positive for ST depression- Imdur added.  Formatting of this note might be different from the original. LAD PCI '03,  Cath 2010 showed 40% LAD- medical Rx Myoview March 2020 negative for ischemia but positive for ST depression- Imdur added.  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 Echo May 2020- EF 60-65% with mild LVH  Last Assessment & Plan:  Well controlled, no changes to meds. Encouraged heart healthy diet such as the DASH diet and exercise as tolerated.   Eustachian tube dysfunction, bilateral 11/30/2015   History of colonic polyps 07/12/2010   History of GI bleed 04/23/2020   History of left mastoidectomy 02/20/2020   History of placement of ear tubes    Hyperlipidemia associated with type 2 diabetes mellitus 07/10/2007   Hyperthyroidism 07/12/2010   Hypothyroidism 07/10/2007   Internal hemorrhoids    Intractable episodic cluster headache 08/29/2017   Major depressive disorder 07/10/2007   Mastoiditis of both sides 09/21/2016   Migraine headache    Mild neurocognitive disorder due to multiple etiologies 12/25/2020   Non-insulin dependent type 2 diabetes mellitus 07/06/2010   Osteoarthritis of multiple joints 07/10/2007   Perennial allergic rhinitis 09/22/2016   Postprocedural urinary retention    Presbycusis of both ears 01/23/2018   Skin cancer, basal cell    Thrombocytopenia    TIA (transient ischemic attack) 2006   per patient's report. He was never officially given this diagnosis   Upper  airway cough syndrome 09/25/2017    Past Surgical History:  Procedure Laterality Date   COLONOSCOPY  2011   coronary artery disease status post placement     of drug-eluting stent in the LAD in 2003,eEF 65% then   [place] N/A 12/08/2020   Procedure: CLOSURE LEFT POST AURICULAR SCALP WOUND;  Surgeon: Irene Limbo, MD;  Location: [facility];  Service: Plastics;  Laterality: N/A;   esophogeal dilation     fatty tissue removed     from neck 2003   HERNIA REPAIR  2007   mastoidectomy with tympanoplasty     MELANOMA EXCISION Left 11/17/2020   Procedure: WIDE LOCAL EXCISION, ADVANCED FLAP CLOSURE LEFT POSTERIOR EAR MELANOMA;  Surgeon: Stark Klein, MD;  Location: [facility];  Service: General;  Laterality: Left;   MELANOMA EXCISION Left 12/01/2020   Procedure: RE-EXCISION LEFT POSTERIOR AURICULAR MELANOMA WITH TEMPORARY CLOSURE;  Surgeon: Stark Klein, MD;  Location: [facility];  Service: General;  Laterality: Left;  60 MIN TOTAL   POLYPECTOMY  2011   +TA   right toe bone spur surgery     SKIN FULL THICKNESS GRAFT Left 12/08/2020   Procedure: Full thickness skin graft from left upper arm;  Surgeon: Irene Limbo, MD;  Location: [facility];  Service: Plastics;  Laterality: Left;   THYROIDECTOMY, PARTIAL  07/2002   right thyroid   TONSILLECTOMY     UPPER GI ENDOSCOPY      Social History   Socioeconomic History   Marital status: Married    Spouse name: Charleen   Number of children: 0   Years of education: 14   Highest education level: Associate degree: occupational, Hotel manager, or vocational program  Occupational History   Occupation: retired    Fish farm manager: RETIRED    Comment: accountant  Tobacco Use   Smoking status: Former    Years: 5.00    Types: Cigarettes    Quit date: 10/10/1965    Years since quitting: 55.5   Smokeless tobacco: Never  Vaping Use   Vaping Use: Never used  Substance and Sexual Activity    Alcohol use: No   Drug use: No   Sexual activity: Yes    Partners: Female  Other Topics Concern   Not on file  Social History Narrative   Pt lives with spouse at private home in 2 story home- he has no children- right handed- he drinks coffee daily, tea sometimes, soda not all the time. Exercise-- treadmill, stationary bike   Right handed   Social Determinants of Health   Financial Resource Strain: Low Risk    Difficulty of Paying Living Expenses: Not hard at all  Food Insecurity: No Food Insecurity   Worried About Charity fundraiser in the Last Year: Never true   Ran Out of Food in the Last Year: Never true  Transportation Needs: No Transportation Needs   Lack of Transportation (Medical): No   Lack of Transportation (Non-Medical): No  Physical Activity: Sufficiently Active   Days of Exercise per Week: 7 days   Minutes of Exercise per Session: 60 min  Stress: No Stress Concern Present   Feeling of Stress : Not at all  Social Connections: Socially Integrated   Frequency of Communication with Friends and Family: More than three times a week   Frequency of Social Gatherings with Friends and Family: More than three times a week   Attends Religious Services: More than 4 times per year   Active Member of [employer] or Organizations: Yes   Attends Archivist Meetings: 1 to 4 times per year   Marital Status: Married  Human resources officer Violence: Not At Risk   Fear of Current or Ex-Partner: No   Emotionally Abused: No   Physically Abused: No   Sexually Abused: No    Family History  Problem Relation Age of Onset   Heart disease Mother    Alzheimer's disease Mother    Heart disease Father        triple by pass   Hypertension Brother        Prostate Cancer   Prostate cancer Brother    Hypertension Brother    Hypertension Brother    COPD Brother    Colon cancer Neg Hx  Colon polyps Neg Hx    Rectal cancer Neg Hx    Stomach cancer Neg Hx    Esophageal cancer Neg Hx      ROS: no fevers or chills, productive cough, hemoptysis, dysphasia, odynophagia, melena, hematochezia, dysuria, hematuria, rash, seizure activity, orthopnea, PND, pedal edema, claudication. Remaining systems are negative.  Physical Exam: Well-developed well-nourished in no acute distress.  Skin is warm and dry.  HEENT is normal.  Neck is supple.  Chest is clear to auscultation with normal expansion.  Cardiovascular exam is regular rate and rhythm.  Abdominal exam nontender or distended. No masses palpated. Extremities show no edema. neuro grossly intact  ECG-normal sinus rhythm at a rate of 60, nonspecific ST changes.  Personally reviewed  A/P  1 lower ext edema-resolved following treatment of anemia; likely related to high output; previous cardiac eval negative.   2 CAD-Doing well from a symptomatic standpoint; continue medical therapy with ASA and statin.    3 hypertension-BP controlled; continue present meds and follow.   4 hyperlipidemia-continue statin.  Kirk Ruths, MD

## 2021-04-30 ENCOUNTER — Encounter: Payer: Self-pay | Admitting: Cardiology

## 2021-04-30 ENCOUNTER — Ambulatory Visit (INDEPENDENT_AMBULATORY_CARE_PROVIDER_SITE_OTHER): Payer: Medicare Other | Admitting: Cardiology

## 2021-04-30 ENCOUNTER — Other Ambulatory Visit: Payer: Self-pay

## 2021-04-30 VITALS — BP 138/70 | HR 60 | Ht 69.0 in | Wt 153.0 lb

## 2021-04-30 DIAGNOSIS — I2510 Atherosclerotic heart disease of native coronary artery without angina pectoris: Secondary | ICD-10-CM

## 2021-04-30 DIAGNOSIS — E785 Hyperlipidemia, unspecified: Secondary | ICD-10-CM | POA: Diagnosis not present

## 2021-04-30 DIAGNOSIS — Z9861 Coronary angioplasty status: Secondary | ICD-10-CM

## 2021-04-30 DIAGNOSIS — I10 Essential (primary) hypertension: Secondary | ICD-10-CM | POA: Diagnosis not present

## 2021-04-30 NOTE — Patient Instructions (Signed)

## 2021-05-13 ENCOUNTER — Encounter: Payer: Self-pay | Admitting: Family Medicine

## 2021-05-13 ENCOUNTER — Other Ambulatory Visit: Payer: Self-pay

## 2021-05-13 ENCOUNTER — Ambulatory Visit (INDEPENDENT_AMBULATORY_CARE_PROVIDER_SITE_OTHER): Payer: Medicare Other | Admitting: Family Medicine

## 2021-05-13 VITALS — BP 152/70 | HR 59 | Temp 97.8°F | Resp 18 | Ht 69.0 in | Wt 153.4 lb

## 2021-05-13 DIAGNOSIS — E1165 Type 2 diabetes mellitus with hyperglycemia: Secondary | ICD-10-CM

## 2021-05-13 DIAGNOSIS — Z9861 Coronary angioplasty status: Secondary | ICD-10-CM

## 2021-05-13 DIAGNOSIS — E1169 Type 2 diabetes mellitus with other specified complication: Secondary | ICD-10-CM

## 2021-05-13 DIAGNOSIS — Z23 Encounter for immunization: Secondary | ICD-10-CM

## 2021-05-13 DIAGNOSIS — E785 Hyperlipidemia, unspecified: Secondary | ICD-10-CM

## 2021-05-13 DIAGNOSIS — S81802A Unspecified open wound, left lower leg, initial encounter: Secondary | ICD-10-CM

## 2021-05-13 DIAGNOSIS — I2510 Atherosclerotic heart disease of native coronary artery without angina pectoris: Secondary | ICD-10-CM

## 2021-05-13 DIAGNOSIS — I10 Essential (primary) hypertension: Secondary | ICD-10-CM | POA: Diagnosis not present

## 2021-05-13 DIAGNOSIS — G44011 Episodic cluster headache, intractable: Secondary | ICD-10-CM | POA: Diagnosis not present

## 2021-05-13 DIAGNOSIS — E038 Other specified hypothyroidism: Secondary | ICD-10-CM | POA: Diagnosis not present

## 2021-05-13 LAB — MICROALBUMIN / CREATININE URINE RATIO
Creatinine,U: 123.1 mg/dL
Microalb Creat Ratio: 2.6 mg/g (ref 0.0–30.0)
Microalb, Ur: 3.2 mg/dL — ABNORMAL HIGH (ref 0.0–1.9)

## 2021-05-13 MED ORDER — KETOROLAC TROMETHAMINE 60 MG/2ML IM SOLN
60.0000 mg | Freq: Once | INTRAMUSCULAR | Status: AC
  Administered 2021-05-13: 60 mg via INTRAMUSCULAR

## 2021-05-13 NOTE — Progress Notes (Signed)
Subjective:   By signing my name below, I, [name], attest that this documentation has been prepared under the direction and in the presence of Dr. Roma Schanz, DO. 05/13/2021    Patient ID: [name], male    DOB: 1946/03/31, 75 y.o.   MRN: [id number]  Chief Complaint  Patient presents with   Hypertension   Hyperlipidemia   Follow-up    HPI Patient is in today for a office visit. He reports that at 10:00 am today he developed a migraine. He started taking cefaly daily PO to manage it and found relief. He notes that the faster he takes cefaly during the onset of a migraine the more relief he gets. He notes even after taking cefaly he feels a hangover feeling that worsens when tilting his head. He currently sees his neurologist specialist to manage his symptoms. He prefers not taking pain medications to manage his migraines. He is willing to take a Toradol injection to help manage his migraine at this time. Otherwise he is doing well during this visit. He reports being cut by a metal blade from a tractor under his left knee. He is willing to get a tetanus vaccine due to this accident.  His blood pressure is elevated during this visit. He notes this is may be due to his migraine. He continues taking 100 mg losartan daily PO and reports no new issues while taking it. He denies any leg swelling at this time.   BP Readings from Last 3 Encounters:  05/13/21 (!) 152/70  04/30/21 138/70  12/08/20 (!) 208/97   He does not regularly check his blood sugars at home. He continues taking 500 mg metformin 2x daily PO and reports no new issues while taking it.   Lab Results  Component Value Date   HGBA1C 6.1 11/10/2020   He continues using Voltaren rubbing gel to manage his joint pain and finds great relief. He notes that gets migraines more often while taking Voltaren.  He recently had his 2nd pfizer Covid-19 vaccine and now has 4 Covid-19 vaccines at this time.    Past  Medical History:  Diagnosis Date   Abdominal bruit 07/22/2010   Acute sinusitis 03/26/2015   Acute suppr otitis media w spon rupt ear drum, recur, r ear 04/06/2017   Angina pectoris 12/11/2008   Benign essential tremor 08/29/2017   Bilateral sensorineural hearing loss 11/30/2015   CAD S/P percutaneous coronary angioplasty 12/11/2008   LAD PCI '03,  Cath 2010 showed 40% LAD- medical Rx Myoview March 2020 negative for ischemia but positive for ST depression- Imdur added.  Formatting of this note might be different from the original. LAD PCI '03,  Cath 2010 showed 40% LAD- medical Rx Myoview March 2020 negative for ischemia but positive for ST depression- Imdur added.  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 Echo May 2020- EF 60-65% with mild LVH  Last Assessment & Plan:  Well controlled, no changes to meds. Encouraged heart healthy diet such as the DASH  diet and exercise as tolerated.   Eustachian tube dysfunction, bilateral 11/30/2015   History of colonic polyps 07/12/2010   History of GI bleed 04/23/2020   History of left mastoidectomy 02/20/2020   History of placement of ear tubes    Hyperlipidemia associated with type 2 diabetes mellitus 07/10/2007   Hyperthyroidism 07/12/2010   Hypothyroidism 07/10/2007   Internal hemorrhoids    Intractable episodic cluster headache 08/29/2017   Major depressive disorder 07/10/2007   Mastoiditis of both sides 09/21/2016   Migraine headache    Mild neurocognitive disorder due to  multiple etiologies 12/25/2020   Non-insulin dependent type 2 diabetes mellitus 07/06/2010   Osteoarthritis of multiple joints 07/10/2007   Perennial allergic rhinitis 09/22/2016   Postprocedural urinary retention    Presbycusis of both ears 01/23/2018   Skin cancer, basal cell    Thrombocytopenia    TIA (transient ischemic attack) 2006   per patient's report. He was never officially given this diagnosis   Upper airway cough syndrome 09/25/2017    Past Surgical History:  Procedure Laterality Date   COLONOSCOPY  2011   coronary artery disease status post placement     of drug-eluting stent in the LAD in 2003,eEF 65% then   [place] N/A 12/08/2020   Procedure: CLOSURE LEFT POST AURICULAR SCALP WOUND;  Surgeon: Irene Limbo, MD;  Location: [place];  Service: Plastics;  Laterality: N/A;   esophogeal dilation     fatty tissue removed     from neck 2003   HERNIA REPAIR  2007   mastoidectomy with tympanoplasty     MELANOMA EXCISION Left 11/17/2020   Procedure: WIDE LOCAL EXCISION, ADVANCED FLAP CLOSURE LEFT POSTERIOR EAR MELANOMA;  Surgeon: Stark Klein, MD;  Location: [place];  Service: General;  Laterality: Left;   MELANOMA EXCISION Left 12/01/2020   Procedure: RE-EXCISION LEFT POSTERIOR AURICULAR MELANOMA WITH TEMPORARY CLOSURE;  Surgeon: Stark Klein, MD;  Location: [facility];  Service: General;  Laterality: Left;  60 MIN TOTAL   POLYPECTOMY  2011   +TA   right toe bone spur surgery     SKIN FULL THICKNESS GRAFT Left 12/08/2020   Procedure: Full thickness skin graft from left upper arm;  Surgeon: Irene Limbo, MD;  Location: [facility];  Service: Plastics;  Laterality: Left;   THYROIDECTOMY, PARTIAL  07/2002   right thyroid   TONSILLECTOMY     UPPER GI ENDOSCOPY      Family History  Problem Relation Age of Onset   Heart disease Mother    Alzheimer's disease Mother    Heart disease Father        triple by  pass   Hypertension Brother        Prostate Cancer   Prostate cancer Brother    Hypertension Brother    Hypertension Brother    COPD Brother    Colon cancer Neg Hx    Colon polyps Neg Hx    Rectal cancer Neg Hx    Stomach cancer Neg Hx    Esophageal cancer Neg Hx     Social History   Socioeconomic History   Marital status: Married    Spouse name: Charleen   Number of children: 0   Years of education: 14   Highest education level: Associate degree: occupational, Hotel manager, or vocational program  Occupational History   Occupation: retired    Fish farm manager: RETIRED    Comment: accountant  Tobacco Use   Smoking status: Former  Years: 5.00    Types: Cigarettes    Quit date: 10/10/1965    Years since quitting: 55.6   Smokeless tobacco: Never  Vaping Use   Vaping Use: Never used  Substance and Sexual Activity   Alcohol use: No   Drug use: No   Sexual activity: Yes    Partners: Female  Other Topics Concern   Not on file  Social History Narrative   Pt lives with spouse at private home in 2 story home- he has no children- right handed- he drinks coffee daily, tea sometimes, soda not all the time. Exercise-- treadmill, stationary bike   Right handed   Social Determinants of Health   Financial Resource Strain: Low Risk    Difficulty of Paying Living Expenses: Not hard at all  Food Insecurity: No Food Insecurity   Worried About Charity fundraiser in the Last Year: Never true   Ran Out of Food in the Last Year: Never true  Transportation Needs: No Transportation Needs   Lack of Transportation (Medical): No   Lack of Transportation (Non-Medical): No  Physical Activity: Sufficiently Active   Days of Exercise per Week: 7 days   Minutes of Exercise per Session: 60 min  Stress: No Stress Concern Present   Feeling of Stress : Not at all  Social Connections: Socially Integrated   Frequency of Communication with Friends and Family: More than three times a week   Frequency of  Social Gatherings with Friends and Family: More than three times a week   Attends Religious Services: More than 4 times per year   Active Member of [employer] or Organizations: Yes   Attends Archivist Meetings: 1 to 4 times per year   Marital Status: Married  Human resources officer Violence: Not At Risk   Fear of Current or Ex-Partner: No   Emotionally Abused: No   Physically Abused: No   Sexually Abused: No    Outpatient Medications Prior to Visit  Medication Sig Dispense Refill   aspirin 81 MG tablet Take 81 mg by mouth daily.     atorvastatin (LIPITOR) 40 MG tablet TAKE ONE TABLET BY MOUTH DAILY AT 6PM 90 tablet 1   azelastine (ASTELIN) 0.1 % nasal spray Place 1 spray into both nostrils daily as needed for rhinitis. Use in each nostril as directed     Biotin 1000 MCG tablet Take 1,000 mcg by mouth 2 (two) times daily.     celecoxib (CELEBREX) 200 MG capsule Take 1 capsule (200 mg total) by mouth daily. 90 capsule 3   cholecalciferol (VITAMIN D) 25 MCG (1000 UNIT) tablet Take 3,000 Units by mouth 3 (three) times daily.     Cinnamon 500 MG capsule Take 1,000 mg by mouth 2 (two) times daily.     Coenzyme Q10 (COQ-10) 100 MG capsule Take 1 capsule (100 mg total) by mouth daily.     Cyanocobalamin (VITAMIN B-12) 1000 MCG SUBL Take 1,000 mcg by mouth daily.     Diclofenac Sodium 3 % GEL Qid prn (Patient taking differently: Apply 1 application topically daily as needed (Arthritis).) 100 g 3   ezetimibe (ZETIA) 10 MG tablet Take 1 tablet (10 mg total) by mouth daily. 90 tablet 3   ferrous sulfate 325 (65 FE) MG tablet Take 325 mg by mouth daily with breakfast.     Ginger, Zingiber officinalis, (GINGER ROOT) 550 MG CAPS Take 550 mg by mouth 2 (two) times daily.     levothyroxine (SYNTHROID) 112 MCG tablet  Take 1 tablet (112 mcg total) by mouth daily before breakfast. 90 tablet 1   losartan (COZAAR) 100 MG tablet Take 1 tablet (100 mg total) by mouth daily. 90 tablet 3   metFORMIN (GLUCOPHAGE)  500 MG tablet Take 1 tablet (500 mg total) by mouth 2 (two) times daily with a meal. 180 tablet 1   Multiple Vitamins-Minerals (THERAGRAN-M ADVANCED 50 PLUS PO) Take 1 tablet by mouth daily.     Nerve Stimulator (CEFALY KIT) DEVI Apply 1 application topically daily at 12 noon. Electrical migraine kit for prevention on treatment of migraine headache     OVER THE COUNTER MEDICATION Take 1 capsule by mouth 2 (two) times daily. Migra-eeze gel cap     pantoprazole (PROTONIX) 40 MG tablet Take 1 tablet (40 mg total) by mouth daily. 90 tablet 1   propranolol ER (INDERAL LA) 160 MG SR capsule TAKE ONE CAPSULE BY MOUTH DAILY 30 capsule 2   tamsulosin (FLOMAX) 0.4 MG CAPS capsule Take 0.4 mg by mouth in the morning and at bedtime. 30 capsule    topiramate (TOPAMAX) 50 MG tablet TAKE ONE TABLET BY MOUTH AT BEDTIME (Patient taking differently: Take 50 mg by mouth daily.) 30 tablet 9   triamcinolone (KENALOG) 0.1 % Apply 1 application topically daily.     No facility-administered medications prior to visit.    Allergies  Allergen Reactions   Sulfa Antibiotics Other (See Comments)    Severe crippling  joint and muscle pain and redness   Sulfamethoxazole-Trimethoprim Other (See Comments)    Joint and muscle pain   Sulfasalazine Other (See Comments)   Codeine Other (See Comments)    Unknown   Crestor [Rosuvastatin Calcium] Other (See Comments)    Severe joint and muscle pain   Hydrocodone-Acetaminophen Other (See Comments)    To strong vicodin    Other     Apples and tomatoes...... Nausea and migraines.     Rosuvastatin     REACTION: REALLY BAD JOINT/MUSCLE PAIN    Review of Systems  Constitutional:  Negative for chills, fever and malaise/fatigue.  HENT:  Negative for congestion and hearing loss.   Eyes:  Negative for blurred vision and discharge.  Respiratory:  Negative for cough, sputum production and shortness of breath.   Cardiovascular:  Negative for chest pain, palpitations and leg  swelling.  Gastrointestinal:  Negative for abdominal pain, blood in stool, constipation, diarrhea, heartburn, nausea and vomiting.  Genitourinary:  Negative for dysuria, frequency, hematuria and urgency.  Musculoskeletal:  Negative for back pain, falls and myalgias.  Skin:  Negative for rash.       (+)small laceration under left knee  Neurological:  Positive for headaches (migraine). Negative for dizziness, sensory change, loss of consciousness and weakness.  Endo/Heme/Allergies:  Negative for environmental allergies. Does not bruise/bleed easily.  Psychiatric/Behavioral:  Negative for depression and suicidal ideas. The patient is not nervous/anxious and does not have insomnia.       Objective:    Physical Exam Vitals and nursing note reviewed.  Constitutional:      General: He is not in acute distress.    Appearance: Normal appearance. He is well-developed. He is not ill-appearing.  HENT:     Head: Normocephalic and atraumatic.     Right Ear: External ear normal.     Left Ear: External ear normal.  Eyes:     Extraocular Movements: Extraocular movements intact.     Pupils: Pupils are equal, round, and reactive to light.  Neck:  Thyroid: No thyromegaly.  Cardiovascular:     Rate and Rhythm: Normal rate and regular rhythm.     Heart sounds: Normal heart sounds. No murmur heard.   No gallop.  Pulmonary:     Effort: Pulmonary effort is normal. No respiratory distress.     Breath sounds: Normal breath sounds. No wheezing or rales.  Chest:     Chest wall: No tenderness.  Musculoskeletal:        General: No tenderness.     Cervical back: Normal range of motion and neck supple.     Right lower leg: No edema.     Left lower leg: No edema.  Feet:     Comments: Diabetic Foot Exam - Simple   No data filed    Skin:    General: Skin is warm and dry.     Comments: Small abrasion on left shin with dry blood, no signs of infection.  Neurological:     Mental Status: He is alert and  oriented to person, place, and time.  Psychiatric:        Behavior: Behavior normal.        Thought Content: Thought content normal.        Judgment: Judgment normal.    BP (!) 152/70 (BP Location: Right Arm, Patient Position: Sitting, Cuff Size: Normal)   Pulse (!) 59   Temp 97.8 F (36.6 C) (Oral)   Resp 18   Ht '5\' 9"'  (1.753 m)   Wt 153 lb 6.4 oz (69.6 kg)   SpO2 97%   BMI 22.65 kg/m  Wt Readings from Last 3 Encounters:  05/13/21 153 lb 6.4 oz (69.6 kg)  04/30/21 153 lb (69.4 kg)  12/08/20 160 lb 15 oz (73 kg)    Diabetic Foot Exam - Simple   Simple Foot Form Diabetic Foot exam was performed with the following findings: Yes 05/13/2021  4:33 PM  Visual Inspection No deformities, no ulcerations, no other skin breakdown bilaterally: Yes Sensation Testing Intact to touch and monofilament testing bilaterally: Yes Pulse Check Posterior Tibialis and Dorsalis pulse intact bilaterally: Yes Comments    Lab Results  Component Value Date   WBC 4.9 12/28/2020   HGB 12.6 (L) 12/28/2020   HCT 38.3 (L) 12/28/2020   PLT 258.0 12/28/2020   GLUCOSE 117 (H) 12/04/2020   CHOL 99 11/10/2020   TRIG 52.0 11/10/2020   HDL 49.60 11/10/2020   LDLCALC 39 11/10/2020   ALT 18 11/10/2020   AST 18 11/10/2020   NA 140 12/04/2020   K 4.6 12/04/2020   CL 110 12/04/2020   CREATININE 1.33 (H) 12/04/2020   BUN 22 12/04/2020   CO2 23 12/04/2020   TSH 2.37 11/10/2020   PSA 5.08 (H) 10/18/2018   INR 1.0 07/01/2019   HGBA1C 6.1 11/10/2020   MICROALBUR 1.6 11/10/2020    Lab Results  Component Value Date   TSH 2.37 11/10/2020   Lab Results  Component Value Date   WBC 4.9 12/28/2020   HGB 12.6 (L) 12/28/2020   HCT 38.3 (L) 12/28/2020   MCV 80.6 12/28/2020   PLT 258.0 12/28/2020   Lab Results  Component Value Date   NA 140 12/04/2020   K 4.6 12/04/2020   CHLORIDE 107 01/05/2017   CO2 23 12/04/2020   GLUCOSE 117 (H) 12/04/2020   BUN 22 12/04/2020   CREATININE 1.33 (H) 12/04/2020    BILITOT 0.5 11/10/2020   ALKPHOS 56 11/10/2020   AST 18 11/10/2020   ALT 18 11/10/2020  PROT 5.8 (L) 11/10/2020   ALBUMIN 3.6 11/10/2020   CALCIUM 8.9 12/04/2020   ANIONGAP 7 12/04/2020   EGFR 55 (L) 01/05/2017   GFR 63.00 11/10/2020   Lab Results  Component Value Date   CHOL 99 11/10/2020   Lab Results  Component Value Date   HDL 49.60 11/10/2020   Lab Results  Component Value Date   LDLCALC 39 11/10/2020   Lab Results  Component Value Date   TRIG 52.0 11/10/2020   Lab Results  Component Value Date   CHOLHDL 2 11/10/2020   Lab Results  Component Value Date   HGBA1C 6.1 11/10/2020       Assessment & Plan:   Problem List Items Addressed This Visit       Unprioritized   Essential hypertension    Well controlled, no changes to meds. Encouraged heart healthy diet such as the DASH diet and exercise as tolerated.         Hyperlipidemia associated with type 2 diabetes mellitus ([place])    Encourage heart healthy diet such as MIND or DASH diet, increase exercise, avoid trans fats, simple carbohydrates and processed foods, consider a krill or fish or flaxseed oil cap daily.        Relevant Orders   Lipid panel   Comprehensive metabolic panel   Hypothyroidism    Check labs  con't meds        Intractable episodic cluster headache    Per neuro toradol im given        Other Visit Diagnoses     Type 2 diabetes mellitus with hyperglycemia, without long-term current use of insulin (HCC)    -  Primary   Relevant Orders   Lipid panel   Hemoglobin A1c   Comprehensive metabolic panel   Microalbumin / creatinine urine ratio   Primary hypertension       Relevant Orders   Hemoglobin A1c   Comprehensive metabolic panel   Microalbumin / creatinine urine ratio   Need for tetanus booster       Relevant Orders   Td vaccine greater than or equal to 7yo preservative free IM (Completed)   Wound of left lower extremity, initial encounter       Relevant Medications    ketorolac (TORADOL) injection 60 mg (Completed)   Other Relevant Orders   Td vaccine greater than or equal to 7yo preservative free IM (Completed)        Meds ordered this encounter  Medications   ketorolac (TORADOL) injection 60 mg    I, Dr. Roma Schanz, DO, personally preformed the services described in this documentation.  All medical record entries made by the scribe were at my direction and in my presence.  I have reviewed the chart and discharge instructions (if applicable) and agree that the record reflects my personal performance and is accurate and complete. 05/13/2021   I,[name],acting as a scribe for [name], DO.,have documented all relevant documentation on the behalf of [name], DO,as directed by  [name], DO while in the presence of [name], DO.   [name], DO

## 2021-05-13 NOTE — Assessment & Plan Note (Signed)
Encourage heart healthy diet such as MIND or DASH diet, increase exercise, avoid trans fats, simple carbohydrates and processed foods, consider a krill or fish or flaxseed oil cap daily.  °

## 2021-05-13 NOTE — Assessment & Plan Note (Signed)
Well controlled, no changes to meds. Encouraged heart healthy diet such as the DASH diet and exercise as tolerated.  °

## 2021-05-13 NOTE — Patient Instructions (Signed)
https://www.nhlbi.nih.gov/files/docs/public/heart/dash_brief.pdf">  DASH Eating Plan DASH stands for Dietary Approaches to Stop Hypertension. The DASH eating plan is a healthy eating plan that has been shown to: Reduce high blood pressure (hypertension). Reduce your risk for type 2 diabetes, heart disease, and stroke. Help with weight loss. What are tips for following this plan? Reading food labels Check food labels for the amount of salt (sodium) per serving. Choose foods with less than 5 percent of the Daily Value of sodium. Generally, foods with less than 300 milligrams (mg) of sodium per serving fit into this eating plan. To find whole grains, look for the word "whole" as the first word in the ingredient list. Shopping Buy products labeled as "low-sodium" or "no salt added." Buy fresh foods. Avoid canned foods and pre-made or frozen meals. Cooking Avoid adding salt when cooking. Use salt-free seasonings or herbs instead of table salt or sea salt. Check with your health care provider or pharmacist before using salt substitutes. Do not fry foods. Cook foods using healthy methods such as baking, boiling, grilling, roasting, and broiling instead. Cook with heart-healthy oils, such as olive, canola, avocado, soybean, or sunflower oil. Meal planning  Eat a balanced diet that includes: 4 or more servings of fruits and 4 or more servings of vegetables each day. Try to fill one-half of your plate with fruits and vegetables. 6-8 servings of whole grains each day. Less than 6 oz (170 g) of lean meat, poultry, or fish each day. A 3-oz (85-g) serving of meat is about the same size as a deck of cards. One egg equals 1 oz (28 g). 2-3 servings of low-fat dairy each day. One serving is 1 cup (237 mL). 1 serving of nuts, seeds, or beans 5 times each week. 2-3 servings of heart-healthy fats. Healthy fats called omega-3 fatty acids are found in foods such as walnuts, flaxseeds, fortified milks, and eggs.  These fats are also found in cold-water fish, such as sardines, salmon, and mackerel. Limit how much you eat of: Canned or prepackaged foods. Food that is high in trans fat, such as some fried foods. Food that is high in saturated fat, such as fatty meat. Desserts and other sweets, sugary drinks, and other foods with added sugar. Full-fat dairy products. Do not salt foods before eating. Do not eat more than 4 egg yolks a week. Try to eat at least 2 vegetarian meals a week. Eat more home-cooked food and less restaurant, buffet, and fast food.  Lifestyle When eating at a restaurant, ask that your food be prepared with less salt or no salt, if possible. If you drink alcohol: Limit how much you use to: 0-1 drink a day for women who are not pregnant. 0-2 drinks a day for men. Be aware of how much alcohol is in your drink. In the [place] one drink equals one 12 oz bottle of beer (355 mL), one 5 oz glass of wine (148 mL), or one 1 oz glass of hard liquor (44 mL). General information Avoid eating more than 2,300 mg of salt a day. If you have hypertension, you may need to reduce your sodium intake to 1,500 mg a day. Work with your health care provider to maintain a healthy body weight or to lose weight. Ask what an ideal weight is for you. Get at least 30 minutes of exercise that causes your heart to beat faster (aerobic exercise) most days of the week. Activities may include walking, swimming, or biking. Work with your health care provider   or dietitian to adjust your eating plan to your individual calorie needs. What foods should I eat? Fruits All fresh, dried, or frozen fruit. Canned fruit in natural juice (without addedsugar). Vegetables Fresh or frozen vegetables (raw, steamed, roasted, or grilled). Low-sodium or reduced-sodium tomato and vegetable juice. Low-sodium or reduced-sodium tomatosauce and tomato paste. Low-sodium or reduced-sodium canned vegetables. Grains Whole-grain or  whole-wheat bread. Whole-grain or whole-wheat pasta. Brown rice. Oatmeal. Quinoa. Bulgur. Whole-grain and low-sodium cereals. Pita bread.Low-fat, low-sodium crackers. Whole-wheat flour tortillas. Meats and other proteins Skinless chicken or turkey. Ground chicken or turkey. Pork with fat trimmed off. Fish and seafood. Egg whites. Dried beans, peas, or lentils. Unsalted nuts, nut butters, and seeds. Unsalted canned beans. Lean cuts of beef with fat trimmed off. Low-sodium, lean precooked or cured meat, such as sausages or meatloaves. Dairy Low-fat (1%) or fat-free (skim) milk. Reduced-fat, low-fat, or fat-free cheeses. Nonfat, low-sodium ricotta or cottage cheese. Low-fat or nonfatyogurt. Low-fat, low-sodium cheese. Fats and oils Soft margarine without trans fats. Vegetable oil. Reduced-fat, low-fat, or light mayonnaise and salad dressings (reduced-sodium). Canola, safflower, olive, avocado, soybean, andsunflower oils. Avocado. Seasonings and condiments Herbs. Spices. Seasoning mixes without salt. Other foods Unsalted popcorn and pretzels. Fat-free sweets. The items listed above may not be a complete list of foods and beverages you can eat. Contact a dietitian for more information. What foods should I avoid? Fruits Canned fruit in a light or heavy syrup. Fried fruit. Fruit in cream or buttersauce. Vegetables Creamed or fried vegetables. Vegetables in a cheese sauce. Regular canned vegetables (not low-sodium or reduced-sodium). Regular canned tomato sauce and paste (not low-sodium or reduced-sodium). Regular tomato and vegetable juice(not low-sodium or reduced-sodium). Pickles. Olives. Grains Baked goods made with fat, such as croissants, muffins, or some breads. Drypasta or rice meal packs. Meats and other proteins Fatty cuts of meat. Ribs. Fried meat. Bacon. Bologna, salami, and other precooked or cured meats, such as sausages or meat loaves. Fat from the back of a pig (fatback). Bratwurst.  Salted nuts and seeds. Canned beans with added salt. Canned orsmoked fish. Whole eggs or egg yolks. Chicken or turkey with skin. Dairy Whole or 2% milk, cream, and half-and-half. Whole or full-fat cream cheese. Whole-fat or sweetened yogurt. Full-fat cheese. Nondairy creamers. Whippedtoppings. Processed cheese and cheese spreads. Fats and oils Butter. Stick margarine. Lard. Shortening. Ghee. Bacon fat. Tropical oils, suchas coconut, palm kernel, or palm oil. Seasonings and condiments Onion salt, garlic salt, seasoned salt, table salt, and sea salt. Worcestershire sauce. Tartar sauce. Barbecue sauce. Teriyaki sauce. Soy sauce, including reduced-sodium. Steak sauce. Canned and packaged gravies. Fish sauce. Oyster sauce. Cocktail sauce. Store-bought horseradish. Ketchup. Mustard. Meat flavorings and tenderizers. Bouillon cubes. Hot sauces. Pre-made or packaged marinades. Pre-made or packaged taco seasonings. Relishes. Regular saladdressings. Other foods Salted popcorn and pretzels. The items listed above may not be a complete list of foods and beverages you should avoid. Contact a dietitian for more information. Where to find more information National Heart, Lung, and Blood Institute: www.nhlbi.nih.gov American Heart Association: www.heart.org Academy of Nutrition and Dietetics: www.eatright.org National Kidney Foundation: www.kidney.org Summary The DASH eating plan is a healthy eating plan that has been shown to reduce high blood pressure (hypertension). It may also reduce your risk for type 2 diabetes, heart disease, and stroke. When on the DASH eating plan, aim to eat more fresh fruits and vegetables, whole grains, lean proteins, low-fat dairy, and heart-healthy fats. With the DASH eating plan, you should limit salt (sodium) intake to 2,300   mg a day. If you have hypertension, you may need to reduce your sodium intake to 1,500 mg a day. Work with your health care provider or dietitian to adjust  your eating plan to your individual calorie needs. This information is not intended to replace advice given to you by your health care provider. Make sure you discuss any questions you have with your healthcare provider. Document Revised: 08/30/2019 Document Reviewed: 08/30/2019 Elsevier Patient Education  2022 Elsevier Inc.  

## 2021-05-13 NOTE — Assessment & Plan Note (Signed)
Check labs con't meds 

## 2021-05-13 NOTE — Assessment & Plan Note (Signed)
Per neuro toradol im given

## 2021-05-14 LAB — LIPID PANEL
Cholesterol: 83 mg/dL (ref 0–200)
HDL: 42.1 mg/dL (ref >39.00)
LDL Cholesterol: 30 mg/dL (ref 0–99)
NonHDL: 41.11
Total CHOL/HDL Ratio: 2
Triglycerides: 56 mg/dL (ref 0.0–149.0)
VLDL: 11.2 mg/dL (ref 0.0–40.0)

## 2021-05-14 LAB — COMPREHENSIVE METABOLIC PANEL
ALT: 16 U/L (ref 0–53)
AST: 18 U/L (ref 0–37)
Albumin: 3.8 g/dL (ref 3.5–5.2)
Alkaline Phosphatase: 56 U/L (ref 39–117)
BUN: 27 mg/dL — ABNORMAL HIGH (ref 6–23)
CO2: 27 mEq/L (ref 19–32)
Calcium: 8.9 mg/dL (ref 8.4–10.5)
Chloride: 109 mEq/L (ref 96–112)
Creatinine, Ser: 1.29 mg/dL (ref 0.40–1.50)
GFR: 53.55 mL/min — ABNORMAL LOW (ref >60.00)
Glucose, Bld: 74 mg/dL (ref 70–99)
Potassium: 4.1 mEq/L (ref 3.5–5.1)
Sodium: 141 mEq/L (ref 135–145)
Total Bilirubin: 0.6 mg/dL (ref 0.2–1.2)
Total Protein: 6.1 g/dL (ref 6.0–8.3)

## 2021-05-14 LAB — HEMOGLOBIN A1C: Hgb A1c MFr Bld: 5.4 % (ref 4.6–6.5)

## 2021-05-20 DIAGNOSIS — H906 Mixed conductive and sensorineural hearing loss, bilateral: Secondary | ICD-10-CM | POA: Diagnosis not present

## 2021-05-20 DIAGNOSIS — Z7984 Long term (current) use of oral hypoglycemic drugs: Secondary | ICD-10-CM | POA: Diagnosis not present

## 2021-05-20 DIAGNOSIS — Z974 Presence of external hearing-aid: Secondary | ICD-10-CM | POA: Diagnosis not present

## 2021-05-20 DIAGNOSIS — H6983 Other specified disorders of Eustachian tube, bilateral: Secondary | ICD-10-CM | POA: Diagnosis not present

## 2021-05-20 DIAGNOSIS — E119 Type 2 diabetes mellitus without complications: Secondary | ICD-10-CM | POA: Diagnosis not present

## 2021-05-20 DIAGNOSIS — Z9622 Myringotomy tube(s) status: Secondary | ICD-10-CM | POA: Diagnosis not present

## 2021-05-20 DIAGNOSIS — H7312 Chronic myringitis, left ear: Secondary | ICD-10-CM | POA: Diagnosis not present

## 2021-05-20 DIAGNOSIS — H7012 Chronic mastoiditis, left ear: Secondary | ICD-10-CM | POA: Diagnosis not present

## 2021-05-20 DIAGNOSIS — H903 Sensorineural hearing loss, bilateral: Secondary | ICD-10-CM | POA: Diagnosis not present

## 2021-05-20 DIAGNOSIS — Z9889 Other specified postprocedural states: Secondary | ICD-10-CM | POA: Diagnosis not present

## 2021-05-24 DIAGNOSIS — L821 Other seborrheic keratosis: Secondary | ICD-10-CM | POA: Diagnosis not present

## 2021-05-30 ENCOUNTER — Other Ambulatory Visit: Payer: Self-pay | Admitting: Family Medicine

## 2021-05-30 DIAGNOSIS — IMO0002 Reserved for concepts with insufficient information to code with codable children: Secondary | ICD-10-CM

## 2021-05-30 DIAGNOSIS — E1151 Type 2 diabetes mellitus with diabetic peripheral angiopathy without gangrene: Secondary | ICD-10-CM

## 2021-06-22 DIAGNOSIS — H6093 Unspecified otitis externa, bilateral: Secondary | ICD-10-CM | POA: Diagnosis not present

## 2021-06-22 DIAGNOSIS — Z9622 Myringotomy tube(s) status: Secondary | ICD-10-CM | POA: Diagnosis not present

## 2021-06-22 DIAGNOSIS — H6243 Otitis externa in other diseases classified elsewhere, bilateral: Secondary | ICD-10-CM | POA: Diagnosis not present

## 2021-06-22 DIAGNOSIS — B369 Superficial mycosis, unspecified: Secondary | ICD-10-CM | POA: Diagnosis not present

## 2021-06-22 DIAGNOSIS — Z7984 Long term (current) use of oral hypoglycemic drugs: Secondary | ICD-10-CM | POA: Diagnosis not present

## 2021-06-22 DIAGNOSIS — E119 Type 2 diabetes mellitus without complications: Secondary | ICD-10-CM | POA: Diagnosis not present

## 2021-06-26 ENCOUNTER — Other Ambulatory Visit: Payer: Self-pay | Admitting: Neurology

## 2021-06-26 ENCOUNTER — Other Ambulatory Visit: Payer: Self-pay | Admitting: Family Medicine

## 2021-06-26 ENCOUNTER — Other Ambulatory Visit: Payer: Self-pay | Admitting: Cardiology

## 2021-07-01 DIAGNOSIS — H9212 Otorrhea, left ear: Secondary | ICD-10-CM | POA: Diagnosis not present

## 2021-07-01 DIAGNOSIS — Z9629 Presence of other otological and audiological implants: Secondary | ICD-10-CM | POA: Diagnosis not present

## 2021-07-01 DIAGNOSIS — B369 Superficial mycosis, unspecified: Secondary | ICD-10-CM | POA: Diagnosis not present

## 2021-07-01 DIAGNOSIS — Z9622 Myringotomy tube(s) status: Secondary | ICD-10-CM | POA: Diagnosis not present

## 2021-07-01 DIAGNOSIS — H6243 Otitis externa in other diseases classified elsewhere, bilateral: Secondary | ICD-10-CM | POA: Diagnosis not present

## 2021-07-11 ENCOUNTER — Other Ambulatory Visit: Payer: Self-pay | Admitting: Neurology

## 2021-07-12 NOTE — Progress Notes (Signed)
NEUROLOGY FOLLOW UP OFFICE NOTE  [name] [id number]  Assessment/Plan:   Migraine without aura, without status migrainosus, not intractable Essential tremor Mild neurocognitive disorder  Migraine prevention:  Propranolol ER 142m daily, topiramate 552mat bedtime, Cefaly.  He started himself on Boswellia as well. Essential tremor management:  propranolol ER 16071maily, topiramate 37m17m bedtime Migraine rescue:  Cefaly, Tylenol, Ginger Tea Limit use of pain relievers to no more than 2 days out of week to prevent risk of rebound or medication-overuse headache. Keep headache diary Follow up 9 months   Subjective:  [name] 75 y71r old right-handed male with osteoarthritis, hypertension, type 2 diabetes mellitus, hyperlipidemia and s/p mastoidectomy and tympanic membrane repair who follows up for migraines and essential tremor.   UPDATE: Neuropsychological evaluation on 12/25/2020 showed evidence of mild neurocognitive disorder, however it may be related to medication (topiramate) or migraines but at this time not convincing for a neurodegenerative disease.  He notes that he sometimes may have word-finding issues which can be a problem during conversation.    I  Migraine:  He is doing well.   Migraines usually were occurring 1 to 2 a month.  Started Boswellia a month ago and hasn't had a migraine since. Frequency of abortive medication: infrequent Rescue protocol:  Ginger tea with Tylenol, Cefaly Current NSAIDS:  piroxiicam daily (for arthritis) Current analgesics:  no Current triptans:  no Current ergotamine:  no Current anti-emetic:  no Current muscle relaxants:  no Current anti-anxiolytic:  no Current sleep aide:  no Current Antihypertensive medications:  Losartan-HCTZ, propranolol ER 160mg72mrent Antidepressant medications:  no Current Anticonvulsant medications:  topiramate 37mg 91medtime Current anti-CGRP:  no Current Vitamins/Herbal/Supplements:  Boswellia Extract, Ginger tea, biotin, multivitamin, fish oil, coenzyme Q 10, cinnamon, gingerroot, turmeric 500 mg, magnesium 250 mg, riboflavin 400 mg, butterbur 150 mg Current Antihistamines/Decongestants: Flonase Other therapy: Cephaly (once a day).  Treat acutely with ginger tea and rest.   Caffeine: 2 to 3 cups of coffee daily Alcohol: No Smoker: No Diet: Hydrates.  No soda. Exercise: Yes Depression: Stable; Anxiety: Stable Other pain:  no Sleep hygiene: He wakes up every hour to urinate   II Essential Tremor:  He is taking propranolol ER 160 mg daily and topiramate 37mg d19m.  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      HISTORY: I Migraine:  Onset: In his 20s or 76s.  They would occur once or twice a year.  In September, they started to occur once or twice a week and then almost daily.  Over the past couple of weeks (following tympanostomy tube placement), they have occurred about once a week. Location:  Top of head Quality:  explode Initial Intensity:  10/10 Aura:  Flashing lights/colors in vision Prodrome:  no Postdrome:  Hangover effect for up to 5 days Associated symptoms: Nausea, photophobia, phonophobia.  Denies unilateral numbness and weakness. Initial Duration:  Several hours Initial Frequency:   In September, they started to occur once or twice a week and then almost daily.  Over the past couple of weeks (following tympanostomy tube placement), they have occurred about once a week. Triggers: Emotional stress, sound, NTG, light, wine, eye strain Relieving factors: Sleep Activity:  Needs to lay down   Past NSAIDS:  Celebrex Past analgesics:  no Past abortive triptans:  Sumatriptan (stopped due to CAD). Past muscle relaxants:  no Past anti-emetic:  no Past antihypertensive medications:  none Past antidepressant medications:  Prozac, amitriptyline Past  anticonvulsant medications:  no Past vitamins/Herbal/Supplements:  no Other past therapies:   Biofeedback, relaxation therapy   MRI of brain without contrast from 08/11/16 showed mild chronic small vessel ischemic changes but no acute stroke, bleed or mass lesion.  However, it did reveal mild mastoiditis.  He saw ENT and had tympanostomy tube placement on 09/22/16.  Due to worsening headaches, he had an MRI brain with and without contrast was performed on 01/02/2020 which showed no acute intracranial abnormality. History of double vision.  Had prisms made for his glasses which helped.   II  Essential Tremor: For over 10 years, he has had a tremor, particularly in his right hand.  It has become more noticeable.  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  There is no known family history.  PAST MEDICAL HISTORY: Past Medical History:  Diagnosis Date   Abdominal bruit 07/22/2010   Acute sinusitis 03/26/2015   Acute suppr otitis media w spon rupt ear drum, recur, r ear 04/06/2017   Angina pectoris 12/11/2008   Benign essential tremor 08/29/2017   Bilateral sensorineural hearing loss 11/30/2015   CAD S/P percutaneous coronary angioplasty 12/11/2008   LAD PCI '03,  Cath 2010 showed 40% LAD- medical Rx Myoview March 2020 negative for ischemia but positive for ST depression- Imdur added.  Formatting of this note might be different from the original. LAD PCI '03,  Cath 2010 showed 40% LAD- medical Rx Myoview March 2020 negative for ischemia but positive for ST depression- Imdur added.  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 Echo May 2020- EF 60-65% with mild LVH  Last Assessment & Plan:  Well controlled, no changes to meds. Encouraged heart healthy diet such as the DASH diet and exercise as tolerated.   Eustachian tube dysfunction, bilateral 11/30/2015   History of colonic polyps 07/12/2010   History of GI bleed 04/23/2020   History of left mastoidectomy 02/20/2020   History of placement of ear tubes    Hyperlipidemia associated with type 2 diabetes mellitus 07/10/2007   Hyperthyroidism 07/12/2010   Hypothyroidism 07/10/2007   Internal hemorrhoids    Intractable episodic cluster headache 08/29/2017   Major depressive disorder 07/10/2007   Mastoiditis of both sides 09/21/2016   Migraine headache    Mild neurocognitive disorder due to multiple etiologies 12/25/2020   Non-insulin dependent type 2 diabetes mellitus 07/06/2010   Osteoarthritis of multiple joints 07/10/2007   Perennial allergic rhinitis 09/22/2016   Postprocedural urinary retention    Presbycusis of both ears 01/23/2018   Skin cancer, basal cell    Thrombocytopenia    TIA (transient ischemic attack) 2006   per patient's report. He was never officially given this diagnosis   Upper airway cough syndrome 09/25/2017    MEDICATIONS: Current Outpatient Medications on File Prior to Visit  Medication Sig Dispense Refill   aspirin 81 MG tablet Take 81 mg by mouth daily.     atorvastatin (LIPITOR) 40 MG tablet TAKE ONE TABLET BY MOUTH DAILY  AT 6PM 90 tablet 3   azelastine (ASTELIN) 0.1 % nasal spray Place 1 spray into both nostrils daily as needed for rhinitis. Use in each nostril as directed     Biotin 1000 MCG tablet Take 1,000 mcg by mouth 2 (two) times daily.     celecoxib (CELEBREX) 200 MG capsule Take 1 capsule (200 mg total) by mouth daily. 90 capsule 3   cholecalciferol (VITAMIN D) 25 MCG (1000 UNIT) tablet Take 3,000 Units by mouth 3 (three) times daily.     Cinnamon 500 MG capsule Take 1,000 mg by mouth 2 (two)  times daily.     Coenzyme Q10 (COQ-10) 100 MG capsule Take 1 capsule (100 mg total) by mouth daily.     Cyanocobalamin (VITAMIN B-12) 1000 MCG SUBL Take 1,000 mcg by mouth daily.     Diclofenac Sodium 3 % GEL Qid prn (Patient taking differently: Apply 1 application topically daily as needed (Arthritis).) 100 g 3   ezetimibe (ZETIA) 10 MG tablet Take 1 tablet (10 mg total) by mouth daily. 90 tablet 3   ferrous sulfate 325 (65 FE) MG tablet Take 325 mg by mouth daily with breakfast.     Ginger, Zingiber officinalis, (GINGER ROOT) 550 MG CAPS Take 550 mg by mouth 2 (two) times daily.     levothyroxine (SYNTHROID) 112 MCG tablet Take 1 tablet (112 mcg total) by mouth daily before breakfast. 90 tablet 1   losartan (COZAAR) 100 MG tablet Take 1 tablet (100 mg total) by mouth daily. 90 tablet 3   metFORMIN (GLUCOPHAGE) 500 MG tablet TAKE ONE TABLET BY MOUTH TWICE A DAY WITH A MEAL 180 tablet 1   Multiple Vitamins-Minerals (THERAGRAN-M ADVANCED 50 PLUS PO) Take 1 tablet by mouth daily.     Nerve Stimulator (CEFALY KIT) DEVI Apply 1 application topically daily at 12 noon. Electrical migraine kit for prevention on treatment of migraine headache     OVER THE COUNTER MEDICATION Take 1 capsule by mouth 2 (two) times daily. Migra-eeze gel cap     pantoprazole (PROTONIX) 40 MG tablet TAKE ONE TABLET BY MOUTH DAILY 90 tablet 1   propranolol ER (INDERAL LA) 160 MG SR capsule TAKE ONE CAPSULE BY MOUTH DAILY 16 capsule 0   tamsulosin (FLOMAX) 0.4 MG CAPS capsule Take 0.4 mg by mouth in the morning and at bedtime. 30 capsule    topiramate (TOPAMAX) 50 MG tablet TAKE ONE TABLET BY MOUTH AT BEDTIME (Patient taking differently: Take 50 mg by mouth daily.) 30 tablet 9   triamcinolone (KENALOG) 0.1 % Apply 1 application topically daily.     No current facility-administered medications on file prior to visit.    ALLERGIES: Allergies  Allergen Reactions   Sulfa Antibiotics Other (See Comments)    Severe crippling   joint and muscle pain and redness   Sulfamethoxazole-Trimethoprim Other (See Comments)    Joint and muscle pain   Sulfasalazine Other (See Comments)   Codeine Other (See Comments)    Unknown   Crestor [Rosuvastatin Calcium] Other (See Comments)    Severe joint and muscle pain   Hydrocodone-Acetaminophen Other (See Comments)    To strong vicodin    Other     Apples and tomatoes...... Nausea and migraines.     Rosuvastatin     REACTION: REALLY BAD JOINT/MUSCLE PAIN    FAMILY HISTORY: Family History  Problem Relation Age of Onset   Heart disease Mother    Alzheimer's disease Mother    Heart disease Father  triple by pass   Hypertension Brother        Prostate Cancer   Prostate cancer Brother    Hypertension Brother    Hypertension Brother    COPD Brother    Colon cancer Neg Hx    Colon polyps Neg Hx    Rectal cancer Neg Hx    Stomach cancer Neg Hx    Esophageal cancer Neg Hx       Objective:  Blood pressure 135/73, pulse 64, height '5\' 9"'  (1.753 m), weight 152 lb 3.2 oz (69 kg), SpO2 98 %. General: No acute distress.  Patient appears well-groomed.   Head:  Normocephalic/atraumatic Eyes:  Fundi examined but not visualized Neck: supple, no paraspinal tenderness, full range of motion Heart:  Regular rate and rhythm Lungs:  Clear to auscultation bilaterally Back: No paraspinal tenderness Neurological Exam: alert and oriented to person, place, and time.  Speech fluent and not dysarthric, language intact.  Finger to nose testing intact.     Metta Clines, DO  CC: Roma Schanz, DO

## 2021-07-13 ENCOUNTER — Encounter: Payer: Self-pay | Admitting: Neurology

## 2021-07-13 ENCOUNTER — Other Ambulatory Visit: Payer: Self-pay

## 2021-07-13 ENCOUNTER — Ambulatory Visit (INDEPENDENT_AMBULATORY_CARE_PROVIDER_SITE_OTHER): Payer: Medicare Other | Admitting: Neurology

## 2021-07-13 VITALS — BP 135/73 | HR 64 | Ht 69.0 in | Wt 152.2 lb

## 2021-07-13 DIAGNOSIS — F0670 Mild neurocognitive disorder due to known physiological condition without behavioral disturbance: Secondary | ICD-10-CM

## 2021-07-13 DIAGNOSIS — I2510 Atherosclerotic heart disease of native coronary artery without angina pectoris: Secondary | ICD-10-CM | POA: Diagnosis not present

## 2021-07-13 DIAGNOSIS — G250 Essential tremor: Secondary | ICD-10-CM

## 2021-07-13 DIAGNOSIS — G43109 Migraine with aura, not intractable, without status migrainosus: Secondary | ICD-10-CM

## 2021-07-13 DIAGNOSIS — Z9861 Coronary angioplasty status: Secondary | ICD-10-CM

## 2021-07-13 MED ORDER — PROPRANOLOL HCL ER 160 MG PO CP24: 160.0000 mg | ORAL_CAPSULE | Freq: Every day | ORAL | 1 refills | Status: DC

## 2021-07-13 NOTE — Patient Instructions (Signed)
Continue topiramate 50mg  at bedtime, propranolol ER 160mg  daily

## 2021-07-15 DIAGNOSIS — H9212 Otorrhea, left ear: Secondary | ICD-10-CM | POA: Diagnosis not present

## 2021-07-15 DIAGNOSIS — H7012 Chronic mastoiditis, left ear: Secondary | ICD-10-CM | POA: Diagnosis not present

## 2021-07-15 DIAGNOSIS — Z9622 Myringotomy tube(s) status: Secondary | ICD-10-CM | POA: Diagnosis not present

## 2021-07-15 DIAGNOSIS — H903 Sensorineural hearing loss, bilateral: Secondary | ICD-10-CM | POA: Diagnosis not present

## 2021-07-26 ENCOUNTER — Other Ambulatory Visit (HOSPITAL_BASED_OUTPATIENT_CLINIC_OR_DEPARTMENT_OTHER): Payer: Self-pay

## 2021-07-26 ENCOUNTER — Other Ambulatory Visit: Payer: Self-pay

## 2021-07-26 ENCOUNTER — Ambulatory Visit: Payer: Medicare Other | Attending: Internal Medicine

## 2021-07-26 DIAGNOSIS — Z23 Encounter for immunization: Secondary | ICD-10-CM

## 2021-07-26 MED ORDER — PFIZER COVID-19 VAC BIVALENT 30 MCG/0.3ML IM SUSP
INTRAMUSCULAR | 0 refills | Status: DC
  Filled 2021-07-26: qty 0.3, 1d supply, fill #0

## 2021-07-26 NOTE — Progress Notes (Signed)
   [facility]ov[facility] Vaccination [facility]  Name:  [name]    MRN: [id number] DOB: 27-Sep-1946  07/26/2021  [name] was observed post Covid-19 immunization for 15 minutes without incident. He was provided with Vaccine Information Sheet and instruction to access the V-Safe system.   [name] was instructed to call 911 with any severe reactions post vaccine: Difficulty breathing  Swelling of face and throat  A fast heartbeat  A bad rash all over body  Dizziness and weakness

## 2021-08-02 DIAGNOSIS — Z23 Encounter for immunization: Secondary | ICD-10-CM | POA: Diagnosis not present

## 2021-08-25 ENCOUNTER — Other Ambulatory Visit: Payer: Self-pay | Admitting: Neurology

## 2021-09-10 LAB — HM DIABETES EYE EXAM

## 2021-09-14 DIAGNOSIS — R972 Elevated prostate specific antigen [PSA]: Secondary | ICD-10-CM | POA: Diagnosis not present

## 2021-09-16 DIAGNOSIS — Z9889 Other specified postprocedural states: Secondary | ICD-10-CM | POA: Diagnosis not present

## 2021-09-16 DIAGNOSIS — H60312 Diffuse otitis externa, left ear: Secondary | ICD-10-CM | POA: Diagnosis not present

## 2021-09-16 DIAGNOSIS — H6983 Other specified disorders of Eustachian tube, bilateral: Secondary | ICD-10-CM | POA: Diagnosis not present

## 2021-09-16 DIAGNOSIS — Z9622 Myringotomy tube(s) status: Secondary | ICD-10-CM | POA: Diagnosis not present

## 2021-09-20 DIAGNOSIS — Z08 Encounter for follow-up examination after completed treatment for malignant neoplasm: Secondary | ICD-10-CM | POA: Diagnosis not present

## 2021-09-20 DIAGNOSIS — L309 Dermatitis, unspecified: Secondary | ICD-10-CM | POA: Diagnosis not present

## 2021-09-20 DIAGNOSIS — L570 Actinic keratosis: Secondary | ICD-10-CM | POA: Diagnosis not present

## 2021-09-20 DIAGNOSIS — E119 Type 2 diabetes mellitus without complications: Secondary | ICD-10-CM | POA: Diagnosis not present

## 2021-09-20 DIAGNOSIS — H35372 Puckering of macula, left eye: Secondary | ICD-10-CM | POA: Diagnosis not present

## 2021-09-20 DIAGNOSIS — L821 Other seborrheic keratosis: Secondary | ICD-10-CM | POA: Diagnosis not present

## 2021-09-20 DIAGNOSIS — Z85828 Personal history of other malignant neoplasm of skin: Secondary | ICD-10-CM | POA: Diagnosis not present

## 2021-09-20 DIAGNOSIS — H2513 Age-related nuclear cataract, bilateral: Secondary | ICD-10-CM | POA: Diagnosis not present

## 2021-09-20 DIAGNOSIS — D225 Melanocytic nevi of trunk: Secondary | ICD-10-CM | POA: Diagnosis not present

## 2021-09-20 DIAGNOSIS — H40011 Open angle with borderline findings, low risk, right eye: Secondary | ICD-10-CM | POA: Diagnosis not present

## 2021-09-20 DIAGNOSIS — L814 Other melanin hyperpigmentation: Secondary | ICD-10-CM | POA: Diagnosis not present

## 2021-09-20 DIAGNOSIS — Z85820 Personal history of malignant melanoma of skin: Secondary | ICD-10-CM | POA: Diagnosis not present

## 2021-09-21 DIAGNOSIS — R3912 Poor urinary stream: Secondary | ICD-10-CM | POA: Diagnosis not present

## 2021-09-21 DIAGNOSIS — R972 Elevated prostate specific antigen [PSA]: Secondary | ICD-10-CM | POA: Diagnosis not present

## 2021-09-21 DIAGNOSIS — N401 Enlarged prostate with lower urinary tract symptoms: Secondary | ICD-10-CM | POA: Diagnosis not present

## 2021-09-21 DIAGNOSIS — N3000 Acute cystitis without hematuria: Secondary | ICD-10-CM | POA: Diagnosis not present

## 2021-10-10 ENCOUNTER — Encounter: Payer: Self-pay | Admitting: Family Medicine

## 2021-10-10 DIAGNOSIS — E039 Hypothyroidism, unspecified: Secondary | ICD-10-CM

## 2021-10-12 MED ORDER — LEVOTHYROXINE SODIUM 112 MCG PO TABS
112.0000 ug | ORAL_TABLET | Freq: Every day | ORAL | 1 refills | Status: DC
Start: 2021-10-12 — End: 2022-04-11

## 2021-11-08 ENCOUNTER — Ambulatory Visit (INDEPENDENT_AMBULATORY_CARE_PROVIDER_SITE_OTHER): Payer: Medicare Other

## 2021-11-08 VITALS — BP 152/86 | HR 62 | Temp 98.4°F | Resp 16 | Ht 69.0 in | Wt 155.0 lb

## 2021-11-08 DIAGNOSIS — Z0000 Encounter for general adult medical examination without abnormal findings: Secondary | ICD-10-CM | POA: Diagnosis not present

## 2021-11-08 NOTE — Patient Instructions (Signed)
[name] , Thank you for taking time to come for your Medicare Wellness Visit. I appreciate your ongoing commitment to your health goals. Please review the following plan we discussed and let me know if I can assist you in the future.   Screening recommendations/referrals: Colonoscopy: Completed 07/09/2019-Due 07/08/2024 Recommended yearly ophthalmology/optometry visit for glaucoma screening and checkup Recommended yearly dental visit for hygiene and checkup  Vaccinations: Influenza vaccine: Up to date Pneumococcal vaccine: Up to date Tdap vaccine: Up to date Shingles vaccine: Completed vaccines   Covid-19: Up to date  Advanced directives: Copy in chart  Conditions/risks identified: See problem list  Next appointment: Follow up in one year for your annual wellness visit. 11/10/2022 @ 3:00.  Preventive Care 56 Years and Older, Male Preventive care refers to lifestyle choices and visits with your health care provider that can promote health and wellness. What does preventive care include? A yearly physical exam. This is also called an annual well check. Dental exams once or twice a year. Routine eye exams. Ask your health care provider how often you should have your eyes checked. Personal lifestyle choices, including: Daily care of your teeth and gums. Regular physical activity. Eating a healthy diet. Avoiding tobacco and drug use. Limiting alcohol use. Practicing safe sex. Taking low doses of aspirin every day. Taking vitamin and mineral supplements as recommended by your health care provider. What happens during an annual well check? The services and screenings done by your health care provider during your annual well check will depend on your age, overall health, lifestyle risk factors, and family history of disease. Counseling  Your health care provider may ask you questions about your: Alcohol use. Tobacco use. Drug use. Emotional well-being. Home and relationship  well-being. Sexual activity. Eating habits. History of falls. Memory and ability to understand (cognition). Work and work Statistician. Screening  You may have the following tests or measurements: Height, weight, and BMI. Blood pressure. Lipid and cholesterol levels. These may be checked every 5 years, or more frequently if you are over 50 years old. Skin check. Lung cancer screening. You may have this screening every year starting at age 43 if you have a 30-pack-year history of smoking and currently smoke or have quit within the past 15 years. Fecal occult blood test (FOBT) of the stool. You may have this test every year starting at age 64. Flexible sigmoidoscopy or colonoscopy. You may have a sigmoidoscopy every 5 years or a colonoscopy every 10 years starting at age 46. Prostate cancer screening. Recommendations will vary depending on your family history and other risks. Hepatitis C blood test. Hepatitis B blood test. Sexually transmitted disease (STD) testing. Diabetes screening. This is done by checking your blood sugar (glucose) after you have not eaten for a while (fasting). You may have this done every 1-3 years. Abdominal aortic aneurysm (AAA) screening. You may need this if you are a current or former smoker. Osteoporosis. You may be screened starting at age 69 if you are at high risk. Talk with your health care provider about your test results, treatment options, and if necessary, the need for more tests. Vaccines  Your health care provider may recommend certain vaccines, such as: Influenza vaccine. This is recommended every year. Tetanus, diphtheria, and acellular pertussis (Tdap, Td) vaccine. You may need a Td booster every 10 years. Zoster vaccine. You may need this after age 63. Pneumococcal 13-valent conjugate (PCV13) vaccine. One dose is recommended after age 68. Pneumococcal polysaccharide (PPSV23) vaccine. One dose  is recommended after age 83. Talk to your health care  provider about which screenings and vaccines you need and how often you need them. This information is not intended to replace advice given to you by your health care provider. Make sure you discuss any questions you have with your health care provider. Document Released: 10/23/2015 Document Revised: 06/15/2016 Document Reviewed: 07/28/2015 Elsevier Interactive Patient Education  2017 [place] Prevention in the Home Falls can cause injuries. They can happen to people of all ages. There are many things you can do to make your home safe and to help prevent falls. What can I do on the outside of my home? Regularly fix the edges of walkways and driveways and fix any cracks. Remove anything that might make you trip as you walk through a door, such as a raised step or threshold. Trim any bushes or trees on the path to your home. Use bright outdoor lighting. Clear any walking paths of anything that might make someone trip, such as rocks or tools. Regularly check to see if handrails are loose or broken. Make sure that both sides of any steps have handrails. Any raised decks and porches should have guardrails on the edges. Have any leaves, snow, or ice cleared regularly. Use sand or salt on walking paths during winter. Clean up any spills in your garage right away. This includes oil or grease spills. What can I do in the bathroom? Use night lights. Install grab bars by the toilet and in the tub and shower. Do not use towel bars as grab bars. Use non-skid mats or decals in the tub or shower. If you need to sit down in the shower, use a plastic, non-slip stool. Keep the floor dry. Clean up any water that spills on the floor as soon as it happens. Remove soap buildup in the tub or shower regularly. Attach bath mats securely with double-sided non-slip rug tape. Do not have throw rugs and other things on the floor that can make you trip. What can I do in the bedroom? Use night lights. Make  sure that you have a light by your bed that is easy to reach. Do not use any sheets or blankets that are too big for your bed. They should not hang down onto the floor. Have a firm chair that has side arms. You can use this for support while you get dressed. Do not have throw rugs and other things on the floor that can make you trip. What can I do in the kitchen? Clean up any spills right away. Avoid walking on wet floors. Keep items that you use a lot in easy-to-reach places. If you need to reach something above you, use a strong step stool that has a grab bar. Keep electrical cords out of the way. Do not use floor polish or wax that makes floors slippery. If you must use wax, use non-skid floor wax. Do not have throw rugs and other things on the floor that can make you trip. What can I do with my stairs? Do not leave any items on the stairs. Make sure that there are handrails on both sides of the stairs and use them. Fix handrails that are broken or loose. Make sure that handrails are as long as the stairways. Check any carpeting to make sure that it is firmly attached to the stairs. Fix any carpet that is loose or worn. Avoid having throw rugs at the top or bottom of the stairs.  If you do have throw rugs, attach them to the floor with carpet tape. Make sure that you have a light switch at the top of the stairs and the bottom of the stairs. If you do not have them, ask someone to add them for you. What else can I do to help prevent falls? Wear shoes that: Do not have high heels. Have rubber bottoms. Are comfortable and fit you well. Are closed at the toe. Do not wear sandals. If you use a stepladder: Make sure that it is fully opened. Do not climb a closed stepladder. Make sure that both sides of the stepladder are locked into place. Ask someone to hold it for you, if possible. Clearly mark and make sure that you can see: Any grab bars or handrails. First and last steps. Where the  edge of each step is. Use tools that help you move around (mobility aids) if they are needed. These include: Canes. Walkers. Scooters. Crutches. Turn on the lights when you go into a dark area. Replace any light bulbs as soon as they burn out. Set up your furniture so you have a clear path. Avoid moving your furniture around. If any of your floors are uneven, fix them. If there are any pets around you, be aware of where they are. Review your medicines with your doctor. Some medicines can make you feel dizzy. This can increase your chance of falling. Ask your doctor what other things that you can do to help prevent falls. This information is not intended to replace advice given to you by your health care provider. Make sure you discuss any questions you have with your health care provider. Document Released: 07/23/2009 Document Revised: 03/03/2016 Document Reviewed: 10/31/2014 Elsevier Interactive Patient Education  2017 [employer].

## 2021-11-08 NOTE — Progress Notes (Signed)
Subjective:   [name] is a 76 y.o. male who presents for Medicare Annual/Subsequent preventive examination.  Review of Systems     Cardiac Risk Factors include: advanced age (>63mn, >>47women);hypertension;diabetes mellitus;dyslipidemia;male gender     Objective:    Today's Vitals   11/08/21 1418  BP: (!) 152/86  Pulse: 62  Resp: 16  Temp: 98.4 F (36.9 C)  TempSrc: Oral  SpO2: 98%  Weight: 155 lb (70.3 kg)  Height: 5' 9" (1.753 m)   Body mass index is 22.89 kg/m.  Advanced Directives 11/08/2021 12/08/2020 12/02/2020 12/01/2020 11/17/2020 11/12/2020 11/05/2020  Does Patient Have a Medical Advance Directive? _0  Yes Yes  Type of AParamedicof APumpkin CenterLiving will HOverlyLiving will HK[place]villeLiving will HJamestownLiving will HLimavilleLiving will HShorewood ForestLiving will HLansdaleLiving will  Does patient want to make changes to medical advance directive? - No - Patient declined No - Patient declined No - Patient declined No - Patient declined - -  Copy of HSt. Bernicein Chart? Yes - validated most recent copy scanned in chart (See row information) Yes - validated most recent copy scanned in chart (See row information) Yes - validated most recent copy scanned in chart (See row information) - Yes - validated most recent copy scanned in chart (See row information) No - copy requested Yes - validated most recent copy scanned in chart (See row information)    Current Medications (verified) Outpatient Encounter Medications as of 11/08/2021  Medication Sig   aspirin 81 MG tablet Take 81 mg by mouth daily.   azelastine (ASTELIN) 0.1 % nasal spray Place 1 spray into both nostrils daily as needed for rhinitis. Use in each nostril as directed   Biotin 1000 MCG tablet Take 1,000 mcg by mouth 2 (two) times daily.    Boswellia Serrata (BOSWELLIA PO) Take by mouth.   celecoxib (CELEBREX) 200 MG capsule Take 1 capsule (200 mg total) by mouth daily.   cholecalciferol (VITAMIN D) 25 MCG (1000 UNIT) tablet Take 3,000 Units by mouth 3 (three) times daily.   Cinnamon 500 MG capsule Take 1,000 mg by mouth 2 (two) times daily.   Coenzyme Q10 (COQ-10) 100 MG capsule Take 1 capsule (100 mg total) by mouth daily.   COVID-19 mRNA bivalent vaccine, Pfizer, (PFIZER COVID-19 VAC BIVALENT) injection Inject into the muscle.   Cyanocobalamin (VITAMIN B-12) 1000 MCG SUBL Take 1,000 mcg by mouth daily.   Diclofenac Sodium 3 % GEL Qid prn (Patient taking differently: Apply 1 application topically daily as needed (Arthritis).)   ezetimibe (ZETIA) 10 MG tablet Take 1 tablet (10 mg total) by mouth daily.   ferrous sulfate 325 (65 FE) MG tablet Take 325 mg by mouth daily with breakfast.   Ginger, Zingiber officinalis, (GINGER ROOT) 550 MG CAPS Take 550 mg by mouth 2 (two) times daily.   levothyroxine (SYNTHROID) 112 MCG tablet Take 1 tablet (112 mcg total) by mouth daily before breakfast.   losartan (COZAAR) 100 MG tablet Take 1 tablet (100 mg total) by mouth daily.   metFORMIN (GLUCOPHAGE) 500 MG tablet TAKE ONE TABLET BY MOUTH TWICE A DAY WITH A MEAL   Multiple Vitamins-Minerals (THERAGRAN-M ADVANCED 50 PLUS PO) Take 1 tablet by mouth daily.   Nerve Stimulator (CEFALY KIT) DEVI Apply 1 application topically daily at 12 noon. Electrical migraine kit for prevention on treatment of  migraine headache   OVER THE COUNTER MEDICATION Take 1 capsule by mouth 2 (two) times daily. Migra-eeze gel cap   pantoprazole (PROTONIX) 40 MG tablet TAKE ONE TABLET BY MOUTH DAILY   propranolol ER (INDERAL LA) 160 MG SR capsule Take 1 capsule (160 mg total) by mouth daily. One tablet po qd   tamsulosin (FLOMAX) 0.4 MG CAPS capsule Take 0.4 mg by mouth in the morning and at bedtime.   topiramate (TOPAMAX) 50 MG tablet TAKE ONE TABLET BY MOUTH EVERY NIGHT AT  BEDTIME   triamcinolone (KENALOG) 0.1 % Apply 1 application topically daily.   atorvastatin (LIPITOR) 40 MG tablet TAKE ONE TABLET BY MOUTH DAILY AT 6PM (Patient not taking: Reported on 11/08/2021)   No facility-administered encounter medications on file as of 11/08/2021.    Allergies (verified) Sulfa antibiotics, Sulfamethoxazole-trimethoprim, Sulfasalazine, Codeine, Crestor [rosuvastatin calcium], Hydrocodone-acetaminophen, Other, and Rosuvastatin   History: Past Medical History:  Diagnosis Date   Abdominal bruit 07/22/2010   Acute sinusitis 03/26/2015   Acute suppr otitis media w spon rupt ear drum, recur, r ear 04/06/2017   Angina pectoris 12/11/2008   Benign essential tremor 08/29/2017   Bilateral sensorineural hearing loss 11/30/2015   CAD S/P percutaneous coronary angioplasty 12/11/2008   LAD PCI '03,  Cath 2010 showed 40% LAD- medical Rx Myoview March 2020 negative for ischemia but positive for ST depression- Imdur added.  Formatting of this note might be different from the original. LAD PCI '03,  Cath 2010 showed 40% LAD- medical Rx Myoview March 2020 negative for ischemia but positive for ST depression- Imdur added.  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 Echo May 2020- EF 60-65% with mild LVH  Last Assessment & Plan:  Well controlled, no changes to meds. Encouraged heart healthy diet such as the DASH diet and exercise as tolerated.   Eustachian tube dysfunction, bilateral 11/30/2015   History of colonic  polyps 07/12/2010   History of GI bleed 04/23/2020   History of left mastoidectomy 02/20/2020   History of placement of ear tubes    Hyperlipidemia associated with type 2 diabetes mellitus 07/10/2007   Hyperthyroidism 07/12/2010   Hypothyroidism 07/10/2007   Internal hemorrhoids    Intractable episodic cluster headache 08/29/2017   Major depressive disorder 07/10/2007   Mastoiditis of both sides 09/21/2016   Migraine headache    Mild neurocognitive disorder due to multiple etiologies 12/25/2020   Non-insulin dependent type 2 diabetes mellitus 07/06/2010   Osteoarthritis of multiple joints 07/10/2007   Perennial allergic rhinitis 09/22/2016   Postprocedural urinary retention    Presbycusis of both ears 01/23/2018   Skin cancer, basal cell    Thrombocytopenia    TIA (transient ischemic attack) 2006   per patient's report. He was never officially given this diagnosis   Upper airway cough syndrome 09/25/2017   Past Surgical History:  Procedure Laterality Date   COLONOSCOPY  2011   coronary artery disease status post placement     of drug-eluting stent in the LAD in 2003,eEF 65%  then   [place] N/A 12/08/2020   Procedure: CLOSURE LEFT POST AURICULAR SCALP WOUND;  Surgeon: Irene Limbo, MD;  Location: [place];  Service: Plastics;  Laterality: N/A;   esophogeal dilation     fatty tissue removed     from neck 2003   HERNIA REPAIR  2007   mastoidectomy with tympanoplasty     MELANOMA EXCISION Left 11/17/2020   Procedure: WIDE LOCAL EXCISION, ADVANCED FLAP CLOSURE LEFT POSTERIOR EAR MELANOMA;  Surgeon: Stark Klein, MD;  Location: [place];  Service: General;  Laterality: Left;   MELANOMA EXCISION Left 12/01/2020   Procedure: RE-EXCISION LEFT POSTERIOR AURICULAR MELANOMA WITH TEMPORARY CLOSURE;  Surgeon: Stark Klein, MD;  Location: [place];  Service: General;  Laterality: Left;  60 MIN TOTAL   POLYPECTOMY  2011   +TA   right toe  bone spur surgery     SKIN FULL THICKNESS GRAFT Left 12/08/2020   Procedure: Full thickness skin graft from left upper arm;  Surgeon: Irene Limbo, MD;  Location: [place];  Service: Plastics;  Laterality: Left;   THYROIDECTOMY, PARTIAL  07/2002   right thyroid   TONSILLECTOMY     UPPER GI ENDOSCOPY     Family History  Problem Relation Age of Onset   Heart disease Mother    Alzheimer's disease Mother    Heart disease Father        triple by pass   Hypertension Brother        Prostate Cancer   Prostate cancer Brother    Hypertension Brother    Hypertension Brother    COPD Brother    Colon cancer Neg Hx    Colon polyps Neg Hx    Rectal cancer Neg Hx    Stomach cancer Neg Hx    Esophageal cancer Neg Hx    Social History   Socioeconomic History   Marital status: Married    Spouse name: Charleen   Number of children: 0   Years of education: 14   Highest education level: Associate degree: occupational, Hotel manager, or vocational program  Occupational History   Occupation: retired    Fish farm manager: RETIRED    Comment: accountant  Tobacco Use   Smoking status: Former    Years: 5.00    Types: Cigarettes    Quit date: 10/10/1965    Years since quitting: 56.1   Smokeless tobacco: Never  Vaping Use   Vaping Use: Never used  Substance and Sexual Activity   Alcohol use: No   Drug use: No   Sexual activity: Yes    Partners: Female  Other Topics Concern   Not on file  Social History Narrative   Pt lives with spouse at private home in 2 story home- he has no children- right handed- he drinks coffee daily, tea sometimes, soda not all the time. Exercise-- treadmill, stationary bike   Right handed   Social Determinants of Health   Financial Resource Strain: Low Risk    Difficulty of Paying Living Expenses: Not hard at all  Food Insecurity: No Food Insecurity   Worried About Charity fundraiser in the Last Year: Never true   Ran Out of Food in the Last Year:  Never true  Transportation Needs: No Transportation Needs   Lack of Transportation (Medical): No   Lack of Transportation (Non-Medical): No  Physical Activity: Sufficiently Active   Days of Exercise per Week: 7 days   Minutes of Exercise per Session:  30 min  Stress: No Stress Concern Present   Feeling of Stress : Not at all  Social Connections: Moderately Integrated   Frequency of Communication with Friends and Family: Three times a week   Frequency of Social Gatherings with Friends and Family: More than three times a week   Attends Religious Services: More than 4 times per year   Active Member of [employer] or Organizations: No   Attends Music therapist: Never   Marital Status: Married    Tobacco Counseling Counseling given: Not Answered   Clinical Intake:  Pre-visit preparation completed: Yes  Pain : No/denies pain     BMI - recorded: 22.89 Nutritional Status: BMI of 19-24  Normal Nutritional Risks: Unintentional weight loss Diabetes: Yes CBG done?: No Did pt. bring in CBG monitor from home?: No  How often do you need to have someone help you when you read instructions, pamphlets, or other written materials from your doctor or pharmacy?: 1 - Never  Diabetes:  Is the patient diabetic?  Yes  If diabetic, was a CBG obtained today?  No  Did the patient bring in their glucometer from home?  No  How often do you monitor your CBG's? occasionally.   Financial Strains and Diabetes Management:  Are you having any financial strains with the device, your supplies or your medication? No .  Does the patient want to be seen by Chronic Care Management for management of their diabetes?  No  Would the patient like to be referred to a Nutritionist or for Diabetic Management?  No   Diabetic Exams:  Diabetic Eye Exam: Completed 09/10/2021.  Diabetic Foot Exam: Completed 05/13/2021.   Interpreter Needed?: No  Information entered by :: Caroleen Hamman LPN   Activities  of Daily Living In your present state of health, do you have any difficulty performing the following activities: 11/08/2021 12/08/2020  Hearing? N N  Comment bilaeral hearing aids -  Vision? N N  Difficulty concentrating or making decisions? Y N  Comment occasionally -  Walking or climbing stairs? N N  Dressing or bathing? N N  Doing errands, shopping? N -  Preparing Food and eating ? N -  Using the Toilet? N -  In the past six months, have you accidently leaked urine? Y -  Comment sees urology -  Do you have problems with loss of bowel control? N -  Managing your Medications? N -  Managing your Finances? N -  Housekeeping or managing your Housekeeping? N -  Some recent data might be hidden    Patient Care Team: Carollee Herter, Alferd Apa, DO as PCP - General Stanford Breed Denice Bors, MD as PCP - Cardiology (Cardiology) Stanford Breed Denice Bors, MD as Consulting Physician (Cardiology) Raynelle Bring, MD as Consulting Physician (Urology) Melissa Montane, MD as Consulting Physician (Otolaryngology) Ladene Artist, MD as Consulting Physician (Gastroenterology) Luberta Mutter, MD as Consulting Physician (Ophthalmology) Pieter Partridge, DO as Consulting Physician (Neurology) Macario Carls, MD as Referring Physician (Dermatology) Earlie Server, MD as Consulting Physician (Orthopedic Surgery) Almedia Balls, MD as Referring Physician (Orthopedic Surgery)  Indicate any recent Medical Services you may have received from other than [facility] providers in the past year (date may be approximate).     Assessment:   This is a routine wellness examination for Mekhi.  Hearing/Vision screen Hearing Screening - Comments:: Bilateral hearing aids Vision Screening - Comments:: Last eye exam-09/2021  Dietary issues and exercise activities discussed: Current Exercise Habits: Home exercise routine, Type  of exercise: stretching;strength training/weights (yardwork), Time (Minutes): 30, Frequency (Times/Week): 7, Weekly  Exercise (Minutes/Week): 210, Intensity: Mild, Exercise limited by: None identified   Goals Addressed             This Visit's Progress    Patient Stated   On track    Maintain current healthy lifestyle       Depression Screen PHQ 2/9 Scores 11/08/2021 11/05/2020 10/06/2016 09/24/2015 09/09/2014 08/27/2013 08/27/2013  PHQ - 2 Score 1 0 6 0 0 0 0  PHQ- 9 Score - - 9 - - - -    Fall Risk Fall Risk  11/08/2021 11/05/2020 10/13/2020 04/09/2020 08/08/2019  Falls in the past year? 0 0 0 0 0  Number falls in past yr: 0 0 0 0 0  Injury with Fall? 0 0 0 0 0  Follow up Falls prevention discussed Falls prevention discussed - - -    FALL RISK PREVENTION PERTAINING TO THE HOME:  Any stairs in or around the home? Yes  If so, are there any without handrails? No  Home free of loose throw rugs in walkways, pet beds, electrical cords, etc? Yes some rugs Adequate lighting in your home to reduce risk of falls? Yes   ASSISTIVE DEVICES UTILIZED TO PREVENT FALLS:  Life alert? No  Use of a cane, walker or w/c? No  Grab bars in the bathroom? Yes  Shower chair or bench in shower? Yes  Elevated toilet seat or a handicapped toilet? No   TIMED UP AND GO:  Was the test performed? Yes .  Length of time to ambulate 10 feet: 11 sec.   Gait steady and fast without use of assistive device  Cognitive Function:Normal cognitive status assessed by direct observation by this Nurse Health Advisor. No abnormalities found.   MMSE - Mini Mental State Exam 10/06/2016  Orientation to time 5  Orientation to Place 5  Registration 3  Attention/ Calculation 5  Recall 3  Language- name 2 objects 2  Language- repeat 1  Language- follow 3 step command 3  Language- read & follow direction 1  Write a sentence 1  Copy design 1  Total score 30     6CIT Screen 11/05/2020  What Year? 0 points  What month? 0 points  What time? 0 points  Count back from 20 0 points  Months in reverse 0 points  Repeat phrase 0  points  Total Score 0    Immunizations Immunization History  Administered Date(s) Administered   Fluad Quad(high Dose 65+) 06/30/2019   Influenza Split 07/26/2011   Influenza Whole 08/30/2007, 07/08/2009, 07/06/2010   Influenza, High Dose Seasonal PF 07/11/2014, 10/06/2016, 07/23/2017   Influenza,inj,Quad PF,6+ Mos 07/05/2013   Influenza-Unspecified 08/19/2015, 08/02/2018, 08/06/2020, 08/02/2021   PFIZER(Purple Top)SARS-COV-2 Vaccination 10/30/2019, 11/20/2019, 07/17/2020, 01/08/2021   Pfizer Covid-19 Vaccine Bivalent Booster 46yr & up 07/26/2021   Pneumococcal Conjugate-13 12/17/2013   Pneumococcal Polysaccharide-23 04/23/2003, 07/08/2009, 01/14/2020   Td 02/19/2003, 05/13/2021   Tdap 09/19/2013   Zoster Recombinat (Shingrix) 07/25/2017, 09/26/2017   Zoster, Live 02/21/2007    TDAP status: Up to date  Flu Vaccine status: Up to date  Pneumococcal vaccine status: Up to date  Covid-19 vaccine status: Completed vaccines  Qualifies for Shingles Vaccine? No   Zostavax completed Yes   Shingrix Completed?: Yes  Screening Tests Health Maintenance  Topic Date Due   HEMOGLOBIN A1C  11/13/2021   FOOT EXAM  05/13/2022   OPHTHALMOLOGY EXAM  09/10/2022   COLONOSCOPY (Pts [id number]  coverage will need to be confirmed)  07/08/2024   TETANUS/TDAP  05/14/2031   Pneumonia Vaccine 25+ Years old  Completed   INFLUENZA VACCINE  Completed   COVID-19 Vaccine  Completed   Hepatitis C Screening  Completed   Zoster Vaccines- Shingrix  Completed   HPV VACCINES  Aged Out    Health Maintenance  There are no preventive care reminders to display for this patient.  Colorectal cancer screening: No longer required.   Lung Cancer Screening: (Low Dose CT Chest recommended if Age 42-80 years, 30 pack-year currently smoking OR have quit w/in 15years.) does not qualify.     Additional Screening:  Hepatitis C Screening: Completed 09/24/2015  Vision Screening: Recommended annual  ophthalmology exams for early detection of glaucoma and other disorders of the eye. Is the patient up to date with their annual eye exam?  Yes  Who is the provider or what is the name of the office in which the patient attends annual eye exams? Dr. Ellie Lunch   Dental Screening: Recommended annual dental exams for proper oral hygiene  Community Resource Referral / Chronic Care Management: CRR required this visit?  No   CCM required this visit?  No      Plan:     I have personally reviewed and noted the following in the patients chart:   Medical and social history Use of alcohol, tobacco or illicit drugs  Current medications and supplements including opioid prescriptions. Patient is not currently taking opioid prescriptions. Functional ability and status Nutritional status Physical activity Advanced directives List of other physicians Hospitalizations, surgeries, and ER visits in previous 12 months Vitals Screenings to include cognitive, depression, and falls Referrals and appointments  In addition, I have reviewed and discussed with patient certain preventive protocols, quality metrics, and best practice recommendations. A written personalized care plan for preventive services as well as general preventive health recommendations were provided to patient.   Patient would like to access  avs on my-chart.   Marta Antu, LPN   12/23/4006  Nurse Health Advisor  Nurse Notes: None

## 2021-11-11 ENCOUNTER — Ambulatory Visit: Payer: Medicare Other

## 2021-11-11 DIAGNOSIS — H7312 Chronic myringitis, left ear: Secondary | ICD-10-CM | POA: Diagnosis not present

## 2021-11-11 DIAGNOSIS — Z4589 Encounter for adjustment and management of other implanted devices: Secondary | ICD-10-CM | POA: Diagnosis not present

## 2021-11-11 DIAGNOSIS — Z9889 Other specified postprocedural states: Secondary | ICD-10-CM | POA: Diagnosis not present

## 2021-11-11 DIAGNOSIS — H90A22 Sensorineural hearing loss, unilateral, left ear, with restricted hearing on the contralateral side: Secondary | ICD-10-CM | POA: Diagnosis not present

## 2021-11-11 DIAGNOSIS — H90A21 Sensorineural hearing loss, unilateral, right ear, with restricted hearing on the contralateral side: Secondary | ICD-10-CM | POA: Diagnosis not present

## 2021-11-11 DIAGNOSIS — Z9089 Acquired absence of other organs: Secondary | ICD-10-CM | POA: Diagnosis not present

## 2021-11-15 ENCOUNTER — Encounter: Payer: Self-pay | Admitting: Family Medicine

## 2021-11-17 ENCOUNTER — Other Ambulatory Visit: Payer: Self-pay

## 2021-11-17 ENCOUNTER — Emergency Department (HOSPITAL_BASED_OUTPATIENT_CLINIC_OR_DEPARTMENT_OTHER): Payer: Medicare Other

## 2021-11-17 ENCOUNTER — Inpatient Hospital Stay (HOSPITAL_BASED_OUTPATIENT_CLINIC_OR_DEPARTMENT_OTHER)
Admission: EM | Admit: 2021-11-17 | Discharge: 2021-11-19 | DRG: 305 | Disposition: A | Discharge status: Home / Self Care | Payer: Medicare Other | Attending: Internal Medicine | Admitting: Internal Medicine

## 2021-11-17 ENCOUNTER — Encounter (HOSPITAL_BASED_OUTPATIENT_CLINIC_OR_DEPARTMENT_OTHER): Payer: Self-pay

## 2021-11-17 DIAGNOSIS — Z791 Long term (current) use of non-steroidal anti-inflammatories (NSAID): Secondary | ICD-10-CM

## 2021-11-17 DIAGNOSIS — Z79899 Other long term (current) drug therapy: Secondary | ICD-10-CM | POA: Diagnosis not present

## 2021-11-17 DIAGNOSIS — G250 Essential tremor: Secondary | ICD-10-CM | POA: Diagnosis present

## 2021-11-17 DIAGNOSIS — Z87891 Personal history of nicotine dependence: Secondary | ICD-10-CM

## 2021-11-17 DIAGNOSIS — N1831 Chronic kidney disease, stage 3a: Secondary | ICD-10-CM | POA: Diagnosis present

## 2021-11-17 DIAGNOSIS — Z955 Presence of coronary angioplasty implant and graft: Secondary | ICD-10-CM

## 2021-11-17 DIAGNOSIS — R4781 Slurred speech: Secondary | ICD-10-CM | POA: Diagnosis not present

## 2021-11-17 DIAGNOSIS — Z885 Allergy status to narcotic agent status: Secondary | ICD-10-CM

## 2021-11-17 DIAGNOSIS — Z882 Allergy status to sulfonamides status: Secondary | ICD-10-CM

## 2021-11-17 DIAGNOSIS — Z20822 Contact with and (suspected) exposure to covid-19: Secondary | ICD-10-CM | POA: Diagnosis present

## 2021-11-17 DIAGNOSIS — E785 Hyperlipidemia, unspecified: Secondary | ICD-10-CM | POA: Diagnosis present

## 2021-11-17 DIAGNOSIS — M159 Polyosteoarthritis, unspecified: Secondary | ICD-10-CM | POA: Diagnosis present

## 2021-11-17 DIAGNOSIS — I161 Hypertensive emergency: Secondary | ICD-10-CM | POA: Diagnosis not present

## 2021-11-17 DIAGNOSIS — Z8719 Personal history of other diseases of the digestive system: Secondary | ICD-10-CM

## 2021-11-17 DIAGNOSIS — E039 Hypothyroidism, unspecified: Secondary | ICD-10-CM | POA: Diagnosis present

## 2021-11-17 DIAGNOSIS — Z7982 Long term (current) use of aspirin: Secondary | ICD-10-CM | POA: Diagnosis not present

## 2021-11-17 DIAGNOSIS — I2510 Atherosclerotic heart disease of native coronary artery without angina pectoris: Secondary | ICD-10-CM | POA: Diagnosis present

## 2021-11-17 DIAGNOSIS — Z7984 Long term (current) use of oral hypoglycemic drugs: Secondary | ICD-10-CM

## 2021-11-17 DIAGNOSIS — G459 Transient cerebral ischemic attack, unspecified: Secondary | ICD-10-CM | POA: Diagnosis not present

## 2021-11-17 DIAGNOSIS — Z820 Family history of epilepsy and other diseases of the nervous system: Secondary | ICD-10-CM | POA: Diagnosis not present

## 2021-11-17 DIAGNOSIS — N179 Acute kidney failure, unspecified: Secondary | ICD-10-CM | POA: Diagnosis not present

## 2021-11-17 DIAGNOSIS — Z825 Family history of asthma and other chronic lower respiratory diseases: Secondary | ICD-10-CM | POA: Diagnosis not present

## 2021-11-17 DIAGNOSIS — G43909 Migraine, unspecified, not intractable, without status migrainosus: Secondary | ICD-10-CM | POA: Diagnosis present

## 2021-11-17 DIAGNOSIS — N400 Enlarged prostate without lower urinary tract symptoms: Secondary | ICD-10-CM | POA: Diagnosis present

## 2021-11-17 DIAGNOSIS — Z8249 Family history of ischemic heart disease and other diseases of the circulatory system: Secondary | ICD-10-CM | POA: Diagnosis not present

## 2021-11-17 DIAGNOSIS — I129 Hypertensive chronic kidney disease with stage 1 through stage 4 chronic kidney disease, or unspecified chronic kidney disease: Secondary | ICD-10-CM | POA: Diagnosis present

## 2021-11-17 DIAGNOSIS — Z85820 Personal history of malignant melanoma of skin: Secondary | ICD-10-CM | POA: Diagnosis not present

## 2021-11-17 DIAGNOSIS — Z79890 Hormone replacement therapy (postmenopausal): Secondary | ICD-10-CM

## 2021-11-17 DIAGNOSIS — Z888 Allergy status to other drugs, medicaments and biological substances status: Secondary | ICD-10-CM

## 2021-11-17 DIAGNOSIS — Z8042 Family history of malignant neoplasm of prostate: Secondary | ICD-10-CM

## 2021-11-17 DIAGNOSIS — E1122 Type 2 diabetes mellitus with diabetic chronic kidney disease: Secondary | ICD-10-CM | POA: Diagnosis present

## 2021-11-17 DIAGNOSIS — I10 Essential (primary) hypertension: Secondary | ICD-10-CM | POA: Diagnosis present

## 2021-11-17 DIAGNOSIS — Z8673 Personal history of transient ischemic attack (TIA), and cerebral infarction without residual deficits: Secondary | ICD-10-CM | POA: Diagnosis not present

## 2021-11-17 LAB — TROPONIN I (HIGH SENSITIVITY)
Troponin I (High Sensitivity): 5 ng/L (ref <18)
Troponin I (High Sensitivity): 6 ng/L (ref <18)

## 2021-11-17 LAB — BASIC METABOLIC PANEL
Anion gap: 7 (ref 5–15)
BUN: 27 mg/dL — ABNORMAL HIGH (ref 8–23)
CO2: 28 mmol/L (ref 22–32)
Calcium: 9.5 mg/dL (ref 8.9–10.3)
Chloride: 105 mmol/L (ref 98–111)
Creatinine, Ser: 1.64 mg/dL — ABNORMAL HIGH (ref 0.61–1.24)
GFR, Estimated: 43 mL/min — ABNORMAL LOW (ref >60)
Glucose, Bld: 103 mg/dL — ABNORMAL HIGH (ref 70–99)
Potassium: 4.1 mmol/L (ref 3.5–5.1)
Sodium: 140 mmol/L (ref 135–145)

## 2021-11-17 LAB — CBC WITH DIFFERENTIAL/PLATELET
Abs Immature Granulocytes: 0.08 10*3/uL — ABNORMAL HIGH (ref 0.00–0.07)
Basophils Absolute: 0.1 10*3/uL (ref 0.0–0.1)
Basophils Relative: 1 %
Eosinophils Absolute: 0.4 10*3/uL (ref 0.0–0.5)
Eosinophils Relative: 7 %
HCT: 48.2 % (ref 39.0–52.0)
Hemoglobin: 16.6 g/dL (ref 13.0–17.0)
Immature Granulocytes: 1 %
Lymphocytes Relative: 20 %
Lymphs Abs: 1.3 10*3/uL (ref 0.7–4.0)
MCH: 32.2 pg (ref 26.0–34.0)
MCHC: 34.4 g/dL (ref 30.0–36.0)
MCV: 93.4 fL (ref 80.0–100.0)
Monocytes Absolute: 0.6 10*3/uL (ref 0.1–1.0)
Monocytes Relative: 9 %
Neutro Abs: 4 10*3/uL (ref 1.7–7.7)
Neutrophils Relative %: 62 %
Platelets: 154 10*3/uL (ref 150–400)
RBC: 5.16 MIL/uL (ref 4.22–5.81)
RDW: 13.3 % (ref 11.5–15.5)
WBC: 6.4 10*3/uL (ref 4.0–10.5)
nRBC: 0 % (ref 0.0–0.2)

## 2021-11-17 MED ORDER — LABETALOL HCL 5 MG/ML IV SOLN: 0.5000 mg/min | Status: DC

## 2021-11-17 MED ORDER — NICARDIPINE HCL IN NACL 20-0.86 MG/200ML-% IV SOLN
3.0000 mg/h | INTRAVENOUS | Status: DC
  Administered 2021-11-17 – 2021-11-18 (×2): 3 mg/h via INTRAVENOUS
  Filled 2021-11-17 (×3): qty 200

## 2021-11-17 NOTE — ED Provider Notes (Signed)
[facility]  Provider Note  CSN: [id number] Arrival date & time: 11/17/21 2021  History Chief Complaint  Patient presents with   Hypertension    [name] is a 76 y.o. male with history of HTN and CAD here with wife who supplements his history. She reports around 6pm she noticed his speech was slurred and he reported some blurry vision. He thought he was maybe getting a migraine because he has had similar visual changes before. He went to his bedroom to lie down and use his Cefaly (forehead stimulator device) which imprvoed his symptoms and he did not have any further slurred speech. He was still having some headache so he checked his BP and it was elevated to 195/97 which prompted him to come to the ED where he was noted to have markedly elevated BP. He is otherwise currently asymptomatic.    Home Medications Prior to Admission medications   Medication Sig Start Date End Date Taking? Authorizing Provider  aspirin 81 MG tablet Take 81 mg by mouth daily.    [provider]  atorvastatin (LIPITOR) 40 MG tablet TAKE ONE TABLET BY MOUTH DAILY AT 6PM Patient not taking: Reported on 11/08/2021 06/28/21   Lelon Perla, MD  azelastine (ASTELIN) 0.1 % nasal spray Place 1 spray into both nostrils daily as needed for rhinitis. Use in each nostril as directed    [provider]  Biotin 1000 MCG tablet Take 1,000 mcg by mouth 2 (two) times daily.    [provider]  Azucena Freed Serrata (BOSWELLIA PO) Take by mouth.    [provider]  celecoxib (CELEBREX) 200 MG capsule Take 1 capsule (200 mg total) by mouth daily. 11/05/20   [name], DO  cholecalciferol (VITAMIN D) 25 MCG (1000 UNIT) tablet Take 3,000 Units by mouth 3 (three) times daily.    [provider]  Cinnamon 500 MG capsule Take 1,000 mg by mouth 2 (two) times daily.    [provider]  Coenzyme Q10 (COQ-10) 100 MG capsule Take 1 capsule (100  mg total) by mouth daily. 02/04/19   Ladene Artist, MD  COVID-19 mRNA bivalent vaccine, Pfizer, (PFIZER COVID-19 VAC BIVALENT) injection Inject into the muscle. 07/26/21   Carlyle Basques, MD  Cyanocobalamin (VITAMIN B-12) 1000 MCG SUBL Take 1,000 mcg by mouth daily. 08/11/19   [provider]  Diclofenac Sodium 3 % GEL Qid prn Patient taking differently: Apply 1 application topically daily as needed (Arthritis). 04/24/20   [name], DO  ezetimibe (ZETIA) 10 MG tablet Take 1 tablet (10 mg total) by mouth daily. 12/07/20   Roma Schanz R, DO  ferrous sulfate 325 (65 FE) MG tablet Take 325 mg by mouth daily with breakfast.    [provider]  Ginger, Zingiber officinalis, (GINGER ROOT) 550 MG CAPS Take 550 mg by mouth 2 (two) times daily.    [provider]  levothyroxine (SYNTHROID) 112 MCG tablet Take 1 tablet (112 mcg total) by mouth daily before breakfast. 10/12/21   Carollee Herter, Alferd Apa, DO  losartan (COZAAR) 100 MG tablet Take 1 tablet (100 mg total) by mouth daily. 11/10/20   Roma Schanz R, DO  metFORMIN (GLUCOPHAGE) 500 MG tablet TAKE ONE TABLET BY MOUTH TWICE A DAY WITH A MEAL 05/31/21   Carollee Herter, Alferd Apa, DO  Multiple Vitamins-Minerals [facility] ADVANCED 50 PLUS PO) Take 1 tablet by mouth daily.    [provider]  Nerve Stimulator (CEFALY KIT) DEVI Apply 1 application topically daily at 12 noon. Electrical migraine kit for prevention on treatment of migraine headache    [provider]  OVER THE COUNTER MEDICATION Take 1 capsule by mouth 2 (two) times daily. Migra-eeze gel cap    [provider]  pantoprazole (PROTONIX) 40 MG tablet TAKE ONE TABLET BY MOUTH DAILY 06/28/21   Carollee Herter, Alferd Apa, DO  propranolol ER (INDERAL LA) 160 MG SR capsule Take 1 capsule (160 mg total) by mouth daily. One tablet po qd 07/13/21   Pieter Partridge, DO  tamsulosin (FLOMAX) 0.4 MG CAPS capsule Take 0.4 mg by mouth in the morning  and at bedtime. 03/24/16   Roma Schanz R, DO  topiramate (TOPAMAX) 50 MG tablet TAKE ONE TABLET BY MOUTH EVERY NIGHT AT BEDTIME 08/26/21   Tomi Likens, Adam R, DO  triamcinolone (KENALOG) 0.1 % Apply 1 application topically daily. 10/22/20   [provider]     Allergies    Sulfa antibiotics, Sulfamethoxazole-trimethoprim, Sulfasalazine, Codeine, Crestor [rosuvastatin calcium], Hydrocodone-acetaminophen, Other, and Rosuvastatin   Review of Systems   Review of Systems Please see HPI for pertinent positives and negatives  Physical Exam BP (!) 172/89    Pulse (!) 59    Temp 97.7 F (36.5 C) (Temporal)    Resp 14    Ht _0  (1.753 m)    Wt 70.3 kg    SpO2 99%    BMI 22.89 kg/m   Physical Exam Vitals and nursing note reviewed.  Constitutional:      Appearance: Normal appearance.  HENT:     Head: Normocephalic and atraumatic.     Nose: Nose normal.     Mouth/Throat:     Mouth: Mucous membranes are moist.  Eyes:     Extraocular Movements: Extraocular movements intact.     Conjunctiva/sclera: Conjunctivae normal.  Cardiovascular:     Rate and Rhythm: Normal rate.  Pulmonary:     Effort: Pulmonary effort is normal.     Breath sounds: Normal breath sounds.  Abdominal:     General: Abdomen is flat.     Palpations: Abdomen is soft.     Tenderness: There is no abdominal tenderness.  Musculoskeletal:        General: No swelling. Normal range of motion.     Cervical back: Neck supple.  Skin:    General: Skin is warm and dry.  Neurological:     General: No focal deficit present.     Mental Status: He is alert and oriented to person, place, and time.     Cranial Nerves: No cranial nerve deficit.     Sensory: No sensory deficit.     Motor: No weakness.     Coordination: Coordination normal.     Gait: Gait normal.  Psychiatric:        Mood and Affect: Mood normal.    ED Results / Procedures / Treatments   EKG EKG Interpretation  Date/Time:  Wednesday November 17 2021 20:47:49 EST Ventricular Rate:  59 PR Interval:  168 QRS Duration: 89 QT Interval:  433 QTC Calculation: 429 R Axis:   63 Text Interpretation: Sinus rhythm Probable left atrial enlargement Probable left ventricular hypertrophy No significant change since last tracing Confirmed by Calvert Cantor [phone]) on 11/17/2021 8:55:03 PM  Procedures .Critical Care Performed by: Truddie Hidden, MD Authorized by: Truddie Hidden, MD   Critical care provider statement:    Critical care time (minutes):  26   Critical care time was exclusive of:  Separately billable procedures and treating other patients   Critical care was necessary to treat or prevent imminent or life-threatening deterioration of the following conditions:  Renal failure and circulatory failure   Critical care was time spent personally by me on the following activities:  Development of treatment plan with patient or surrogate, discussions with consultants, evaluation of patient's response to treatment, examination of patient, ordering and review of laboratory studies, ordering and review of radiographic studies, ordering and performing treatments and interventions, pulse oximetry, re-evaluation of patient's condition and review of old charts   Care discussed with: admitting provider    Medications Ordered in the ED Medications  nicardipine (CARDENE) 77m in 0.86% saline 2067mIV infusion (0.1 mg/ml) (3 mg/hr Intravenous New Bag/Given 11/17/21 2219)    Initial Impression and Plan  Patient here with possible TIA vs complex migraine vs hypertensive emergency. Will check labs, CT and begin lowering BP.   ED Course   Clinical Course as of 11/17/21 2304  Wed Nov 17, 2021  2205 CBC is normal. BMP with mild AKI compared to baseline. CT head is negative. Given his signs of end organ damage and persistent hypertension (one reading of 159/101 likely inaccurate) will begin Cardene drip given his borderline HR. Will discuss admission  with ICU.  [CS]  2256 Spoke with Dr. [facility]iICU, who will accept for admission. Patient resting comfortably.  [CS]    Clinical Course User Index [CS] ShTruddie HiddenMD     MDM Rules/Calculators/A&P Medical Decision Making Problems Addressed: [facility]acute kidney [facility]) [facility] Hypertensive emergency: acute illness or injury that poses a threat to life or bodily functions Slurred speech: acute illness or injury that poses a threat to life or bodily functions  Amount and/or Complexity of Data Reviewed Labs: ordered. Decision-making details documented in ED Course. Radiology: ordered and independent interpretation performed. Decision-making details documented in ED Course. ECG/medicine tests: ordered and independent interpretation performed. Decision-making details documented in ED Course.  Risk Prescription drug management. Decision regarding hospitalization.  Critical Care Total time providing critical care: 30-74 minutes   Final Clinical Impression(s) / ED Diagnoses Final diagnoses:  Hypertensive emergency  AKI (acute kidney injury) (HCPine Knot Slurred speech    Rx / DC Orders ED Discharge Orders     None        ShTruddie HiddenMD 11/17/21 2304

## 2021-11-17 NOTE — ED Triage Notes (Signed)
Patient here POV from Home with HTN.  At approximately 1800 the Patient was hypertensive at 195/97. Patient was having Slurred Speech and Louise at that time that has since resolved.  No Symptoms currently besides residual Headache. NIH 0 in Triage.  NAD Noted during Triage. A&Ox4. GCS 15. Ambulatory.

## 2021-11-18 ENCOUNTER — Inpatient Hospital Stay (HOSPITAL_COMMUNITY): Payer: Medicare Other

## 2021-11-18 ENCOUNTER — Other Ambulatory Visit (HOSPITAL_COMMUNITY): Payer: Medicare Other

## 2021-11-18 DIAGNOSIS — Z8249 Family history of ischemic heart disease and other diseases of the circulatory system: Secondary | ICD-10-CM | POA: Diagnosis not present

## 2021-11-18 DIAGNOSIS — E1122 Type 2 diabetes mellitus with diabetic chronic kidney disease: Secondary | ICD-10-CM | POA: Diagnosis present

## 2021-11-18 DIAGNOSIS — I129 Hypertensive chronic kidney disease with stage 1 through stage 4 chronic kidney disease, or unspecified chronic kidney disease: Secondary | ICD-10-CM | POA: Diagnosis present

## 2021-11-18 DIAGNOSIS — R4781 Slurred speech: Secondary | ICD-10-CM | POA: Diagnosis present

## 2021-11-18 DIAGNOSIS — Z85820 Personal history of malignant melanoma of skin: Secondary | ICD-10-CM | POA: Diagnosis not present

## 2021-11-18 DIAGNOSIS — N1831 Chronic kidney disease, stage 3a: Secondary | ICD-10-CM | POA: Diagnosis present

## 2021-11-18 DIAGNOSIS — I161 Hypertensive emergency: Secondary | ICD-10-CM | POA: Diagnosis present

## 2021-11-18 DIAGNOSIS — E785 Hyperlipidemia, unspecified: Secondary | ICD-10-CM | POA: Diagnosis present

## 2021-11-18 DIAGNOSIS — I10 Essential (primary) hypertension: Secondary | ICD-10-CM | POA: Diagnosis present

## 2021-11-18 DIAGNOSIS — G43909 Migraine, unspecified, not intractable, without status migrainosus: Secondary | ICD-10-CM | POA: Diagnosis present

## 2021-11-18 DIAGNOSIS — Z20822 Contact with and (suspected) exposure to covid-19: Secondary | ICD-10-CM | POA: Diagnosis present

## 2021-11-18 DIAGNOSIS — E039 Hypothyroidism, unspecified: Secondary | ICD-10-CM | POA: Diagnosis present

## 2021-11-18 DIAGNOSIS — M159 Polyosteoarthritis, unspecified: Secondary | ICD-10-CM | POA: Diagnosis present

## 2021-11-18 DIAGNOSIS — Z79899 Other long term (current) drug therapy: Secondary | ICD-10-CM | POA: Diagnosis not present

## 2021-11-18 DIAGNOSIS — N179 Acute kidney failure, unspecified: Secondary | ICD-10-CM

## 2021-11-18 DIAGNOSIS — Z7984 Long term (current) use of oral hypoglycemic drugs: Secondary | ICD-10-CM | POA: Diagnosis not present

## 2021-11-18 DIAGNOSIS — Z820 Family history of epilepsy and other diseases of the nervous system: Secondary | ICD-10-CM | POA: Diagnosis not present

## 2021-11-18 DIAGNOSIS — G250 Essential tremor: Secondary | ICD-10-CM | POA: Diagnosis present

## 2021-11-18 DIAGNOSIS — Z7982 Long term (current) use of aspirin: Secondary | ICD-10-CM | POA: Diagnosis not present

## 2021-11-18 DIAGNOSIS — G459 Transient cerebral ischemic attack, unspecified: Secondary | ICD-10-CM | POA: Diagnosis not present

## 2021-11-18 DIAGNOSIS — Z8042 Family history of malignant neoplasm of prostate: Secondary | ICD-10-CM | POA: Diagnosis not present

## 2021-11-18 DIAGNOSIS — Z8673 Personal history of transient ischemic attack (TIA), and cerebral infarction without residual deficits: Secondary | ICD-10-CM | POA: Diagnosis not present

## 2021-11-18 DIAGNOSIS — N400 Enlarged prostate without lower urinary tract symptoms: Secondary | ICD-10-CM | POA: Diagnosis present

## 2021-11-18 DIAGNOSIS — Z79890 Hormone replacement therapy (postmenopausal): Secondary | ICD-10-CM | POA: Diagnosis not present

## 2021-11-18 DIAGNOSIS — Z825 Family history of asthma and other chronic lower respiratory diseases: Secondary | ICD-10-CM | POA: Diagnosis not present

## 2021-11-18 DIAGNOSIS — I2510 Atherosclerotic heart disease of native coronary artery without angina pectoris: Secondary | ICD-10-CM | POA: Diagnosis present

## 2021-11-18 LAB — BASIC METABOLIC PANEL
Anion gap: 9 (ref 5–15)
BUN: 22 mg/dL (ref 8–23)
CO2: 24 mmol/L (ref 22–32)
Calcium: 8.7 mg/dL — ABNORMAL LOW (ref 8.9–10.3)
Chloride: 105 mmol/L (ref 98–111)
Creatinine, Ser: 1.3 mg/dL — ABNORMAL HIGH (ref 0.61–1.24)
GFR, Estimated: 57 mL/min — ABNORMAL LOW (ref >60)
Glucose, Bld: 146 mg/dL — ABNORMAL HIGH (ref 70–99)
Potassium: 3.9 mmol/L (ref 3.5–5.1)
Sodium: 138 mmol/L (ref 135–145)

## 2021-11-18 LAB — RESP PANEL BY RT-PCR (FLU A&B, COVID) ARPGX2
Influenza A by PCR: NEGATIVE
Influenza B by PCR: NEGATIVE
SARS Coronavirus 2 by RT PCR: NEGATIVE

## 2021-11-18 LAB — HEMOGLOBIN A1C
Hgb A1c MFr Bld: 4.9 % (ref 4.8–5.6)
Mean Plasma Glucose: 93.93 mg/dL

## 2021-11-18 LAB — GLUCOSE, CAPILLARY
Glucose-Capillary: 116 mg/dL — ABNORMAL HIGH (ref 70–99)
Glucose-Capillary: 102 mg/dL — ABNORMAL HIGH (ref 70–99)

## 2021-11-18 LAB — MRSA NEXT GEN BY PCR, NASAL: MRSA by PCR Next Gen: NOT DETECTED

## 2021-11-18 LAB — MAGNESIUM: Magnesium: 1.8 mg/dL (ref 1.7–2.4)

## 2021-11-18 MED ORDER — ASPIRIN 81 MG PO CHEW
81.0000 mg | CHEWABLE_TABLET | Freq: Every day | ORAL | Status: DC
  Administered 2021-11-18 – 2021-11-19 (×2): 81 mg via ORAL
  Filled 2021-11-18 (×2): qty 1

## 2021-11-18 MED ORDER — LOSARTAN POTASSIUM 50 MG PO TABS
100.0000 mg | ORAL_TABLET | Freq: Every day | ORAL | Status: DC
  Administered 2021-11-18 – 2021-11-19 (×2): 100 mg via ORAL
  Filled 2021-11-18 (×2): qty 2

## 2021-11-18 MED ORDER — PANTOPRAZOLE SODIUM 40 MG PO TBEC
40.0000 mg | DELAYED_RELEASE_TABLET | Freq: Every day | ORAL | Status: DC
  Administered 2021-11-18 – 2021-11-19 (×2): 40 mg via ORAL
  Filled 2021-11-18 (×2): qty 1

## 2021-11-18 MED ORDER — INSULIN ASPART 100 UNIT/ML IJ SOLN: 0.0000 [IU] | Freq: Three times a day (TID) | INTRAMUSCULAR | Status: DC

## 2021-11-18 MED ORDER — ENOXAPARIN SODIUM 40 MG/0.4ML IJ SOSY
40.0000 mg | PREFILLED_SYRINGE | INTRAMUSCULAR | Status: DC
  Administered 2021-11-18 – 2021-11-19 (×2): 40 mg via SUBCUTANEOUS
  Filled 2021-11-18 (×2): qty 0.4

## 2021-11-18 MED ORDER — CHLORHEXIDINE GLUCONATE CLOTH 2 % EX PADS
6.0000 | MEDICATED_PAD | Freq: Every day | CUTANEOUS | Status: DC
  Administered 2021-11-18 – 2021-11-19 (×2): 6 via TOPICAL

## 2021-11-18 MED ORDER — TAMSULOSIN HCL 0.4 MG PO CAPS
0.4000 mg | ORAL_CAPSULE | Freq: Every day | ORAL | Status: DC
  Administered 2021-11-18 – 2021-11-19 (×2): 0.4 mg via ORAL
  Filled 2021-11-18 (×2): qty 1

## 2021-11-18 MED ORDER — DOCUSATE SODIUM 100 MG PO CAPS: 100.0000 mg | ORAL_CAPSULE | Freq: Two times a day (BID) | ORAL | Status: DC | PRN

## 2021-11-18 MED ORDER — POLYETHYLENE GLYCOL 3350 17 G PO PACK: 17.0000 g | PACK | Freq: Every day | ORAL | Status: DC | PRN

## 2021-11-18 MED ORDER — TOPIRAMATE 25 MG PO TABS
50.0000 mg | ORAL_TABLET | Freq: Every day | ORAL | Status: DC
  Administered 2021-11-18: 50 mg via ORAL
  Filled 2021-11-18 (×2): qty 2

## 2021-11-18 MED ORDER — LEVOTHYROXINE SODIUM 112 MCG PO TABS
112.0000 ug | ORAL_TABLET | Freq: Every day | ORAL | Status: DC
  Administered 2021-11-18 – 2021-11-19 (×2): 112 ug via ORAL
  Filled 2021-11-18 (×3): qty 1

## 2021-11-18 MED ORDER — EZETIMIBE 10 MG PO TABS
10.0000 mg | ORAL_TABLET | Freq: Every day | ORAL | Status: DC
  Administered 2021-11-18 – 2021-11-19 (×2): 10 mg via ORAL
  Filled 2021-11-18 (×2): qty 1

## 2021-11-18 MED ORDER — PROPRANOLOL HCL ER 80 MG PO CP24
160.0000 mg | ORAL_CAPSULE | Freq: Every day | ORAL | Status: DC
  Administered 2021-11-18 – 2021-11-19 (×2): 160 mg via ORAL
  Filled 2021-11-18 (×2): qty 2

## 2021-11-18 NOTE — Progress Notes (Signed)
Carotid artery duplex completed. Refer to "CV Proc" under chart review to view preliminary results.  11/18/2021 4:02 PM Kelby Aline., MHA, RVT, RDCS, RDMS

## 2021-11-18 NOTE — ED Notes (Signed)
Called [facility] to transport patient to [facility] 89M room 15

## 2021-11-18 NOTE — H&P (Addendum)
NAME:  [name], MRN:  [id number], DOB:  Aug 07, 1946, LOS: 0 ADMISSION DATE:  11/17/2021, CONSULTATION DATE:  11/18/21 REFERRING MD:  Karle Starch, CHIEF COMPLAINT:  Hypertension, slurred speech   History of Present Illness:  [name] is a 76 yo male with a history of HTN, CAD, T2DM, TIA, and BPH who presents to the ICU after he was found to be hypertensive with evidence of renal dysfunction in the ED. He states he began to have a migraine yesterday (2/8) at 6 PM. He also experienced blurred vision and a "loss for words," but not true slurred speech, that resolved after a few seconds. He did not experience any other neurological symptoms. After using his Cefaly, an electronic stimulation device to treat migraines, his migraine was still present. He checked his BP at home and states it was in 761P systolic. After speaking with a nurse line, he came to the ED where his BP was 172/89 and Cr was 1.64 with baseline around 1.10-1.20. He was placed on nicardipine drip with improvement of BP. EKG and Head CT were negative. PCCM was consulted for observation on nicardipine drip and end-organ dysfunction.  Pertinent  Medical History  HTN, CAD, T2DM, TIA, BPH  Significant Hospital Events: Including procedures, antibiotic start and stop dates in addition to other pertinent events   2/9 admitted to ICU for observation  Interim History / Subjective:  Patient states he feels fine other than a "hangover" after his migraine. He states he feels like this after his migraines. He denies any chest pain, vision changes, or headache at this time.  Objective   Blood pressure (!) 147/79, pulse 61, temperature 97.6 F (36.4 C), temperature source Oral, resp. rate 18, height _0  (1.753 m), weight 67.1 kg, SpO2 96 %.        Intake/Output Summary (Last 24 hours) at 11/18/2021 1350 Last data filed at 11/18/2021 1200 Gross per 24 hour  Intake 551.56 ml  Output 425 ml  Net 126.56 ml   Filed Weights   11/17/21 2037  11/18/21 0849  Weight: 70.3 kg 67.1 kg   Examination: General: Lying in bed, in NAD, conversant HENT: NCAT Lungs: CTAB Cardiovascular: RRR, no m/r/g Abdomen: Soft, nondistended, nontender Extremities: Warm and dry, moves all extremities grossly equally Neuro: A&O, no focal deficits GU: Not examined  Assessment & Plan:  Hypertensive emergency, resolved Patient's BP has remained stable around high 140s/80s since d/c nicardipine. He was restarted on his home dose of losartan 100 mg daily. Since his transient blurred vision and loss of words/slurred speech, he has not had any neurological symptoms. His CT head was negative for intracranial process. Suspect neurological symptoms secondary to markedly elevated BP. If BP continues to remain elevated, consider adding HCTZ to home regimen. He is stable and can be transferred out of ICU observation. -Continue monitoring BP -Consider HCTZ if persistently elevated BP at discharge -Contact patient placement for transfer out of ICU  Prerenal AKI vs progression of CKD Stage 3a Patient's Cr this morning 1.64>1.30 s/p medications in saline infusion. His baseline Cr at 6 months to one year ago was around 1.30, placing him in diagnosis of CKD stage 3a. He appears to be back at his baseline. Suspect elevation in Cr d/t swift BP correction vs. decreased oral intake before admission. -Continue to monitor BMP, clinical status -Encourage good PO intake  T2DM Patient's BG 116 this morning. His Hgb A1c is 4.9. His home medication of metformin 500 mg BID has not been restarted. -  Monitor CBG, will order ssi prn  CAD   HLD   Hypothyroidism   Migraines Patient's conditions are chronic and stable. He is on ASA 81 mg daily, zetia 10 mg daily, synthroid 112 mcg daily, and topiramate 50 mg daily. Suspect patient's headaches are at least partly d/t persistently elevated BP. Expect decrease in intensity and frequency with better BP control. -Continue home meds as  prescribed  Best Practice (right click and "Reselect all SmartList Selections" daily)   Diet/type: Heart healthy DVT prophylaxis: LMWH GI prophylaxis: PPI Lines: N/A Foley:  N/A Code Status:  full code  Labs   CBC: Recent Labs  Lab 11/17/21 2055  WBC 6.4  NEUTROABS 4.0  HGB 16.6  HCT 48.2  MCV 93.4  PLT 025    Basic Metabolic Panel: Recent Labs  Lab 11/17/21 2055 11/18/21 1024  NA 140 138  K 4.1 3.9  CL 105 105  CO2 28 24  GLUCOSE 103* 146*  BUN 27* 22  CREATININE 1.64* 1.30*  CALCIUM 9.5 8.7*  MG  --  1.8   GFR: Estimated Creatinine Clearance: 45.2 mL/min (A) (by C-G formula based on SCr of 1.3 mg/dL (H)). Recent Labs  Lab 11/17/21 2055  WBC 6.4    Liver Function Tests: No results for input(s): AST, ALT, ALKPHOS, BILITOT, PROT, ALBUMIN in the last 168 hours. No results for input(s): LIPASE, AMYLASE in the last 168 hours. No results for input(s): AMMONIA in the last 168 hours.  ABG    Component Value Date/Time   PHART 7.418 12/16/2008 0916   PCO2ART 36.0 12/16/2008 0916   PO2ART 96.0 12/16/2008 0916   HCO3 23.2 12/16/2008 0916   TCO2 24 12/16/2008 0916   ACIDBASEDEF 1.0 12/16/2008 0916   O2SAT 98.0 12/16/2008 0916     Coagulation Profile: No results for input(s): INR, PROTIME in the last 168 hours.  Cardiac Enzymes: No results for input(s): CKTOTAL, CKMB, CKMBINDEX, TROPONINI in the last 168 hours.  HbA1C: Hemoglobin A1C  Date/Time Value Ref Range Status  02/11/2016 12:00 AM 5.4  Final   Hgb A1c MFr Bld  Date/Time Value Ref Range Status  11/18/2021 10:24 AM 4.9 4.8 - 5.6 % Final    Comment:    (NOTE) Pre diabetes:          5.7%-6.4%  Diabetes:              >6.4%  Glycemic control for   <7.0% adults with diabetes   05/13/2021 01:51 PM 5.4 4.6 - 6.5 % Final    Comment:    Glycemic Control Guidelines for People with Diabetes:Non Diabetic:  <6%Goal of Therapy: <7%Additional Action Suggested:  >8%     CBG: Recent Labs  Lab  11/18/21 0908  GLUCAP 116*    Review of Systems:   Per HPI  Past Medical History:  He,  has a past medical history of Abdominal bruit (07/22/2010), Acute sinusitis (03/26/2015), Acute suppr otitis media w spon rupt ear drum, recur, r ear (04/06/2017), Angina pectoris (12/11/2008), Benign essential tremor (08/29/2017), Bilateral sensorineural hearing loss (11/30/2015), CAD S/P percutaneous coronary angioplasty (12/11/2008), Cataract, 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), Chronic pansinusitis (09/21/2016), Chronic right shoulder pain (10/30/2019), Chronic serous otitis media (04/19/2018), Chronic tubotympanic suppurative otitis media, bilateral, 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), Drug-induced neutropenia (11/10/2020), Dysphagia (10/18/2018), Essential hypertension (12/11/2008), Eustachian tube dysfunction, bilateral (11/30/2015), History of colonic polyps (07/12/2010), History of GI bleed (04/23/2020), History of left mastoidectomy (02/20/2020), History of placement of ear tubes, Hyperlipidemia associated with type 2 diabetes mellitus (07/10/2007), Hyperthyroidism (  07/12/2010), Hypothyroidism (07/10/2007), Internal hemorrhoids, Intractable episodic cluster headache (08/29/2017), Major depressive disorder (07/10/2007), Mastoiditis of both sides (09/21/2016), Migraine headache, Mild neurocognitive disorder due to multiple etiologies (12/25/2020), Non-insulin dependent type 2 diabetes mellitus (07/06/2010), Osteoarthritis of multiple joints (07/10/2007), Perennial allergic rhinitis (09/22/2016), Postprocedural urinary retention, Presbycusis of both ears (01/23/2018), Skin cancer, basal cell, Thrombocytopenia, TIA (transient ischemic attack) (2006), and Upper airway cough syndrome (09/25/2017).   Surgical History:   Past Surgical History:  Procedure Laterality Date   COLONOSCOPY  2011   coronary artery disease status post placement     of drug-eluting stent in the LAD in 2003,eEF 65% then   DEBRIDEMENT AND  CLOSURE WOUND N/A 12/08/2020   Procedure: CLOSURE LEFT POST AURICULAR SCALP WOUND;  Surgeon: Irene Limbo, MD;  Location: [facility];  Service: Plastics;  Laterality: N/A;   esophogeal dilation     fatty tissue removed     from neck 2003   HERNIA REPAIR  2007   mastoidectomy with tympanoplasty     MELANOMA EXCISION Left 11/17/2020   Procedure: WIDE LOCAL EXCISION, ADVANCED FLAP CLOSURE LEFT POSTERIOR EAR MELANOMA;  Surgeon: Stark Klein, MD;  Location: [facility];  Service: General;  Laterality: Left;   MELANOMA EXCISION Left 12/01/2020   Procedure: RE-EXCISION LEFT POSTERIOR AURICULAR MELANOMA WITH TEMPORARY CLOSURE;  Surgeon: Stark Klein, MD;  Location: [facility];  Service: General;  Laterality: Left;  60 MIN TOTAL   POLYPECTOMY  2011   +TA   right toe bone spur surgery     SKIN FULL THICKNESS GRAFT Left 12/08/2020   Procedure: Full thickness skin graft from left upper arm;  Surgeon: Irene Limbo, MD;  Location: [facility];  Service: Plastics;  Laterality: Left;   THYROIDECTOMY, PARTIAL  07/2002   right thyroid   TONSILLECTOMY     UPPER GI ENDOSCOPY       Social History:   reports that he quit smoking about 56 years ago. His smoking use included cigarettes. He has never used smokeless tobacco. He reports that he does not drink alcohol and does not use drugs.   Family History:  His family history includes Alzheimer's disease in his mother; COPD in his brother; Heart disease in his father and mother; Hypertension in his brother, brother, and brother; Prostate cancer in his brother. There is no history of Colon cancer, Colon polyps, Rectal cancer, Stomach cancer, or Esophageal cancer.   Allergies Allergies  Allergen Reactions   Sulfa Antibiotics Other (See Comments)    Severe crippling  joint and muscle pain and redness   Sulfamethoxazole-Trimethoprim Other (See Comments)    Joint and muscle pain   Sulfasalazine Other (See Comments)    Codeine Other (See Comments)    Unknown   Crestor [Rosuvastatin Calcium] Other (See Comments)    Severe joint and muscle pain   Hydrocodone-Acetaminophen Other (See Comments)    To strong vicodin    Other     Apples and tomatoes...... Nausea and migraines.     Rosuvastatin     REACTION: REALLY BAD JOINT/MUSCLE PAIN     Home Medications  Prior to Admission medications   Medication Sig Start Date End Date Taking? Authorizing Provider  aspirin 81 MG tablet Take 81 mg by mouth daily.    [provider]  atorvastatin (LIPITOR) 40 MG tablet TAKE ONE TABLET BY MOUTH DAILY AT 6PM Patient not taking: Reported on 11/08/2021 06/28/21   Lelon Perla, MD  azelastine (ASTELIN) 0.1 % nasal  spray Place 1 spray into both nostrils daily as needed for rhinitis. Use in each nostril as directed    [provider]  Biotin 1000 MCG tablet Take 1,000 mcg by mouth 2 (two) times daily.    [provider]  Azucena Freed Serrata (BOSWELLIA PO) Take by mouth.    [provider]  celecoxib (CELEBREX) 200 MG capsule Take 1 capsule (200 mg total) by mouth daily. 11/05/20   [name], DO  cholecalciferol (VITAMIN D) 25 MCG (1000 UNIT) tablet Take 3,000 Units by mouth 3 (three) times daily.    [provider]  Cinnamon 500 MG capsule Take 1,000 mg by mouth 2 (two) times daily.    [provider]  Coenzyme Q10 (COQ-10) 100 MG capsule Take 1 capsule (100 mg total) by mouth daily. 02/04/19   Ladene Artist, MD  COVID-19 mRNA bivalent vaccine, Pfizer, (PFIZER COVID-19 VAC BIVALENT) injection Inject into the muscle. 07/26/21   Carlyle Basques, MD  Cyanocobalamin (VITAMIN B-12) 1000 MCG SUBL Take 1,000 mcg by mouth daily. 08/11/19   [provider]  Diclofenac Sodium 3 % GEL Qid prn Patient taking differently: Apply 1 application topically daily as needed (Arthritis). 04/24/20   [name], DO  ezetimibe (ZETIA) 10 MG tablet Take 1 tablet (10  mg total) by mouth daily. 12/07/20   Roma Schanz R, DO  ferrous sulfate 325 (65 FE) MG tablet Take 325 mg by mouth daily with breakfast.    [provider]  Ginger, Zingiber officinalis, (GINGER ROOT) 550 MG CAPS Take 550 mg by mouth 2 (two) times daily.    [provider]  levothyroxine (SYNTHROID) 112 MCG tablet Take 1 tablet (112 mcg total) by mouth daily before breakfast. 10/12/21   Carollee Herter, Alferd Apa, DO  losartan (COZAAR) 100 MG tablet Take 1 tablet (100 mg total) by mouth daily. 11/10/20   Roma Schanz R, DO  metFORMIN (GLUCOPHAGE) 500 MG tablet TAKE ONE TABLET BY MOUTH TWICE A DAY WITH A MEAL 05/31/21   Carollee Herter, Alferd Apa, DO  Multiple Vitamins-Minerals [facility] ADVANCED 50 PLUS PO) Take 1 tablet by mouth daily.    [provider]  Nerve Stimulator (CEFALY KIT) DEVI Apply 1 application topically daily at 12 noon. Electrical migraine kit for prevention on treatment of migraine headache    [provider]  OVER THE COUNTER MEDICATION Take 1 capsule by mouth 2 (two) times daily. Migra-eeze gel cap    [provider]  pantoprazole (PROTONIX) 40 MG tablet TAKE ONE TABLET BY MOUTH DAILY 06/28/21   Carollee Herter, Alferd Apa, DO  propranolol ER (INDERAL LA) 160 MG SR capsule Take 1 capsule (160 mg total) by mouth daily. One tablet po qd 07/13/21   Pieter Partridge, DO  tamsulosin (FLOMAX) 0.4 MG CAPS capsule Take 0.4 mg by mouth in the morning and at bedtime. 03/24/16   Roma Schanz R, DO  topiramate (TOPAMAX) 50 MG tablet TAKE ONE TABLET BY MOUTH EVERY NIGHT AT BEDTIME 08/26/21   Tomi Likens, Adam R, DO  triamcinolone (KENALOG) 0.1 % Apply 1 application topically daily. 10/22/20   [provider]     Critical care time: 30 minutes    Patrina Levering, Tat [place]

## 2021-11-18 NOTE — Progress Notes (Signed)
Patient is stable for transfer out of ICU. Transfer order placed. Dr. Nevada Crane with [facility] to take over tomorrow morning (2/10) at 7 AM as primary.

## 2021-11-19 ENCOUNTER — Telehealth: Payer: Self-pay | Admitting: Cardiology

## 2021-11-19 ENCOUNTER — Other Ambulatory Visit (HOSPITAL_COMMUNITY): Payer: Medicare Other

## 2021-11-19 LAB — BASIC METABOLIC PANEL
Anion gap: 9 (ref 5–15)
BUN: 27 mg/dL — ABNORMAL HIGH (ref 8–23)
CO2: 24 mmol/L (ref 22–32)
Calcium: 8.8 mg/dL — ABNORMAL LOW (ref 8.9–10.3)
Chloride: 105 mmol/L (ref 98–111)
Creatinine, Ser: 1.52 mg/dL — ABNORMAL HIGH (ref 0.61–1.24)
GFR, Estimated: 47 mL/min — ABNORMAL LOW (ref >60)
Glucose, Bld: 104 mg/dL — ABNORMAL HIGH (ref 70–99)
Potassium: 3.7 mmol/L (ref 3.5–5.1)
Sodium: 138 mmol/L (ref 135–145)

## 2021-11-19 LAB — CBC WITH DIFFERENTIAL/PLATELET
Abs Immature Granulocytes: 0.05 10*3/uL (ref 0.00–0.07)
Basophils Absolute: 0.1 10*3/uL (ref 0.0–0.1)
Basophils Relative: 1 %
Eosinophils Absolute: 0.3 10*3/uL (ref 0.0–0.5)
Eosinophils Relative: 5 %
HCT: 44.5 % (ref 39.0–52.0)
Hemoglobin: 15.8 g/dL (ref 13.0–17.0)
Immature Granulocytes: 1 %
Lymphocytes Relative: 19 %
Lymphs Abs: 1.3 10*3/uL (ref 0.7–4.0)
MCH: 32.9 pg (ref 26.0–34.0)
MCHC: 35.5 g/dL (ref 30.0–36.0)
MCV: 92.7 fL (ref 80.0–100.0)
Monocytes Absolute: 0.7 10*3/uL (ref 0.1–1.0)
Monocytes Relative: 11 %
Neutro Abs: 4.2 10*3/uL (ref 1.7–7.7)
Neutrophils Relative %: 63 %
Platelets: 128 10*3/uL — ABNORMAL LOW (ref 150–400)
RBC: 4.8 MIL/uL (ref 4.22–5.81)
RDW: 13.1 % (ref 11.5–15.5)
WBC: 6.7 10*3/uL (ref 4.0–10.5)
nRBC: 0 % (ref 0.0–0.2)

## 2021-11-19 LAB — MAGNESIUM: Magnesium: 2 mg/dL (ref 1.7–2.4)

## 2021-11-19 MED ORDER — HYDRALAZINE HCL 50 MG PO TABS: 50.0000 mg | ORAL_TABLET | Freq: Two times a day (BID) | ORAL | 3 refills | Status: DC

## 2021-11-19 MED ORDER — AMLODIPINE BESYLATE 5 MG PO TABS
5.0000 mg | ORAL_TABLET | Freq: Every day | ORAL | Status: DC
  Administered 2021-11-19: 5 mg via ORAL
  Filled 2021-11-19: qty 1

## 2021-11-19 MED ORDER — AMLODIPINE BESYLATE 5 MG PO TABS: 5.0000 mg | ORAL_TABLET | Freq: Every day | ORAL | 0 refills | Status: DC

## 2021-11-19 NOTE — Discharge Summary (Signed)
Physician Discharge Summary  RUDDY SWIRE PXT:[id number] DOB: 09-15-1946 DOA: 11/17/2021  PCP: [name], DO  Admit date: 11/17/2021 Discharge date: 11/19/2021  Admitted From: Home Disposition:  Home  Recommendations for Outpatient Follow-up:  Follow up with PCP in 1-2 weeks Please obtain BMP/CBC in one week Please follow up with cardiology as scheduled  Home Health:None  Equipment/Devices:None  Discharge Condition: Stable CODE STATUS: Full Diet recommendation: Low-salt low-fat low-carb diet  Brief/Interim Summary: Kashaun Bebo is a 76 yo male with a history of HTN, CAD, T2DM, TIA, and BPH who presents to the ICU after he was found to be hypertensive with evidence of renal dysfunction in the ED. Nicardipine gtt initiated and transitioned to PO now; with the addition of amlodipine has well-controlled blood pressure.  Otherwise stable for discharge.  Discharge Diagnoses:  Principal Problem:   Hypertensive emergency Active Problems:   Severe hypertension No new Assessment & Plan notes have been filed under this hospital service since the last note was generated. Service: Hospitalist  Discharge Instructions   Allergies as of 11/19/2021       Reactions   Sulfa Antibiotics Other (See Comments)   Severe crippling  joint and muscle pain and redness   Sulfamethoxazole-trimethoprim Other (See Comments)   Joint and muscle pain   Sulfasalazine Other (See Comments)   Joint and muscle pain   Codeine Other (See Comments)   Unknown   Crestor [rosuvastatin Calcium] Other (See Comments)   Severe joint and muscle pain   Hydrocodone-acetaminophen Other (See Comments)   To strong vicodin   Other    Apples and tomatoes...... Nausea and migraines.    Rosuvastatin Other (See Comments)   REACTION: REALLY BAD JOINT/MUSCLE PAIN        Medication List     TAKE these medications    amLODipine 5 MG tablet Commonly known as: NORVASC Take 1 tablet (5 mg total) by mouth daily.    aspirin 81 MG tablet Take 81 mg by mouth daily.   atorvastatin 40 MG tablet Commonly known as: LIPITOR TAKE ONE TABLET BY MOUTH DAILY AT 6PM   azelastine 0.1 % nasal spray Commonly known as: ASTELIN Place 1 spray into both nostrils daily as needed for rhinitis. Use in each nostril as directed   Biotin 1000 MCG tablet Take 1,000 mcg by mouth 2 (two) times daily.   BOSWELLIA PO Take 500 mg by mouth in the morning and at bedtime.   Cefaly Kit [employer] Apply 1 application topically daily at 12 noon. Electrical migraine kit for prevention on treatment of migraine headache   celecoxib 200 MG capsule Commonly known as: CELEBREX Take 1 capsule (200 mg total) by mouth daily.   cholecalciferol 25 MCG (1000 UNIT) tablet Commonly known as: VITAMIN D Take 3,000 Units by mouth 3 (three) times daily.   Cinnamon 500 MG capsule Take 1,000 mg by mouth 2 (two) times daily.   CoQ-10 100 MG capsule Take 1 capsule (100 mg total) by mouth daily.   Diclofenac Sodium 3 % Gel Qid prn What changed:  how much to take how to take this when to take this reasons to take this additional instructions   ezetimibe 10 MG tablet Commonly known as: ZETIA Take 1 tablet (10 mg total) by mouth daily.   ferrous sulfate 325 (65 FE) MG tablet Take 325 mg by mouth daily with breakfast.   Ginger Root 550 MG Caps Take 550 mg by mouth 2 (two) times daily.   levothyroxine 112  MCG tablet Commonly known as: Synthroid Take 1 tablet (112 mcg total) by mouth daily before breakfast.   losartan 100 MG tablet Commonly known as: COZAAR Take 1 tablet (100 mg total) by mouth daily.   metFORMIN 500 MG tablet Commonly known as: GLUCOPHAGE TAKE ONE TABLET BY MOUTH TWICE A DAY WITH A MEAL   OVER THE COUNTER MEDICATION Take 1 capsule by mouth 2 (two) times daily. Migra-eeze gel cap   pantoprazole 40 MG tablet Commonly known as: PROTONIX TAKE ONE TABLET BY MOUTH DAILY   Pfizer COVID-19 Vac Bivalent  injection Generic drug: COVID-19 mRNA bivalent vaccine (Pfizer) Inject into the muscle.   propranolol ER 160 MG SR capsule Commonly known as: INDERAL LA Take 1 capsule (160 mg total) by mouth daily. One tablet po qd   tamsulosin 0.4 MG Caps capsule Commonly known as: FLOMAX Take 0.4 mg by mouth in the morning and at bedtime.   THERAGRAN-M ADVANCED 50 PLUS PO Take 1 tablet by mouth daily.   topiramate 50 MG tablet Commonly known as: TOPAMAX TAKE ONE TABLET BY MOUTH EVERY NIGHT AT BEDTIME   triamcinolone cream 0.1 % Commonly known as: KENALOG Apply 1 application topically daily.   Vitamin B-12 1000 MCG Subl Take 1,000 mcg by mouth daily.        Allergies  Allergen Reactions   Sulfa Antibiotics Other (See Comments)    Severe crippling  joint and muscle pain and redness   Sulfamethoxazole-Trimethoprim Other (See Comments)    Joint and muscle pain   Sulfasalazine Other (See Comments)    Joint and muscle pain   Codeine Other (See Comments)    Unknown   Crestor [Rosuvastatin Calcium] Other (See Comments)    Severe joint and muscle pain   Hydrocodone-Acetaminophen Other (See Comments)    To strong vicodin    Other     Apples and tomatoes...... Nausea and migraines.     Rosuvastatin Other (See Comments)    REACTION: REALLY BAD JOINT/MUSCLE PAIN    Consultations: PCCM  Procedures/Studies: CT Head Wo Contrast  Result Date: 11/17/2021 CLINICAL DATA:  Neuro deficit, acute, stroke suspected. Hypertension. Slurred speech, blurred vision EXAM: CT HEAD WITHOUT CONTRAST TECHNIQUE: Contiguous axial images were obtained from the base of the skull through the vertex without intravenous contrast. RADIATION DOSE REDUCTION: This exam was performed according to the departmental dose-optimization program which includes automated exposure control, adjustment of the mA and/or kV according to patient size and/or use of iterative reconstruction technique. COMPARISON:  01/04/2019 FINDINGS:  Brain: No acute intracranial abnormality. Specifically, no hemorrhage, hydrocephalus, mass lesion, acute infarction, or significant intracranial injury. Vascular: No hyperdense vessel or unexpected calcification. Skull: No acute calvarial abnormality. Sinuses/Orbits: No acute findings Other: None IMPRESSION: No acute intracranial abnormality. Electronically Signed   By: Rolm Baptise M.D.   On: 11/17/2021 21:37   VAS US CAROTID  Result Date: 11/18/2021 Carotid Arterial Duplex Study Patient Name:  [name]  Date of Exam:   11/18/2021 Medical Rec #: [id number]      Accession #:    [id number] Date of Birth: 11/24/1945      Patient Gender: M Patient Age:   76 years Exam Location:  [facility] Procedure:      VAS US CAROTID Referring Phys: Currie Paris CHAND --------------------------------------------------------------------------------  Indications:       TIA. Risk Factors:      Hypertension, Diabetes. Comparison Study:  No prior study Performing Technologist: Maudry Mayhew MHA, RDMS, RVT, RDCS  Examination Guidelines:  A complete evaluation includes B-mode imaging, spectral Doppler, color Doppler, and power Doppler as needed of all accessible portions of each vessel. Bilateral testing is considered an integral part of a complete examination. Limited examinations for reoccurring indications may be performed as noted.  Right Carotid Findings: +---------+--------+-------+--------+---------------------------------+--------+            PSV cm/s EDV     Stenosis Plaque Description                Comments                      cm/s                                                         +---------+--------+-------+--------+---------------------------------+--------+  CCA Prox  94       12                                                           +---------+--------+-------+--------+---------------------------------+--------+  CCA       87       13               smooth and heterogenous                      Distal                                                                           +---------+--------+-------+--------+---------------------------------+--------+  ICA Prox  74       14               heterogenous, irregular and                                                      calcific                                    +---------+--------+-------+--------+---------------------------------+--------+  ICA       51       13                                                            Distal                                                                          +---------+--------+-------+--------+---------------------------------+--------+  ECA       43                                                                    +---------+--------+-------+--------+---------------------------------+--------+ +----------+--------+-------+----------------+-------------------+             PSV cm/s EDV cms Describe         Arm Pressure (mmHG)  +----------+--------+-------+----------------+-------------------+  Subclavian 137              Multiphasic, WNL                      +----------+--------+-------+----------------+-------------------+ +---------+--------+--+--------+-+---------+  Vertebral PSV cm/s 35 EDV cm/s 7 Antegrade  +---------+--------+--+--------+-+---------+  Left Carotid Findings: +----------+--------+--------+--------+---------------------+------------------+             PSV cm/s EDV cm/s Stenosis Plaque Description    Comments            +----------+--------+--------+--------+---------------------+------------------+  CCA Prox   115      12                                                          +----------+--------+--------+--------+---------------------+------------------+  CCA Distal 94       14                                      intimal thickening  +----------+--------+--------+--------+---------------------+------------------+  ICA Prox   50       12                smooth and                                                                        homogeneous                               +----------+--------+--------+--------+---------------------+------------------+  ICA Distal 45       12                                                          +----------+--------+--------+--------+---------------------+------------------+  ECA        94                                                                   +----------+--------+--------+--------+---------------------+------------------+ +----------+--------+--------+----------------+-------------------+  PSV cm/s EDV cm/s Describe         Arm Pressure (mmHG)  +----------+--------+--------+----------------+-------------------+  Subclavian 125               Multiphasic, WNL                      +----------+--------+--------+----------------+-------------------+ +---------+--------+--+--------+-+---------+  Vertebral PSV cm/s 32 EDV cm/s 7 Antegrade  +---------+--------+--+--------+-+---------+   Summary: Right Carotid: Velocities in the right ICA are consistent with a 1-39% stenosis. Left Carotid: Velocities in the left ICA are consistent with a 1-39% stenosis. Vertebrals:  Bilateral vertebral arteries demonstrate antegrade flow. Subclavians: Normal flow hemodynamics were seen in bilateral subclavian              arteries. *See table(s) above for measurements and observations.  Electronically signed by Monica Martinez MD on 11/18/2021 at 6:45:28 PM.    Final      Subjective: No acute issues or events overnight   Discharge Exam: Vitals:   11/19/21 0532 11/19/21 0919  BP: (!) 162/84 (!) 165/76  Pulse: (!) 57 (!) 59  Resp: 16 18  Temp: 97.6 F (36.4 C) 97.7 F (36.5 C)  SpO2: 97% 97%   Vitals:   11/18/21 2150 11/19/21 0221 11/19/21 0532 11/19/21 0919  BP: (!) 161/83 (!) 152/79 (!) 162/84 (!) 165/76  Pulse: (!) 57 (!) 57 (!) 57 (!) 59  Resp: '18 17 16 18  ' Temp: 97.8 F (36.6 C) 97.8 F (36.6 C) 97.6 F (36.4 C) 97.7 F (36.5 C)  TempSrc: Oral      SpO2: 98% 96% 97% 97%  Weight:      Height:        General: Pt is alert, awake, not in acute distress Cardiovascular: RRR, S1/S2 +, no rubs, no gallops Respiratory: CTA bilaterally, no wheezing, no rhonchi Abdominal: Soft, NT, ND, bowel sounds + Extremities: no edema, no cyanosis    The results of significant diagnostics from this hospitalization (including imaging, microbiology, ancillary and laboratory) are listed below for reference.     Microbiology: Recent Results (from the past 240 hour(s))  Resp Panel by RT-PCR (Flu A&B, Covid) Nasopharyngeal Swab     Status: None   Collection Time: 11/17/21 11:10 PM   Specimen: Nasopharyngeal Swab; Nasopharyngeal(NP) swabs in vial transport medium  Result Value Ref Range Status   SARS Coronavirus 2 by RT PCR NEGATIVE NEGATIVE Final    Comment: (NOTE) SARS-CoV-2 target nucleic acids are NOT DETECTED.  The SARS-CoV-2 RNA is generally detectable in upper respiratory specimens during the acute phase of infection. The lowest concentration of SARS-CoV-2 viral copies this assay can detect is 138 copies/mL. A negative result does not preclude SARS-Cov-2 infection and should not be used as the sole basis for treatment or other patient management decisions. A negative result may occur with  improper specimen collection/handling, submission of specimen other than nasopharyngeal swab, presence of viral mutation(s) within the areas targeted by this assay, and inadequate number of viral copies(<138 copies/mL). A negative result must be combined with clinical observations, patient history, and epidemiological information. The expected result is Negative.  Fact Sheet for Patients:  EntrepreneurPulse.com.au  Fact Sheet for Healthcare Providers:  IncredibleEmployment.be  This test is no t yet approved or cleared by the [place] FDA and  has been authorized for detection and/or diagnosis of SARS-CoV-2  by FDA under an Emergency Use Authorization (EUA). This EUA will remain  in effect (meaning this test can be used) for the duration  of the COVID-19 declaration under Section 564(b)(1) of the Act, 21 U.S.C.section 360bbb-3(b)(1), unless the authorization is terminated  or revoked sooner.       Influenza A by PCR NEGATIVE NEGATIVE Final   Influenza B by PCR NEGATIVE NEGATIVE Final    Comment: (NOTE) The Xpert Xpress SARS-CoV-2/FLU/RSV plus assay is intended as an aid in the diagnosis of influenza from Nasopharyngeal swab specimens and should not be used as a sole basis for treatment. Nasal washings and aspirates are unacceptable for Xpert Xpress SARS-CoV-2/FLU/RSV testing.  Fact Sheet for Patients: EntrepreneurPulse.com.au  Fact Sheet for Healthcare Providers: IncredibleEmployment.be  This test is not yet approved or cleared by the [place] FDA and has been authorized for detection and/or diagnosis of SARS-CoV-2 by FDA under an Emergency Use Authorization (EUA). This EUA will remain in effect (meaning this test can be used) for the duration of the COVID-19 declaration under Section 564(b)(1) of the Act, 21 U.S.C. section 360bbb-3(b)(1), unless the authorization is terminated or revoked.  Performed at [facility], [address], [place], [place] [address]   MRSA Next Gen by PCR, Nasal     Status: None   Collection Time: 11/18/21  8:58 AM   Specimen: Nasal Mucosa; Nasal Swab  Result Value Ref Range Status   MRSA by PCR Next Gen NOT DETECTED NOT DETECTED Final    Comment: (NOTE) The GeneXpert MRSA Assay (FDA approved for NASAL specimens only), is one component of a comprehensive MRSA colonization surveillance program. It is not intended to diagnose MRSA infection nor to guide or monitor treatment for MRSA infections. Test performance is not FDA approved in patients less than 52 years old. Performed at [facility], [place] [address] [place], [place] [address]      Labs: BNP (last 3 results) No results for input(s): BNP in the last 8760 hours. Basic Metabolic Panel: Recent Labs  Lab 11/17/21 2055 11/18/21 1024 11/19/21 0342  NA 140 138 138  K 4.1 3.9 3.7  CL 105 105 105  CO2 '28 24 24  ' GLUCOSE 103* 146* 104*  BUN 27* 22 27*  CREATININE 1.64* 1.30* 1.52*  CALCIUM 9.5 8.7* 8.8*  MG  --  1.8 2.0   Liver Function Tests: No results for input(s): AST, ALT, ALKPHOS, BILITOT, PROT, ALBUMIN in the last 168 hours. No results for input(s): LIPASE, AMYLASE in the last 168 hours. No results for input(s): AMMONIA in the last 168 hours. CBC: Recent Labs  Lab 11/17/21 2055 11/19/21 0342  WBC 6.4 6.7  NEUTROABS 4.0 4.2  HGB 16.6 15.8  HCT 48.2 44.5  MCV 93.4 92.7  PLT 154 128*   Cardiac Enzymes: No results for input(s): CKTOTAL, CKMB, CKMBINDEX, TROPONINI in the last 168 hours. BNP: Invalid input(s): POCBNP CBG: Recent Labs  Lab 11/18/21 0908 11/18/21 2154  GLUCAP 116* 102*   D-Dimer No results for input(s): DDIMER in the last 72 hours. Hgb A1c Recent Labs    11/18/21 1024  HGBA1C 4.9   Lipid Profile No results for input(s): CHOL, HDL, LDLCALC, TRIG, CHOLHDL, LDLDIRECT in the last 72 hours. Thyroid function studies No results for input(s): TSH, T4TOTAL, T3FREE, THYROIDAB in the last 72 hours.  Invalid input(s): FREET3 Anemia work up No results for input(s): VITAMINB12, FOLATE, FERRITIN, TIBC, IRON, RETICCTPCT in the last 72 hours. Urinalysis    Component Value Date/Time   COLORURINE AMBER (A) 05/17/2017 2339   APPEARANCEUR CLEAR 05/17/2017 2339   LABSPEC 1.025 08/20/2017 1328   PHURINE  6.0 08/20/2017 1328   GLUCOSEU 100 (A) 08/20/2017 1328   HGBUR NEGATIVE 08/20/2017 [place] 08/20/2017 1328   BILIRUBINUR neg 05/22/2017 1649   KETONESUR TRACE (A) 08/20/2017 1328   PROTEINUR 30 (A) 08/20/2017 1328   UROBILINOGEN 0.2 08/20/2017 1328   NITRITE  POSITIVE (A) 08/20/2017 1328   LEUKOCYTESUR SMALL (A) 08/20/2017 1328   Sepsis Labs Invalid input(s): PROCALCITONIN,  WBC,  LACTICIDVEN Microbiology Recent Results (from the past 240 hour(s))  Resp Panel by RT-PCR (Flu A&B, Covid) Nasopharyngeal Swab     Status: None   Collection Time: 11/17/21 11:10 PM   Specimen: Nasopharyngeal Swab; Nasopharyngeal(NP) swabs in vial transport medium  Result Value Ref Range Status   SARS Coronavirus 2 by RT PCR NEGATIVE NEGATIVE Final    Comment: (NOTE) SARS-CoV-2 target nucleic acids are NOT DETECTED.  The SARS-CoV-2 RNA is generally detectable in upper respiratory specimens during the acute phase of infection. The lowest concentration of SARS-CoV-2 viral copies this assay can detect is 138 copies/mL. A negative result does not preclude SARS-Cov-2 infection and should not be used as the sole basis for treatment or other patient management decisions. A negative result may occur with  improper specimen collection/handling, submission of specimen other than nasopharyngeal swab, presence of viral mutation(s) within the areas targeted by this assay, and inadequate number of viral copies(<138 copies/mL). A negative result must be combined with clinical observations, patient history, and epidemiological information. The expected result is Negative.  Fact Sheet for Patients:  EntrepreneurPulse.com.au  Fact Sheet for Healthcare Providers:  IncredibleEmployment.be  This test is no t yet approved or cleared by the [place] FDA and  has been authorized for detection and/or diagnosis of SARS-CoV-2 by FDA under an Emergency Use Authorization (EUA). This EUA will remain  in effect (meaning this test can be used) for the duration of the COVID-19 declaration under Section 564(b)(1) of the Act, 21 U.S.C.section 360bbb-3(b)(1), unless the authorization is terminated  or revoked sooner.       Influenza A by PCR  NEGATIVE NEGATIVE Final   Influenza B by PCR NEGATIVE NEGATIVE Final    Comment: (NOTE) The Xpert Xpress SARS-CoV-2/FLU/RSV plus assay is intended as an aid in the diagnosis of influenza from Nasopharyngeal swab specimens and should not be used as a sole basis for treatment. Nasal washings and aspirates are unacceptable for Xpert Xpress SARS-CoV-2/FLU/RSV testing.  Fact Sheet for Patients: EntrepreneurPulse.com.au  Fact Sheet for Healthcare Providers: IncredibleEmployment.be  This test is not yet approved or cleared by the [place] FDA and has been authorized for detection and/or diagnosis of SARS-CoV-2 by FDA under an Emergency Use Authorization (EUA). This EUA will remain in effect (meaning this test can be used) for the duration of the COVID-19 declaration under Section 564(b)(1) of the Act, 21 U.S.C. section 360bbb-3(b)(1), unless the authorization is terminated or revoked.  Performed at [facility], [address] [place], [place] [address]   MRSA Next Gen by PCR, Nasal     Status: None   Collection Time: 11/18/21  8:58 AM   Specimen: Nasal Mucosa; Nasal Swab  Result Value Ref Range Status   MRSA by PCR Next Gen NOT DETECTED NOT DETECTED Final    Comment: (NOTE) The GeneXpert MRSA Assay (FDA approved for NASAL specimens only), is one component of a comprehensive MRSA colonization surveillance program. It is not intended to diagnose MRSA infection nor to guide or monitor treatment for MRSA infections. Test performance is not FDA approved  in patients less than 3 years old. Performed at [facility], [place] [address], [place], [place] [address]      Time coordinating discharge: Over 30 minutes  SIGNED:   Little Ishikawa, DO [facility] 11/19/2021, 1:20 PM Pager   If 7PM-7AM, please contact night-coverage [email or web address]

## 2021-11-19 NOTE — Telephone Encounter (Signed)
Patient's wife has been made aware and verbalized her understanding. Hydralazine 50 mg bid has been sent in. Amlodipine has been discontinued. She will keep a log of his blood pressures and call back with any concerns.   Lelon Perla, MD  You 6 minutes ago (3:57 PM)   DC amlodipine; hydralazine 50 mg BID; follow BP  Kirk Ruths

## 2021-11-19 NOTE — Progress Notes (Signed)
°  Transition of Care [facility]) Screening Note   Patient Details  Name: [name] Date of Birth: [birth year]   Transition of Care [facility]) CM/SW Contact:    Tom-Johnson, Renea Ee, RN Phone Number: 11/19/2021, 3:04 PM  Patient is scheduled for discharge today. From home with wife and no children. Admitted for Hypertensive emergency. Independent with care and drives self prior to admission. Sates he does not have and does not need any DME's. PCP is Carollee Herter, Alferd Apa, DO and uses [facility] on [address]. Transition of Care Department [facility]) has reviewed patient and no TOC recommendations has been identified at this time. Wife to transport at discharge. No further TOC needs noted.

## 2021-11-19 NOTE — Telephone Encounter (Signed)
Pt c/o medication issue:  1. Name of Medication: amLODipine (NORVASC) 5 MG tablet  2. How are you currently taking this medication (dosage and times per day)? Take 1 tablet (5 mg total) by mouth daily  3. Are you having a reaction (difficulty breathing--STAT)? no  4. What is your medication issue? Wife is calling in because they patient has been discharge from hospital and they sent him home with that prescription. But wife states the last time he took that he was swollen real bad. Calling to see what Dr Stanford Breed thinks they should do

## 2021-11-19 NOTE — Telephone Encounter (Signed)
Spoke to the patient's wife, per the DPR. The patient was seen in the hospital for elevated blood pressure. The patient was started on Amlodipine 5 mg. The patient was on it in 2020 and had bad extremity edema. The wife is afraid to start him back on this.  She would like to have Dr. Jacalyn Lefevre recommendations. She does not want to pick up the prescription at the pharmacy if he is not going to be on it.   He has an appointment with PCP on 11/23/2021

## 2021-11-22 ENCOUNTER — Telehealth: Payer: Self-pay

## 2021-11-22 NOTE — Telephone Encounter (Signed)
Transition Care Management Unsuccessful Follow-up Telephone Call  Date of discharge and from where:  11/19/2021-  Attempts:  1st Attempt  Reason for unsuccessful TCM follow-up call:  No answer/busy

## 2021-11-23 ENCOUNTER — Encounter: Payer: Self-pay | Admitting: Family Medicine

## 2021-11-23 ENCOUNTER — Other Ambulatory Visit: Payer: Self-pay

## 2021-11-23 ENCOUNTER — Ambulatory Visit (INDEPENDENT_AMBULATORY_CARE_PROVIDER_SITE_OTHER): Payer: Medicare Other | Admitting: Family Medicine

## 2021-11-23 ENCOUNTER — Ambulatory Visit (HOSPITAL_BASED_OUTPATIENT_CLINIC_OR_DEPARTMENT_OTHER)
Admission: RE | Admit: 2021-11-23 | Discharge: 2021-11-23 | Disposition: A | Discharge status: Home / Self Care | Payer: Medicare Other | Source: Ambulatory Visit | Attending: Family Medicine | Admitting: Family Medicine

## 2021-11-23 VITALS — BP 150/98 | HR 56 | Temp 97.4°F | Resp 18 | Ht 69.0 in | Wt 155.4 lb

## 2021-11-23 DIAGNOSIS — I2510 Atherosclerotic heart disease of native coronary artery without angina pectoris: Secondary | ICD-10-CM | POA: Diagnosis not present

## 2021-11-23 DIAGNOSIS — Z9861 Coronary angioplasty status: Secondary | ICD-10-CM

## 2021-11-23 DIAGNOSIS — E039 Hypothyroidism, unspecified: Secondary | ICD-10-CM

## 2021-11-23 DIAGNOSIS — D696 Thrombocytopenia, unspecified: Secondary | ICD-10-CM

## 2021-11-23 DIAGNOSIS — E1169 Type 2 diabetes mellitus with other specified complication: Secondary | ICD-10-CM

## 2021-11-23 DIAGNOSIS — M545 Low back pain, unspecified: Secondary | ICD-10-CM | POA: Diagnosis not present

## 2021-11-23 DIAGNOSIS — R351 Nocturia: Secondary | ICD-10-CM | POA: Diagnosis not present

## 2021-11-23 DIAGNOSIS — E1151 Type 2 diabetes mellitus with diabetic peripheral angiopathy without gangrene: Secondary | ICD-10-CM

## 2021-11-23 DIAGNOSIS — G8929 Other chronic pain: Secondary | ICD-10-CM | POA: Insufficient documentation

## 2021-11-23 DIAGNOSIS — E785 Hyperlipidemia, unspecified: Secondary | ICD-10-CM | POA: Diagnosis not present

## 2021-11-23 DIAGNOSIS — R8290 Unspecified abnormal findings in urine: Secondary | ICD-10-CM

## 2021-11-23 DIAGNOSIS — N401 Enlarged prostate with lower urinary tract symptoms: Secondary | ICD-10-CM | POA: Diagnosis not present

## 2021-11-23 DIAGNOSIS — H903 Sensorineural hearing loss, bilateral: Secondary | ICD-10-CM

## 2021-11-23 DIAGNOSIS — C439 Malignant melanoma of skin, unspecified: Secondary | ICD-10-CM | POA: Diagnosis not present

## 2021-11-23 DIAGNOSIS — I209 Angina pectoris, unspecified: Secondary | ICD-10-CM

## 2021-11-23 DIAGNOSIS — I10 Essential (primary) hypertension: Secondary | ICD-10-CM | POA: Diagnosis not present

## 2021-11-23 DIAGNOSIS — D702 Other drug-induced agranulocytosis: Secondary | ICD-10-CM | POA: Diagnosis not present

## 2021-11-23 LAB — CBC WITH DIFFERENTIAL/PLATELET
Basophils Absolute: 0.1 10*3/uL (ref 0.0–0.1)
Basophils Relative: 1.2 % (ref 0.0–3.0)
Eosinophils Absolute: 0.5 10*3/uL (ref 0.0–0.7)
Eosinophils Relative: 8.9 % — ABNORMAL HIGH (ref 0.0–5.0)
HCT: 48.6 % (ref 39.0–52.0)
Hemoglobin: 16.5 g/dL (ref 13.0–17.0)
Lymphocytes Relative: 16.7 % (ref 12.0–46.0)
Lymphs Abs: 0.9 10*3/uL (ref 0.7–4.0)
MCHC: 34.1 g/dL (ref 30.0–36.0)
MCV: 95.6 fl (ref 78.0–100.0)
Monocytes Absolute: 0.5 10*3/uL (ref 0.1–1.0)
Monocytes Relative: 9.3 % (ref 3.0–12.0)
Neutro Abs: 3.3 10*3/uL (ref 1.4–7.7)
Neutrophils Relative %: 63.9 % (ref 43.0–77.0)
Platelets: 130 10*3/uL — ABNORMAL LOW (ref 150.0–400.0)
RBC: 5.08 Mil/uL (ref 4.22–5.81)
RDW: 13.8 % (ref 11.5–15.5)
WBC: 5.2 10*3/uL (ref 4.0–10.5)

## 2021-11-23 LAB — PSA: PSA: 10.44 ng/mL — ABNORMAL HIGH (ref 0.10–4.00)

## 2021-11-23 LAB — POC URINALSYSI DIPSTICK (AUTOMATED)
Bilirubin, UA: NEGATIVE
Blood, UA: NEGATIVE
Glucose, UA: NEGATIVE
Ketones, UA: NEGATIVE
Nitrite, UA: POSITIVE
Protein, UA: NEGATIVE
Spec Grav, UA: 1.01 (ref 1.010–1.025)
Urobilinogen, UA: 0.2 E.U./dL
pH, UA: 7 (ref 5.0–8.0)

## 2021-11-23 LAB — MICROALBUMIN / CREATININE URINE RATIO
Creatinine,U: 72.4 mg/dL
Microalb Creat Ratio: 5 mg/g (ref 0.0–30.0)
Microalb, Ur: 3.6 mg/dL — ABNORMAL HIGH (ref 0.0–1.9)

## 2021-11-23 LAB — TSH: TSH: 2.43 u[IU]/mL (ref 0.35–5.50)

## 2021-11-23 MED ORDER — SIMVASTATIN 40 MG PO TABS: 40.0000 mg | ORAL_TABLET | Freq: Every day | ORAL | 3 refills | Status: DC

## 2021-11-23 NOTE — Assessment & Plan Note (Signed)
Check hgba1c  Home bs good per pt

## 2021-11-23 NOTE — Assessment & Plan Note (Signed)
F/u dermatology

## 2021-11-23 NOTE — Assessment & Plan Note (Signed)
Per cardiology Check labs  

## 2021-11-23 NOTE — Progress Notes (Signed)
Subjective:   By signing my name below, I, [name], attest that this documentation has been prepared under the direction and in the presence of Kevin Held, DO  11/23/2021     Patient ID: [name], male    DOB: 02-28-1946, 76 y.o.   MRN: [id number]  Chief Complaint  Patient presents with   Hospitalization Follow-up    HTN emergency    HPI Patient is in today for a office visit.  He was admitted into the hospital on 11/17/2021 for hypertensive emergency. His blood pressure measured 195/97 at home which prompted him to see the emergency room. He notes having a migraine at the same time. He also reports having difficulty speaking prior to seeing the emergency room. He was recommended to follow up with his cardiologist but did not schedule an appointment at this time.  His blood pressure is elevated during this visit. He started taking 50 mg hydralazine 2x daily PO a couple days ago and reports no new issues while taking it. He continues taking 100 mg losartan daily PO.  BP Readings from Last 3 Encounters:  11/23/21 (!) 150/98  11/19/21 (!) 165/76  11/08/21 (!) 152/86   Pulse Readings from Last 3 Encounters:  11/23/21 (!) 56  11/19/21 (!) 59  11/08/21 62   He complains of passing small hard and painful deposits in his urine. He stopped taking 40 mg atorvastatin daily PO for a week due to thinking the calcium in the medication was affecting it. He reports his symptoms have improved after he stopped taking it. He resumed taking it after a week and found his symptoms returned. He is requesting to switch to another medication. He has an upcomming appointment with his urologist next month and is checking his PSA levels during that visit. He is interested in checking his PSA levels during this visit.  He continues having spontaneous migraines. He notes his last episode of migraines was last week. He continues taking 50 mg Topamax daily PO, cefaly to manage his migraines.   He continues having back pain. He notes having extra skin on one side of his back and thinks its due to his back curving and favoring one side over the other.   Past Medical History:  Diagnosis Date   Abdominal bruit 07/22/2010   Acute sinusitis 03/26/2015   Acute suppr otitis media w spon rupt ear drum, recur, r ear 04/06/2017   Angina pectoris 12/11/2008   Benign essential tremor 08/29/2017   Bilateral sensorineural hearing loss 11/30/2015   CAD S/P percutaneous coronary angioplasty 12/11/2008   LAD PCI '03,  Cath 2010 showed 40% LAD- medical Rx Myoview March 2020 negative for ischemia but positive for ST depression- Imdur added.  Formatting of this note might be different from the original. LAD PCI '03,  Cath 2010 showed 40% LAD- medical Rx Myoview March 2020 negative for ischemia but positive for ST depression- Imdur added.  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 Echo May 2020- EF  60-65% with mild LVH  Last Assessment & Plan:  Well controlled, no changes to meds. Encouraged heart healthy diet such as the DASH diet and exercise as tolerated.   Eustachian tube dysfunction, bilateral 11/30/2015   History of colonic polyps 07/12/2010   History of GI bleed 04/23/2020   History of left mastoidectomy 02/20/2020   History of placement of ear tubes    Hyperlipidemia associated with type 2 diabetes mellitus 07/10/2007   Hyperthyroidism 07/12/2010   Hypothyroidism 07/10/2007   Internal hemorrhoids    Intractable  episodic cluster headache 08/29/2017   Major depressive disorder 07/10/2007   Mastoiditis of both sides 09/21/2016   Migraine headache    Mild neurocognitive disorder due to multiple etiologies 12/25/2020   Non-insulin dependent type 2 diabetes mellitus 07/06/2010   Osteoarthritis of multiple joints 07/10/2007   Perennial allergic rhinitis 09/22/2016   Postprocedural urinary retention    Presbycusis of both ears 01/23/2018   Skin cancer, basal cell    Thrombocytopenia    TIA (transient ischemic attack) 2006   per patient's report. He was never officially given this diagnosis   Upper airway cough syndrome 09/25/2017    Past Surgical History:  Procedure Laterality Date   COLONOSCOPY  2011   coronary artery disease status post placement     of drug-eluting stent in the LAD in 2003,eEF 65% then   [place] N/A 12/08/2020   Procedure: CLOSURE LEFT POST AURICULAR SCALP WOUND;  Surgeon: Irene Limbo, MD;  Location: [place];  Service: Plastics;  Laterality: N/A;   esophogeal dilation     fatty tissue removed     from neck 2003   HERNIA REPAIR  2007   mastoidectomy with tympanoplasty     MELANOMA EXCISION Left 11/17/2020   Procedure: WIDE LOCAL EXCISION, ADVANCED FLAP CLOSURE LEFT POSTERIOR EAR MELANOMA;  Surgeon: Stark Klein, MD;  Location: [place];  Service: General;  Laterality: Left;   MELANOMA EXCISION Left 12/01/2020   Procedure: RE-EXCISION LEFT POSTERIOR AURICULAR MELANOMA WITH TEMPORARY CLOSURE;  Surgeon: Stark Klein, MD;  Location: [place];  Service: General;  Laterality: Left;  60 MIN TOTAL   POLYPECTOMY  2011   +TA   right toe bone spur surgery     SKIN FULL THICKNESS GRAFT Left 12/08/2020   Procedure: Full thickness skin graft from left upper arm;  Surgeon: Irene Limbo, MD;  Location: [place];  Service: Plastics;  Laterality: Left;   THYROIDECTOMY, PARTIAL  07/2002   right thyroid   TONSILLECTOMY      UPPER GI ENDOSCOPY      Family History  Problem Relation Age of Onset   Heart disease Mother    Alzheimer's disease Mother    Heart disease Father        triple by pass   Hypertension Brother        Prostate Cancer   Prostate cancer Brother    Hypertension Brother    Hypertension Brother    COPD Brother    Colon cancer Neg Hx    Colon polyps Neg Hx    Rectal cancer Neg Hx    Stomach cancer Neg Hx    Esophageal cancer Neg Hx     Social History   Socioeconomic History   Marital status: Married    Spouse name: Charleen   Number of children: 0   Years of education: 14   Highest education level: Associate degree: occupational, Hotel manager, or vocational program  Occupational  History   Occupation: retired    Fish farm manager: RETIRED    Comment: accountant  Tobacco Use   Smoking status: Former    Years: 5.00    Types: Cigarettes    Quit date: 10/10/1965    Years since quitting: 56.1   Smokeless tobacco: Never  Vaping Use   Vaping Use: Never used  Substance and Sexual Activity   Alcohol use: No   Drug use: No   Sexual activity: Yes    Partners: Female  Other Topics Concern   Not on file  Social History Narrative   Pt lives with spouse at private home in 2 story home- he has no children- right handed- he drinks coffee daily, tea sometimes, soda not all the time. Exercise-- treadmill, stationary bike   Right handed   Social Determinants of Health   Financial Resource Strain: Low Risk    Difficulty of Paying Living Expenses: Not hard at all  Food Insecurity: No Food Insecurity   Worried About Charity fundraiser in the Last Year: Never true   Ran Out of Food in the Last Year: Never true  Transportation Needs: No Transportation Needs   Lack of Transportation (Medical): No   Lack of Transportation (Non-Medical): No  Physical Activity: Sufficiently Active   Days of Exercise per Week: 7 days   Minutes of Exercise per Session: 30 min  Stress: No Stress Concern Present    Feeling of Stress : Not at all  Social Connections: Moderately Integrated   Frequency of Communication with Friends and Family: Three times a week   Frequency of Social Gatherings with Friends and Family: More than three times a week   Attends Religious Services: More than 4 times per year   Active Member of [employer] or Organizations: No   Attends Archivist Meetings: Never   Marital Status: Married  Human resources officer Violence: Not At Risk   Fear of Current or Ex-Partner: No   Emotionally Abused: No   Physically Abused: No   Sexually Abused: No    Outpatient Medications Prior to Visit  Medication Sig Dispense Refill   aspirin 81 MG tablet Take 81 mg by mouth daily.     azelastine (ASTELIN) 0.1 % nasal spray Place 1 spray into both nostrils daily as needed for rhinitis. Use in each nostril as directed     Biotin 1000 MCG tablet Take 1,000 mcg by mouth 2 (two) times daily.     Boswellia Serrata (BOSWELLIA PO) Take 500 mg by mouth in the morning and at bedtime.     celecoxib (CELEBREX) 200 MG capsule Take 1 capsule (200 mg total) by mouth daily. 90 capsule 3   cholecalciferol (VITAMIN D) 25 MCG (1000 UNIT) tablet Take 3,000 Units by mouth 3 (three) times daily.     Cinnamon 500 MG capsule Take 1,000 mg by mouth 2 (two) times daily.     Coenzyme Q10 (COQ-10) 100 MG capsule Take 1 capsule (100 mg total) by mouth daily.     Cyanocobalamin (VITAMIN B-12) 1000 MCG SUBL Take 1,000 mcg by mouth daily.     Diclofenac Sodium 3 % GEL Qid prn (Patient taking differently: Apply 1 application topically daily as needed (Arthritis).) 100 g 3   ezetimibe (ZETIA) 10 MG tablet Take 1 tablet (10 mg total) by mouth daily. 90 tablet 3   ferrous sulfate 325 (65 FE) MG tablet Take 325 mg by mouth daily with breakfast.     Ginger, Zingiber officinalis, (GINGER ROOT) 550  MG CAPS Take 550 mg by mouth 2 (two) times daily.     hydrALAZINE (APRESOLINE) 50 MG tablet Take 1 tablet (50 mg total) by mouth 2 (two)  times daily. 60 tablet 3   levothyroxine (SYNTHROID) 112 MCG tablet Take 1 tablet (112 mcg total) by mouth daily before breakfast. 90 tablet 1   losartan (COZAAR) 100 MG tablet Take 1 tablet (100 mg total) by mouth daily. 90 tablet 3   Multiple Vitamins-Minerals (THERAGRAN-M ADVANCED 50 PLUS PO) Take 1 tablet by mouth daily.     Nerve Stimulator (CEFALY KIT) DEVI Apply 1 application topically daily at 12 noon. Electrical migraine kit for prevention on treatment of migraine headache     OVER THE COUNTER MEDICATION Take 1 capsule by mouth 2 (two) times daily. Migra-eeze gel cap     pantoprazole (PROTONIX) 40 MG tablet TAKE ONE TABLET BY MOUTH DAILY 90 tablet 1   propranolol ER (INDERAL LA) 160 MG SR capsule Take 1 capsule (160 mg total) by mouth daily. One tablet po qd 90 capsule 1   tamsulosin (FLOMAX) 0.4 MG CAPS capsule Take 0.4 mg by mouth in the morning and at bedtime. 30 capsule    topiramate (TOPAMAX) 50 MG tablet TAKE ONE TABLET BY MOUTH EVERY NIGHT AT BEDTIME 30 tablet 4   triamcinolone (KENALOG) 0.1 % Apply 1 application topically daily.     atorvastatin (LIPITOR) 40 MG tablet TAKE ONE TABLET BY MOUTH DAILY AT 6PM 90 tablet 3   COVID-19 mRNA bivalent vaccine, Pfizer, (PFIZER COVID-19 VAC BIVALENT) injection Inject into the muscle. (Patient not taking: Reported on 11/23/2021) 0.3 mL 0   metFORMIN (GLUCOPHAGE) 500 MG tablet TAKE ONE TABLET BY MOUTH TWICE A DAY WITH A MEAL (Patient not taking: Reported on 11/18/2021) 180 tablet 1   No facility-administered medications prior to visit.    Allergies  Allergen Reactions   Sulfa Antibiotics Other (See Comments)    Severe crippling  joint and muscle pain and redness   Sulfamethoxazole-Trimethoprim Other (See Comments)    Joint and muscle pain   Sulfasalazine Other (See Comments)    Joint and muscle pain   Amlodipine Swelling   Codeine Other (See Comments)    Unknown   Crestor [Rosuvastatin Calcium] Other (See Comments)    Severe joint and  muscle pain   Hydrocodone-Acetaminophen Other (See Comments)    To strong vicodin    Other     Apples and tomatoes...... Nausea and migraines.     Rosuvastatin Other (See Comments)    REACTION: REALLY BAD JOINT/MUSCLE PAIN    Review of Systems  Constitutional:  Negative for fever and malaise/fatigue.  HENT:  Negative for congestion.   Eyes:  Negative for blurred vision.  Respiratory:  Negative for shortness of breath.   Cardiovascular:  Negative for chest pain, palpitations and leg swelling.  Gastrointestinal:  Negative for abdominal pain, blood in stool and nausea.  Genitourinary:  Negative for dysuria and frequency.       (+)small hard deposits in uringe  Musculoskeletal:  Positive for back pain. Negative for falls.  Skin:  Negative for rash.  Neurological:  Positive for headaches (spontaneuous migraines). Negative for dizziness and loss of consciousness.  Endo/Heme/Allergies:  Negative for environmental allergies.  Psychiatric/Behavioral:  Negative for depression. The patient is not nervous/anxious.       Objective:    Physical Exam Vitals and nursing note reviewed.  Constitutional:      General: He is not in acute distress.  Appearance: Normal appearance. He is not ill-appearing.  HENT:     Head: Normocephalic and atraumatic.     Right Ear: External ear normal.     Left Ear: External ear normal.  Eyes:     Extraocular Movements: Extraocular movements intact.     Pupils: Pupils are equal, round, and reactive to light.  Cardiovascular:     Rate and Rhythm: Normal rate and regular rhythm.     Heart sounds: Normal heart sounds. No murmur heard.   No gallop.  Pulmonary:     Effort: Pulmonary effort is normal. No respiratory distress.     Breath sounds: Normal breath sounds. No wheezing or rales.  Musculoskeletal:     Comments: Slight curvature convex to the left on lumbar spine.  Skin:    General: Skin is warm and dry.  Neurological:     Mental Status: He is  alert and oriented to person, place, and time.  Psychiatric:        Behavior: Behavior normal.    BP (!) 150/98 (BP Location: Left Arm, Patient Position: Sitting, Cuff Size: Normal)    Pulse (!) 56    Temp (!) 97.4 F (36.3 C) (Oral)    Resp 18    Ht 5' 9" (1.753 m)    Wt 155 lb 6.4 oz (70.5 kg)    SpO2 98%    BMI 22.95 kg/m  Wt Readings from Last 3 Encounters:  11/23/21 155 lb 6.4 oz (70.5 kg)  11/18/21 147 lb 14.9 oz (67.1 kg)  11/08/21 155 lb (70.3 kg)    Diabetic Foot Exam - Simple   No data filed    Lab Results  Component Value Date   WBC 6.7 11/19/2021   HGB 15.8 11/19/2021   HCT 44.5 11/19/2021   PLT 128 (L) 11/19/2021   GLUCOSE 104 (H) 11/19/2021   CHOL 83 05/13/2021   TRIG 56.0 05/13/2021   HDL 42.10 05/13/2021   LDLCALC 30 05/13/2021   ALT 16 05/13/2021   AST 18 05/13/2021   NA 138 11/19/2021   K 3.7 11/19/2021   CL 105 11/19/2021   CREATININE 1.52 (H) 11/19/2021   BUN 27 (H) 11/19/2021   CO2 24 11/19/2021   TSH 2.37 11/10/2020   PSA 5.08 (H) 10/18/2018   INR 1.0 07/01/2019   HGBA1C 4.9 11/18/2021   MICROALBUR 3.2 (H) 05/13/2021    Lab Results  Component Value Date   TSH 2.37 11/10/2020   Lab Results  Component Value Date   WBC 6.7 11/19/2021   HGB 15.8 11/19/2021   HCT 44.5 11/19/2021   MCV 92.7 11/19/2021   PLT 128 (L) 11/19/2021   Lab Results  Component Value Date   NA 138 11/19/2021   K 3.7 11/19/2021   CHLORIDE 107 01/05/2017   CO2 24 11/19/2021   GLUCOSE 104 (H) 11/19/2021   BUN 27 (H) 11/19/2021   CREATININE 1.52 (H) 11/19/2021   BILITOT 0.6 05/13/2021   ALKPHOS 56 05/13/2021   AST 18 05/13/2021   ALT 16 05/13/2021   PROT 6.1 05/13/2021   ALBUMIN 3.8 05/13/2021   CALCIUM 8.8 (L) 11/19/2021   ANIONGAP 9 11/19/2021   EGFR 55 (L) 01/05/2017   GFR 53.55 (L) 05/13/2021   Lab Results  Component Value Date   CHOL 83 05/13/2021   Lab Results  Component Value Date   HDL 42.10 05/13/2021   Lab Results  Component Value Date    LDLCALC 30 05/13/2021   Lab Results  Component  Value Date   TRIG 56.0 05/13/2021   Lab Results  Component Value Date   CHOLHDL 2 05/13/2021   Lab Results  Component Value Date   HGBA1C 4.9 11/18/2021       Assessment & Plan:   Problem List Items Addressed This Visit       Unprioritized   Angina pectoris   Relevant Medications   simvastatin (ZOCOR) 40 MG tablet   Drug-induced neutropenia   Thrombocytopenia (HCC)   Bilateral sensorineural hearing loss    Has hearing aids      CAD S/P percutaneous coronary angioplasty    Per cardiology Check labs      Relevant Medications   simvastatin (ZOCOR) 40 MG tablet   Cutaneous melanoma    F/u dermatology      Essential hypertension    Well controlled, no changes to meds. Encouraged heart healthy diet such as the DASH diet and exercise as tolerated.        Relevant Medications   simvastatin (ZOCOR) 40 MG tablet   Hyperlipidemia associated with type 2 diabetes mellitus ([place])    Check labs  con't meds       Relevant Medications   simvastatin (ZOCOR) 40 MG tablet   Other Relevant Orders   CBC with Differential/Platelet   Comprehensive metabolic panel   Lipid panel   Microalbumin / creatinine urine ratio   Hypothyroidism - Primary    Stable Check labs  con't synthroid      Relevant Orders   CBC with Differential/Platelet   TSH   Type 2 diabetes mellitus with diabetic peripheral angiopathy without gangrene, without long-term current use of insulin (HCC)    Check hgba1c  Home bs good per pt      Relevant Medications   simvastatin (ZOCOR) 40 MG tablet   Other Visit Diagnoses     Primary hypertension       Relevant Medications   simvastatin (ZOCOR) 40 MG tablet   Other Relevant Orders   CBC with Differential/Platelet   Comprehensive metabolic panel   Microalbumin / creatinine urine ratio   Type 2 diabetes mellitus with other specified complication, without long-term current use of insulin (HCC)        Relevant Medications   simvastatin (ZOCOR) 40 MG tablet   Other Relevant Orders   Comprehensive metabolic panel   Lipid panel   Nocturia       Relevant Orders   PSA   BPH associated with nocturia       Relevant Orders   PSA   Chronic left-sided low back pain without sciatica       Relevant Orders   POCT Urinalysis Dipstick (Automated) (Completed)   DG SCOLIOSIS EVAL COMPLETE SPINE 1 VIEW   Abnormal urine       Abnormal urine finding       Relevant Orders   Urine Culture        Meds ordered this encounter  Medications   simvastatin (ZOCOR) 40 MG tablet    Sig: Take 1 tablet (40 mg total) by mouth at bedtime.    Dispense:  90 tablet    Refill:  3    I, Kevin Held, DO, personally preformed the services described in this documentation.  All medical record entries made by the scribe were at my direction and in my presence.  I have reviewed the chart and discharge instructions (if applicable) and agree that the record reflects my personal performance and is accurate and complete. 11/23/2021  I,[name],acting as a Education administrator for [employer], DO.,have documented all relevant documentation on the behalf of Kevin Held, DO,as directed by  Kevin Held, DO while in the presence of Kevin Held, DO.   Kevin Held, DO

## 2021-11-23 NOTE — Patient Instructions (Signed)

## 2021-11-23 NOTE — Assessment & Plan Note (Signed)
Has hearing aids.

## 2021-11-23 NOTE — Assessment & Plan Note (Signed)
Well controlled, no changes to meds. Encouraged heart healthy diet such as the DASH diet and exercise as tolerated.  °

## 2021-11-23 NOTE — Assessment & Plan Note (Signed)
Check labs con't meds 

## 2021-11-23 NOTE — Assessment & Plan Note (Signed)
Stable Check labs  con't synthroid

## 2021-11-24 ENCOUNTER — Other Ambulatory Visit: Payer: Self-pay

## 2021-11-24 DIAGNOSIS — M5030 Other cervical disc degeneration, unspecified cervical region: Secondary | ICD-10-CM

## 2021-11-24 LAB — COMPREHENSIVE METABOLIC PANEL
ALT: 20 U/L (ref 0–53)
AST: 20 U/L (ref 0–37)
Albumin: 4.5 g/dL (ref 3.5–5.2)
Alkaline Phosphatase: 70 U/L (ref 39–117)
BUN: 28 mg/dL — ABNORMAL HIGH (ref 6–23)
CO2: 29 mEq/L (ref 19–32)
Calcium: 9.3 mg/dL (ref 8.4–10.5)
Chloride: 104 mEq/L (ref 96–112)
Creatinine, Ser: 1.36 mg/dL (ref 0.40–1.50)
GFR: 50.08 mL/min — ABNORMAL LOW (ref >60.00)
Glucose, Bld: 99 mg/dL (ref 70–99)
Potassium: 4.2 mEq/L (ref 3.5–5.1)
Sodium: 140 mEq/L (ref 135–145)
Total Bilirubin: 0.7 mg/dL (ref 0.2–1.2)
Total Protein: 7.9 g/dL (ref 6.0–8.3)

## 2021-11-24 LAB — LIPID PANEL
Cholesterol: 138 mg/dL (ref 0–200)
HDL: 61.9 mg/dL (ref >39.00)
LDL Cholesterol: 65 mg/dL (ref 0–99)
NonHDL: 76.05
Total CHOL/HDL Ratio: 2
Triglycerides: 53 mg/dL (ref 0.0–149.0)
VLDL: 10.6 mg/dL (ref 0.0–40.0)

## 2021-11-24 MED ORDER — CIPROFLOXACIN HCL 500 MG PO TABS: 500.0000 mg | ORAL_TABLET | Freq: Two times a day (BID) | ORAL | 0 refills | Status: DC

## 2021-11-26 ENCOUNTER — Other Ambulatory Visit: Payer: Self-pay

## 2021-11-26 DIAGNOSIS — R809 Proteinuria, unspecified: Secondary | ICD-10-CM

## 2021-11-26 DIAGNOSIS — N289 Disorder of kidney and ureter, unspecified: Secondary | ICD-10-CM

## 2021-11-26 LAB — URINE CULTURE
MICRO NUMBER:: 13006538
SPECIMEN QUALITY:: ADEQUATE

## 2021-11-29 ENCOUNTER — Other Ambulatory Visit: Payer: Self-pay

## 2021-11-29 DIAGNOSIS — M545 Low back pain, unspecified: Secondary | ICD-10-CM | POA: Diagnosis not present

## 2021-11-29 DIAGNOSIS — M5136 Other intervertebral disc degeneration, lumbar region: Secondary | ICD-10-CM | POA: Diagnosis not present

## 2021-11-29 DIAGNOSIS — M542 Cervicalgia: Secondary | ICD-10-CM | POA: Diagnosis not present

## 2021-11-29 MED ORDER — NITROFURANTOIN MONOHYD MACRO 100 MG PO CAPS: 100.0000 mg | ORAL_CAPSULE | Freq: Two times a day (BID) | ORAL | 0 refills | Status: DC

## 2021-12-06 DIAGNOSIS — S335XXD Sprain of ligaments of lumbar spine, subsequent encounter: Secondary | ICD-10-CM | POA: Diagnosis not present

## 2021-12-06 DIAGNOSIS — M47816 Spondylosis without myelopathy or radiculopathy, lumbar region: Secondary | ICD-10-CM | POA: Diagnosis not present

## 2021-12-08 DIAGNOSIS — Z9889 Other specified postprocedural states: Secondary | ICD-10-CM | POA: Diagnosis not present

## 2021-12-08 DIAGNOSIS — H90A32 Mixed conductive and sensorineural hearing loss, unilateral, left ear with restricted hearing on the contralateral side: Secondary | ICD-10-CM | POA: Diagnosis not present

## 2021-12-08 DIAGNOSIS — H90A21 Sensorineural hearing loss, unilateral, right ear, with restricted hearing on the contralateral side: Secondary | ICD-10-CM | POA: Diagnosis not present

## 2021-12-12 ENCOUNTER — Other Ambulatory Visit: Payer: Self-pay | Admitting: Family Medicine

## 2021-12-12 ENCOUNTER — Other Ambulatory Visit: Payer: Self-pay | Admitting: Neurology

## 2021-12-12 DIAGNOSIS — I10 Essential (primary) hypertension: Secondary | ICD-10-CM

## 2021-12-13 DIAGNOSIS — S335XXD Sprain of ligaments of lumbar spine, subsequent encounter: Secondary | ICD-10-CM | POA: Diagnosis not present

## 2021-12-13 DIAGNOSIS — M47816 Spondylosis without myelopathy or radiculopathy, lumbar region: Secondary | ICD-10-CM | POA: Diagnosis not present

## 2021-12-19 ENCOUNTER — Encounter: Payer: Self-pay | Admitting: Family Medicine

## 2021-12-19 DIAGNOSIS — N390 Urinary tract infection, site not specified: Secondary | ICD-10-CM

## 2021-12-20 ENCOUNTER — Other Ambulatory Visit: Payer: Medicare Other

## 2021-12-20 DIAGNOSIS — M47816 Spondylosis without myelopathy or radiculopathy, lumbar region: Secondary | ICD-10-CM | POA: Diagnosis not present

## 2021-12-20 DIAGNOSIS — N390 Urinary tract infection, site not specified: Secondary | ICD-10-CM | POA: Diagnosis not present

## 2021-12-20 DIAGNOSIS — S335XXD Sprain of ligaments of lumbar spine, subsequent encounter: Secondary | ICD-10-CM | POA: Diagnosis not present

## 2021-12-20 NOTE — Progress Notes (Signed)
Office Visit    Patient Name: [name] Date of Encounter: 12/22/2021  Primary Care Provider:  Zola Button, Grayling Congress, DO Primary Cardiologist:  Olga Millers, MD  Chief Complaint    76 year old male with a history of CAD, hypertension, hyperlipidemia, bilateral carotid artery stenosis, bilateral sensorineural hearing loss, anemia and type 2 diabetes who presents for follow-up related to CAD.   Past Medical History    Past Medical History:  Diagnosis Date   Abdominal bruit 07/22/2010   Acute sinusitis 03/26/2015   Acute suppr otitis media w spon rupt ear drum, recur, r ear 04/06/2017   Angina pectoris 12/11/2008   Benign essential tremor 08/29/2017   Bilateral sensorineural hearing loss 11/30/2015   CAD S/P percutaneous coronary angioplasty 12/11/2008   LAD PCI '03,  Cath 2010 showed 40% LAD- medical Rx Myoview March 2020 negative for ischemia but positive for ST depression- Imdur added.  Formatting of this note might be different from the original. LAD PCI '03,  Cath 2010 showed 40% LAD- medical Rx Myoview March 2020 negative for ischemia but positive for ST depression- Imdur added.  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 Echo May 2020- EF 60-65% with mild LVH  Last Assessment & Plan:  Well controlled, no changes to meds. Encouraged heart healthy diet such as the DASH diet and exercise as tolerated.   Eustachian tube dysfunction, bilateral 11/30/2015   History of colonic polyps 07/12/2010   History  of GI bleed 04/23/2020   History of left mastoidectomy 02/20/2020   History of placement of ear tubes    Hyperlipidemia associated with type 2 diabetes mellitus 07/10/2007   Hyperthyroidism 07/12/2010   Hypothyroidism 07/10/2007   Internal hemorrhoids    Intractable episodic cluster headache 08/29/2017   Major depressive disorder 07/10/2007   Mastoiditis of both sides 09/21/2016   Migraine headache    Mild neurocognitive disorder due to multiple etiologies 12/25/2020   Non-insulin dependent type 2 diabetes mellitus 07/06/2010   Osteoarthritis of multiple joints 07/10/2007   Perennial allergic rhinitis 09/22/2016   Postprocedural urinary retention    Presbycusis of both ears 01/23/2018   Skin cancer, basal cell    Thrombocytopenia    TIA (transient ischemic attack) 2006   per patient's report. He was never officially given this diagnosis   Upper airway cough syndrome 09/25/2017   Past Surgical History:  Procedure Laterality Date   COLONOSCOPY  2011   coronary artery disease status post placement     of drug-eluting stent in the LAD in 2003,eEF 65% then   DEBRIDEMENT AND CLOSURE WOUND N/A 12/08/2020   Procedure: CLOSURE LEFT POST AURICULAR SCALP WOUND;  Surgeon: Glenna Fellows, MD;  Location: [facility];  Service: Plastics;  Laterality: N/A;   esophogeal dilation     fatty tissue removed     from neck 2003   HERNIA REPAIR  2007   mastoidectomy with tympanoplasty     MELANOMA EXCISION Left 11/17/2020   Procedure: WIDE LOCAL EXCISION, ADVANCED FLAP  CLOSURE LEFT POSTERIOR EAR MELANOMA;  Surgeon: Almond Lint, MD;  Location: [facility];  Service: General;  Laterality: Left;   MELANOMA EXCISION Left 12/01/2020   Procedure: RE-EXCISION LEFT POSTERIOR AURICULAR MELANOMA WITH TEMPORARY CLOSURE;  Surgeon: Almond Lint, MD;  Location: [facility];  Service: General;  Laterality: Left;  60 MIN TOTAL   POLYPECTOMY  2011   +TA   right toe bone spur surgery     SKIN  FULL THICKNESS GRAFT Left 12/08/2020   Procedure: Full thickness skin graft from left upper arm;  Surgeon: Glenna Fellows, MD;  Location: [facility];  Service: Plastics;  Laterality: Left;   THYROIDECTOMY, PARTIAL  07/2002   right thyroid   TONSILLECTOMY     UPPER GI ENDOSCOPY      Allergies  Allergies  Allergen Reactions   Sulfa Antibiotics Other (See Comments)    Severe crippling  joint and muscle pain and redness   Sulfamethoxazole-Trimethoprim Other (See Comments)    Joint and muscle pain   Sulfasalazine Other (See Comments)    Joint and muscle pain   Amlodipine Swelling   Codeine Other (See Comments)    Unknown   Crestor [Rosuvastatin Calcium] Other (See Comments)    Severe joint and muscle pain   Hydrocodone-Acetaminophen Other (See Comments)    To strong vicodin    Other     Apples and tomatoes...... Nausea and migraines.     Rosuvastatin Other (See Comments)    REACTION: REALLY BAD JOINT/MUSCLE PAIN    History of Present Illness    76 year old male with the above past medical history including CAD, hypertension, hyperlipidemia, bilateral carotid artery stenosis, bilateral sensorineural hearing loss, anemia and type 2 diabetes.  Has a history of prior DES-LAD in May 2003. Cardiac catheterization in 2010 showed 30-40% LAD stenosis just distal to the stent, no other obstructive disease, EF 50%.  He has been managed medically. Exercise Myoview in March 2020 showed EF 56%, significant ST changes but normal perfusion. Echocardiogram in May 2020 showed EF 60 to 65%, impaired relaxation, mildly dilated left atrium.  Cardiac MRI in October 2020 showed basal anteroseptal enhancement which could be seen with prior myocarditis but also with sarcoid, not consistent with amyloid, EF 61%.  He was last seen in the office on April 30, 2021 and was stable overall from a cardiac standpoint.  He did report mild low bilateral lower extremity edema which was thought to be  related to anemia with high output, improved with Lasix.  In the ED on 11/17/2021 with complaints of headache, blurry vision, slurred speech.  SBP was elevated > 200.  CT of the head was negative for acute process.  He was admitted to the ICU and started on IV Cardene. He was hospitalized from 11/17/2021 to 11/19/2021 in the setting of hypertensive emergency. Carotid dopplers showed 1-39% BICA stenosis. He was discharged home in stable condition on 11/19/2021 with the addition of amlodipine to his BP medication regimen. He called our office on 11/19/2021 with concerns for amlodipine prescription as he had significant lower extremity edema with this medication in the past. As a result, amlodipine was switched to hydralazine.  He presents today for follow-up accompanied by his wife.   Since his hospitalization been stable from a cardiac standpoint. BP has been somewhat labile with home readings ranging from SBP 120s-170s.  He has not been checking his BP at consistent times of day, sometimes before taking his medications.  He denies any headaches, vision  changes, chest pain, dyspnea. Overall, he reports feeling well denies any specific concerns or complaints though he is hopeful to achieve better BP control.  Home Medications    Current Outpatient Medications  Medication Sig Dispense Refill   aspirin 81 MG tablet Take 81 mg by mouth daily.     azelastine (ASTELIN) 0.1 % nasal spray Place 1 spray into both nostrils daily as needed for rhinitis. Use in each nostril as directed     Biotin 1000 MCG tablet Take 1,000 mcg by mouth 2 (two) times daily.     Boswellia Serrata (BOSWELLIA PO) Take 500 mg by mouth in the morning and at bedtime.     celecoxib (CELEBREX) 200 MG capsule Take 1 capsule (200 mg total) by mouth daily. 90 capsule 3   cholecalciferol (VITAMIN D) 25 MCG (1000 UNIT) tablet Take 3,000 Units by mouth 3 (three) times daily.     Cinnamon 500 MG capsule Take 1,000 mg by mouth 2 (two) times daily.      Coenzyme Q10 (COQ-10) 100 MG capsule Take 1 capsule (100 mg total) by mouth daily.     Cyanocobalamin (VITAMIN B-12) 1000 MCG SUBL Take 1,000 mcg by mouth daily.     Diclofenac Sodium 3 % GEL Qid prn (Patient taking differently: Apply 1 application. topically daily as needed (Arthritis).) 100 g 3   ezetimibe (ZETIA) 10 MG tablet Take 1 tablet (10 mg total) by mouth daily. 90 tablet 3   Ginger, Zingiber officinalis, (GINGER ROOT) 550 MG CAPS Take 550 mg by mouth 2 (two) times daily.     hydrALAZINE (APRESOLINE) 50 MG tablet Take 1 tablet (50 mg total) by mouth 3 (three) times daily. 270 tablet 3   levothyroxine (SYNTHROID) 112 MCG tablet Take 1 tablet (112 mcg total) by mouth daily before breakfast. 90 tablet 1   losartan (COZAAR) 100 MG tablet TAKE ONE TABLET BY MOUTH DAILY 90 tablet 1   Multiple Vitamins-Minerals (THERAGRAN-M ADVANCED 50 PLUS PO) Take 1 tablet by mouth daily.     Nerve Stimulator (CEFALY KIT) DEVI Apply 1 application topically daily at 12 noon. Electrical migraine kit for prevention on treatment of migraine headache     OVER THE COUNTER MEDICATION Take 1 capsule by mouth 2 (two) times daily. Migra-eeze gel cap     pantoprazole (PROTONIX) 40 MG tablet TAKE ONE TABLET BY MOUTH DAILY 90 tablet 1   propranolol ER (INDERAL LA) 160 MG SR capsule Take 1 capsule (160 mg total) by mouth daily. One tablet po qd 90 capsule 1   simvastatin (ZOCOR) 40 MG tablet Take 1 tablet (40 mg total) by mouth at bedtime. 90 tablet 3   tamsulosin (FLOMAX) 0.4 MG CAPS capsule Take 0.4 mg by mouth in the morning and at bedtime. 30 capsule    topiramate (TOPAMAX) 50 MG tablet TAKE ONE TABLET BY MOUTH EVERY NIGHT AT BEDTIME 90 tablet 0   triamcinolone (KENALOG) 0.1 % Apply 1 application topically daily.     No current facility-administered medications for this visit.     Review of Systems    He denies chest pain, palpitations, dyspnea, pnd, orthopnea, n, v, dizziness, syncope, edema, weight gain, or early  satiety. All other systems reviewed and are otherwise negative except as noted above.   Physical Exam    VS:  BP 138/74 (BP Location: Left Arm, Patient Position: Sitting, Cuff Size: Normal)   Pulse 60   Resp 20   Ht 5\' 9"  (1.753 m)   Wt 160  lb (72.6 kg)   SpO2 (!) 85%   BMI 23.63 kg/m   GEN: Well nourished, well developed, in no acute distress. HEENT: normal. Neck: Supple, no JVD, carotid bruits, or masses. Cardiac: RRR, no murmurs, rubs, or gallops. No clubbing, cyanosis, edema.  Radials/DP/PT 2+ and equal bilaterally.  Respiratory:  Respirations regular and unlabored, clear to auscultation bilaterally. GI: Soft, nontender, nondistended, BS + x 4. MS: no deformity or atrophy. Skin: warm and dry, no rash. Neuro:  Strength and sensation are intact. Psych: Normal affect.  Accessory Clinical Findings    ECG personally reviewed by me today - NSR, 60 bpm - no acute changes.  Lab Results  Component Value Date   WBC 5.2 11/23/2021   HGB 16.5 11/23/2021   HCT 48.6 11/23/2021   MCV 95.6 11/23/2021   PLT 130.0 (L) 11/23/2021   Lab Results  Component Value Date   CREATININE 1.36 11/23/2021   BUN 28 (H) 11/23/2021   NA 140 11/23/2021   K 4.2 11/23/2021   CL 104 11/23/2021   CO2 29 11/23/2021   Lab Results  Component Value Date   ALT 20 11/23/2021   AST 20 11/23/2021   ALKPHOS 70 11/23/2021   BILITOT 0.7 11/23/2021   Lab Results  Component Value Date   CHOL 138 11/23/2021   HDL 61.90 11/23/2021   LDLCALC 65 11/23/2021   TRIG 53.0 11/23/2021   CHOLHDL 2 11/23/2021    Lab Results  Component Value Date   HGBA1C 4.9 11/18/2021    Assessment & Plan    1. Hypertension: BP remains above goal. Will increase hydralazine to 50 mg 3 times daily.  Monitor home BP (currently 2 hours after medication, following 5-10 minutes of seated rest). He will report BP consistently > 130/80.  If BP remains elevated, consider transitioning losartan to valsartan.  For now, continue  losartan.  2. CAD: Cath in 2010 showed 30-40% LAD stenosis just distal to the stent, no other obstructive disease, EF 50%, managed medically. Stable with no anginal symptoms. No indication for ischemic evaluation. Continue aspirin, hydralazine, losartan, simvastatin, and Zetia.  3. Carotid artery stenosis: Carotid dopplers showed 1-39% BICA stenosis.  Asymptomatic. Continue aspirin, statin as above.   4. Bilateral lower extremity edema: Resolved.   5. H/o migraine: He thinks that his visual symptoms that prompted his ED visit were in the setting of migraine. Continue propanolol for prophylaxis.   6. Hyperlipidemia: LDL was 65 in 11/2021.  Continue aspirin, simvastatin, Zetia as above.  7. Disposition: F/u in 1 month.   Joylene Grapes, NP 12/22/2021, 12:41 PM

## 2021-12-22 ENCOUNTER — Other Ambulatory Visit: Payer: Self-pay

## 2021-12-22 ENCOUNTER — Encounter: Payer: Self-pay | Admitting: Nurse Practitioner

## 2021-12-22 ENCOUNTER — Ambulatory Visit (INDEPENDENT_AMBULATORY_CARE_PROVIDER_SITE_OTHER): Payer: Medicare Other | Admitting: Nurse Practitioner

## 2021-12-22 VITALS — BP 138/74 | HR 60 | Resp 20 | Ht 69.0 in | Wt 160.0 lb

## 2021-12-22 DIAGNOSIS — I6523 Occlusion and stenosis of bilateral carotid arteries: Secondary | ICD-10-CM

## 2021-12-22 DIAGNOSIS — E785 Hyperlipidemia, unspecified: Secondary | ICD-10-CM

## 2021-12-22 DIAGNOSIS — Z8669 Personal history of other diseases of the nervous system and sense organs: Secondary | ICD-10-CM

## 2021-12-22 DIAGNOSIS — R600 Localized edema: Secondary | ICD-10-CM

## 2021-12-22 DIAGNOSIS — I10 Essential (primary) hypertension: Secondary | ICD-10-CM

## 2021-12-22 DIAGNOSIS — I2510 Atherosclerotic heart disease of native coronary artery without angina pectoris: Secondary | ICD-10-CM | POA: Diagnosis not present

## 2021-12-22 MED ORDER — HYDRALAZINE HCL 50 MG PO TABS: 50.0000 mg | ORAL_TABLET | Freq: Three times a day (TID) | ORAL | 3 refills | Status: DC

## 2021-12-22 NOTE — Patient Instructions (Signed)
Medication Instructions:  ?Increase Hydralazine 50 mg three times daily  ?*If you need a refill on your cardiac medications before your next appointment, please call your pharmacy* ? ? ?Lab Work: ?NONE ordered at this time of appointment  ? ? ?Testing/Procedures: ?NONE ordered at this time of appointment  ? ? ? ?Follow-Up: ?At [facility], you and your health needs are our priority.  As part of our continuing mission to provide you with exceptional heart care, we have created designated Provider Care Teams.  These Care Teams include your primary Cardiologist (physician) and Advanced Practice Providers (APPs -  Physician Assistants and Nurse Practitioners) who all work together to provide you with the care you need, when you need it. ? ?We recommend signing up for the patient portal called "MyChart".  Sign up information is provided on this After Visit Summary.  MyChart is used to connect with patients for Virtual Visits (Telemedicine).  Patients are able to view lab/test results, encounter notes, upcoming appointments, etc.  Non-urgent messages can be sent to your provider as well.   ?To learn more about what you can do with MyChart, go to [email or web address].   ? ?Your next appointment:   ?1 month(s) ? ?The format for your next appointment:   ?In Person ? ?Provider:   ?Diona Browner, NP      ? ? ?Other Instructions ?Monitor blood pressure. Take blood pressure 2 hours after morning medications. Report blood pressure if consistently greater than 130/80  ? ?

## 2021-12-23 ENCOUNTER — Other Ambulatory Visit: Payer: Self-pay

## 2021-12-23 LAB — URINE CULTURE
MICRO NUMBER:: 13122120
SPECIMEN QUALITY:: ADEQUATE

## 2021-12-23 MED ORDER — NITROFURANTOIN MONOHYD MACRO 100 MG PO CAPS: 100.0000 mg | ORAL_CAPSULE | Freq: Two times a day (BID) | ORAL | 0 refills | Status: DC

## 2021-12-28 ENCOUNTER — Encounter: Payer: Self-pay | Admitting: Family Medicine

## 2021-12-29 ENCOUNTER — Ambulatory Visit (INDEPENDENT_AMBULATORY_CARE_PROVIDER_SITE_OTHER): Payer: Medicare Other | Admitting: Family Medicine

## 2021-12-29 ENCOUNTER — Encounter: Payer: Self-pay | Admitting: Family Medicine

## 2021-12-29 VITALS — BP 130/70 | HR 67 | Temp 97.8°F | Resp 16 | Ht 69.0 in | Wt 158.4 lb

## 2021-12-29 DIAGNOSIS — N3000 Acute cystitis without hematuria: Secondary | ICD-10-CM | POA: Diagnosis not present

## 2021-12-29 LAB — POCT URINALYSIS DIPSTICK
Bilirubin, UA: NEGATIVE
Blood, UA: NEGATIVE
Glucose, UA: NEGATIVE
Ketones, UA: NEGATIVE
Leukocytes, UA: NEGATIVE
Nitrite, UA: NEGATIVE
Protein, UA: NEGATIVE
Spec Grav, UA: 1.01 (ref 1.010–1.025)
Urobilinogen, UA: 0.2 E.U./dL
pH, UA: 7 (ref 5.0–8.0)

## 2021-12-29 MED ORDER — TETRACYCLINE HCL 500 MG PO CAPS: 500.0000 mg | ORAL_CAPSULE | Freq: Two times a day (BID) | ORAL | 0 refills | Status: AC

## 2021-12-29 NOTE — Telephone Encounter (Signed)
Patient made appointment for today with Dr. Nani Ravens at 1:15pm. ?

## 2021-12-29 NOTE — Progress Notes (Signed)
Chief Complaint  ?Patient presents with  ? Urinary Tract Infection  ?  Here for UTI   ? ? ?[name] is a 76 y.o. male here for possible UTI. ? ?Duration: 1 year. Never fully got better. Saw urology but has difficulty with f/u with them. This current issue seemed to flare around 2-3 weeks ago.  ?Symptoms: Sees sediment in urine, urinary frequency, urinary retention, urgency ?Denies: hematuria, fever, nausea, vomiting, hesitancy, discharge ?Hx of recurrent UTI? Yes ?Most recent culture shows staph w sensitivity to Macrobid (didn't help his s/s's but made him ill), FQ's, PCN.  ?Tx w Macrobid but felt terrible on it so only took 6 pills over 4 d instead of bid for 7 d as written.  ?He has an allergy to sulfa.  ?He is not circumcised.  ?Denies new sexual partners. ? ?Past Medical History:  ?Diagnosis Date  ? Abdominal bruit 07/22/2010  ? Acute sinusitis 03/26/2015  ? Acute suppr otitis media w spon rupt ear drum, recur, r ear 04/06/2017  ? Angina pectoris 12/11/2008  ? Benign essential tremor 08/29/2017  ? Bilateral sensorineural hearing loss 11/30/2015  ? CAD S/P percutaneous coronary angioplasty 12/11/2008  ? LAD PCI '03,  Cath 2010 showed 40% LAD- medical Rx Myoview March 2020 negative for ischemia but positive for ST depression- Imdur added.  Formatting of this note might be different from the original. LAD PCI '03,  Cath 2010 showed 40% LAD- medical Rx Myoview March 2020 negative for ischemia but positive for ST depression- Imdur added.  Last Assessment & Plan:  Check labs con't meds Followed by car  ? Cataract   ? Chronic mastoiditis 02/11/2020  ? Chronic pansinusitis 09/21/2016  ? Chronic right shoulder pain 10/30/2019  ? Chronic serous otitis media 04/19/2018  ? Chronic tubotympanic suppurative otitis media, bilateral   ? 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 Drug-induced neutropenia 11/10/2020  ? Dysphagia 10/18/2018  ? Essential hypertension 12/11/2008  ? Echo May 2020- EF 60-65% with mild LVH   Formatting of  this note might be different from the original. Echo May 2020- EF 60-65% with mild LVH  Last Assessment & Plan:  Well controlled, no changes to meds. Encouraged heart healthy diet such as the DASH diet and exercise as tolerated.  ? Eustachian tube dysfunction, bilateral 11/30/2015  ? History of colonic polyps 07/12/2010  ? History of GI bleed 04/23/2020  ? History of left mastoidectomy 02/20/2020  ? History of placement of ear tubes   ? Hyperlipidemia associated with type 2 diabetes mellitus 07/10/2007  ? Hyperthyroidism 07/12/2010  ? Hypothyroidism 07/10/2007  ? Internal hemorrhoids   ? Intractable episodic cluster headache 08/29/2017  ? Major depressive disorder 07/10/2007  ? Mastoiditis of both sides 09/21/2016  ? Migraine headache   ? Mild neurocognitive disorder due to multiple etiologies 12/25/2020  ? Non-insulin dependent type 2 diabetes mellitus 07/06/2010  ? Osteoarthritis of multiple joints 07/10/2007  ? Perennial allergic rhinitis 09/22/2016  ? Postprocedural urinary retention   ? Presbycusis of both ears 01/23/2018  ? Skin cancer, basal cell   ? Thrombocytopenia   ? TIA (transient ischemic attack) 2006  ? per patient's report. He was never officially given this diagnosis  ? Upper airway cough syndrome 09/25/2017  ?  ? ?BP 130/70 (BP Location: Right Arm, Patient Position: Sitting, Cuff Size: Normal)   Pulse 67   Temp 97.8 ?F (36.6 ?C) (Oral)   Resp 16   Ht '5\' 9"'$  (1.753 m)   Wt 158 lb 6.4  oz (71.8 kg)   SpO2 94%   BMI 23.39 kg/m?  ?General: Awake, alert, appears stated age ?Heart: RRR ?Lungs: CTAB, normal respiratory effort, no accessory muscle usage ?Abd: BS+, soft, NT, ND, no masses or organomegaly ?MSK: No CVA tenderness, neg Lloyd's sign ?Psych: Age appropriate judgment and insight ? ?Acute cystitis without hematuria - Plan: POCT Urinalysis Dipstick, Urine Culture, tetracycline (SUMYCIN) 500 MG capsule ? ?Exacerbation of now chronic issue. Culture shows sensitivity to Macrobid, carbapenems and  tetracycline. Won't do Vanc. Tetracycline 500 mg bid for 7 d. If no improvement, will refer to ID.  ?Seek immediate care if pt starts to develop fevers, new/worsening symptoms, uncontrollable N/V. ?F/u prn. ?The patient voiced understanding and agreement to the plan. ? ?Shelda Pal, DO ?12/29/21 ?1:44 PM ? ?

## 2021-12-29 NOTE — Patient Instructions (Signed)
Stay hydrated.   Warning signs/symptoms: Uncontrollable nausea/vomiting, fevers, worsening symptoms despite treatment, confusion.  Give us around 2 business days to get culture back to you.  Let us know if you need anything. 

## 2021-12-30 LAB — URINE CULTURE
MICRO NUMBER:: 13164688
Result:: NO GROWTH
SPECIMEN QUALITY:: ADEQUATE

## 2022-01-03 DIAGNOSIS — S335XXD Sprain of ligaments of lumbar spine, subsequent encounter: Secondary | ICD-10-CM | POA: Diagnosis not present

## 2022-01-03 DIAGNOSIS — M47816 Spondylosis without myelopathy or radiculopathy, lumbar region: Secondary | ICD-10-CM | POA: Diagnosis not present

## 2022-01-06 DIAGNOSIS — D631 Anemia in chronic kidney disease: Secondary | ICD-10-CM | POA: Diagnosis not present

## 2022-01-06 DIAGNOSIS — N2581 Secondary hyperparathyroidism of renal origin: Secondary | ICD-10-CM | POA: Diagnosis not present

## 2022-01-06 DIAGNOSIS — N1831 Chronic kidney disease, stage 3a: Secondary | ICD-10-CM | POA: Diagnosis not present

## 2022-01-06 DIAGNOSIS — I129 Hypertensive chronic kidney disease with stage 1 through stage 4 chronic kidney disease, or unspecified chronic kidney disease: Secondary | ICD-10-CM | POA: Diagnosis not present

## 2022-01-10 ENCOUNTER — Other Ambulatory Visit: Payer: Self-pay | Admitting: Family Medicine

## 2022-01-10 ENCOUNTER — Other Ambulatory Visit: Payer: Self-pay | Admitting: Nephrology

## 2022-01-10 DIAGNOSIS — N1831 Chronic kidney disease, stage 3a: Secondary | ICD-10-CM

## 2022-01-10 DIAGNOSIS — M159 Polyosteoarthritis, unspecified: Secondary | ICD-10-CM

## 2022-01-10 DIAGNOSIS — I129 Hypertensive chronic kidney disease with stage 1 through stage 4 chronic kidney disease, or unspecified chronic kidney disease: Secondary | ICD-10-CM

## 2022-01-10 DIAGNOSIS — E785 Hyperlipidemia, unspecified: Secondary | ICD-10-CM

## 2022-01-12 ENCOUNTER — Ambulatory Visit
Admission: RE | Admit: 2022-01-12 | Discharge: 2022-01-12 | Disposition: A | Discharge status: Home / Self Care | Payer: Medicare Other | Source: Ambulatory Visit | Attending: Nephrology | Admitting: Nephrology

## 2022-01-12 DIAGNOSIS — N189 Chronic kidney disease, unspecified: Secondary | ICD-10-CM | POA: Diagnosis not present

## 2022-01-12 DIAGNOSIS — I129 Hypertensive chronic kidney disease with stage 1 through stage 4 chronic kidney disease, or unspecified chronic kidney disease: Secondary | ICD-10-CM

## 2022-01-12 DIAGNOSIS — N281 Cyst of kidney, acquired: Secondary | ICD-10-CM | POA: Diagnosis not present

## 2022-01-12 DIAGNOSIS — N1831 Chronic kidney disease, stage 3a: Secondary | ICD-10-CM

## 2022-01-12 DIAGNOSIS — N210 Calculus in bladder: Secondary | ICD-10-CM | POA: Diagnosis not present

## 2022-01-24 DIAGNOSIS — M25552 Pain in left hip: Secondary | ICD-10-CM | POA: Diagnosis not present

## 2022-01-24 DIAGNOSIS — M47816 Spondylosis without myelopathy or radiculopathy, lumbar region: Secondary | ICD-10-CM | POA: Diagnosis not present

## 2022-01-28 DIAGNOSIS — R972 Elevated prostate specific antigen [PSA]: Secondary | ICD-10-CM | POA: Diagnosis not present

## 2022-02-02 NOTE — Progress Notes (Addendum)
? ? ?Office Visit  ?  ?Patient Name: [name] ?Date of Encounter: 02/03/2022 ? ?Primary Care Provider:  Carollee Herter, Alferd Apa, DO ?Primary Cardiologist:  Kirk Ruths, MD ? ?Chief Complaint  ?  ?76 year old male with a history of CAD, hypertension, hyperlipidemia, bilateral carotid artery stenosis, bilateral sensorineural hearing loss, anemia and type 2 diabetes who presents for follow-up related to CAD and hypertension.   ? ?Past Medical History  ?  ?Past Medical History:  ?Diagnosis Date  ? Abdominal bruit 07/22/2010  ? Acute sinusitis 03/26/2015  ? Acute suppr otitis media w spon rupt ear drum, recur, r ear 04/06/2017  ? Angina pectoris 12/11/2008  ? Benign essential tremor 08/29/2017  ? Bilateral sensorineural hearing loss 11/30/2015  ? CAD S/P percutaneous coronary angioplasty 12/11/2008  ? LAD PCI '03,  Cath 2010 showed 40% LAD- medical Rx Myoview March 2020 negative for ischemia but positive for ST depression- Imdur added.  Formatting of this note might be different from the original. LAD PCI '03,  Cath 2010 showed 40% LAD- medical Rx Myoview March 2020 negative for ischemia but positive for ST depression- Imdur added.  Last Assessment & Plan:  Check labs con't meds Followed by car  ? Cataract   ? Chronic mastoiditis 02/11/2020  ? Chronic pansinusitis 09/21/2016  ? Chronic right shoulder pain 10/30/2019  ? Chronic serous otitis media 04/19/2018  ? Chronic tubotympanic suppurative otitis media, bilateral   ? 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 Drug-induced neutropenia 11/10/2020  ? Dysphagia 10/18/2018  ? Essential hypertension 12/11/2008  ? Echo May 2020- EF 60-65% with mild LVH   Formatting of this note might be different from the original. Echo May 2020- EF 60-65% with mild LVH  Last Assessment & Plan:  Well controlled, no changes to meds. Encouraged heart healthy diet such as the DASH diet and exercise as tolerated.  ? Eustachian tube dysfunction, bilateral 11/30/2015  ? History of colonic polyps  07/12/2010  ? History of GI bleed 04/23/2020  ? History of left mastoidectomy 02/20/2020  ? History of placement of ear tubes   ? Hyperlipidemia associated with type 2 diabetes mellitus 07/10/2007  ? Hyperthyroidism 07/12/2010  ? Hypothyroidism 07/10/2007  ? Internal hemorrhoids   ? Intractable episodic cluster headache 08/29/2017  ? Major depressive disorder 07/10/2007  ? Mastoiditis of both sides 09/21/2016  ? Migraine headache   ? Mild neurocognitive disorder due to multiple etiologies 12/25/2020  ? Non-insulin dependent type 2 diabetes mellitus 07/06/2010  ? Osteoarthritis of multiple joints 07/10/2007  ? Perennial allergic rhinitis 09/22/2016  ? Postprocedural urinary retention   ? Presbycusis of both ears 01/23/2018  ? Skin cancer, basal cell   ? Thrombocytopenia   ? TIA (transient ischemic attack) 2006  ? per patient's report. He was never officially given this diagnosis  ? Upper airway cough syndrome 09/25/2017  ? ?Past Surgical History:  ?Procedure Laterality Date  ? COLONOSCOPY  2011  ? coronary artery disease status post placement    ? of drug-eluting stent in the LAD in 2003,eEF 65% then  ? DEBRIDEMENT AND CLOSURE WOUND N/A 12/08/2020  ? Procedure: CLOSURE LEFT POST AURICULAR SCALP WOUND;  Surgeon: Irene Limbo, MD;  Location: [facility];  Service: Plastics;  Laterality: N/A;  ? esophogeal dilation    ? fatty tissue removed    ? from neck 2003  ? HERNIA REPAIR  2007  ? mastoidectomy with tympanoplasty    ? MELANOMA EXCISION Left 11/17/2020  ? Procedure: WIDE LOCAL  EXCISION, ADVANCED FLAP CLOSURE LEFT POSTERIOR EAR MELANOMA;  Surgeon: Stark Klein, MD;  Location: [facility];  Service: General;  Laterality: Left;  ? MELANOMA EXCISION Left 12/01/2020  ? Procedure: RE-EXCISION LEFT POSTERIOR AURICULAR MELANOMA WITH TEMPORARY CLOSURE;  Surgeon: Stark Klein, MD;  Location: [facility];  Service: General;  Laterality: Left;  60 MIN TOTAL  ? POLYPECTOMY  2011  ? +TA  ? right toe bone  spur surgery    ? SKIN FULL THICKNESS GRAFT Left 12/08/2020  ? Procedure: Full thickness skin graft from left upper arm;  Surgeon: Irene Limbo, MD;  Location: [facility];  Service: Plastics;  Laterality: Left;  ? THYROIDECTOMY, PARTIAL  07/2002  ? right thyroid  ? TONSILLECTOMY    ? UPPER GI ENDOSCOPY    ? ? ?Allergies ? ?Allergies  ?Allergen Reactions  ? Sulfa Antibiotics Other (See Comments)  ?  Severe crippling  joint and muscle pain and redness  ? Sulfamethoxazole-Trimethoprim Other (See Comments)  ?  Joint and muscle pain  ? Sulfasalazine Other (See Comments)  ?  Joint and muscle pain  ? Amlodipine Swelling  ? Codeine Other (See Comments)  ?  Unknown  ? Crestor [Rosuvastatin Calcium] Other (See Comments)  ?  Severe joint and muscle pain  ? Hydrocodone-Acetaminophen Other (See Comments)  ?  To strong ?vicodin ?  ? Nitrofurantoin Swelling  ?  Ankles/legs ?SOB  ? Other   ?  Apples and tomatoes...... Nausea and migraines.  ?  ? Rosuvastatin Other (See Comments)  ?  REACTION: REALLY BAD JOINT/MUSCLE PAIN  ? ? ?History of Present Illness  ?  ?76 year old male with the above past medical history including CAD, hypertension, hyperlipidemia, bilateral carotid artery stenosis, bilateral sensorineural hearing loss, anemia and type 2 diabetes. ?  ?Has a history of prior DES-LAD in May 2003. Cardiac catheterization in 2010 showed 30-40% LAD stenosis just distal to the stent, no other obstructive disease, EF 50%.  He has been managed medically. Exercise Myoview in March 2020 showed EF 56%, significant ST changes but normal perfusion. Echocardiogram in May 2020 showed EF 60 to 65%, impaired relaxation, mildly dilated left atrium.  Cardiac MRI in October 2020 showed basal anteroseptal enhancement which could be seen with prior myocarditis but also with sarcoid, not consistent with amyloid, EF 61%.  He was last seen in the office on April 30, 2021 and was stable overall from a cardiac standpoint.  He did  report mild low bilateral lower extremity edema which was thought to be related to anemia with high output, improved with Lasix.  In the ED on 11/17/2021 with complaints of headache, blurry vision, slurred speech.  SBP was elevated > 200.  CT of the head was negative for acute process.  He was admitted to the ICU and started on IV Cardene. He was hospitalized from 11/17/2021 to 11/19/2021 in the setting of hypertensive emergency. Carotid dopplers showed 1-39% BICA stenosis. He was discharged home in stable condition on 11/19/2021 with the addition of amlodipine to his BP medication regimen. He called our office on 11/19/2021 with concerns for amlodipine prescription as he had significant lower extremity edema with this medication in the past. As a result, amlodipine was switched to hydralazine. ?  ?He was last seen in the office on 12/22/2021 and reported labile BP, with home SBP readings in the 180s. His hydralazine was increased to 50 mg 3 times daily. He presents today for follow-up, accompanied by his wife.  Since his  last visit he has done well from a cardiac standpoint.  His BP has improved overall, though he does still have elevated SBP in the 150s most days.  He states when he started taking his hydralazine 3 times a day, he noted some mild lightheadedness with position changes, however, this has since resolved. He states he saw nephrology and had a renal ultrasound that was essentially normal.  His Celebrex was discontinued.  Overall, he reports feeling well and denies any new concerns today. ? ?Home Medications  ?  ?Current Outpatient Medications  ?Medication Sig Dispense Refill  ? aspirin 81 MG tablet Take 81 mg by mouth daily.    ? azelastine (ASTELIN) 0.1 % nasal spray Place 1 spray into both nostrils daily as needed for rhinitis. Use in each nostril as directed    ? Biotin 1000 MCG tablet Take 1,000 mcg by mouth 2 (two) times daily.    ? Boswellia Serrata (BOSWELLIA PO) Take 500 mg by mouth in the morning and  at bedtime.    ? celecoxib (CELEBREX) 200 MG capsule TAKE ONE CAPSULE BY MOUTH DAILY 90 capsule 3  ? cholecalciferol (VITAMIN D) 25 MCG (1000 UNIT) tablet Take 3,000 Units by mouth 3 (three) times daily.    ? Cinn

## 2022-02-03 ENCOUNTER — Other Ambulatory Visit: Payer: Self-pay

## 2022-02-03 ENCOUNTER — Encounter: Payer: Self-pay | Admitting: Nurse Practitioner

## 2022-02-03 ENCOUNTER — Ambulatory Visit (INDEPENDENT_AMBULATORY_CARE_PROVIDER_SITE_OTHER): Payer: Medicare Other | Admitting: Nurse Practitioner

## 2022-02-03 VITALS — BP 148/80 | HR 68 | Resp 20 | Ht 69.0 in | Wt 157.8 lb

## 2022-02-03 DIAGNOSIS — E785 Hyperlipidemia, unspecified: Secondary | ICD-10-CM

## 2022-02-03 DIAGNOSIS — I2510 Atherosclerotic heart disease of native coronary artery without angina pectoris: Secondary | ICD-10-CM | POA: Diagnosis not present

## 2022-02-03 DIAGNOSIS — I10 Essential (primary) hypertension: Secondary | ICD-10-CM

## 2022-02-03 DIAGNOSIS — Z8669 Personal history of other diseases of the nervous system and sense organs: Secondary | ICD-10-CM

## 2022-02-03 DIAGNOSIS — R600 Localized edema: Secondary | ICD-10-CM

## 2022-02-03 DIAGNOSIS — I6523 Occlusion and stenosis of bilateral carotid arteries: Secondary | ICD-10-CM

## 2022-02-03 DIAGNOSIS — Z79899 Other long term (current) drug therapy: Secondary | ICD-10-CM

## 2022-02-03 DIAGNOSIS — N1831 Chronic kidney disease, stage 3a: Secondary | ICD-10-CM

## 2022-02-03 MED ORDER — VALSARTAN 80 MG PO TABS: 80.0000 mg | ORAL_TABLET | Freq: Every day | ORAL | 11 refills | Status: DC

## 2022-02-03 NOTE — Patient Instructions (Addendum)
Medication Instructions:  ?Stop Losartan as directed  ?Valsartan 80 mg daily ? ?*If you need a refill on your cardiac medications before your next appointment, please call your pharmacy* ? ? ?Lab Work: ?Your physician recommends that you return for lab work in 2 weeks ?BMET ? ?If you have labs (blood work) drawn today and your tests are completely normal, you will receive your results only by: ?MyChart Message (if you have MyChart) OR ?A paper copy in the mail ?If you have any lab test that is abnormal or we need to change your treatment, we will call you to review the results. ? ? ?Testing/Procedures: ?NONE ordered at this time of appointment  ? ? ? ?Follow-Up: ?At [facility], you and your health needs are our priority.  As part of our continuing mission to provide you with exceptional heart care, we have created designated Provider Care Teams.  These Care Teams include your primary Cardiologist (physician) and Advanced Practice Providers (APPs -  Physician Assistants and Nurse Practitioners) who all work together to provide you with the care you need, when you need it. ? ?We recommend signing up for the patient portal called "MyChart".  Sign up information is provided on this After Visit Summary.  MyChart is used to connect with patients for Virtual Visits (Telemedicine).  Patients are able to view lab/test results, encounter notes, upcoming appointments, etc.  Non-urgent messages can be sent to your provider as well.   ?To learn more about what you can do with MyChart, go to [email or web address].   ? ?Your next appointment:   ?1 month(s) ? ?The format for your next appointment:   ?In Person ? ?Provider:   ?Diona Browner, NP      ? ? ?Other Instructions ? ? ?Important Information About Sugar ? ? ? ? ? ? ?

## 2022-02-04 DIAGNOSIS — R972 Elevated prostate specific antigen [PSA]: Secondary | ICD-10-CM | POA: Diagnosis not present

## 2022-02-04 DIAGNOSIS — R3912 Poor urinary stream: Secondary | ICD-10-CM | POA: Diagnosis not present

## 2022-02-04 DIAGNOSIS — N3000 Acute cystitis without hematuria: Secondary | ICD-10-CM | POA: Diagnosis not present

## 2022-02-04 DIAGNOSIS — N401 Enlarged prostate with lower urinary tract symptoms: Secondary | ICD-10-CM | POA: Diagnosis not present

## 2022-02-05 ENCOUNTER — Other Ambulatory Visit: Payer: Self-pay | Admitting: Neurology

## 2022-02-10 ENCOUNTER — Encounter: Payer: Self-pay | Admitting: Family Medicine

## 2022-02-10 MED ORDER — PANTOPRAZOLE SODIUM 40 MG PO TBEC: 40.0000 mg | DELAYED_RELEASE_TABLET | Freq: Every day | ORAL | 1 refills | Status: DC

## 2022-02-17 DIAGNOSIS — Z8669 Personal history of other diseases of the nervous system and sense organs: Secondary | ICD-10-CM | POA: Diagnosis not present

## 2022-02-17 DIAGNOSIS — I6523 Occlusion and stenosis of bilateral carotid arteries: Secondary | ICD-10-CM | POA: Diagnosis not present

## 2022-02-17 DIAGNOSIS — E785 Hyperlipidemia, unspecified: Secondary | ICD-10-CM | POA: Diagnosis not present

## 2022-02-17 DIAGNOSIS — R600 Localized edema: Secondary | ICD-10-CM | POA: Diagnosis not present

## 2022-02-17 DIAGNOSIS — I2510 Atherosclerotic heart disease of native coronary artery without angina pectoris: Secondary | ICD-10-CM | POA: Diagnosis not present

## 2022-02-17 DIAGNOSIS — I10 Essential (primary) hypertension: Secondary | ICD-10-CM | POA: Diagnosis not present

## 2022-02-17 DIAGNOSIS — Z79899 Other long term (current) drug therapy: Secondary | ICD-10-CM | POA: Diagnosis not present

## 2022-02-17 LAB — BASIC METABOLIC PANEL
BUN/Creatinine Ratio: 20 (ref 10–24)
BUN: 33 mg/dL — ABNORMAL HIGH (ref 8–27)
CO2: 23 mmol/L (ref 20–29)
Calcium: 9.2 mg/dL (ref 8.6–10.2)
Chloride: 104 mmol/L (ref 96–106)
Creatinine, Ser: 1.66 mg/dL — ABNORMAL HIGH (ref 0.76–1.27)
Glucose: 155 mg/dL — ABNORMAL HIGH (ref 70–99)
Potassium: 4.3 mmol/L (ref 3.5–5.2)
Sodium: 142 mmol/L (ref 134–144)
eGFR: 42 mL/min/{1.73_m2} — ABNORMAL LOW (ref >59)

## 2022-02-18 ENCOUNTER — Telehealth: Payer: Self-pay

## 2022-02-18 NOTE — Telephone Encounter (Signed)
Lmom, to discuss lab results. Waiting on a return call.  

## 2022-02-22 NOTE — Telephone Encounter (Signed)
Spoke with pt. Pt was notified of lab results, continue current medications and recommendations to follow up with Nephrology.  ?

## 2022-02-28 ENCOUNTER — Other Ambulatory Visit: Payer: Self-pay | Admitting: Neurology

## 2022-03-07 ENCOUNTER — Encounter: Payer: Self-pay | Admitting: Family Medicine

## 2022-03-08 ENCOUNTER — Other Ambulatory Visit: Payer: Self-pay | Admitting: Family Medicine

## 2022-03-08 DIAGNOSIS — E1165 Type 2 diabetes mellitus with hyperglycemia: Secondary | ICD-10-CM

## 2022-03-08 MED ORDER — METFORMIN HCL 500 MG PO TABS: 500.0000 mg | ORAL_TABLET | Freq: Two times a day (BID) | ORAL | 3 refills | Status: DC

## 2022-03-08 NOTE — Progress Notes (Unsigned)
Office Visit    Patient Name: [name] Date of Encounter: 03/09/2022  Primary Care Provider:  Carollee Herter, Alferd Apa, DO Primary Cardiologist:  Kirk Ruths, MD  Chief Complaint    76 year old male with a history of CAD, hypertension, hyperlipidemia, bilateral carotid artery stenosis, bilateral sensorineural hearing loss, anemia and type 2 diabetes who presents for follow-up related to CAD and hypertension.    Past Medical History    Past Medical History:  Diagnosis Date   Abdominal bruit 07/22/2010   Acute sinusitis 03/26/2015   Acute suppr otitis media w spon rupt ear drum, recur, r ear 04/06/2017   Angina pectoris 12/11/2008   Benign essential tremor 08/29/2017   Bilateral sensorineural hearing loss 11/30/2015   CAD S/P percutaneous coronary angioplasty 12/11/2008   LAD PCI '03,  Cath 2010 showed 40% LAD- medical Rx Myoview March 2020 negative for ischemia but positive for ST depression- Imdur added.  Formatting of this note might be different from the original. LAD PCI '03,  Cath 2010 showed 40% LAD- medical Rx Myoview March 2020 negative for ischemia but positive for ST depression- Imdur added.  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 Echo May 2020- EF 60-65% with mild LVH  Last Assessment & Plan:  Well controlled, no changes to meds. Encouraged heart healthy diet such as the DASH diet and exercise as tolerated.   Eustachian tube dysfunction, bilateral 11/30/2015   History of colonic polyps  07/12/2010   History of GI bleed 04/23/2020   History of left mastoidectomy 02/20/2020   History of placement of ear tubes    Hyperlipidemia associated with type 2 diabetes mellitus 07/10/2007   Hyperthyroidism 07/12/2010   Hypothyroidism 07/10/2007   Internal hemorrhoids    Intractable episodic cluster headache 08/29/2017   Major depressive disorder 07/10/2007   Mastoiditis of both sides 09/21/2016   Migraine headache    Mild neurocognitive disorder due to multiple etiologies 12/25/2020   Non-insulin dependent type 2 diabetes mellitus 07/06/2010   Osteoarthritis of multiple joints 07/10/2007   Perennial allergic rhinitis 09/22/2016   Postprocedural urinary retention    Presbycusis of both ears 01/23/2018   Skin cancer, basal cell    Thrombocytopenia    TIA (transient ischemic attack) 2006   per patient's report. He was never officially given this diagnosis   Upper airway cough syndrome 09/25/2017   Past Surgical History:  Procedure Laterality Date   COLONOSCOPY  2011   coronary artery disease status post placement     of drug-eluting stent in the LAD in 2003,eEF 65% then   [place] N/A 12/08/2020   Procedure: CLOSURE LEFT POST AURICULAR SCALP WOUND;  Surgeon: Irene Limbo, MD;  Location: [facility];  Service: Plastics;  Laterality: N/A;   esophogeal dilation     fatty tissue removed     from neck 2003   HERNIA REPAIR  2007   mastoidectomy with tympanoplasty     MELANOMA EXCISION Left 11/17/2020   Procedure: WIDE LOCAL  EXCISION, ADVANCED FLAP CLOSURE LEFT POSTERIOR EAR MELANOMA;  Surgeon: Stark Klein, MD;  Location: [facility];  Service: General;  Laterality: Left;   MELANOMA EXCISION Left 12/01/2020   Procedure: RE-EXCISION LEFT POSTERIOR AURICULAR MELANOMA WITH TEMPORARY CLOSURE;  Surgeon: Stark Klein, MD;  Location: [facility];  Service: General;  Laterality: Left;  60 MIN TOTAL   POLYPECTOMY  2011   +TA   right toe bone  spur surgery     SKIN FULL THICKNESS GRAFT Left 12/08/2020   Procedure: Full thickness skin graft from left upper arm;  Surgeon: Irene Limbo, MD;  Location: [facility];  Service: Plastics;  Laterality: Left;   THYROIDECTOMY, PARTIAL  07/2002   right thyroid   TONSILLECTOMY     UPPER GI ENDOSCOPY      Allergies  Allergies  Allergen Reactions   Sulfa Antibiotics Other (See Comments)    Severe crippling  joint and muscle pain and redness   Sulfamethoxazole-Trimethoprim Other (See Comments)    Joint and muscle pain   Sulfasalazine Other (See Comments)    Joint and muscle pain   Amlodipine Swelling   Codeine Other (See Comments)    Unknown   Crestor [Rosuvastatin Calcium] Other (See Comments)    Severe joint and muscle pain   Hydrocodone-Acetaminophen Other (See Comments)    To strong vicodin    Nitrofurantoin Swelling    Ankles/legs SOB   Other     Apples and tomatoes...... Nausea and migraines.     Rosuvastatin Other (See Comments)    REACTION: REALLY BAD JOINT/MUSCLE PAIN    History of Present Illness    76 year old male with the above past medical history including CAD, hypertension, hyperlipidemia, bilateral carotid artery stenosis, bilateral sensorineural hearing loss, anemia and type 2 diabetes.   Has a history of prior DES-LAD in May 2003. Cardiac catheterization in 2010 showed 30-40% LAD stenosis just distal to the stent, no other obstructive disease, EF 50%.  He has been managed medically. Exercise Myoview in March 2020 showed EF 56%, significant ST changes but normal perfusion. Echocardiogram in May 2020 showed EF 60 to 65%, impaired relaxation, mildly dilated left atrium. Cardiac MRI in October 2020 showed basal anteroseptal enhancement which could be seen with prior myocarditis but also with sarcoid, not consistent with amyloid, EF 61%.  He has had chronic bilateral lower extremity edema thought to be related to anemia with high output, improved  with Lasix.  He was evaluated in the ED on 11/17/2021 with complaints of headache, blurry vision, slurred speech.  SBP was elevated > 200.  CT of the head was negative for acute process.  He was admitted to the ICU and started on IV Cardene. He was hospitalized from 11/17/2021 to 11/19/2021 in the setting of hypertensive emergency. Carotid dopplers showed 1-39% BICA stenosis. He was discharged home in stable condition on 11/19/2021 with the addition of amlodipine to his BP medication regimen. He called our office on 11/19/2021 with concerns for amlodipine prescription as he had significant lower extremity edema with this medication in the past. As a result, amlodipine was switched to hydralazine.   He was last seen in the office on 02/03/2022 and reported ongoing elevated BP. He was transitioned from losartan to valsartan. He presents today for follow-up accompanied by his wife. Since his last visit he has been stable from a cardiac standpoint. He has been checking his BP daily at home, though at inconsistent times, with readings ranging from 110s-170s/70s-90s. He also reports  missing doses of his blood pressure medication at times. He denies symptoms concerning for angina, denies dyspnea, no edema. Overall, he reports feeling well and denies any new concerns today.  Home Medications    Current Outpatient Medications  Medication Sig Dispense Refill   aspirin 81 MG tablet Take 81 mg by mouth daily.     azelastine (ASTELIN) 0.1 % nasal spray Place 1 spray into both nostrils daily as needed for rhinitis. Use in each nostril as directed     Biotin 1000 MCG tablet Take 1,000 mcg by mouth 2 (two) times daily.     Boswellia Serrata (BOSWELLIA PO) Take 500 mg by mouth in the morning and at bedtime.     cholecalciferol (VITAMIN D) 25 MCG (1000 UNIT) tablet Take 3,000 Units by mouth 3 (three) times daily.     Cinnamon 500 MG capsule Take 1,000 mg by mouth 2 (two) times daily.     Coenzyme Q10 (COQ-10) 100 MG capsule  Take 1 capsule (100 mg total) by mouth daily.     Cyanocobalamin (VITAMIN B-12) 1000 MCG SUBL Take 1,000 mcg by mouth daily.     Diclofenac Sodium 3 % GEL Qid prn (Patient taking differently: Apply 1 application. topically daily as needed (Arthritis).) 100 g 3   ezetimibe (ZETIA) 10 MG tablet TAKE ONE TABLET BY MOUTH DAILY 90 tablet 3   Ginger, Zingiber officinalis, (GINGER ROOT) 550 MG CAPS Take 550 mg by mouth 2 (two) times daily.     hydrALAZINE (APRESOLINE) 50 MG tablet Take 1 tablet (50 mg total) by mouth 3 (three) times daily. 270 tablet 3   levothyroxine (SYNTHROID) 112 MCG tablet Take 1 tablet (112 mcg total) by mouth daily before breakfast. 90 tablet 1   metFORMIN (GLUCOPHAGE) 500 MG tablet Take 1 tablet (500 mg total) by mouth 2 (two) times daily with a meal. 180 tablet 3   Multiple Vitamins-Minerals (THERAGRAN-M ADVANCED 50 PLUS PO) Take 1 tablet by mouth daily.     Nerve Stimulator (CEFALY KIT) DEVI Apply 1 application topically daily at 12 noon. Electrical migraine kit for prevention on treatment of migraine headache     pantoprazole (PROTONIX) 40 MG tablet Take 1 tablet (40 mg total) by mouth daily. 90 tablet 1   propranolol ER (INDERAL LA) 160 MG SR capsule TAKE ONE CAPSULE BY MOUTH DAILY 90 capsule 1   simvastatin (ZOCOR) 40 MG tablet Take 1 tablet (40 mg total) by mouth at bedtime. 90 tablet 3   tamsulosin (FLOMAX) 0.4 MG CAPS capsule Take 0.4 mg by mouth in the morning and at bedtime. 30 capsule    topiramate (TOPAMAX) 50 MG tablet TAKE ONE TABLET BY MOUTH EVERY NIGHT AT BEDTIME 60 tablet 0   valsartan (DIOVAN) 80 MG tablet Take 1 tablet (80 mg total) by mouth daily. 30 tablet 11   No current facility-administered medications for this visit.     Review of Systems   He denies chest pain, palpitations, dyspnea, pnd, orthopnea, n, v, dizziness, syncope, edema, weight gain, or early satiety. All other systems reviewed and are otherwise negative except as noted above.   Physical  Exam    VS:  BP 140/72   Pulse (!) 56   Ht '5\' 9"'  (1.753 m)   Wt 157 lb 3.2 oz (71.3 kg)   SpO2 96%   BMI 23.21 kg/m  GEN: Well nourished, well developed, in no acute distress. HEENT: normal. Neck: Supple, no JVD, carotid bruits, or masses. Cardiac: RRR, no murmurs, rubs,  or gallops. No clubbing, cyanosis, edema.  Radials/DP/PT 2+ and equal bilaterally.  Respiratory:  Respirations regular and unlabored, clear to auscultation bilaterally. GI: Soft, nontender, nondistended, BS + x 4. MS: no deformity or atrophy. Skin: warm and dry, no rash. Neuro:  Strength and sensation are intact. Psych: Normal affect.  Accessory Clinical Findings    ECG personally reviewed by me today - No EKG in office today.  Lab Results  Component Value Date   WBC 5.2 11/23/2021   HGB 16.5 11/23/2021   HCT 48.6 11/23/2021   MCV 95.6 11/23/2021   PLT 130.0 (L) 11/23/2021   Lab Results  Component Value Date   CREATININE 1.66 (H) 02/17/2022   BUN 33 (H) 02/17/2022   NA 142 02/17/2022   K 4.3 02/17/2022   CL 104 02/17/2022   CO2 23 02/17/2022   Lab Results  Component Value Date   ALT 20 11/23/2021   AST 20 11/23/2021   ALKPHOS 70 11/23/2021   BILITOT 0.7 11/23/2021   Lab Results  Component Value Date   CHOL 138 11/23/2021   HDL 61.90 11/23/2021   LDLCALC 65 11/23/2021   TRIG 53.0 11/23/2021   CHOLHDL 2 11/23/2021    Lab Results  Component Value Date   HGBA1C 4.9 11/18/2021    Assessment & Plan    1. Hypertension: BP remains slightly above goal in office today.  He has had labile BP readings at home ranging from 110s-170s/70s-90s.  He reports taking his BP at consistent times of the day, additionally, he has missed doses of his medication at times.  Discussed the importance of adherence and consistent BP monitoring.  Advised him to continue to monitor BP and report BP consistently >130/80.  If BP remains elevated above goal, could consider increasing valsartan, with close monitoring of  renal function.  For now, continue valsartan 80 mg daily, continue hydralazine.  2. CAD: Cath in 2010 showed 30-40% LAD stenosis just distal to the stent, no other obstructive disease, EF 50%, managed medically. Stable with no anginal symptoms. No indication for ischemic evaluation. Continue aspirin, hydralazine, valsartan as above, simvastatin, and Zetia.   3. Carotid artery stenosis: Carotid dopplers showed 1-39% BICA stenosis. Asymptomatic. Continue aspirin, statin as above.    4. CKD stage IIIa: Creatinine was 1.66 in 02/2022. Renal ultrasound on 01/12/2022 was unremarkable.  Following with nephrology.    5. H/o migraine: Continue propanolol for prophylaxis.    6. Hyperlipidemia: LDL was 65 in 11/2021.  Continue aspirin, simvastatin, Zetia as above.   7. Disposition: F/u in 2 months as scheduled with Dr. Stanford Breed.   Lenna Sciara, NP 03/09/2022, 3:30 PM

## 2022-03-09 ENCOUNTER — Encounter: Payer: Self-pay | Admitting: Nurse Practitioner

## 2022-03-09 ENCOUNTER — Ambulatory Visit (INDEPENDENT_AMBULATORY_CARE_PROVIDER_SITE_OTHER): Payer: Medicare Other | Admitting: Nurse Practitioner

## 2022-03-09 VITALS — BP 140/72 | HR 56 | Ht 69.0 in | Wt 157.2 lb

## 2022-03-09 DIAGNOSIS — I6523 Occlusion and stenosis of bilateral carotid arteries: Secondary | ICD-10-CM | POA: Diagnosis not present

## 2022-03-09 DIAGNOSIS — E785 Hyperlipidemia, unspecified: Secondary | ICD-10-CM | POA: Diagnosis not present

## 2022-03-09 DIAGNOSIS — I10 Essential (primary) hypertension: Secondary | ICD-10-CM | POA: Diagnosis not present

## 2022-03-09 DIAGNOSIS — Z8669 Personal history of other diseases of the nervous system and sense organs: Secondary | ICD-10-CM | POA: Diagnosis not present

## 2022-03-09 DIAGNOSIS — N1831 Chronic kidney disease, stage 3a: Secondary | ICD-10-CM

## 2022-03-09 DIAGNOSIS — I2510 Atherosclerotic heart disease of native coronary artery without angina pectoris: Secondary | ICD-10-CM

## 2022-03-09 NOTE — Patient Instructions (Signed)
Medication Instructions:  Your physician recommends that you continue on your current medications as directed. Please refer to the Current Medication list given to you today.   *If you need a refill on your cardiac medications before your next appointment, please call your pharmacy*   Lab Work: NONE ordered at this time of appointment   If you have labs (blood work) drawn today and your tests are completely normal, you will receive your results only by: [facility] (if you have MyChart) OR A paper copy in the mail If you have any lab test that is abnormal or we need to change your treatment, we will call you to review the results.   Testing/Procedures: NONE ordered at this time of appointment     Follow-Up: At [facility], you and your health needs are our priority.  As part of our continuing mission to provide you with exceptional heart care, we have created designated Provider Care Teams.  These Care Teams include your primary Cardiologist (physician) and Advanced Practice Providers (APPs -  Physician Assistants and Nurse Practitioners) who all work together to provide you with the care you need, when you need it.  We recommend signing up for the patient portal called "MyChart".  Sign up information is provided on this After Visit Summary.  MyChart is used to connect with patients for Virtual Visits (Telemedicine).  Patients are able to view lab/test results, encounter notes, upcoming appointments, etc.  Non-urgent messages can be sent to your provider as well.   To learn more about what you can do with MyChart, go to [email or web address].    Your next appointment:    Keep 05/11/22 apt   The format for your next appointment:   In Person  Provider:   Kirk Ruths, MD     Other Instructions   Important Information About Sugar      Your physician recommends that you continue on your current medications as directed. Please refer to the Current Medication list given  to you today.

## 2022-03-21 DIAGNOSIS — D225 Melanocytic nevi of trunk: Secondary | ICD-10-CM | POA: Diagnosis not present

## 2022-03-21 DIAGNOSIS — L814 Other melanin hyperpigmentation: Secondary | ICD-10-CM | POA: Diagnosis not present

## 2022-03-21 DIAGNOSIS — L570 Actinic keratosis: Secondary | ICD-10-CM | POA: Diagnosis not present

## 2022-03-21 DIAGNOSIS — Z85820 Personal history of malignant melanoma of skin: Secondary | ICD-10-CM | POA: Diagnosis not present

## 2022-03-21 DIAGNOSIS — Z85828 Personal history of other malignant neoplasm of skin: Secondary | ICD-10-CM | POA: Diagnosis not present

## 2022-03-21 DIAGNOSIS — L821 Other seborrheic keratosis: Secondary | ICD-10-CM | POA: Diagnosis not present

## 2022-03-21 DIAGNOSIS — Z08 Encounter for follow-up examination after completed treatment for malignant neoplasm: Secondary | ICD-10-CM | POA: Diagnosis not present

## 2022-03-23 DIAGNOSIS — N1831 Chronic kidney disease, stage 3a: Secondary | ICD-10-CM | POA: Diagnosis not present

## 2022-04-04 DIAGNOSIS — N2581 Secondary hyperparathyroidism of renal origin: Secondary | ICD-10-CM | POA: Diagnosis not present

## 2022-04-04 DIAGNOSIS — N1831 Chronic kidney disease, stage 3a: Secondary | ICD-10-CM | POA: Diagnosis not present

## 2022-04-04 DIAGNOSIS — I129 Hypertensive chronic kidney disease with stage 1 through stage 4 chronic kidney disease, or unspecified chronic kidney disease: Secondary | ICD-10-CM | POA: Diagnosis not present

## 2022-04-04 DIAGNOSIS — D631 Anemia in chronic kidney disease: Secondary | ICD-10-CM | POA: Diagnosis not present

## 2022-04-09 ENCOUNTER — Encounter: Payer: Self-pay | Admitting: Family Medicine

## 2022-04-09 DIAGNOSIS — E039 Hypothyroidism, unspecified: Secondary | ICD-10-CM

## 2022-04-11 MED ORDER — LEVOTHYROXINE SODIUM 112 MCG PO TABS: 112.0000 ug | ORAL_TABLET | Freq: Every day | ORAL | 1 refills | Status: DC

## 2022-04-18 NOTE — Progress Notes (Signed)
NEUROLOGY FOLLOW UP OFFICE NOTE  EMARI DEMMER [id number]  Assessment/Plan:   Migraine without aura, without status migrainosus, not intractable Essential tremor Mild neurocognitive disorder   Migraine prevention:  Propranolol ER 172m daily, topiramate 553mat bedtime, Cefaly.  He started himself on Boswellia as well. Essential tremor management:  propranolol ER 16070maily, topiramate 47m66m bedtime Migraine rescue:  Cefaly, Tylenol, Ginger Tea Limit use of pain relievers to no more than 2 days out of week to prevent risk of rebound or medication-overuse headache. Keep headache diary Follow up 9 months     Subjective:  JoseBOHDAN MACHOa 76 y53r old right-handed male with osteoarthritis, hypertension, type 2 diabetes mellitus, hyperlipidemia and s/p mastoidectomy and tympanic membrane repair who follows up for migraines and essential tremor.   UPDATE: ***  I  Migraine:  He is doing well.   Migraines usually were occurring 1 to 2 a month.  Started Boswellia a month ago and hasn't had a migraine since. Frequency of abortive medication: infrequent Rescue protocol:  Ginger tea with Tylenol, Cefaly Current NSAIDS:  piroxiicam daily (for arthritis) Current analgesics:  no Current triptans:  no Current ergotamine:  no Current anti-emetic:  no Current muscle relaxants:  no Current anti-anxiolytic:  no Current sleep aide:  no Current Antihypertensive medications:  Losartan-HCTZ, propranolol ER 160mg24mrent Antidepressant medications:  no Current Anticonvulsant medications:  topiramate 47mg 72medtime Current anti-CGRP:  no Current Vitamins/Herbal/Supplements: Boswellia Extract, Ginger tea, biotin, multivitamin, fish oil, coenzyme Q 10, cinnamon, gingerroot, turmeric 500 mg, magnesium 250 mg, riboflavin 400 mg, butterbur 150 mg Current Antihistamines/Decongestants: Flonase Other therapy: Cephaly (once a day).  Treat acutely with ginger tea and rest.   Caffeine: 2 to 3 cups  of coffee daily Alcohol: No Smoker: No Diet: Hydrates.  No soda. Exercise: Yes Depression: Stable; Anxiety: Stable Other pain:  no Sleep hygiene: He wakes up every hour to urinate   II Essential Tremor:  He is taking propranolol ER 160 mg daily and topiramate 47mg d56m.  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       HISTORY: I Migraine:  Onset: In his 20s or 64s.  They would occur once or twice a year.  In September, they started to occur once or twice a week and then almost daily.  Over the past couple of weeks (following tympanostomy tube placement), they have occurred about once a week. Location:  Top of head Quality:  explode Initial Intensity:  10/10 Aura:  Flashing lights/colors in vision Prodrome:  no Postdrome:  Hangover effect for up to 5 days Associated symptoms: Nausea, photophobia, phonophobia.  Denies unilateral numbness and weakness. Initial Duration:  Several hours Initial Frequency:   In September, they started to occur once or twice a week and then almost daily.  Over the past couple of weeks (following tympanostomy tube placement), they have occurred about once a week. Triggers: Emotional stress, sound, NTG, light, wine, eye strain Relieving factors: Sleep Activity:  Needs to lay down   Past NSAIDS:  Celebrex Past analgesics:  no Past abortive triptans:  Sumatriptan (stopped due to CAD). Past muscle relaxants:  no Past anti-emetic:  no Past antihypertensive medications:  none Past antidepressant medications:  Prozac, amitriptyline Past anticonvulsant medications:  no Past vitamins/Herbal/Supplements:  no Other past therapies:  Biofeedback, relaxation therapy   MRI of brain without contrast from 08/11/16 showed mild chronic small vessel ischemic changes but no acute stroke, bleed or mass lesion.  However, it did reveal  mild mastoiditis.  He saw ENT and had tympanostomy tube placement on 09/22/16.  Due to worsening headaches, he had an MRI  brain with and without contrast was performed on 01/02/2020 which showed no acute intracranial abnormality. History of double vision.  Had prisms made for his glasses which helped.   II  Essential Tremor: For over 10 years, he has had a tremor, particularly in his right hand.  It has become more noticeable.  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  There is no known family history.  III  Memory Deficits: Neuropsychological evaluation on 12/25/2020 showed evidence of mild neurocognitive disorder, however it may be related to medication (topiramate) or migraines but at this time not convincing for a neurodegenerative disease.  He notes that he sometimes may have word-finding issues which can be a problem during conversation.      PAST MEDICAL HISTORY: Past Medical History:  Diagnosis Date   Abdominal bruit 07/22/2010   Acute sinusitis 03/26/2015   Acute suppr otitis media w spon rupt ear drum, recur, r ear 04/06/2017   Angina pectoris 12/11/2008   Benign essential tremor 08/29/2017   Bilateral sensorineural hearing loss 11/30/2015   CAD S/P percutaneous coronary angioplasty 12/11/2008   LAD PCI '03,  Cath 2010 showed 40% LAD- medical Rx Myoview March 2020 negative for ischemia but positive for ST depression- Imdur added.  Formatting of this note might be different from the original. LAD PCI '03,  Cath 2010 showed 40% LAD- medical Rx Myoview March 2020 negative for ischemia but positive for ST depression- Imdur added.  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 Echo May 2020- EF 60-65% with mild LVH  Last Assessment & Plan:  Well controlled, no changes to meds. Encouraged heart healthy diet such as the DASH diet and exercise as tolerated.   Eustachian tube dysfunction, bilateral 11/30/2015   History of colonic polyps 07/12/2010   History of GI bleed 04/23/2020   History of left mastoidectomy 02/20/2020   History of placement of ear tubes    Hyperlipidemia associated with type 2 diabetes mellitus 07/10/2007   Hyperthyroidism 07/12/2010   Hypothyroidism 07/10/2007   Internal hemorrhoids    Intractable episodic cluster headache 08/29/2017   Major depressive disorder 07/10/2007   Mastoiditis of both sides 09/21/2016   Migraine headache    Mild neurocognitive disorder due to multiple etiologies 12/25/2020   Non-insulin dependent type 2 diabetes mellitus 07/06/2010   Osteoarthritis of multiple joints 07/10/2007   Perennial allergic rhinitis 09/22/2016   Postprocedural urinary retention    Presbycusis of both ears 01/23/2018   Skin cancer, basal cell    Thrombocytopenia    TIA (transient ischemic attack) 2006   per patient's report. He was never officially given this diagnosis   Upper airway cough syndrome 09/25/2017    MEDICATIONS: Current Outpatient Medications on File Prior to Visit  Medication Sig Dispense Refill   aspirin 81 MG tablet Take 81 mg by mouth daily.  azelastine (ASTELIN) 0.1 % nasal spray Place 1 spray into both nostrils daily as needed for rhinitis. Use in each nostril as directed     Biotin 1000 MCG tablet Take 1,000 mcg by mouth 2 (two) times daily.     Boswellia Serrata (BOSWELLIA PO) Take 500 mg by mouth in the morning and at bedtime.     cholecalciferol (VITAMIN D) 25 MCG (1000 UNIT) tablet Take 3,000 Units by mouth 3 (three) times daily.     Cinnamon 500 MG capsule Take 1,000 mg by mouth 2 (two) times daily.     Coenzyme Q10 (COQ-10) 100 MG capsule Take 1  capsule (100 mg total) by mouth daily.     Cyanocobalamin (VITAMIN B-12) 1000 MCG SUBL Take 1,000 mcg by mouth daily.     Diclofenac Sodium 3 % GEL Qid prn (Patient taking differently: Apply 1 application. topically daily as needed (Arthritis).) 100 g 3   ezetimibe (ZETIA) 10 MG tablet TAKE ONE TABLET BY MOUTH DAILY 90 tablet 3   Ginger, Zingiber officinalis, (GINGER ROOT) 550 MG CAPS Take 550 mg by mouth 2 (two) times daily.     hydrALAZINE (APRESOLINE) 50 MG tablet Take 1 tablet (50 mg total) by mouth 3 (three) times daily. 270 tablet 3   levothyroxine (SYNTHROID) 112 MCG tablet Take 1 tablet (112 mcg total) by mouth daily before breakfast. 90 tablet 1   metFORMIN (GLUCOPHAGE) 500 MG tablet Take 1 tablet (500 mg total) by mouth 2 (two) times daily with a meal. 180 tablet 3   Multiple Vitamins-Minerals (THERAGRAN-M ADVANCED 50 PLUS PO) Take 1 tablet by mouth daily.     Nerve Stimulator (CEFALY KIT) DEVI Apply 1 application topically daily at 12 noon. Electrical migraine kit for prevention on treatment of migraine headache     pantoprazole (PROTONIX) 40 MG tablet Take 1 tablet (40 mg total) by mouth daily. 90 tablet 1   propranolol ER (INDERAL LA) 160 MG SR capsule TAKE ONE CAPSULE BY MOUTH DAILY 90 capsule 1   simvastatin (ZOCOR) 40 MG tablet Take 1 tablet (40 mg total) by mouth at bedtime. 90 tablet 3   tamsulosin (FLOMAX) 0.4 MG CAPS capsule Take 0.4 mg by mouth in the morning and at bedtime. 30 capsule    topiramate (TOPAMAX) 50 MG tablet TAKE ONE TABLET BY MOUTH EVERY NIGHT AT BEDTIME 60 tablet 0   valsartan (DIOVAN) 80 MG tablet Take 1 tablet (80 mg total) by mouth daily. 30 tablet 11   No current facility-administered medications on file prior to visit.    ALLERGIES: Allergies  Allergen Reactions   Sulfa Antibiotics Other (See Comments)    Severe crippling  joint and muscle pain and redness   Sulfamethoxazole-Trimethoprim Other (See Comments)    Joint and muscle pain   Sulfasalazine  Other (See Comments)    Joint and muscle pain   Amlodipine Swelling   Codeine Other (See Comments)    Unknown   Crestor [Rosuvastatin Calcium] Other (See Comments)    Severe joint and muscle pain   Hydrocodone-Acetaminophen Other (See Comments)    To strong vicodin    Nitrofurantoin Swelling    Ankles/legs SOB   Other     Apples and tomatoes...... Nausea and migraines.     Rosuvastatin Other (See Comments)    REACTION: REALLY BAD JOINT/MUSCLE PAIN    FAMILY HISTORY: Family History  Problem Relation Age of Onset   Heart disease Mother    Alzheimer's disease Mother    Heart disease  Father        triple by pass   Hypertension Brother        Prostate Cancer   Prostate cancer Brother    Hypertension Brother    Hypertension Brother    COPD Brother    Colon cancer Neg Hx    Colon polyps Neg Hx    Rectal cancer Neg Hx    Stomach cancer Neg Hx    Esophageal cancer Neg Hx       Objective:  *** General: No acute distress.  Patient appears well-groomed.   Head:  Normocephalic/atraumatic Eyes:  Fundi examined but not visualized Neck: supple, no paraspinal tenderness, full range of motion Heart:  Regular rate and rhythm Neurological Exam: alert and oriented to person, place, and time.  Speech fluent and not dysarthric, language intact.  CN II-XII intact. Bulk and tone normal, muscle strength 5/5 throughout.  Sensation to light touch intact.  Deep tendon reflexes 2+ throughout, toes downgoing.  Finger to nose testing intact.  Gait normal, Romberg negative.   Metta Clines, DO  CC: Roma Schanz, DO

## 2022-04-19 ENCOUNTER — Ambulatory Visit (INDEPENDENT_AMBULATORY_CARE_PROVIDER_SITE_OTHER): Payer: Medicare Other | Admitting: Neurology

## 2022-04-19 ENCOUNTER — Encounter: Payer: Self-pay | Admitting: Neurology

## 2022-04-19 VITALS — BP 177/83 | HR 63 | Ht 69.0 in | Wt 156.0 lb

## 2022-04-19 DIAGNOSIS — I6523 Occlusion and stenosis of bilateral carotid arteries: Secondary | ICD-10-CM

## 2022-04-19 DIAGNOSIS — G250 Essential tremor: Secondary | ICD-10-CM | POA: Diagnosis not present

## 2022-04-19 DIAGNOSIS — I10 Essential (primary) hypertension: Secondary | ICD-10-CM | POA: Diagnosis not present

## 2022-04-19 DIAGNOSIS — G43109 Migraine with aura, not intractable, without status migrainosus: Secondary | ICD-10-CM | POA: Diagnosis not present

## 2022-04-19 NOTE — Progress Notes (Signed)
Medication Samples have been provided to the patient.  Drug name: Roselyn Meier       Strength: 100 mg        Qty: 4  LOT: [id number]  Exp.Date: 06/2024  Dosing instructions: as needed  The patient has been instructed regarding the correct time, dose, and frequency of taking this medication, including desired effects and most common side effects.   Venetia Night 2:24 PM 04/19/2022

## 2022-04-19 NOTE — Patient Instructions (Addendum)
At earliest onset of migraine, take Ubrelvy '100mg'$ .  May repeat after 2hours.  Maximum 2 tablets in 24 hours. Use Cefaly as needed/directed Continue topiramate '50mg'$  at bedtime and propranolol ER '160mg'$  daily Follow up one year.

## 2022-05-01 ENCOUNTER — Other Ambulatory Visit: Payer: Self-pay | Admitting: Neurology

## 2022-05-02 NOTE — Progress Notes (Signed)
HPI:FU CAD with prior PCI of his LAD in May 2003. Cardiac cath 2010 showed 30-40% LAD just distal to the stent. There was no other obstructive disease noted. The ejection fraction is 50%. He has been treated medically. Abdominal ultrasound in Oct 2011 showed no aneurysm. Nuclear study in March 2020 showed ejection fraction 56%.  Patient had significant ST changes but perfusion was normal.  Echocardiogram May 2020 showed normal LV function, mild left atrial enlargement.  Cardiac MRI October 2020 showed basal anteroseptal enhancement which could be seen with prior myocarditis but also could be seen with sarcoid.  Pattern not consistent with amyloid.  Ejection fraction 61% and normal.  Carotid Dopplers February 2023 showed 1 to 39% bilateral stenosis.  Hospitalized February of this year with hypertensive urgency.  Since I last saw him, the patient denies any dyspnea on exertion, orthopnea, PND, pedal edema, palpitations, syncope or chest pain.  His blood pressure has been running high.   Current Outpatient Medications  Medication Sig Dispense Refill   aspirin 81 MG tablet Take 81 mg by mouth daily.     azelastine (ASTELIN) 0.1 % nasal spray Place 1 spray into both nostrils daily as needed for rhinitis. Use in each nostril as directed     Biotin 1000 MCG tablet Take 1,000 mcg by mouth 2 (two) times daily.     Boswellia Serrata (BOSWELLIA PO) Take 500 mg by mouth in the morning and at bedtime.     cholecalciferol (VITAMIN D) 25 MCG (1000 UNIT) tablet Take 3,000 Units by mouth 3 (three) times daily.     Cinnamon 500 MG capsule Take 1,000 mg by mouth 2 (two) times daily.     Coenzyme Q10 (COQ-10) 100 MG capsule Take 1 capsule (100 mg total) by mouth daily.     Cyanocobalamin (VITAMIN B-12) 1000 MCG SUBL Take 1,000 mcg by mouth daily.     Diclofenac Sodium 3 % GEL Qid prn (Patient taking differently: Apply 1 application  topically daily as needed (Arthritis).) 100 g 3   ezetimibe (ZETIA) 10 MG tablet  TAKE ONE TABLET BY MOUTH DAILY 90 tablet 3   Ginger, Zingiber officinalis, (GINGER ROOT) 550 MG CAPS Take 550 mg by mouth 2 (two) times daily.     hydrALAZINE (APRESOLINE) 50 MG tablet Take 1 tablet (50 mg total) by mouth 3 (three) times daily. 270 tablet 3   levothyroxine (SYNTHROID) 112 MCG tablet Take 1 tablet (112 mcg total) by mouth daily before breakfast. 90 tablet 1   metFORMIN (GLUCOPHAGE) 500 MG tablet Take 1 tablet (500 mg total) by mouth 2 (two) times daily with a meal. 180 tablet 3   Multiple Vitamins-Minerals (THERAGRAN-M ADVANCED 50 PLUS PO) Take 1 tablet by mouth daily.     Nerve Stimulator (CEFALY KIT) DEVI Apply 1 application topically daily at 12 noon. Electrical migraine kit for prevention on treatment of migraine headache     pantoprazole (PROTONIX) 40 MG tablet Take 1 tablet (40 mg total) by mouth daily. 90 tablet 1   propranolol ER (INDERAL LA) 160 MG SR capsule TAKE ONE CAPSULE BY MOUTH DAILY 90 capsule 1   simvastatin (ZOCOR) 40 MG tablet Take 1 tablet (40 mg total) by mouth at bedtime. 90 tablet 3   tamsulosin (FLOMAX) 0.4 MG CAPS capsule Take 0.4 mg by mouth in the morning and at bedtime. 30 capsule    topiramate (TOPAMAX) 50 MG tablet TAKE ONE TABLET BY MOUTH EVERY NIGHT AT BEDTIME 90 tablet 1  valsartan (DIOVAN) 80 MG tablet Take 1 tablet (80 mg total) by mouth daily. 30 tablet 11   No current facility-administered medications for this visit.     Past Medical History:  Diagnosis Date   Abdominal bruit 07/22/2010   Acute sinusitis 03/26/2015   Acute suppr otitis media w spon rupt ear drum, recur, r ear 04/06/2017   Angina pectoris 12/11/2008   Benign essential tremor 08/29/2017   Bilateral sensorineural hearing loss 11/30/2015   CAD S/P percutaneous coronary angioplasty 12/11/2008   LAD PCI '03,  Cath 2010 showed 40% LAD- medical Rx Myoview March 2020 negative for ischemia but positive for ST depression- Imdur added.  Formatting of this note might be different  from the original. LAD PCI '03,  Cath 2010 showed 40% LAD- medical Rx Myoview March 2020 negative for ischemia but positive for ST depression- Imdur added.  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 Echo May 2020- EF 60-65% with mild LVH  Last Assessment & Plan:  Well controlled, no changes to meds. Encouraged heart healthy diet such as the DASH diet and exercise as tolerated.   Eustachian tube dysfunction, bilateral 11/30/2015   History of colonic polyps 07/12/2010   History of GI bleed 04/23/2020   History of left mastoidectomy 02/20/2020   History of placement of ear tubes    Hyperlipidemia associated with type 2 diabetes mellitus 07/10/2007   Hyperthyroidism 07/12/2010   Hypothyroidism 07/10/2007   Internal hemorrhoids    Intractable episodic cluster headache 08/29/2017   Major depressive disorder 07/10/2007   Mastoiditis of both sides 09/21/2016   Migraine headache    Mild neurocognitive disorder due to multiple etiologies 12/25/2020   Non-insulin dependent type 2 diabetes mellitus 07/06/2010   Osteoarthritis of multiple joints 07/10/2007   Perennial allergic rhinitis 09/22/2016   Postprocedural urinary retention    Presbycusis of both ears 01/23/2018   Skin cancer, basal cell    Thrombocytopenia    TIA (transient ischemic attack) 2006   per patient's report. He was never officially given this diagnosis   Upper airway cough syndrome 09/25/2017    Past Surgical History:  Procedure Laterality Date    COLONOSCOPY  2011   coronary artery disease status post placement     of drug-eluting stent in the LAD in 2003,eEF 65% then   [place] N/A 12/08/2020   Procedure: CLOSURE LEFT POST AURICULAR SCALP WOUND;  Surgeon: Irene Limbo, MD;  Location: [facility];  Service: Plastics;  Laterality: N/A;   esophogeal dilation     fatty tissue removed     from neck 2003   HERNIA REPAIR  2007   mastoidectomy with tympanoplasty     MELANOMA EXCISION Left 11/17/2020   Procedure: WIDE LOCAL EXCISION, ADVANCED FLAP CLOSURE LEFT POSTERIOR EAR MELANOMA;  Surgeon: Stark Klein, MD;  Location: [facility];  Service: General;  Laterality: Left;   MELANOMA EXCISION Left 12/01/2020   Procedure: RE-EXCISION LEFT POSTERIOR AURICULAR MELANOMA WITH TEMPORARY CLOSURE;  Surgeon: Stark Klein,  MD;  Location: [facility];  Service: General;  Laterality: Left;  60 MIN TOTAL   POLYPECTOMY  2011   +TA   right toe bone spur surgery     SKIN FULL THICKNESS GRAFT Left 12/08/2020   Procedure: Full thickness skin graft from left upper arm;  Surgeon: Irene Limbo, MD;  Location: [facility];  Service: Plastics;  Laterality: Left;   THYROIDECTOMY, PARTIAL  07/2002   right thyroid   TONSILLECTOMY     UPPER GI ENDOSCOPY      Social History   Socioeconomic History   Marital status: Married    Spouse name: Charleen   Number of children: 0   Years of education: 14   Highest education level: Associate degree: occupational, Hotel manager, or vocational program  Occupational History   Occupation: retired    Fish farm manager: RETIRED    Comment: accountant  Tobacco Use   Smoking status: Former    Years: 5.00    Types: Cigarettes    Quit date: 10/10/1965    Years since quitting: 77.6   Smokeless tobacco: Never  Vaping Use   Vaping Use: Never used  Substance and Sexual Activity   Alcohol use: No   Drug use: No   Sexual activity: Yes    Partners: Female  Other Topics  Concern   Not on file  Social History Narrative   Pt lives with spouse at private home in 2 story home- he has no children- right handed- he drinks coffee daily, tea sometimes, soda not all the time. Exercise-- treadmill, stationary bike   Right handed   Social Determinants of Health   Financial Resource Strain: Low Risk  (11/08/2021)   Overall Financial Resource Strain (CARDIA)    Difficulty of Paying Living Expenses: Not hard at all  Food Insecurity: No Food Insecurity (11/08/2021)   Hunger Vital Sign    Worried About Running Out of Food in the Last Year: Never true    Ran Out of Food in the Last Year: Never true  Transportation Needs: No Transportation Needs (11/08/2021)   PRAPARE - Hydrologist (Medical): No    Lack of Transportation (Non-Medical): No  Physical Activity: Sufficiently Active (11/08/2021)   Exercise Vital Sign    Days of Exercise per Week: 7 days    Minutes of Exercise per Session: 30 min  Stress: No Stress Concern Present (11/08/2021)   [place]    Feeling of Stress : Not at all  Social Connections: Moderately Integrated (11/08/2021)   Social Connection and Isolation Panel [NHANES]    Frequency of Communication with Friends and Family: Three times a week    Frequency of Social Gatherings with Friends and Family: More than three times a week    Attends Religious Services: More than 4 times per year    Active Member of [employer] or Organizations: No    Attends Archivist Meetings: Never    Marital Status: Married  Human resources officer Violence: Not At Risk (11/08/2021)   Humiliation, Afraid, Rape, and Kick questionnaire    Fear of Current or Ex-Partner: No    Emotionally Abused: No    Physically Abused: No    Sexually Abused: No    Family History  Problem Relation Age of Onset   Heart disease Mother    Alzheimer's disease Mother    Heart disease Father         triple by pass  Hypertension Brother        Prostate Cancer   Prostate cancer Brother    Hypertension Brother    Hypertension Brother    COPD Brother    Colon cancer Neg Hx    Colon polyps Neg Hx    Rectal cancer Neg Hx    Stomach cancer Neg Hx    Esophageal cancer Neg Hx     ROS: no fevers or chills, productive cough, hemoptysis, dysphasia, odynophagia, melena, hematochezia, dysuria, hematuria, rash, seizure activity, orthopnea, PND, pedal edema, claudication. Remaining systems are negative.  Physical Exam: Well-developed well-nourished in no acute distress.  Skin is warm and dry.  HEENT is normal.  Neck is supple.  Chest is clear to auscultation with normal expansion.  Cardiovascular exam is regular rate and rhythm.  Abdominal exam nontender or distended. No masses palpated. Extremities show no edema. neuro grossly intact  A/P  1 coronary artery disease-patient denies chest pain.  Continue aspirin and statin.  2 hypertension-patient's blood pressure is elevated.  Increase hydralazine to 75 mg p.o. 3 times daily and follow.  His pressure has been more difficult to control recently.  We will arrange renal Dopplers to exclude renal artery stenosis.  3 hyperlipidemia-continue statin.  4 history of lower extremity edema-this resolved after previously treating his anemia and was felt secondary to high-output CHF.  Kirk Ruths, MD

## 2022-05-12 ENCOUNTER — Encounter: Payer: Self-pay | Admitting: Cardiology

## 2022-05-12 ENCOUNTER — Ambulatory Visit (INDEPENDENT_AMBULATORY_CARE_PROVIDER_SITE_OTHER): Payer: Medicare Other | Admitting: Cardiology

## 2022-05-12 VITALS — BP 150/70 | HR 64 | Ht 69.0 in | Wt 155.2 lb

## 2022-05-12 DIAGNOSIS — I10 Essential (primary) hypertension: Secondary | ICD-10-CM

## 2022-05-12 DIAGNOSIS — I6523 Occlusion and stenosis of bilateral carotid arteries: Secondary | ICD-10-CM

## 2022-05-12 DIAGNOSIS — E785 Hyperlipidemia, unspecified: Secondary | ICD-10-CM | POA: Diagnosis not present

## 2022-05-12 DIAGNOSIS — I2510 Atherosclerotic heart disease of native coronary artery without angina pectoris: Secondary | ICD-10-CM

## 2022-05-12 MED ORDER — HYDRALAZINE HCL 50 MG PO TABS: 75.0000 mg | ORAL_TABLET | Freq: Three times a day (TID) | ORAL | 3 refills | Status: DC

## 2022-05-12 NOTE — Patient Instructions (Signed)
Medication Instructions:   INCREASE HYDRALAZINE TO 75 MG THREE TIMES DAILY= 1 And 1/2 of the 50 MG TABLET THREE TIMES DAILY  *If you need a refill on your cardiac medications before your next appointment, please call your pharmacy*   Testing:  Your physician has requested that you have a renal artery duplex. During this test, an ultrasound is used to evaluate blood flow to the kidneys. Allow one hour for this exam. Do not eat after midnight the day before and avoid carbonated beverages. Take your medications as you usually do. [facility]  Follow-Up: At [facility], you and your health needs are our priority.  As part of our continuing mission to provide you with exceptional heart care, we have created designated Provider Care Teams.  These Care Teams include your primary Cardiologist (physician) and Advanced Practice Providers (APPs -  Physician Assistants and Nurse Practitioners) who all work together to provide you with the care you need, when you need it.  We recommend signing up for the patient portal called "MyChart".  Sign up information is provided on this After Visit Summary.  MyChart is used to connect with patients for Virtual Visits (Telemedicine).  Patients are able to view lab/test results, encounter notes, upcoming appointments, etc.  Non-urgent messages can be sent to your provider as well.   To learn more about what you can do with MyChart, go to [email or web address].    Your next appointment:   6 month(s)  The format for your next appointment:   In Person  Provider:   Kirk Ruths, MD

## 2022-05-20 ENCOUNTER — Ambulatory Visit (HOSPITAL_COMMUNITY)
Admission: RE | Admit: 2022-05-20 | Discharge: 2022-05-20 | Disposition: A | Discharge status: Home / Self Care | Payer: Medicare Other | Source: Ambulatory Visit | Attending: Cardiovascular Disease | Admitting: Cardiovascular Disease

## 2022-05-20 DIAGNOSIS — I10 Essential (primary) hypertension: Secondary | ICD-10-CM | POA: Diagnosis not present

## 2022-05-23 ENCOUNTER — Ambulatory Visit (INDEPENDENT_AMBULATORY_CARE_PROVIDER_SITE_OTHER): Payer: Medicare Other | Admitting: Family Medicine

## 2022-05-23 ENCOUNTER — Encounter: Payer: Self-pay | Admitting: Family Medicine

## 2022-05-23 VITALS — BP 150/82 | HR 65 | Temp 98.0°F | Resp 18 | Ht 69.0 in | Wt 155.2 lb

## 2022-05-23 DIAGNOSIS — I10 Essential (primary) hypertension: Secondary | ICD-10-CM

## 2022-05-23 DIAGNOSIS — E039 Hypothyroidism, unspecified: Secondary | ICD-10-CM | POA: Diagnosis not present

## 2022-05-23 DIAGNOSIS — E1165 Type 2 diabetes mellitus with hyperglycemia: Secondary | ICD-10-CM | POA: Diagnosis not present

## 2022-05-23 DIAGNOSIS — E1169 Type 2 diabetes mellitus with other specified complication: Secondary | ICD-10-CM | POA: Diagnosis not present

## 2022-05-23 DIAGNOSIS — I6523 Occlusion and stenosis of bilateral carotid arteries: Secondary | ICD-10-CM

## 2022-05-23 DIAGNOSIS — E119 Type 2 diabetes mellitus without complications: Secondary | ICD-10-CM

## 2022-05-23 DIAGNOSIS — E785 Hyperlipidemia, unspecified: Secondary | ICD-10-CM

## 2022-05-23 NOTE — Assessment & Plan Note (Signed)
Slightly elevated ------ cardiology , nephrology working with pt  con't meds

## 2022-05-23 NOTE — Assessment & Plan Note (Signed)
Check labs 

## 2022-05-23 NOTE — Patient Instructions (Signed)

## 2022-05-23 NOTE — Assessment & Plan Note (Signed)
Encourage heart healthy diet such as MIND or DASH diet, increase exercise, avoid trans fats, simple carbohydrates and processed foods, consider a krill or fish or flaxseed oil cap daily.  °

## 2022-05-23 NOTE — Progress Notes (Signed)
Subjective:   By signing my name below, I, Carylon Perches, attest that this documentation has been prepared under the direction and in the presence of [name] DO 05/23/2022     Patient ID: [name], male    DOB: 10/19/1945, 76 y.o.   MRN: [id number]  Chief Complaint  Patient presents with   Hypothyroidism   Hyperlipidemia   Diabetes   Follow-up    HPI Patient is in today for an office visit   As of today's visit, his blood pressure is elevating. He is regularly following up with Dr. Stanford Breed. He reports that his Hydralazine medication was changed from 25 Mg to 75 Mg and to 1.5 tablets three times a day. He is also taking 80 Mg of Valsartan. He has swelling symptoms when using Amlodipine. An ultrasound was done to check his kidneys and he reports that results were normal however, there were some stones found in his bladder.  BP Readings from Last 3 Encounters:  05/23/22 (!) 150/82  05/12/22 (!) 150/70  04/19/22 (!) 177/83   Pulse Readings from Last 3 Encounters:  05/23/22 65  05/12/22 64  04/19/22 63   He does not regularly check his blood sugar levels. He has started a new supplement called Moringa Oleifera 6000 Mg.  Lab Results  Component Value Date   HGBA1C 4.9 11/18/2021   He is scheduled for a hearing appointment next week.   Past Medical History:  Diagnosis Date   Abdominal bruit 07/22/2010   Acute sinusitis 03/26/2015   Acute suppr otitis media w spon rupt ear drum, recur, r ear 04/06/2017   Angina pectoris 12/11/2008   Benign essential tremor 08/29/2017   Bilateral sensorineural hearing loss 11/30/2015   CAD S/P percutaneous coronary angioplasty 12/11/2008   LAD PCI '03,  Cath 2010 showed 40% LAD- medical Rx Myoview March 2020 negative for ischemia but positive for ST depression- Imdur added.  Formatting of this note might be different from the original. LAD PCI '03,  Cath 2010 showed 40% LAD- medical Rx Myoview March 2020 negative for ischemia  but positive for ST depression- Imdur added.  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 Echo May 2020- EF 60-65% with mild LVH  Last Assessment & Plan:  Well controlled, no changes to meds. Encouraged heart healthy diet such as the DASH diet and exercise as tolerated.   Eustachian tube dysfunction, bilateral 11/30/2015   History of colonic polyps 07/12/2010   History of GI bleed 04/23/2020   History of left mastoidectomy 02/20/2020   History of placement of ear tubes    Hyperlipidemia associated with type 2 diabetes mellitus 07/10/2007   Hyperthyroidism 07/12/2010   Hypothyroidism 07/10/2007   Internal hemorrhoids    Intractable episodic cluster headache 08/29/2017   Major depressive disorder 07/10/2007   Mastoiditis of both sides 09/21/2016   Migraine headache    Mild neurocognitive disorder due to multiple etiologies 12/25/2020   Non-insulin dependent type 2 diabetes mellitus 07/06/2010   Osteoarthritis of multiple  joints 07/10/2007   Perennial allergic rhinitis 09/22/2016   Postprocedural urinary retention    Presbycusis of both ears 01/23/2018   Skin cancer, basal cell    Thrombocytopenia    TIA (transient ischemic attack) 2006   per patient's report. He was never officially given this diagnosis   Upper airway cough syndrome 09/25/2017    Past Surgical History:  Procedure Laterality Date   COLONOSCOPY  2011   coronary artery disease status post placement     of drug-eluting stent in the LAD in 2003,eEF 65%  then   [place] N/A 12/08/2020   Procedure: CLOSURE LEFT POST AURICULAR SCALP WOUND;  Surgeon: Irene Limbo, MD;  Location: [facility];  Service: Plastics;  Laterality: N/A;   esophogeal dilation     fatty tissue removed     from neck 2003   HERNIA REPAIR  2007   mastoidectomy with tympanoplasty     MELANOMA EXCISION Left 11/17/2020   Procedure: WIDE LOCAL EXCISION, ADVANCED FLAP CLOSURE LEFT POSTERIOR EAR MELANOMA;  Surgeon: Stark Klein, MD;  Location: [facility];  Service: General;  Laterality: Left;   MELANOMA EXCISION Left 12/01/2020   Procedure: RE-EXCISION LEFT POSTERIOR AURICULAR MELANOMA WITH TEMPORARY CLOSURE;  Surgeon: Stark Klein, MD;  Location: [facility];  Service: General;  Laterality: Left;  60 MIN TOTAL   POLYPECTOMY  2011   +TA   right toe bone spur surgery     SKIN FULL THICKNESS GRAFT Left 12/08/2020   Procedure: Full thickness skin graft from left upper arm;  Surgeon: Irene Limbo, MD;  Location: [facility];  Service: Plastics;  Laterality: Left;   THYROIDECTOMY, PARTIAL  07/2002   right thyroid   TONSILLECTOMY     UPPER GI ENDOSCOPY      Family History  Problem Relation Age of Onset   Heart disease Mother    Alzheimer's disease Mother    Heart disease Father        triple by pass   Hypertension Brother        Prostate Cancer   Prostate cancer Brother    Hypertension Brother    Hypertension Brother    COPD Brother    Colon cancer Neg Hx    Colon polyps Neg Hx    Rectal cancer Neg Hx    Stomach cancer Neg Hx    Esophageal cancer Neg Hx     Social History   Socioeconomic History   Marital status: Married    Spouse name: Charleen   Number of children: 0   Years of education: 14   Highest education level: Associate degree: occupational, Hotel manager, or vocational program  Occupational History   Occupation: retired    Fish farm manager: RETIRED    Comment: accountant  Tobacco Use   Smoking  status: Former    Years: 5.00    Types: Cigarettes    Quit date: 10/10/1965    Years since quitting: 15.6   Smokeless tobacco: Never  Vaping Use   Vaping Use: Never used  Substance and Sexual Activity   Alcohol use: No   Drug use: No   Sexual activity: Yes    Partners: Female  Other Topics Concern   Not on file  Social History Narrative   Pt lives with spouse at private home in 2 story home- he has no children- right handed- he drinks coffee daily, tea sometimes, soda not all the time. Exercise-- treadmill, stationary bike   Right handed  Social Determinants of Health   Financial Resource Strain: Low Risk  (11/08/2021)   Overall Financial Resource Strain (CARDIA)    Difficulty of Paying Living Expenses: Not hard at all  Food Insecurity: No Food Insecurity (11/08/2021)   Hunger Vital Sign    Worried About Running Out of Food in the Last Year: Never true    Ran Out of Food in the Last Year: Never true  Transportation Needs: No Transportation Needs (11/08/2021)   PRAPARE - Hydrologist (Medical): No    Lack of Transportation (Non-Medical): No  Physical Activity: Sufficiently Active (11/08/2021)   Exercise Vital Sign    Days of Exercise per Week: 7 days    Minutes of Exercise per Session: 30 min  Stress: No Stress Concern Present (11/08/2021)   [place]    Feeling of Stress : Not at all  Social Connections: Moderately Integrated (11/08/2021)   Social Connection and Isolation Panel [NHANES]    Frequency of Communication with Friends and Family: Three times a week    Frequency of Social Gatherings with Friends and Family: More than three times a week    Attends Religious Services: More than 4 times per year    Active Member of [employer] or Organizations: No    Attends Archivist Meetings: Never    Marital Status: Married  Human resources officer Violence: Not At Risk (11/08/2021)    Humiliation, Afraid, Rape, and Kick questionnaire    Fear of Current or Ex-Partner: No    Emotionally Abused: No    Physically Abused: No    Sexually Abused: No    Outpatient Medications Prior to Visit  Medication Sig Dispense Refill   aspirin 81 MG tablet Take 81 mg by mouth daily.     azelastine (ASTELIN) 0.1 % nasal spray Place 1 spray into both nostrils daily as needed for rhinitis. Use in each nostril as directed     Biotin 1000 MCG tablet Take 1,000 mcg by mouth 2 (two) times daily.     Boswellia Serrata (BOSWELLIA PO) Take 500 mg by mouth in the morning and at bedtime.     cholecalciferol (VITAMIN D) 25 MCG (1000 UNIT) tablet Take 3,000 Units by mouth 3 (three) times daily.     Cinnamon 500 MG capsule Take 1,000 mg by mouth 2 (two) times daily.     Coenzyme Q10 (COQ-10) 100 MG capsule Take 1 capsule (100 mg total) by mouth daily.     Cyanocobalamin (VITAMIN B-12) 1000 MCG SUBL Take 1,000 mcg by mouth daily.     Diclofenac Sodium 3 % GEL Qid prn (Patient taking differently: Apply 1 application  topically daily as needed (Arthritis).) 100 g 3   ezetimibe (ZETIA) 10 MG tablet TAKE ONE TABLET BY MOUTH DAILY 90 tablet 3   Ginger, Zingiber officinalis, (GINGER ROOT) 550 MG CAPS Take 550 mg by mouth 2 (two) times daily.     hydrALAZINE (APRESOLINE) 50 MG tablet Take 1.5 tablets (75 mg total) by mouth 3 (three) times daily. 270 tablet 3   levothyroxine (SYNTHROID) 112 MCG tablet Take 1 tablet (112 mcg total) by mouth daily before breakfast. 90 tablet 1   metFORMIN (GLUCOPHAGE) 500 MG tablet Take 1 tablet (500 mg total) by mouth 2 (two) times daily with a meal. 180 tablet 3   Multiple Vitamins-Minerals (THERAGRAN-M ADVANCED 50 PLUS PO) Take 1 tablet by mouth daily.     Nerve Stimulator (CEFALY KIT)  DEVI Apply 1 application topically daily at 12 noon. Electrical migraine kit for prevention on treatment of migraine headache     pantoprazole (PROTONIX) 40 MG tablet Take 1 tablet (40 mg total) by  mouth daily. 90 tablet 1   propranolol ER (INDERAL LA) 160 MG SR capsule TAKE ONE CAPSULE BY MOUTH DAILY 90 capsule 1   simvastatin (ZOCOR) 40 MG tablet Take 1 tablet (40 mg total) by mouth at bedtime. 90 tablet 3   tamsulosin (FLOMAX) 0.4 MG CAPS capsule Take 0.4 mg by mouth in the morning and at bedtime. 30 capsule    topiramate (TOPAMAX) 50 MG tablet TAKE ONE TABLET BY MOUTH EVERY NIGHT AT BEDTIME 90 tablet 1   valsartan (DIOVAN) 80 MG tablet Take 1 tablet (80 mg total) by mouth daily. 30 tablet 11   No facility-administered medications prior to visit.    Allergies  Allergen Reactions   Sulfa Antibiotics Other (See Comments)    Severe crippling  joint and muscle pain and redness   Sulfamethoxazole-Trimethoprim Other (See Comments)    Joint and muscle pain   Sulfasalazine Other (See Comments)    Joint and muscle pain   Amlodipine Swelling   Codeine Other (See Comments)    Unknown   Crestor [Rosuvastatin Calcium] Other (See Comments)    Severe joint and muscle pain   Hydrocodone-Acetaminophen Other (See Comments)    To strong vicodin    Nitrofurantoin Swelling    Ankles/legs SOB   Other     Apples and tomatoes...... Nausea and migraines.     Rosuvastatin Other (See Comments)    REACTION: REALLY BAD JOINT/MUSCLE PAIN    Review of Systems  Constitutional:  Negative for fever and malaise/fatigue.  HENT:  Negative for congestion.   Eyes:  Negative for blurred vision.  Respiratory:  Negative for shortness of breath.   Cardiovascular:  Negative for chest pain, palpitations and leg swelling.  Gastrointestinal:  Negative for abdominal pain, blood in stool and nausea.  Genitourinary:  Negative for dysuria and frequency.  Musculoskeletal:  Negative for falls.  Skin:  Negative for rash.  Neurological:  Negative for dizziness, loss of consciousness and headaches.  Endo/Heme/Allergies:  Negative for environmental allergies.  Psychiatric/Behavioral:  Negative for depression. The  patient is not nervous/anxious.        Objective:    Physical Exam Vitals and nursing note reviewed.  Constitutional:      General: He is not in acute distress.    Appearance: Normal appearance. He is not ill-appearing.  HENT:     Head: Normocephalic and atraumatic.     Right Ear: External ear normal.     Left Ear: External ear normal.  Eyes:     Extraocular Movements: Extraocular movements intact.     Pupils: Pupils are equal, round, and reactive to light.  Cardiovascular:     Rate and Rhythm: Normal rate and regular rhythm.     Heart sounds: Normal heart sounds. No murmur heard.    No gallop.  Pulmonary:     Effort: Pulmonary effort is normal. No respiratory distress.     Breath sounds: Normal breath sounds. No wheezing or rales.  Musculoskeletal:     Right lower leg: No edema.     Left lower leg: No edema.  Skin:    General: Skin is warm and dry.  Neurological:     Mental Status: He is alert and oriented to person, place, and time.  Psychiatric:  Judgment: Judgment normal.     BP (!) 150/82 (BP Location: Left Arm, Patient Position: Sitting, Cuff Size: Normal)   Pulse 65   Temp 98 F (36.7 C) (Oral)   Resp 18   Ht '5\' 9"'  (1.753 m)   Wt 155 lb 3.2 oz (70.4 kg)   SpO2 98%   BMI 22.92 kg/m  Wt Readings from Last 3 Encounters:  05/23/22 155 lb 3.2 oz (70.4 kg)  05/12/22 155 lb 3.2 oz (70.4 kg)  04/19/22 156 lb (70.8 kg)    Diabetic Foot Exam - Simple   No data filed    Lab Results  Component Value Date   WBC 5.2 11/23/2021   HGB 16.5 11/23/2021   HCT 48.6 11/23/2021   PLT 130.0 (L) 11/23/2021   GLUCOSE 155 (H) 02/17/2022   CHOL 138 11/23/2021   TRIG 53.0 11/23/2021   HDL 61.90 11/23/2021   LDLCALC 65 11/23/2021   ALT 20 11/23/2021   AST 20 11/23/2021   NA 142 02/17/2022   K 4.3 02/17/2022   CL 104 02/17/2022   CREATININE 1.66 (H) 02/17/2022   BUN 33 (H) 02/17/2022   CO2 23 02/17/2022   TSH 2.43 11/23/2021   PSA 10.44 (H) 11/23/2021    INR 1.0 07/01/2019   HGBA1C 4.9 11/18/2021   MICROALBUR 3.6 (H) 11/23/2021    Lab Results  Component Value Date   TSH 2.43 11/23/2021   Lab Results  Component Value Date   WBC 5.2 11/23/2021   HGB 16.5 11/23/2021   HCT 48.6 11/23/2021   MCV 95.6 11/23/2021   PLT 130.0 (L) 11/23/2021   Lab Results  Component Value Date   NA 142 02/17/2022   K 4.3 02/17/2022   CHLORIDE 107 01/05/2017   CO2 23 02/17/2022   GLUCOSE 155 (H) 02/17/2022   BUN 33 (H) 02/17/2022   CREATININE 1.66 (H) 02/17/2022   BILITOT 0.7 11/23/2021   ALKPHOS 70 11/23/2021   AST 20 11/23/2021   ALT 20 11/23/2021   PROT 7.9 11/23/2021   ALBUMIN 4.5 11/23/2021   CALCIUM 9.2 02/17/2022   ANIONGAP 9 11/19/2021   EGFR 42 (L) 02/17/2022   GFR 50.08 (L) 11/23/2021   Lab Results  Component Value Date   CHOL 138 11/23/2021   Lab Results  Component Value Date   HDL 61.90 11/23/2021   Lab Results  Component Value Date   LDLCALC 65 11/23/2021   Lab Results  Component Value Date   TRIG 53.0 11/23/2021   Lab Results  Component Value Date   CHOLHDL 2 11/23/2021   Lab Results  Component Value Date   HGBA1C 4.9 11/18/2021       Assessment & Plan:   Problem List Items Addressed This Visit       Unprioritized   Non-insulin dependent type 2 diabetes mellitus ([place]) (Chronic)    Per endo      Severe hypertension    Slightly elevated ------ cardiology , nephrology working with pt  con't meds       Hypothyroidism - Primary    Check labs       Relevant Orders   CBC with Differential/Platelet   Comprehensive metabolic panel   Hemoglobin A1c   TSH   Microalbumin / creatinine urine ratio   Lipid panel   CBC with Differential/Platelet   Comprehensive metabolic panel   Hemoglobin A1c   TSH   Microalbumin / creatinine urine ratio   Lipid panel   Hyperlipidemia associated with type 2 diabetes  mellitus ([place])    Encourage heart healthy diet such as MIND or DASH diet, increase exercise, avoid  trans fats, simple carbohydrates and processed foods, consider a krill or fish or flaxseed oil cap daily.       Other Visit Diagnoses     Type 2 diabetes mellitus with hyperglycemia, without long-term current use of insulin (HCC)       Relevant Orders   CBC with Differential/Platelet   Comprehensive metabolic panel   Hemoglobin A1c   TSH   Microalbumin / creatinine urine ratio   Lipid panel   Primary hypertension       Relevant Orders   CBC with Differential/Platelet   Comprehensive metabolic panel   Hemoglobin A1c   TSH   Microalbumin / creatinine urine ratio   Lipid panel       No orders of the defined types were placed in this encounter.   IAnn Held, DO, personally preformed the services described in this documentation.  All medical record entries made by the scribe were at my direction and in my presence.  I have reviewed the chart and discharge instructions (if applicable) and agree that the record reflects my personal performance and is accurate and complete. 05/23/2022   I,Amber Collins,acting as a scribe for [name], DO.,have documented all relevant documentation on the behalf of [name], DO,as directed by  [name], DO while in the presence of [name], DO.    [name], DO

## 2022-05-23 NOTE — Assessment & Plan Note (Signed)
Per endo °

## 2022-05-24 LAB — COMPREHENSIVE METABOLIC PANEL
ALT: 16 U/L (ref 0–53)
AST: 18 U/L (ref 0–37)
Albumin: 4.3 g/dL (ref 3.5–5.2)
Alkaline Phosphatase: 57 U/L (ref 39–117)
BUN: 26 mg/dL — ABNORMAL HIGH (ref 6–23)
CO2: 26 mEq/L (ref 19–32)
Calcium: 9.4 mg/dL (ref 8.4–10.5)
Chloride: 106 mEq/L (ref 96–112)
Creatinine, Ser: 1.5 mg/dL (ref 0.40–1.50)
GFR: 44.37 mL/min — ABNORMAL LOW (ref >60.00)
Glucose, Bld: 81 mg/dL (ref 70–99)
Potassium: 4.9 mEq/L (ref 3.5–5.1)
Sodium: 141 mEq/L (ref 135–145)
Total Bilirubin: 0.6 mg/dL (ref 0.2–1.2)
Total Protein: 6.9 g/dL (ref 6.0–8.3)

## 2022-05-24 LAB — LIPID PANEL
Cholesterol: 92 mg/dL (ref 0–200)
HDL: 45.1 mg/dL (ref >39.00)
LDL Cholesterol: 25 mg/dL (ref 0–99)
NonHDL: 46.6
Total CHOL/HDL Ratio: 2
Triglycerides: 108 mg/dL (ref 0.0–149.0)
VLDL: 21.6 mg/dL (ref 0.0–40.0)

## 2022-05-24 LAB — CBC WITH DIFFERENTIAL/PLATELET
Basophils Absolute: 0 10*3/uL (ref 0.0–0.1)
Basophils Relative: 0.8 % (ref 0.0–3.0)
Eosinophils Absolute: 0.4 10*3/uL (ref 0.0–0.7)
Eosinophils Relative: 6.6 % — ABNORMAL HIGH (ref 0.0–5.0)
HCT: 45.6 % (ref 39.0–52.0)
Hemoglobin: 15.4 g/dL (ref 13.0–17.0)
Lymphocytes Relative: 16.2 % (ref 12.0–46.0)
Lymphs Abs: 1.1 10*3/uL (ref 0.7–4.0)
MCHC: 33.8 g/dL (ref 30.0–36.0)
MCV: 95.9 fl (ref 78.0–100.0)
Monocytes Absolute: 0.8 10*3/uL (ref 0.1–1.0)
Monocytes Relative: 12.5 % — ABNORMAL HIGH (ref 3.0–12.0)
Neutro Abs: 4.2 10*3/uL (ref 1.4–7.7)
Neutrophils Relative %: 63.9 % (ref 43.0–77.0)
Platelets: 132 10*3/uL — ABNORMAL LOW (ref 150.0–400.0)
RBC: 4.76 Mil/uL (ref 4.22–5.81)
RDW: 13.3 % (ref 11.5–15.5)
WBC: 6.6 10*3/uL (ref 4.0–10.5)

## 2022-05-24 LAB — MICROALBUMIN / CREATININE URINE RATIO
Creatinine,U: 129 mg/dL
Microalb Creat Ratio: 5.1 mg/g (ref 0.0–30.0)
Microalb, Ur: 6.6 mg/dL — ABNORMAL HIGH (ref 0.0–1.9)

## 2022-05-24 LAB — TSH: TSH: 1.37 u[IU]/mL (ref 0.35–5.50)

## 2022-05-24 LAB — HEMOGLOBIN A1C: Hgb A1c MFr Bld: 5.2 % (ref 4.6–6.5)

## 2022-05-25 ENCOUNTER — Ambulatory Visit: Payer: Self-pay | Admitting: Licensed Clinical Social Worker

## 2022-05-25 NOTE — Patient Outreach (Signed)
  Care Coordination   Initial Visit Note   05/25/2022 Name: HYRUM SHANEYFELT MRN: [id number] DOB: [birth year]  KADIN CANIPE is a 76 y.o. year old male who sees Carollee Herter, Alferd Apa, DO for primary care. I spoke with  [name] / spouse of client, ahyan, kreeger phone today  What matters to the patients health and wellness today?  Client receives support from spouse. Client wants to maintain current activity level   Goals Addressed             This Visit's Progress    Patient receives support from spouse. Client wants to maintain current activity level       Care Coordination Interventions:  Active listening / Reflection utilized  Informed Toussaint Golson, spouse of client, about Care coordination support services Discussed client needs with Ronnie Derby Discussed RN support, LCSW support and Pharmacy support with Care Coordination Services.  Charlene said she was glad to receive information and would keep information if client needed support from this program in the future        SDOH assessments and interventions completed:  No     Care Coordination Interventions Activated:  No  Care Coordination Interventions:  No, not indicated   Follow up plan: No further intervention required.   Encounter Outcome:  Pt. Visit Completed

## 2022-05-25 NOTE — Patient Instructions (Addendum)
Visit Information  Thank you for taking time to visit with me today. Please don't hesitate to contact me if I can be of assistance to you before our next scheduled telephone appointment.  Following are the goals we discussed today:   No further intervention needed at present  Please call the care guide team at [phone] if you need to cancel or reschedule your appointment.   If you are experiencing a Mental Health or [place] or need someone to talk to, please go to [facility] [place] [phone])   Following is a copy of your full plan of care:   Care Coordination Interventions:  Active listening / Reflection utilized  Informed Ugochukwu Chichester, spouse of client, about Care coordination support services Discussed client needs with Ronnie Derby Discussed RN support, LCSW support and Pharmacy support with Care Coordination Services.  Charlene said she was glad to receive information and would keep information if client needed support from this program in the future   Mr. Roulston was given information about Care Management services by the embedded care coordination team including:  Care Management services include personalized support from designated clinical staff supervised by his physician, including individualized plan of care and coordination with other care providers 24/7 contact phone numbers for assistance for urgent and routine care needs. The patient may stop CCM services at any time (effective at the end of the month) by phone call to the office staff.  Patient agreed to services and verbal consent obtained.   Norva Riffle.Verdis Koval MSW, [place] [employer] representative [facility] Care Management [phone]

## 2022-06-02 DIAGNOSIS — Z974 Presence of external hearing-aid: Secondary | ICD-10-CM | POA: Diagnosis not present

## 2022-06-02 DIAGNOSIS — H60393 Other infective otitis externa, bilateral: Secondary | ICD-10-CM | POA: Insufficient documentation

## 2022-06-02 DIAGNOSIS — E119 Type 2 diabetes mellitus without complications: Secondary | ICD-10-CM | POA: Diagnosis not present

## 2022-06-02 DIAGNOSIS — H9113 Presbycusis, bilateral: Secondary | ICD-10-CM | POA: Diagnosis not present

## 2022-06-02 DIAGNOSIS — Z8619 Personal history of other infectious and parasitic diseases: Secondary | ICD-10-CM | POA: Diagnosis not present

## 2022-06-02 DIAGNOSIS — Z7984 Long term (current) use of oral hypoglycemic drugs: Secondary | ICD-10-CM | POA: Diagnosis not present

## 2022-06-02 DIAGNOSIS — Z4589 Encounter for adjustment and management of other implanted devices: Secondary | ICD-10-CM | POA: Diagnosis not present

## 2022-06-02 DIAGNOSIS — Z9089 Acquired absence of other organs: Secondary | ICD-10-CM | POA: Diagnosis not present

## 2022-06-02 DIAGNOSIS — H90A32 Mixed conductive and sensorineural hearing loss, unilateral, left ear with restricted hearing on the contralateral side: Secondary | ICD-10-CM | POA: Diagnosis not present

## 2022-06-02 DIAGNOSIS — Z9889 Other specified postprocedural states: Secondary | ICD-10-CM | POA: Diagnosis not present

## 2022-06-02 DIAGNOSIS — H90A21 Sensorineural hearing loss, unilateral, right ear, with restricted hearing on the contralateral side: Secondary | ICD-10-CM | POA: Diagnosis not present

## 2022-06-02 DIAGNOSIS — B9689 Other specified bacterial agents as the cause of diseases classified elsewhere: Secondary | ICD-10-CM | POA: Diagnosis not present

## 2022-06-02 DIAGNOSIS — H7312 Chronic myringitis, left ear: Secondary | ICD-10-CM | POA: Diagnosis not present

## 2022-06-02 DIAGNOSIS — Z9622 Myringotomy tube(s) status: Secondary | ICD-10-CM | POA: Diagnosis not present

## 2022-06-23 DIAGNOSIS — Z881 Allergy status to other antibiotic agents status: Secondary | ICD-10-CM | POA: Diagnosis not present

## 2022-06-23 DIAGNOSIS — Z886 Allergy status to analgesic agent status: Secondary | ICD-10-CM | POA: Diagnosis not present

## 2022-06-23 DIAGNOSIS — H90A21 Sensorineural hearing loss, unilateral, right ear, with restricted hearing on the contralateral side: Secondary | ICD-10-CM | POA: Diagnosis not present

## 2022-06-23 DIAGNOSIS — H9113 Presbycusis, bilateral: Secondary | ICD-10-CM | POA: Diagnosis not present

## 2022-06-23 DIAGNOSIS — Z885 Allergy status to narcotic agent status: Secondary | ICD-10-CM | POA: Diagnosis not present

## 2022-06-23 DIAGNOSIS — Z4589 Encounter for adjustment and management of other implanted devices: Secondary | ICD-10-CM | POA: Diagnosis not present

## 2022-06-23 DIAGNOSIS — Z882 Allergy status to sulfonamides status: Secondary | ICD-10-CM | POA: Diagnosis not present

## 2022-06-23 DIAGNOSIS — H6983 Other specified disorders of Eustachian tube, bilateral: Secondary | ICD-10-CM | POA: Diagnosis not present

## 2022-06-23 DIAGNOSIS — Z9889 Other specified postprocedural states: Secondary | ICD-10-CM | POA: Diagnosis not present

## 2022-06-23 DIAGNOSIS — Z888 Allergy status to other drugs, medicaments and biological substances status: Secondary | ICD-10-CM | POA: Diagnosis not present

## 2022-06-23 DIAGNOSIS — Z9089 Acquired absence of other organs: Secondary | ICD-10-CM | POA: Diagnosis not present

## 2022-06-23 DIAGNOSIS — H90A32 Mixed conductive and sensorineural hearing loss, unilateral, left ear with restricted hearing on the contralateral side: Secondary | ICD-10-CM | POA: Diagnosis not present

## 2022-06-28 ENCOUNTER — Encounter: Payer: Self-pay | Admitting: Cardiology

## 2022-06-28 DIAGNOSIS — I10 Essential (primary) hypertension: Secondary | ICD-10-CM

## 2022-06-28 MED ORDER — HYDRALAZINE HCL 50 MG PO TABS: 75.0000 mg | ORAL_TABLET | Freq: Three times a day (TID) | ORAL | 5 refills | Status: DC

## 2022-06-30 ENCOUNTER — Other Ambulatory Visit (HOSPITAL_BASED_OUTPATIENT_CLINIC_OR_DEPARTMENT_OTHER): Payer: Self-pay

## 2022-06-30 MED ORDER — AREXVY 120 MCG/0.5ML IM SUSR
INTRAMUSCULAR | 0 refills | Status: DC
  Filled 2022-06-30: qty 0.5, 1d supply, fill #0

## 2022-07-31 ENCOUNTER — Other Ambulatory Visit: Payer: Self-pay | Admitting: Neurology

## 2022-07-31 ENCOUNTER — Other Ambulatory Visit: Payer: Self-pay | Admitting: Family Medicine

## 2022-08-02 DIAGNOSIS — R3912 Poor urinary stream: Secondary | ICD-10-CM | POA: Diagnosis not present

## 2022-08-02 DIAGNOSIS — N401 Enlarged prostate with lower urinary tract symptoms: Secondary | ICD-10-CM | POA: Diagnosis not present

## 2022-08-11 ENCOUNTER — Other Ambulatory Visit (HOSPITAL_BASED_OUTPATIENT_CLINIC_OR_DEPARTMENT_OTHER): Payer: Self-pay

## 2022-08-11 DIAGNOSIS — Z23 Encounter for immunization: Secondary | ICD-10-CM | POA: Diagnosis not present

## 2022-08-11 MED ORDER — COMIRNATY 30 MCG/0.3ML IM SUSY
PREFILLED_SYRINGE | INTRAMUSCULAR | 0 refills | Status: DC
  Filled 2022-08-11: qty 0.3, 1d supply, fill #0

## 2022-08-25 DIAGNOSIS — Z23 Encounter for immunization: Secondary | ICD-10-CM | POA: Diagnosis not present

## 2022-09-19 DIAGNOSIS — N1831 Chronic kidney disease, stage 3a: Secondary | ICD-10-CM | POA: Diagnosis not present

## 2022-09-20 DIAGNOSIS — B353 Tinea pedis: Secondary | ICD-10-CM | POA: Diagnosis not present

## 2022-09-20 DIAGNOSIS — L111 Transient acantholytic dermatosis [Grover]: Secondary | ICD-10-CM | POA: Diagnosis not present

## 2022-09-20 DIAGNOSIS — L728 Other follicular cysts of the skin and subcutaneous tissue: Secondary | ICD-10-CM | POA: Diagnosis not present

## 2022-09-20 DIAGNOSIS — L578 Other skin changes due to chronic exposure to nonionizing radiation: Secondary | ICD-10-CM | POA: Diagnosis not present

## 2022-09-20 DIAGNOSIS — Z85820 Personal history of malignant melanoma of skin: Secondary | ICD-10-CM | POA: Diagnosis not present

## 2022-09-20 DIAGNOSIS — L738 Other specified follicular disorders: Secondary | ICD-10-CM | POA: Diagnosis not present

## 2022-09-20 DIAGNOSIS — L814 Other melanin hyperpigmentation: Secondary | ICD-10-CM | POA: Diagnosis not present

## 2022-09-20 DIAGNOSIS — L570 Actinic keratosis: Secondary | ICD-10-CM | POA: Diagnosis not present

## 2022-09-20 DIAGNOSIS — L821 Other seborrheic keratosis: Secondary | ICD-10-CM | POA: Diagnosis not present

## 2022-09-20 DIAGNOSIS — D225 Melanocytic nevi of trunk: Secondary | ICD-10-CM | POA: Diagnosis not present

## 2022-09-20 DIAGNOSIS — H52203 Unspecified astigmatism, bilateral: Secondary | ICD-10-CM | POA: Diagnosis not present

## 2022-09-20 DIAGNOSIS — E119 Type 2 diabetes mellitus without complications: Secondary | ICD-10-CM | POA: Diagnosis not present

## 2022-09-20 DIAGNOSIS — Z08 Encounter for follow-up examination after completed treatment for malignant neoplasm: Secondary | ICD-10-CM | POA: Diagnosis not present

## 2022-09-20 DIAGNOSIS — Z85828 Personal history of other malignant neoplasm of skin: Secondary | ICD-10-CM | POA: Diagnosis not present

## 2022-09-27 DIAGNOSIS — D631 Anemia in chronic kidney disease: Secondary | ICD-10-CM | POA: Diagnosis not present

## 2022-09-27 DIAGNOSIS — N2581 Secondary hyperparathyroidism of renal origin: Secondary | ICD-10-CM | POA: Diagnosis not present

## 2022-09-27 DIAGNOSIS — N1831 Chronic kidney disease, stage 3a: Secondary | ICD-10-CM | POA: Diagnosis not present

## 2022-09-27 DIAGNOSIS — I129 Hypertensive chronic kidney disease with stage 1 through stage 4 chronic kidney disease, or unspecified chronic kidney disease: Secondary | ICD-10-CM | POA: Diagnosis not present

## 2022-10-15 ENCOUNTER — Encounter: Payer: Self-pay | Admitting: Family Medicine

## 2022-10-15 DIAGNOSIS — E039 Hypothyroidism, unspecified: Secondary | ICD-10-CM

## 2022-10-18 MED ORDER — LEVOTHYROXINE SODIUM 112 MCG PO TABS: 112.0000 ug | ORAL_TABLET | Freq: Every day | ORAL | 0 refills | Status: DC

## 2022-10-26 ENCOUNTER — Other Ambulatory Visit: Payer: Self-pay | Admitting: Neurology

## 2022-10-26 ENCOUNTER — Other Ambulatory Visit: Payer: Self-pay | Admitting: Family Medicine

## 2022-10-26 DIAGNOSIS — E1169 Type 2 diabetes mellitus with other specified complication: Secondary | ICD-10-CM

## 2022-11-10 ENCOUNTER — Ambulatory Visit (INDEPENDENT_AMBULATORY_CARE_PROVIDER_SITE_OTHER): Payer: Medicare Other | Admitting: *Deleted

## 2022-11-10 ENCOUNTER — Encounter: Payer: Self-pay | Admitting: Family Medicine

## 2022-11-10 DIAGNOSIS — Z0000 Encounter for general adult medical examination without abnormal findings: Secondary | ICD-10-CM | POA: Diagnosis not present

## 2022-11-10 NOTE — Progress Notes (Addendum)
Subjective:   [name] is a 77 y.o. male who presents for Medicare Annual/Subsequent preventive examination.  I connected with  [name] on 11/10/22 by a audio enabled telemedicine application and verified that I am speaking with the correct person using two identifiers.  Patient Location: Home  Provider Location: Office/Clinic  I discussed the limitations of evaluation and management by telemedicine. The patient expressed understanding and agreed to proceed.   Review of Systems    Defer to PCP Cardiac Risk Factors include: advanced age (>29mn, >>24women);male gender;diabetes mellitus;dyslipidemia;hypertension     Objective:    There were no vitals filed for this visit. There is no height or weight on file to calculate BMI.     11/10/2022    2:57 PM 04/19/2022    1:32 PM 11/17/2021    8:38 PM 11/08/2021    2:30 PM 12/08/2020   10:18 AM 12/02/2020    2:17 PM 12/01/2020    7:08 AM  Advanced Directives  Does Patient Have a Medical Advance Directive? Yes Yes No Yes Yes Yes Yes  Type of AParamedicof AHazardLiving will HMcKinney AcresOut of facility DNR (pink MOST or yellow form);Living will  [place]Living will H[place]Living will HEstillLiving will HWoodlawnLiving will  Does patient want to make changes to medical advance directive? No - Patient declined    No - Patient declined No - Patient declined No - Patient declined  Copy of HBonduelin Chart? Yes - validated most recent copy scanned in chart (See row information)   Yes - validated most recent copy scanned in chart (See row information) Yes - validated most recent copy scanned in chart (See row information) Yes - validated most recent copy scanned in chart (See row information)   Would patient like information on creating a medical advance directive?   No - Patient declined         Current Medications (verified) Outpatient Encounter Medications as of 11/10/2022  Medication Sig   valsartan (DIOVAN) 160 MG tablet    aspirin 81 MG tablet Take 81 mg by mouth daily.   azelastine (ASTELIN) 0.1 % nasal spray Place 1 spray into both nostrils daily as needed for rhinitis. Use in each nostril as directed   Biotin 1000 MCG tablet Take 1,000 mcg by mouth 2 (two) times daily.   Boswellia Serrata (BOSWELLIA PO) Take 500 mg by mouth in the morning and at bedtime.   cholecalciferol (VITAMIN D) 25 MCG (1000 UNIT) tablet Take 3,000 Units by mouth 3 (three) times daily.   Cinnamon 500 MG capsule Take 1,000 mg by mouth 2 (two) times daily.   Coenzyme Q10 (COQ-10) 100 MG capsule Take 1 capsule (100 mg total) by mouth daily.   COVID-19 mRNA vaccine 2023-2024 (COMIRNATY) syringe Inject into the muscle.   Cyanocobalamin (VITAMIN B-12) 1000 MCG SUBL Take 1,000 mcg by mouth daily.   Diclofenac Sodium 3 % GEL Qid prn (Patient taking differently: Apply 1 application  topically daily as needed (Arthritis).)   ezetimibe (ZETIA) 10 MG tablet TAKE ONE TABLET BY MOUTH DAILY   Ginger, Zingiber officinalis, (GINGER ROOT) 550 MG CAPS Take 550 mg by mouth 2 (two) times daily.   hydrALAZINE (APRESOLINE) 50 MG tablet Take 1.5 tablets (75 mg total) by mouth 3 (three) times daily.   levothyroxine (SYNTHROID) 112 MCG tablet Take 1 tablet (112 mcg total) by mouth daily  before breakfast.   metFORMIN (GLUCOPHAGE) 500 MG tablet Take 1 tablet (500 mg total) by mouth 2 (two) times daily with a meal.   Multiple Vitamins-Minerals (THERAGRAN-M ADVANCED 50 PLUS PO) Take 1 tablet by mouth daily.   Nerve Stimulator (CEFALY KIT) DEVI Apply 1 application topically daily at 12 noon. Electrical migraine kit for prevention on treatment of migraine headache   pantoprazole (PROTONIX) 40 MG tablet TAKE ONE TABLET BY MOUTH DAILY   propranolol ER (INDERAL LA) 160 MG SR capsule TAKE ONE CAPSULE BY MOUTH DAILY   RSV vaccine recomb  adjuvanted (AREXVY) 120 MCG/0.5ML injection Inject into the muscle.   simvastatin (ZOCOR) 40 MG tablet TAKE ONE TABLET BY MOUTH AT BEDTIME   tamsulosin (FLOMAX) 0.4 MG CAPS capsule Take 0.4 mg by mouth in the morning and at bedtime.   topiramate (TOPAMAX) 50 MG tablet TAKE ONE TABLET BY MOUTH EVERY NIGHT AT BEDTIME   [DISCONTINUED] valsartan (DIOVAN) 80 MG tablet Take 1 tablet (80 mg total) by mouth daily.   No facility-administered encounter medications on file as of 11/10/2022.    Allergies (verified) Sulfa antibiotics, Sulfamethoxazole-trimethoprim, Sulfasalazine, Amlodipine, Codeine, Crestor [rosuvastatin calcium], Hydrocodone-acetaminophen, Nitrofurantoin, Other, and Rosuvastatin   History: Past Medical History:  Diagnosis Date   Abdominal bruit 07/22/2010   Acute sinusitis 03/26/2015   Acute suppr otitis media w spon rupt ear drum, recur, r ear 04/06/2017   Angina pectoris 12/11/2008   Benign essential tremor 08/29/2017   Bilateral sensorineural hearing loss 11/30/2015   CAD S/P percutaneous coronary angioplasty 12/11/2008   LAD PCI '03,  Cath 2010 showed 40% LAD- medical Rx Myoview March 2020 negative for ischemia but positive for ST depression- Imdur added.  Formatting of this note might be different from the original. LAD PCI '03,  Cath 2010 showed 40% LAD- medical Rx Myoview March 2020 negative for ischemia but positive for ST depression- Imdur added.  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 Echo May 2020-  EF 60-65% with mild LVH  Last Assessment & Plan:  Well controlled, no changes to meds. Encouraged heart healthy diet such as the DASH diet and exercise as tolerated.   Eustachian tube dysfunction, bilateral 11/30/2015   History of colonic polyps 07/12/2010   History of GI bleed 04/23/2020   History of left mastoidectomy 02/20/2020   History of placement of ear tubes    Hyperlipidemia associated with type 2 diabetes mellitus 07/10/2007   Hyperthyroidism 07/12/2010   Hypothyroidism 07/10/2007   Internal hemorrhoids    Intractable episodic cluster headache 08/29/2017   Major depressive disorder 07/10/2007   Mastoiditis of both sides 09/21/2016   Migraine headache    Mild neurocognitive disorder due to multiple etiologies 12/25/2020   Non-insulin dependent type 2 diabetes mellitus 07/06/2010   Osteoarthritis of multiple joints 07/10/2007   Perennial allergic rhinitis 09/22/2016   Postprocedural urinary retention    Presbycusis of both ears 01/23/2018   Skin cancer, basal cell    Thrombocytopenia    TIA (transient ischemic attack) 2006  per patient's report. He was never officially given this diagnosis   Upper airway cough syndrome 09/25/2017   Past Surgical History:  Procedure Laterality Date   COLONOSCOPY  2011   coronary artery disease status post placement     of drug-eluting stent in the LAD in 2003,eEF 65% then   [place] N/A 12/08/2020   Procedure: CLOSURE LEFT POST AURICULAR SCALP WOUND;  Surgeon: Irene Limbo, MD;  Location: [facility];  Service: Plastics;  Laterality: N/A;   esophogeal dilation     fatty tissue removed     from neck 2003   HERNIA REPAIR  2007   mastoidectomy with tympanoplasty     MELANOMA EXCISION Left 11/17/2020   Procedure: WIDE LOCAL EXCISION, ADVANCED FLAP CLOSURE LEFT POSTERIOR EAR MELANOMA;  Surgeon: Stark Klein, MD;  Location: [facility];  Service: General;  Laterality: Left;   MELANOMA EXCISION  Left 12/01/2020   Procedure: RE-EXCISION LEFT POSTERIOR AURICULAR MELANOMA WITH TEMPORARY CLOSURE;  Surgeon: Stark Klein, MD;  Location: [facility];  Service: General;  Laterality: Left;  60 MIN TOTAL   POLYPECTOMY  2011   +TA   right toe bone spur surgery     SKIN FULL THICKNESS GRAFT Left 12/08/2020   Procedure: Full thickness skin graft from left upper arm;  Surgeon: Irene Limbo, MD;  Location: [facility];  Service: Plastics;  Laterality: Left;   THYROIDECTOMY, PARTIAL  07/2002   right thyroid   TONSILLECTOMY     UPPER GI ENDOSCOPY     Family History  Problem Relation Age of Onset   Heart disease Mother    Alzheimer's disease Mother    Heart disease Father        triple by pass   Hypertension Brother        Prostate Cancer   Prostate cancer Brother    Hypertension Brother    Hypertension Brother    COPD Brother    Colon cancer Neg Hx    Colon polyps Neg Hx    Rectal cancer Neg Hx    Stomach cancer Neg Hx    Esophageal cancer Neg Hx    Social History   Socioeconomic History   Marital status: Married    Spouse name: Charleen   Number of children: 0   Years of education: 14   Highest education level: Associate degree: occupational, Hotel manager, or vocational program  Occupational History   Occupation: retired    Fish farm manager: RETIRED    Comment: accountant  Tobacco Use   Smoking status: Former    Years: 5.00    Types: Cigarettes    Quit date: 10/10/1965    Years since quitting: 50.1   Smokeless tobacco: Never  Vaping Use   Vaping Use: Never used  Substance and Sexual Activity   Alcohol use: No   Drug use: No   Sexual activity: Yes    Partners: Female  Other Topics Concern   Not on file  Social History Narrative   Pt lives with spouse at private home in 2 story home- he has no children- right handed- he drinks coffee daily, tea sometimes, soda not all the time. Exercise-- treadmill, stationary bike   Right handed   Social Determinants of Health    Financial Resource Strain: Low Risk  (11/08/2021)   Overall Financial Resource Strain (CARDIA)    Difficulty of Paying Living Expenses: Not hard at all  Food Insecurity: No Food Insecurity (11/10/2022)   Hunger Vital Sign  Worried About Charity fundraiser in the Last Year: Never true    [place] in the Last Year: Never true  Transportation Needs: No Transportation Needs (11/10/2022)   PRAPARE - Hydrologist (Medical): No    Lack of Transportation (Non-Medical): No  Physical Activity: Sufficiently Active (11/08/2021)   Exercise Vital Sign    Days of Exercise per Week: 7 days    Minutes of Exercise per Session: 30 min  Stress: No Stress Concern Present (11/08/2021)   [place]    Feeling of Stress : Not at all  Social Connections: Moderately Integrated (11/08/2021)   Social Connection and Isolation Panel [NHANES]    Frequency of Communication with Friends and Family: Three times a week    Frequency of Social Gatherings with Friends and Family: More than three times a week    Attends Religious Services: More than 4 times per year    Active Member of [employer] or Organizations: No    Attends Music therapist: Never    Marital Status: Married    Tobacco Counseling Counseling given: Not Answered   Clinical Intake:  Pre-visit preparation completed: Yes  Pain : No/denies pain  How often do you need to have someone help you when you read instructions, pamphlets, or other written materials from your doctor or pharmacy?: 1 - Never  Diabetic? Nutrition Risk Assessment:  Has the patient had any N/V/D within the last 2 months?  No  Does the patient have any non-healing wounds?  No  Has the patient had any unintentional weight loss or weight gain?  No   Diabetes:  Is the patient diabetic?  Yes  If diabetic, was a CBG obtained today?  No  Did the patient bring in their  glucometer from home?  No  How often do you monitor your CBG's? never.   Financial Strains and Diabetes Management:  Are you having any financial strains with the device, your supplies or your medication? No .  Does the patient want to be seen by Chronic Care Management for management of their diabetes?  No  Would the patient like to be referred to a Nutritionist or for Diabetic Management?  No   Diabetic Exams:  Diabetic Eye Exam: Completed 09/20/22 Diabetic Foot Exam: Overdue, Pt has been advised about the importance in completing this exam. Pt is scheduled for diabetic foot exam on N/a.  Interpreter Needed?: No  Information entered by :: Beatris Ship, [place]   Activities of Daily Living    11/10/2022    2:58 PM 11/18/2021    3:00 PM  In your present state of health, do you have any difficulty performing the following activities:  Hearing? 1 1  Vision? 0 0  Difficulty concentrating or making decisions? 0 0  Walking or climbing stairs? 1 0  Comment sometimes   Dressing or bathing? 0 0  Doing errands, shopping? 0 0  Preparing Food and eating ? N   Using the Toilet? N   In the past six months, have you accidently leaked urine? Y   Comment sometimes   Do you have problems with loss of bowel control? N   Managing your Medications? N   Managing your Finances? N   Housekeeping or managing your Housekeeping? N     Patient Care Team: Carollee Herter, Alferd Apa, DO as PCP - General Stanford Breed Denice Bors, MD as PCP - Cardiology (Cardiology) [place],  Denice Bors, MD as Consulting Physician (Cardiology) Raynelle Bring, MD as Consulting Physician (Urology) Melissa Montane, MD as Consulting Physician (Otolaryngology) Ladene Artist, MD as Consulting Physician (Gastroenterology) Luberta Mutter, MD as Consulting Physician (Ophthalmology) Pieter Partridge, DO as Consulting Physician (Neurology) Macario Carls, MD as Referring Physician (Dermatology) Earlie Server, MD as Consulting Physician  (Orthopedic Surgery) Almedia Balls, MD as Referring Physician (Orthopedic Surgery)  Indicate any recent Medical Services you may have received from other than [facility] providers in the past year (date may be approximate).     Assessment:   This is a routine wellness examination for [place].  Hearing/Vision screen No results found.  Dietary issues and exercise activities discussed: Current Exercise Habits: Home exercise routine, Type of exercise: stretching;Other - see comments (stretches for back given by ortho provider), Time (Minutes): 30, Frequency (Times/Week): 7, Weekly Exercise (Minutes/Week): 210, Intensity: Mild, Exercise limited by: orthopedic condition(s)   Goals Addressed   None    Depression Screen    11/10/2022    2:49 PM 12/29/2021    1:25 PM 11/08/2021    2:37 PM 11/05/2020   12:42 PM 10/06/2016    8:58 AM 09/24/2015   11:07 AM 09/09/2014    9:28 AM  PHQ 2/9 Scores  PHQ - 2 Score 0 0 1 0 6 0 0  PHQ- 9 Score     9      Fall Risk    11/10/2022    2:58 PM 04/19/2022    1:32 PM 12/29/2021    1:25 PM 11/08/2021    2:35 PM 11/05/2020   12:41 PM  Fall Risk   Falls in the past year? 0 0 0 0 0  Number falls in past yr: 0 0 0 0 0  Injury with Fall? 0 0 0 0 0  Risk for fall due to : No Fall Risks      Follow up Falls evaluation completed   Falls prevention discussed Falls prevention discussed    FALL RISK PREVENTION PERTAINING TO THE HOME:  Any stairs in or around the home? Yes  If so, are there any without handrails? No  Home free of loose throw rugs in walkways, pet beds, electrical cords, etc? Yes  Adequate lighting in your home to reduce risk of falls? Yes   ASSISTIVE DEVICES UTILIZED TO PREVENT FALLS:  Life alert? No  Use of a cane, walker or w/c? No  Grab bars in the bathroom? No  Shower chair or bench in shower? Yes  Elevated toilet seat or a handicapped toilet?  Comfort height  TIMED UP AND GO:  Was the test performed?  No, audio visit .    Cognitive  Function:    10/06/2016    9:42 AM  MMSE - Mini Mental State Exam  Orientation to time 5  Orientation to Place 5  Registration 3  Attention/ Calculation 5  Recall 3  Language- name 2 objects 2  Language- repeat 1  Language- follow 3 step command 3  Language- read & follow direction 1  Write a sentence 1  Copy design 1  Total score 30        11/10/2022    3:11 PM 11/05/2020    1:02 PM  6CIT Screen  What Year? 0 points 0 points  What month? 0 points 0 points  What time? 0 points 0 points  Count back from 20 0 points 0 points  Months in reverse 0 points 0 points  Repeat phrase 0 points  0 points  Total Score 0 points 0 points    Immunizations Immunization History  Administered Date(s) Administered   COVID-19, mRNA, vaccine(Comirnaty)12 years and older 08/11/2022   Fluad Quad(high Dose 65+) 06/30/2019   Influenza Split 07/26/2011   Influenza Whole 08/30/2007, 07/08/2009, 07/06/2010   Influenza, High Dose Seasonal PF 07/11/2014, 10/06/2016, 07/23/2017   Influenza,inj,Quad PF,6+ Mos 07/05/2013   Influenza-Unspecified 08/19/2015, 08/02/2018, 08/06/2020, 08/02/2021, 08/25/2022   PFIZER(Purple Top)SARS-COV-2 Vaccination 10/30/2019, 11/20/2019, 07/17/2020, 01/08/2021   Pfizer Covid-19 Vaccine Bivalent Booster 43yr & up 07/26/2021   Pneumococcal Conjugate-13 12/17/2013   Pneumococcal Polysaccharide-23 04/23/2003, 07/08/2009, 01/14/2020   Respiratory Syncytial Virus Vaccine,Recomb Aduvanted(Arexvy) 06/30/2022   Td 02/19/2003, 05/13/2021   Tdap 09/19/2013   Zoster Recombinat (Shingrix) 07/25/2017, 09/26/2017   Zoster, Live 02/21/2007    TDAP status: Up to date  Flu Vaccine status: Up to date  Pneumococcal vaccine status: Up to date  Covid-19 vaccine status: Information provided on how to obtain vaccines.   Qualifies for Shingles Vaccine? Yes   Zostavax completed Yes   Shingrix Completed?: Yes  Screening Tests Health Maintenance  Topic Date Due   FOOT EXAM   05/13/2022   COVID-19 Vaccine (7 - 2023-24 season) 10/06/2022   Medicare Annual Wellness (AWV)  11/08/2022   HEMOGLOBIN A1C  11/23/2022   Diabetic kidney evaluation - eGFR measurement  05/24/2023   Diabetic kidney evaluation - Urine ACR  05/24/2023   OPHTHALMOLOGY EXAM  09/21/2023   COLONOSCOPY (Pts 45-494yrInsurance coverage will need to be confirmed)  07/08/2024   DTaP/Tdap/Td (4 - Td or Tdap) 05/14/2031   Pneumonia Vaccine 6545Years old  Completed   INFLUENZA VACCINE  Completed   Hepatitis C Screening  Completed   Zoster Vaccines- Shingrix  Completed   HPV VACCINES  Aged Out    Health Maintenance  Health Maintenance Due  Topic Date Due   FOOT EXAM  05/13/2022   COVID-19 Vaccine (7 - 2023-24 season) 10/06/2022   Medicare Annual Wellness (AWV)  11/08/2022    Colorectal cancer screening: Type of screening: Colonoscopy. Completed 07/09/19. Repeat every 5 years  Lung Cancer Screening: (Low Dose CT Chest recommended if Age 79-80ears, 30 pack-year currently smoking OR have quit w/in 15years.) does not qualify.   Additional Screening:  Hepatitis C Screening: does qualify; Completed 09/24/15  Vision Screening: Recommended annual ophthalmology exams for early detection of glaucoma and other disorders of the eye. Is the patient up to date with their annual eye exam?  Yes  Who is the provider or what is the name of the office in which the patient attends annual eye exams[facility] If pt is not established with a provider, would they like to be referred to a provider to establish care? No .   Dental Screening: Recommended annual dental exams for proper oral hygiene  Community Resource Referral / Chronic Care Management: CRR required this visit?  No   CCM required this visit?  No      Plan:     I have personally reviewed and noted the following in the patient's chart:   Medical and social history Use of alcohol, tobacco or illicit drugs  Current  medications and supplements including opioid prescriptions. Patient is not currently taking opioid prescriptions. Functional ability and status Nutritional status Physical activity Advanced directives List of other physicians Hospitalizations, surgeries, and ER visits in previous 12 months Vitals Screenings to include cognitive, depression, and falls Referrals and appointments  In addition, I have reviewed and discussed with patient  certain preventive protocols, quality metrics, and best practice recommendations. A written personalized care plan for preventive services as well as general preventive health recommendations were provided to patient.   Due to this being a telephonic visit, the after visit summary with patients personalized plan was offered to patient via mail or my-chart. Patient would like to access on my-chart.   Beatris Ship, [place]   11/10/2022   Nurse Notes: None  Medical screening examination/treatment/procedure(s) were performed by non-physician practitioner and as supervising provider I was immediately available for consultation/collaboration.  I agree with above. Marrian Salvage, FNP

## 2022-11-10 NOTE — Patient Instructions (Signed)
[name] , Thank you for taking time to come for your Medicare Wellness Visit. I appreciate your ongoing commitment to your health goals. Please review the following plan we discussed and let me know if I can assist you in the future.   These are the goals we discussed:  Goals      Patient receives support from spouse. Client wants to maintain current activity level     Care Coordination Interventions:  Active listening / Reflection utilized  Informed [name], spouse of client, about Care coordination support services Discussed client needs with Ronnie Derby Discussed RN support, LCSW support and Pharmacy support with Care Coordination Services.  Charlene said she was glad to receive information and would keep information if client needed support from this program in the future      Patient Stated     Maintain current healthy lifestyle        This is a list of the screening recommended for you and due dates:  Health Maintenance  Topic Date Due   Complete foot exam   05/13/2022   COVID-19 Vaccine (7 - 2023-24 season) 10/06/2022   Hemoglobin A1C  11/23/2022   Yearly kidney function blood test for diabetes  05/24/2023   Yearly kidney health urinalysis for diabetes  05/24/2023   Eye exam for diabetics  09/21/2023   Medicare Annual Wellness Visit  11/11/2023   Colon Cancer Screening  07/08/2024   DTaP/Tdap/Td vaccine (4 - Td or Tdap) 05/14/2031   Pneumonia Vaccine  Completed   Flu Shot  Completed   Hepatitis C Screening: USPSTF Recommendation to screen - Ages 25-79 yo.  Completed   Zoster (Shingles) Vaccine  Completed   HPV Vaccine  Aged Out     Next appointment: Follow up in one year for your annual wellness visit.   Preventive Care 79 Years and Older, Male Preventive care refers to lifestyle choices and visits with your health care provider that can promote health and wellness. What does preventive care include? A yearly physical exam. This is also called an annual  well check. Dental exams once or twice a year. Routine eye exams. Ask your health care provider how often you should have your eyes checked. Personal lifestyle choices, including: Daily care of your teeth and gums. Regular physical activity. Eating a healthy diet. Avoiding tobacco and drug use. Limiting alcohol use. Practicing safe sex. Taking low doses of aspirin every day. Taking vitamin and mineral supplements as recommended by your health care provider. What happens during an annual well check? The services and screenings done by your health care provider during your annual well check will depend on your age, overall health, lifestyle risk factors, and family history of disease. Counseling  Your health care provider may ask you questions about your: Alcohol use. Tobacco use. Drug use. Emotional well-being. Home and relationship well-being. Sexual activity. Eating habits. History of falls. Memory and ability to understand (cognition). Work and work Statistician. Screening  You may have the following tests or measurements: Height, weight, and BMI. Blood pressure. Lipid and cholesterol levels. These may be checked every 5 years, or more frequently if you are over 67 years old. Skin check. Lung cancer screening. You may have this screening every year starting at age 65 if you have a 30-pack-year history of smoking and currently smoke or have quit within the past 15 years. Fecal occult blood test (FOBT) of the stool. You may have this test every year starting at age 38. Flexible sigmoidoscopy  or colonoscopy. You may have a sigmoidoscopy every 5 years or a colonoscopy every 10 years starting at age 56. Prostate cancer screening. Recommendations will vary depending on your family history and other risks. Hepatitis C blood test. Hepatitis B blood test. Sexually transmitted disease (STD) testing. Diabetes screening. This is done by checking your blood sugar (glucose) after you have  not eaten for a while (fasting). You may have this done every 1-3 years. Abdominal aortic aneurysm (AAA) screening. You may need this if you are a current or former smoker. Osteoporosis. You may be screened starting at age 64 if you are at high risk. Talk with your health care provider about your test results, treatment options, and if necessary, the need for more tests. Vaccines  Your health care provider may recommend certain vaccines, such as: Influenza vaccine. This is recommended every year. Tetanus, diphtheria, and acellular pertussis (Tdap, Td) vaccine. You may need a Td booster every 10 years. Zoster vaccine. You may need this after age 92. Pneumococcal 13-valent conjugate (PCV13) vaccine. One dose is recommended after age 79. Pneumococcal polysaccharide (PPSV23) vaccine. One dose is recommended after age 79. Talk to your health care provider about which screenings and vaccines you need and how often you need them. This information is not intended to replace advice given to you by your health care provider. Make sure you discuss any questions you have with your health care provider. Document Released: 10/23/2015 Document Revised: 06/15/2016 Document Reviewed: 07/28/2015 Elsevier Interactive Patient Education  2017 [place] Prevention in the Home Falls can cause injuries. They can happen to people of all ages. There are many things you can do to make your home safe and to help prevent falls. What can I do on the outside of my home? Regularly fix the edges of walkways and driveways and fix any cracks. Remove anything that might make you trip as you walk through a door, such as a raised step or threshold. Trim any bushes or trees on the path to your home. Use bright outdoor lighting. Clear any walking paths of anything that might make someone trip, such as rocks or tools. Regularly check to see if handrails are loose or broken. Make sure that both sides of any steps have  handrails. Any raised decks and porches should have guardrails on the edges. Have any leaves, snow, or ice cleared regularly. Use sand or salt on walking paths during winter. Clean up any spills in your garage right away. This includes oil or grease spills. What can I do in the bathroom? Use night lights. Install grab bars by the toilet and in the tub and shower. Do not use towel bars as grab bars. Use non-skid mats or decals in the tub or shower. If you need to sit down in the shower, use a plastic, non-slip stool. Keep the floor dry. Clean up any water that spills on the floor as soon as it happens. Remove soap buildup in the tub or shower regularly. Attach bath mats securely with double-sided non-slip rug tape. Do not have throw rugs and other things on the floor that can make you trip. What can I do in the bedroom? Use night lights. Make sure that you have a light by your bed that is easy to reach. Do not use any sheets or blankets that are too big for your bed. They should not hang down onto the floor. Have a firm chair that has side arms. You can use this for support  while you get dressed. Do not have throw rugs and other things on the floor that can make you trip. What can I do in the kitchen? Clean up any spills right away. Avoid walking on wet floors. Keep items that you use a lot in easy-to-reach places. If you need to reach something above you, use a strong step stool that has a grab bar. Keep electrical cords out of the way. Do not use floor polish or wax that makes floors slippery. If you must use wax, use non-skid floor wax. Do not have throw rugs and other things on the floor that can make you trip. What can I do with my stairs? Do not leave any items on the stairs. Make sure that there are handrails on both sides of the stairs and use them. Fix handrails that are broken or loose. Make sure that handrails are as long as the stairways. Check any carpeting to make sure  that it is firmly attached to the stairs. Fix any carpet that is loose or worn. Avoid having throw rugs at the top or bottom of the stairs. If you do have throw rugs, attach them to the floor with carpet tape. Make sure that you have a light switch at the top of the stairs and the bottom of the stairs. If you do not have them, ask someone to add them for you. What else can I do to help prevent falls? Wear shoes that: Do not have high heels. Have rubber bottoms. Are comfortable and fit you well. Are closed at the toe. Do not wear sandals. If you use a stepladder: Make sure that it is fully opened. Do not climb a closed stepladder. Make sure that both sides of the stepladder are locked into place. Ask someone to hold it for you, if possible. Clearly mark and make sure that you can see: Any grab bars or handrails. First and last steps. Where the edge of each step is. Use tools that help you move around (mobility aids) if they are needed. These include: Canes. Walkers. Scooters. Crutches. Turn on the lights when you go into a dark area. Replace any light bulbs as soon as they burn out. Set up your furniture so you have a clear path. Avoid moving your furniture around. If any of your floors are uneven, fix them. If there are any pets around you, be aware of where they are. Review your medicines with your doctor. Some medicines can make you feel dizzy. This can increase your chance of falling. Ask your doctor what other things that you can do to help prevent falls. This information is not intended to replace advice given to you by your health care provider. Make sure you discuss any questions you have with your health care provider. Document Released: 07/23/2009 Document Revised: 03/03/2016 Document Reviewed: 10/31/2014 Elsevier Interactive Patient Education  2017 [employer].

## 2022-11-14 DIAGNOSIS — L578 Other skin changes due to chronic exposure to nonionizing radiation: Secondary | ICD-10-CM | POA: Diagnosis not present

## 2022-11-14 DIAGNOSIS — L570 Actinic keratosis: Secondary | ICD-10-CM | POA: Diagnosis not present

## 2022-11-14 DIAGNOSIS — B353 Tinea pedis: Secondary | ICD-10-CM | POA: Diagnosis not present

## 2022-11-18 ENCOUNTER — Encounter: Payer: Self-pay | Admitting: Nurse Practitioner

## 2022-11-18 ENCOUNTER — Other Ambulatory Visit: Payer: Self-pay

## 2022-11-18 ENCOUNTER — Ambulatory Visit: Payer: Medicare Other | Attending: Nurse Practitioner | Admitting: Nurse Practitioner

## 2022-11-18 VITALS — BP 158/88 | HR 62 | Ht 69.0 in | Wt 164.0 lb

## 2022-11-18 DIAGNOSIS — Z8669 Personal history of other diseases of the nervous system and sense organs: Secondary | ICD-10-CM

## 2022-11-18 DIAGNOSIS — I6523 Occlusion and stenosis of bilateral carotid arteries: Secondary | ICD-10-CM

## 2022-11-18 DIAGNOSIS — I2510 Atherosclerotic heart disease of native coronary artery without angina pectoris: Secondary | ICD-10-CM

## 2022-11-18 DIAGNOSIS — E785 Hyperlipidemia, unspecified: Secondary | ICD-10-CM

## 2022-11-18 DIAGNOSIS — I10 Essential (primary) hypertension: Secondary | ICD-10-CM

## 2022-11-18 DIAGNOSIS — N1831 Chronic kidney disease, stage 3a: Secondary | ICD-10-CM

## 2022-11-18 MED ORDER — OLMESARTAN MEDOXOMIL 40 MG PO TABS: 40.0000 mg | ORAL_TABLET | Freq: Every day | ORAL | 3 refills | Status: DC

## 2022-11-18 NOTE — Patient Instructions (Signed)
Medication Instructions:  STOP Valsartan as directed Start Olmesartan 40 mg daily   *If you need a refill on your cardiac medications before your next appointment, please call your pharmacy*   Lab Work: Your physician recommends that you return for lab work in 2 weeks. BMET  If you have labs (blood work) drawn today and your tests are completely normal, you will receive your results only by: [place] (if you have MyChart) OR A paper copy in the mail If you have any lab test that is abnormal or we need to change your treatment, we will call you to review the results.   Testing/Procedures: NONE ordered at this time of appointment     Follow-Up: At [facility], you and your health needs are our priority.  As part of our continuing mission to provide you with exceptional heart care, we have created designated Provider Care Teams.  These Care Teams include your primary Cardiologist (physician) and Advanced Practice Providers (APPs -  Physician Assistants and Nurse Practitioners) who all work together to provide you with the care you need, when you need it.  We recommend signing up for the patient portal called "MyChart".  Sign up information is provided on this After Visit Summary.  MyChart is used to connect with patients for Virtual Visits (Telemedicine).  Patients are able to view lab/test results, encounter notes, upcoming appointments, etc.  Non-urgent messages can be sent to your provider as well.   To learn more about what you can do with MyChart, go to [email or web address].    Your next appointment:   1 month(s)  Provider:   Vena Austria      Other Instructions

## 2022-11-18 NOTE — Progress Notes (Unsigned)
Office Visit    Patient Name: [name] Date of Encounter: 11/18/2022  Primary Care Provider:  Carollee Herter, Alferd Apa, DO Primary Cardiologist:  Kirk Ruths, MD  Chief Complaint    77 year old male with a history of CAD, hypertension, hyperlipidemia, bilateral carotid artery stenosis, bilateral sensorineural hearing loss, anemia and type 2 diabetes who presents for follow-up related to CAD and hypertension.    Past Medical History    Past Medical History:  Diagnosis Date   Abdominal bruit 07/22/2010   Acute sinusitis 03/26/2015   Acute suppr otitis media w spon rupt ear drum, recur, r ear 04/06/2017   Angina pectoris 12/11/2008   Benign essential tremor 08/29/2017   Bilateral sensorineural hearing loss 11/30/2015   CAD S/P percutaneous coronary angioplasty 12/11/2008   LAD PCI '03,  Cath 2010 showed 40% LAD- medical Rx Myoview March 2020 negative for ischemia but positive for ST depression- Imdur added.  Formatting of this note might be different from the original. LAD PCI '03,  Cath 2010 showed 40% LAD- medical Rx Myoview March 2020 negative for ischemia but positive for ST depression- Imdur added.  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 Echo May 2020- EF 60-65% with mild LVH  Last Assessment & Plan:  Well controlled, no changes to meds. Encouraged heart healthy diet such as the DASH diet and exercise as tolerated.   Eustachian tube dysfunction, bilateral 11/30/2015   History of colonic polyps  07/12/2010   History of GI bleed 04/23/2020   History of left mastoidectomy 02/20/2020   History of placement of ear tubes    Hyperlipidemia associated with type 2 diabetes mellitus 07/10/2007   Hyperthyroidism 07/12/2010   Hypothyroidism 07/10/2007   Internal hemorrhoids    Intractable episodic cluster headache 08/29/2017   Major depressive disorder 07/10/2007   Mastoiditis of both sides 09/21/2016   Migraine headache    Mild neurocognitive disorder due to multiple etiologies 12/25/2020   Non-insulin dependent type 2 diabetes mellitus 07/06/2010   Osteoarthritis of multiple joints 07/10/2007   Perennial allergic rhinitis 09/22/2016   Postprocedural urinary retention    Presbycusis of both ears 01/23/2018   Skin cancer, basal cell    Thrombocytopenia    TIA (transient ischemic attack) 2006   per patient's report. He was never officially given this diagnosis   Upper airway cough syndrome 09/25/2017   Past Surgical History:  Procedure Laterality Date   COLONOSCOPY  2011   coronary artery disease status post placement     of drug-eluting stent in the LAD in 2003,eEF 65% then   [place] N/A 12/08/2020   Procedure: CLOSURE LEFT POST AURICULAR SCALP WOUND;  Surgeon: Irene Limbo, MD;  Location: [facility];  Service: Plastics;  Laterality: N/A;   esophogeal dilation     fatty tissue removed     from neck 2003   HERNIA REPAIR  2007   mastoidectomy with tympanoplasty     MELANOMA EXCISION Left 11/17/2020   Procedure: WIDE LOCAL EXCISION,  ADVANCED FLAP CLOSURE LEFT POSTERIOR EAR MELANOMA;  Surgeon: Stark Klein, MD;  Location: [facility];  Service: General;  Laterality: Left;   MELANOMA EXCISION Left 12/01/2020   Procedure: RE-EXCISION LEFT POSTERIOR AURICULAR MELANOMA WITH TEMPORARY CLOSURE;  Surgeon: Stark Klein, MD;  Location: [facility];  Service: General;  Laterality: Left;  60 MIN TOTAL   POLYPECTOMY  2011   +TA   right toe bone  spur surgery     SKIN FULL THICKNESS GRAFT Left 12/08/2020   Procedure: Full thickness skin graft from left upper arm;  Surgeon: Irene Limbo, MD;  Location: [facility];  Service: Plastics;  Laterality: Left;   THYROIDECTOMY, PARTIAL  07/2002   right thyroid   TONSILLECTOMY     UPPER GI ENDOSCOPY      Allergies  Allergies  Allergen Reactions   Crestor [Rosuvastatin Calcium] Other (See Comments)    Severe joint and muscle pain   Nitrofurantoin Swelling    Ankles/legs SOB   Rosuvastatin Other (See Comments)    REACTION: REALLY BAD JOINT/MUSCLE PAIN   Sulfa Antibiotics Other (See Comments)    Severe crippling  joint and muscle pain and redness   Sulfamethoxazole-Trimethoprim Other (See Comments)    Joint and muscle pain   Sulfasalazine Other (See Comments)    Joint and muscle pain   Amlodipine Swelling   Codeine Other (See Comments)    Unknown   Hydrocodone-Acetaminophen Other (See Comments)    To strong vicodin    Other     Apples and tomatoes...... Nausea and migraines.       Labs/Other Studies Reviewed    The following studies were reviewed today: Renal ultrasound 05/2022: Summary:  Renal:    Right: Abnormal right Resistive Index. Normal size right kidney.         Normal cortical thickness of right kidney. RRV flow present.         No evidence of right renal artery stenosis.  Left:  Abnormal left Resisitve Index. Normal size of left kidney.         Normal cortical thickness of the left kidney. LRV flow         present. No evidence of left renal artery stenosis.  Mesenteric:  Normal Celiac artery findings.    *See table(s) above for measurements and observations.    Diagnosing physician: Quay Burow MD    Electronically signed by Quay Burow MD on 05/20/2022 at 5:10:52 PM.   Carotid ultrasound 11/2021: Summary: Right Carotid: Velocities in the right ICA are consistent with a 1-39% stenosis.  Left Carotid: Velocities in the left ICA  are consistent with a 1-39% stenosis.  Vertebrals:  Bilateral vertebral arteries demonstrate antegrade flow. Subclavians: Normal flow hemodynamics were seen in bilateral subclavian              arteries.  *See table(s) above for measurements and observations.  cMRI 07/2019: IMPRESSION: 1. Basal anteroseptal midwall late gadolinium enhancement. This scar pattern can be seen with prior myocarditis. Sarcoidosis also on differential, would consider chest CT to evaluate for evidence of sarcoid   2. Asymmetric basal septal hypertrophy measuring up to 55m (lateral wall 653m, not meeting criteria for hypertrophic cardiomyopathy (<1520m Amyloidosis can also present with asymmetric hypertrophy but LGE pattern not typical of amyloid and extracellular volume is not in amyloid range   3.  Normal LV size and systolic function (EF 61%[id number] 4.  Normal RV size and systolic function (EF 64%[id number] Labs: 05/23/2022: ALT  16; BUN 26; Creatinine, Ser 1.50; Hemoglobin 15.4; Platelets 132.0; Potassium 4.9; Sodium 141; TSH 1.37  Recent Lipid Panel    Component Value Date/Time   CHOL 92 05/23/2022 1556   TRIG 108.0 05/23/2022 1556   HDL 45.10 05/23/2022 1556   CHOLHDL 2 05/23/2022 1556   VLDL 21.6 05/23/2022 1556   LDLCALC 25 05/23/2022 1556   LDLCALC 37 10/09/2017 1149    History of Present Illness    77 year old male with the above past medical history including CAD, hypertension, hyperlipidemia, bilateral carotid artery stenosis, bilateral sensorineural hearing loss, anemia and type 2 diabetes.   Has a history of prior DES-LAD in May 2003. Cardiac catheterization in 2010 showed 30-40% LAD stenosis just distal to the stent, no other obstructive disease, EF 50%.  He has been managed medically. Exercise Myoview in March 2020 showed EF 56%, significant ST changes but normal perfusion. Echocardiogram in May 2020 showed EF 60 to 65%, impaired relaxation, mildly dilated left atrium. Cardiac MRI in  October 2020 showed basal anteroseptal enhancement which could be seen with prior myocarditis but also with sarcoid, not consistent with amyloid, EF 61%.  He has had chronic bilateral lower extremity edema thought to be related to anemia with high output, improved with Lasix. He was hospitalized in 11/2021 in the setting of hypertensive emergency. Carotid dopplers showed 1-39% BICA stenosis. He was discharged home in stable condition on 11/19/2021 with the addition of amlodipine to his BP medication regimen. Amlodipine was later switched to hydralazine in the setting of lower extremity swelling. He was transitioned from losartan to valsartan.  He was last seen in the office on 05/12/2022 and reported ongoing elevated BP.  Hydralazine was increased to 75 mg 3 times daily.  Renal ultrasound showed no evidence of renal artery stenosis.  He presents today for follow-up accompanied by his wife. Since his last visit he has done well from a cardiac standpoint though is BP remains elevated above goal.  Valsartan was increased by his nephrologist.  He has no real improvement in BP with increased valsartan or hydralazine.  Other than his ongoing elevated BP, he reports feeling well.  Home Medications    Current Outpatient Medications  Medication Sig Dispense Refill   aspirin 81 MG tablet Take 81 mg by mouth daily.     azelastine (ASTELIN) 0.1 % nasal spray Place 1 spray into both nostrils daily as needed for rhinitis. Use in each nostril as directed     Biotin 1000 MCG tablet Take 1,000 mcg by mouth in the morning, at noon, and at bedtime.     Boswellia Serrata (BOSWELLIA PO) Take 500 mg by mouth 3 (three) times daily.     cholecalciferol (VITAMIN D) 25 MCG (1000 UNIT) tablet Take 3,000 Units by mouth 3 (three) times daily.     Cinnamon 500 MG capsule Take 1,000 mg by mouth 2 (two) times daily.     Coenzyme Q10 (COQ-10) 100 MG capsule Take 1 capsule (100 mg total) by mouth daily.     Cyanocobalamin (VITAMIN B-12)  1000 MCG SUBL Take 1,000 mcg by mouth daily.     Diclofenac Sodium 3 % GEL Qid prn (Patient taking differently: Apply 1 application  topically daily as needed (Arthritis).) 100 g 3   ezetimibe (ZETIA) 10 MG tablet TAKE ONE TABLET BY MOUTH DAILY 90 tablet 3   Ginger, Zingiber officinalis, (GINGER ROOT) 550 MG CAPS Take 550 mg by mouth 2 (two) times daily.     hydrALAZINE (APRESOLINE) 50  MG tablet Take 1.5 tablets (75 mg total) by mouth 3 (three) times daily. 270 tablet 5   levothyroxine (SYNTHROID) 112 MCG tablet Take 1 tablet (112 mcg total) by mouth daily before breakfast. 90 tablet 0   metFORMIN (GLUCOPHAGE) 500 MG tablet Take 1 tablet (500 mg total) by mouth 2 (two) times daily with a meal. 180 tablet 3   Multiple Vitamins-Minerals (THERAGRAN-M ADVANCED 50 PLUS PO) Take 1 tablet by mouth daily.     Nerve Stimulator (CEFALY KIT) DEVI Apply 1 application topically daily at 12 noon. Electrical migraine kit for prevention on treatment of migraine headache     olmesartan (BENICAR) 40 MG tablet Take 1 tablet (40 mg total) by mouth daily. 90 tablet 3   pantoprazole (PROTONIX) 40 MG tablet TAKE ONE TABLET BY MOUTH DAILY 90 tablet 1   propranolol ER (INDERAL LA) 160 MG SR capsule TAKE ONE CAPSULE BY MOUTH DAILY 90 capsule 1   simvastatin (ZOCOR) 40 MG tablet TAKE ONE TABLET BY MOUTH AT BEDTIME 90 tablet 1   tamsulosin (FLOMAX) 0.4 MG CAPS capsule Take 0.4 mg by mouth daily. 30 capsule    topiramate (TOPAMAX) 50 MG tablet TAKE ONE TABLET BY MOUTH EVERY NIGHT AT BEDTIME 90 tablet 1   No current facility-administered medications for this visit.     Review of Systems   He denies chest pain, palpitations, dyspnea, pnd, orthopnea, n, v, dizziness, syncope, edema, weight gain, or early satiety. All other systems reviewed and are otherwise negative except as noted above.   Physical Exam    VS:  BP (!) 158/88   Pulse 62   Ht 5' 9"$  (1.753 m)   Wt 164 lb (74.4 kg)   SpO2 98%   BMI 24.22 kg/m  GEN:  Well nourished, well developed, in no acute distress. HEENT: normal. Neck: Supple, no JVD, carotid bruits, or masses. Cardiac: RRR, no murmurs, rubs, or gallops. No clubbing, cyanosis, edema.  Radials/DP/PT 2+ and equal bilaterally.  Respiratory:  Respirations regular and unlabored, clear to auscultation bilaterally. GI: Soft, nontender, nondistended, BS + x 4. MS: no deformity or atrophy. Skin: warm and dry, no rash. Neuro:  Strength and sensation are intact. Psych: Normal affect.  Accessory Clinical Findings    ECG personally reviewed by me today -sinus bradycardia, 58 bpm- no acute changes.   Lab Results  Component Value Date   WBC 6.6 05/23/2022   HGB 15.4 05/23/2022   HCT 45.6 05/23/2022   MCV 95.9 05/23/2022   PLT 132.0 (L) 05/23/2022   Lab Results  Component Value Date   CREATININE 1.50 05/23/2022   BUN 26 (H) 05/23/2022   NA 141 05/23/2022   K 4.9 05/23/2022   CL 106 05/23/2022   CO2 26 05/23/2022   Lab Results  Component Value Date   ALT 16 05/23/2022   AST 18 05/23/2022   ALKPHOS 57 05/23/2022   BILITOT 0.6 05/23/2022   Lab Results  Component Value Date   CHOL 92 05/23/2022   HDL 45.10 05/23/2022   LDLCALC 25 05/23/2022   TRIG 108.0 05/23/2022   CHOLHDL 2 05/23/2022    Lab Results  Component Value Date   HGBA1C 5.2 05/23/2022    Assessment & Plan    1. Hypertension: BP remains elevated despite titration of valsartan and hydralazine. Unfortunately, management options are limited given CKD, medication intolerances.  Recent renal ultrasound showed no evidence of renal artery stenosis.  Will stop valsartan and start olmesartan 40 mg daily.  Will check  BMET in 2 weeks.  I will refer him to our hypertension clinic Pharm.D. for additional recommendations given longstanding history of resistant hypertension. I will have him keep a BP log to brin with him to his next visit.   2. CAD: Cath in 2010 showed 30-40% LAD stenosis just distal to the stent, no other  obstructive disease, EF 50%, managed medically. Stable with no anginal symptoms. No indication for ischemic evaluation. Continue aspirin, hydralazine, olmesartan as above,  simvastatin, and Zetia.   3. Carotid artery stenosis: Carotid dopplers showed 1-39% BICA stenosis. Asymptomatic. Continue aspirin, statin as above.    4. CKD stage IIIa: Creatinine was 1.50 in 05/2022. Renal ultrasound on 01/12/2022 was unremarkable.  Following with nephrology.    5. H/o migraine: Continue propanolol for prophylaxis.    6. Hyperlipidemia: LDL was 25 in 05/2022.  Continue simvastatin, Zetia   7. Disposition: Follow-up with hypertension clinic pharmD, follow-up as scheduled with Dr. Stanford Breed in April 2024.   HYPERTENSION CONTROL Vitals:   11/18/22 1531 11/18/22 1544 11/18/22 1608  BP: (!) 166/74 (!) 170/87 (!) 158/88    The patient's blood pressure is elevated above target today.  In order to address the patient's elevated BP: Blood pressure will be monitored at home to determine if medication changes need to be made.; A new medication was prescribed today.; A referral to the [facility] will be placed.; Follow up with general cardiology has been recommended.     Lenna Sciara, NP 11/19/2022, 3:04 PM

## 2022-11-19 ENCOUNTER — Encounter: Payer: Self-pay | Admitting: Nurse Practitioner

## 2022-11-22 ENCOUNTER — Encounter: Payer: Self-pay | Admitting: Family Medicine

## 2022-11-22 ENCOUNTER — Ambulatory Visit (INDEPENDENT_AMBULATORY_CARE_PROVIDER_SITE_OTHER): Payer: Medicare Other | Admitting: Family Medicine

## 2022-11-22 VITALS — BP 136/78 | HR 60 | Temp 97.5°F | Resp 18 | Ht 69.0 in | Wt 161.2 lb

## 2022-11-22 DIAGNOSIS — I6523 Occlusion and stenosis of bilateral carotid arteries: Secondary | ICD-10-CM | POA: Diagnosis not present

## 2022-11-22 DIAGNOSIS — E1169 Type 2 diabetes mellitus with other specified complication: Secondary | ICD-10-CM

## 2022-11-22 DIAGNOSIS — N401 Enlarged prostate with lower urinary tract symptoms: Secondary | ICD-10-CM

## 2022-11-22 DIAGNOSIS — E039 Hypothyroidism, unspecified: Secondary | ICD-10-CM | POA: Diagnosis not present

## 2022-11-22 DIAGNOSIS — I10 Essential (primary) hypertension: Secondary | ICD-10-CM

## 2022-11-22 DIAGNOSIS — E1165 Type 2 diabetes mellitus with hyperglycemia: Secondary | ICD-10-CM

## 2022-11-22 DIAGNOSIS — R351 Nocturia: Secondary | ICD-10-CM | POA: Diagnosis not present

## 2022-11-22 DIAGNOSIS — E785 Hyperlipidemia, unspecified: Secondary | ICD-10-CM | POA: Diagnosis not present

## 2022-11-22 NOTE — Patient Instructions (Signed)
Cholesterol Content in Foods ?Cholesterol is a waxy, fat-like substance that helps to carry fat in the blood. The body needs cholesterol in small amounts, but too much cholesterol can cause damage to the arteries and heart. ?What foods have cholesterol? ? ?Cholesterol is found in animal-based foods, such as meat, seafood, and dairy. Generally, low-fat dairy and lean meats have less cholesterol than full-fat dairy and fatty meats. The milligrams of cholesterol per serving (mg per serving) of common cholesterol-containing foods are listed below. ?Meats and other proteins ?Egg -- one large whole egg has 186 mg. ?Veal shank -- 4 oz (113 g) has 141 mg. ?Lean ground turkey (93% lean) -- 4 oz (113 g) has 118 mg. ?Fat-trimmed lamb loin -- 4 oz (113 g) has 106 mg. ?Lean ground beef (90% lean) -- 4 oz (113 g) has 100 mg. ?Lobster -- 3.5 oz (99 g) has 90 mg. ?Pork loin chops -- 4 oz (113 g) has 86 mg. ?Canned salmon -- 3.5 oz (99 g) has 83 mg. ?Fat-trimmed beef top loin -- 4 oz (113 g) has 78 mg. ?Frankfurter -- 1 frank (3.5 oz or 99 g) has 77 mg. ?Crab -- 3.5 oz (99 g) has 71 mg. ?Roasted chicken without skin, white meat -- 4 oz (113 g) has 66 mg. ?Light bologna -- 2 oz (57 g) has 45 mg. ?Deli-cut turkey -- 2 oz (57 g) has 31 mg. ?Canned tuna -- 3.5 oz (99 g) has 31 mg. ?Bacon -- 1 oz (28 g) has 29 mg. ?Oysters and mussels (raw) -- 3.5 oz (99 g) has 25 mg. ?Mackerel -- 1 oz (28 g) has 22 mg. ?Trout -- 1 oz (28 g) has 20 mg. ?Pork sausage -- 1 link (1 oz or 28 g) has 17 mg. ?Salmon -- 1 oz (28 g) has 16 mg. ?Tilapia -- 1 oz (28 g) has 14 mg. ?Dairy ?Soft-serve ice cream -- ? cup (4 oz or 86 g) has 103 mg. ?Whole-milk yogurt -- 1 cup (8 oz or 245 g) has 29 mg. ?Cheddar cheese -- 1 oz (28 g) has 28 mg. ?American cheese -- 1 oz (28 g) has 28 mg. ?Whole milk -- 1 cup (8 oz or 250 mL) has 23 mg. ?2% milk -- 1 cup (8 oz or 250 mL) has 18 mg. ?Cream cheese -- 1 tablespoon (Tbsp) (14.5 g) has 15 mg. ?Cottage cheese -- ? cup (4 oz or  113 g) has 14 mg. ?Low-fat (1%) milk -- 1 cup (8 oz or 250 mL) has 10 mg. ?Sour cream -- 1 Tbsp (12 g) has 8.5 mg. ?Low-fat yogurt -- 1 cup (8 oz or 245 g) has 8 mg. ?Nonfat Greek yogurt -- 1 cup (8 oz or 228 g) has 7 mg. ?Half-and-half cream -- 1 Tbsp (15 mL) has 5 mg. ?Fats and oils ?Cod liver oil -- 1 tablespoon (Tbsp) (13.6 g) has 82 mg. ?Butter -- 1 Tbsp (14 g) has 15 mg. ?Lard -- 1 Tbsp (12.8 g) has 14 mg. ?Bacon grease -- 1 Tbsp (12.9 g) has 14 mg. ?Mayonnaise -- 1 Tbsp (13.8 g) has 5-10 mg. ?Margarine -- 1 Tbsp (14 g) has 3-10 mg. ?The items listed above may not be a complete list of foods with cholesterol. Exact amounts of cholesterol in these foods may vary depending on specific ingredients and brands. Contact a dietitian for more information. ?What foods do not have cholesterol? ?Most plant-based foods do not have cholesterol unless you combine them with a food that has   cholesterol. Foods without cholesterol include: ?Grains and cereals. ?Vegetables. ?Fruits. ?Vegetable oils, such as olive, canola, and sunflower oil. ?Legumes, such as peas, beans, and lentils. ?Nuts and seeds. ?Egg whites. ?The items listed above may not be a complete list of foods that do not have cholesterol. Contact a dietitian for more information. ?Summary ?The body needs cholesterol in small amounts, but too much cholesterol can cause damage to the arteries and heart. ?Cholesterol is found in animal-based foods, such as meat, seafood, and dairy. Generally, low-fat dairy and lean meats have less cholesterol than full-fat dairy and fatty meats. ?This information is not intended to replace advice given to you by your health care provider. Make sure you discuss any questions you have with your health care provider. ?Document Revised: 02/05/2021 Document Reviewed: 02/05/2021 ?Elsevier Patient Education ? 2023 [employer] ? ?

## 2022-11-22 NOTE — Assessment & Plan Note (Signed)
hgba1c to be checked, minimize simple carbs. Increase exercise as tolerated. Continue current meds  

## 2022-11-22 NOTE — Assessment & Plan Note (Signed)
Tolerating statin, encouraged heart healthy diet, avoid trans fats, minimize simple carbs and saturated fats. Increase exercise as tolerated 

## 2022-11-22 NOTE — Assessment & Plan Note (Signed)
Check labs today Pt is on synthroid

## 2022-11-22 NOTE — Progress Notes (Signed)
Subjective:   By signing my name below, I, Madelin Rear, attest that this documentation has been prepared under the direction and in the presence of [name], DO. 11/22/2022.    Patient ID: [name], male    DOB: May 22, 1946, 77 y.o.   MRN: [id number]  Chief Complaint  Patient presents with   Hypothyroidism   Diabetes   Hyperlipidemia   Follow-up    HPI Patient is in today for an office visit.  LE edema: Last night while getting ready for bed he noticed having a little swelling in his legs. This has resolved at this time.  Dry eyes: Additionally he complains of dry eyes. He is unable to maintain focus while writing or viewing a computer screen. OTC eye drops have not been effective. No signs of bulging eyes.  Blood Pressure: He has been working with Diona Browner, NP in cardiology regarding his blood  pressure. In clinic today his reading is 136/78. BP Readings from Last 3 Encounters:  11/22/22 136/78  11/18/22 (!) 158/88  05/23/22 (!) 150/82   Blood Sugars: Lately he has not been monitoring his blood sugars at home. Generally he is feeling great. Lab Results  Component Value Date   HGBA1C 5.2 05/23/2022   [place]: He states that he is currently on topical chemotherapy with [place]. He goes through periods of 4 weeks of application with 2 weeks of recovery. His arms will be treated next, followed by his chest.  Denies having any fever, new muscle pain, joint pain, new moles, congestion, sinus pain, sore throat, chest pain, palpitations, cough, SOB, wheezing, n/v/d, constipation, blood in stool, dysuria, frequency, hematuria, at this time.  Past Medical History:  Diagnosis Date   Abdominal bruit 07/22/2010   Acute sinusitis 03/26/2015   Acute suppr otitis media w spon rupt ear drum, recur, r ear 04/06/2017   Angina pectoris 12/11/2008   Benign essential tremor 08/29/2017   Bilateral sensorineural hearing loss 11/30/2015   CAD S/P percutaneous coronary  angioplasty 12/11/2008   LAD PCI '03,  Cath 2010 showed 40% LAD- medical Rx Myoview March 2020 negative for ischemia but positive for ST depression- Imdur added.  Formatting of this note might be different from the original. LAD PCI '03,  Cath 2010 showed 40% LAD- medical Rx Myoview March 2020 negative for ischemia but positive for ST depression- Imdur added.  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 Echo May 2020- EF 60-65% with mild LVH  Last Assessment & Plan:  Well controlled, no changes to meds. Encouraged heart healthy diet such as the DASH diet and exercise as tolerated.   Eustachian tube dysfunction, bilateral 11/30/2015   History of colonic polyps 07/12/2010   History of GI bleed 04/23/2020   History of left mastoidectomy 02/20/2020   History of placement of ear tubes    Hyperlipidemia associated with type 2 diabetes mellitus 07/10/2007   Hyperthyroidism 07/12/2010   Hypothyroidism 07/10/2007   Internal hemorrhoids    Intractable episodic cluster headache 08/29/2017  Major depressive disorder 07/10/2007   Mastoiditis of both sides 09/21/2016   Migraine headache    Mild neurocognitive disorder due to multiple etiologies 12/25/2020   Non-insulin dependent type 2 diabetes mellitus 07/06/2010   Osteoarthritis of multiple joints 07/10/2007   Perennial allergic rhinitis 09/22/2016   Postprocedural urinary retention    Presbycusis of both ears 01/23/2018   Skin cancer, basal cell    Thrombocytopenia    TIA  (transient ischemic attack) 2006   per patient's report. He was never officially given this diagnosis   Upper airway cough syndrome 09/25/2017    Past Surgical History:  Procedure Laterality Date   COLONOSCOPY  2011   coronary artery disease status post placement     of drug-eluting stent in the LAD in 2003,eEF 65% then   [place] N/A 12/08/2020   Procedure: CLOSURE LEFT POST AURICULAR SCALP WOUND;  Surgeon: Irene Limbo, MD;  Location: [facility];  Service: Plastics;  Laterality: N/A;   esophogeal dilation     fatty tissue removed     from neck 2003   HERNIA REPAIR  2007   mastoidectomy with tympanoplasty     MELANOMA EXCISION Left 11/17/2020   Procedure: WIDE LOCAL EXCISION, ADVANCED FLAP CLOSURE LEFT POSTERIOR EAR MELANOMA;  Surgeon: Stark Klein, MD;  Location: [facility];  Service: General;  Laterality: Left;   MELANOMA EXCISION Left 12/01/2020   Procedure: RE-EXCISION LEFT POSTERIOR AURICULAR MELANOMA WITH TEMPORARY CLOSURE;  Surgeon: Stark Klein, MD;  Location: [facility];  Service: General;  Laterality: Left;  60 MIN TOTAL   POLYPECTOMY  2011   +TA   right toe bone spur surgery     SKIN FULL THICKNESS GRAFT Left 12/08/2020   Procedure: Full thickness skin graft from left upper arm;  Surgeon: Irene Limbo, MD;  Location: [facility];  Service: Plastics;  Laterality: Left;   THYROIDECTOMY, PARTIAL  07/2002   right thyroid   TONSILLECTOMY     UPPER GI ENDOSCOPY      Family History  Problem Relation Age of Onset   Heart disease Mother    Alzheimer's disease Mother    Heart disease Father        triple by pass   Hypertension Brother        Prostate Cancer   Prostate cancer Brother    Hypertension Brother    Hypertension Brother    COPD Brother    Colon cancer Neg Hx    Colon polyps Neg Hx    Rectal cancer Neg Hx    Stomach cancer Neg Hx    Esophageal cancer Neg Hx     Social History    Socioeconomic History   Marital status: Married    Spouse name: Charleen   Number of children: 0   Years of education: 14   Highest education level: Associate degree: occupational, Hotel manager, or vocational program  Occupational History   Occupation: retired    Fish farm manager: RETIRED    Comment: accountant  Tobacco Use   Smoking status: Former    Years: 5.00    Types: Cigarettes    Quit date: 10/10/1965    Years since quitting: 62.1   Smokeless tobacco: Never  Vaping Use   Vaping Use: Never used  Substance and Sexual Activity   Alcohol use: No   Drug use: No   Sexual activity: Yes    Partners: Female  Other Topics Concern   Not on file  Social History Narrative  Pt lives with spouse at private home in 2 story home- he has no children- right handed- he drinks coffee daily, tea sometimes, soda not all the time. Exercise-- treadmill, stationary bike   Right handed   Social Determinants of Health   Financial Resource Strain: Low Risk  (11/08/2021)   Overall Financial Resource Strain (CARDIA)    Difficulty of Paying Living Expenses: Not hard at all  Food Insecurity: No Food Insecurity (11/10/2022)   Hunger Vital Sign    Worried About Running Out of Food in the Last Year: Never true    Ran Out of Food in the Last Year: Never true  Transportation Needs: No Transportation Needs (11/10/2022)   PRAPARE - Hydrologist (Medical): No    Lack of Transportation (Non-Medical): No  Physical Activity: Sufficiently Active (11/08/2021)   Exercise Vital Sign    Days of Exercise per Week: 7 days    Minutes of Exercise per Session: 30 min  Stress: No Stress Concern Present (11/08/2021)   [place]    Feeling of Stress : Not at all  Social Connections: Moderately Integrated (11/08/2021)   Social Connection and Isolation Panel [NHANES]    Frequency of Communication with Friends and Family: Three times a  week    Frequency of Social Gatherings with Friends and Family: More than three times a week    Attends Religious Services: More than 4 times per year    Active Member of [employer] or Organizations: No    Attends Archivist Meetings: Never    Marital Status: Married  Human resources officer Violence: Not At Risk (11/10/2022)   Humiliation, Afraid, Rape, and Kick questionnaire    Fear of Current or Ex-Partner: No    Emotionally Abused: No    Physically Abused: No    Sexually Abused: No    Outpatient Medications Prior to Visit  Medication Sig Dispense Refill   aspirin 81 MG tablet Take 81 mg by mouth daily.     azelastine (ASTELIN) 0.1 % nasal spray Place 1 spray into both nostrils daily as needed for rhinitis. Use in each nostril as directed     Biotin 1000 MCG tablet Take 1,000 mcg by mouth in the morning, at noon, and at bedtime.     Boswellia Serrata (BOSWELLIA PO) Take 500 mg by mouth 3 (three) times daily.     cholecalciferol (VITAMIN D) 25 MCG (1000 UNIT) tablet Take 3,000 Units by mouth 3 (three) times daily.     Cinnamon 500 MG capsule Take 1,000 mg by mouth 2 (two) times daily.     Coenzyme Q10 (COQ-10) 100 MG capsule Take 1 capsule (100 mg total) by mouth daily.     Cyanocobalamin (VITAMIN B-12) 1000 MCG SUBL Take 1,000 mcg by mouth daily.     Diclofenac Sodium 3 % GEL Qid prn (Patient taking differently: Apply 1 application  topically daily as needed (Arthritis).) 100 g 3   ezetimibe (ZETIA) 10 MG tablet TAKE ONE TABLET BY MOUTH DAILY 90 tablet 3   Ginger, Zingiber officinalis, (GINGER ROOT) 550 MG CAPS Take 550 mg by mouth 2 (two) times daily.     hydrALAZINE (APRESOLINE) 50 MG tablet Take 1.5 tablets (75 mg total) by mouth 3 (three) times daily. 270 tablet 5   levothyroxine (SYNTHROID) 112 MCG tablet Take 1 tablet (112 mcg total) by mouth daily before breakfast. 90 tablet 0   metFORMIN (GLUCOPHAGE) 500 MG tablet Take  1 tablet (500 mg total) by mouth 2 (two) times daily with a  meal. 180 tablet 3   Multiple Vitamins-Minerals (THERAGRAN-M ADVANCED 50 PLUS PO) Take 1 tablet by mouth daily.     Nerve Stimulator (CEFALY KIT) DEVI Apply 1 application topically daily at 12 noon. Electrical migraine kit for prevention on treatment of migraine headache     olmesartan (BENICAR) 40 MG tablet Take 1 tablet (40 mg total) by mouth daily. 90 tablet 3   pantoprazole (PROTONIX) 40 MG tablet TAKE ONE TABLET BY MOUTH DAILY 90 tablet 1   propranolol ER (INDERAL LA) 160 MG SR capsule TAKE ONE CAPSULE BY MOUTH DAILY 90 capsule 1   simvastatin (ZOCOR) 40 MG tablet TAKE ONE TABLET BY MOUTH AT BEDTIME 90 tablet 1   tamsulosin (FLOMAX) 0.4 MG CAPS capsule Take 0.4 mg by mouth daily. 30 capsule    topiramate (TOPAMAX) 50 MG tablet TAKE ONE TABLET BY MOUTH EVERY NIGHT AT BEDTIME 90 tablet 1   No facility-administered medications prior to visit.    Allergies  Allergen Reactions   Crestor [Rosuvastatin Calcium] Other (See Comments)    Severe joint and muscle pain   Nitrofurantoin Swelling    Ankles/legs SOB   Rosuvastatin Other (See Comments)    REACTION: REALLY BAD JOINT/MUSCLE PAIN   Sulfa Antibiotics Other (See Comments)    Severe crippling  joint and muscle pain and redness   Sulfamethoxazole-Trimethoprim Other (See Comments)    Joint and muscle pain   Sulfasalazine Other (See Comments)    Joint and muscle pain   Amlodipine Swelling   Codeine Other (See Comments)    Unknown   Hydrocodone-Acetaminophen Other (See Comments)    To strong vicodin    Other     Apples and tomatoes...... Nausea and migraines.      Review of Systems  Constitutional:  Negative for fever and malaise/fatigue.  HENT:  Negative for congestion, sinus pain and sore throat.   Eyes:  Negative for blurred vision.       Dry eye bilaterally.  Respiratory:  Negative for cough, shortness of breath and wheezing.   Cardiovascular:  Negative for chest pain, palpitations and leg swelling.  Gastrointestinal:   Negative for blood in stool, constipation, diarrhea, nausea and vomiting.  Genitourinary:  Negative for dysuria, frequency and hematuria.  Musculoskeletal:  Negative for back pain, joint pain and myalgias.  Skin:  Negative for rash.  Neurological:  Negative for loss of consciousness and headaches.       Objective:    Physical Exam Vitals and nursing note reviewed.  Constitutional:      General: He is not in acute distress.    Appearance: Normal appearance. He is not ill-appearing.  HENT:     Head: Normocephalic and atraumatic.     Right Ear: Tympanic membrane, ear canal and external ear normal.     Left Ear: Tympanic membrane, ear canal and external ear normal.  Eyes:     Extraocular Movements: Extraocular movements intact.     Pupils: Pupils are equal, round, and reactive to light.  Cardiovascular:     Rate and Rhythm: Normal rate and regular rhythm.     Heart sounds: Normal heart sounds. No murmur heard.    No gallop.  Pulmonary:     Effort: Pulmonary effort is normal. No respiratory distress.     Breath sounds: Normal breath sounds. No wheezing or rales.  Feet:     Comments: Monofilament normal. Skin:    General:  Skin is warm and dry.  Neurological:     General: No focal deficit present.     Mental Status: He is alert and oriented to person, place, and time.  Psychiatric:        Mood and Affect: Mood normal.        Behavior: Behavior normal.     BP 136/78 (BP Location: Left Arm, Patient Position: Sitting, Cuff Size: Normal)   Pulse 60   Temp (!) 97.5 F (36.4 C) (Oral)   Resp 18   Ht 5' 9"$  (1.753 m)   Wt 161 lb 3.2 oz (73.1 kg)   SpO2 96%   BMI 23.81 kg/m  Wt Readings from Last 3 Encounters:  11/22/22 161 lb 3.2 oz (73.1 kg)  11/18/22 164 lb (74.4 kg)  05/23/22 155 lb 3.2 oz (70.4 kg)    Diabetic Foot Exam - Simple   Simple Foot Form Diabetic Foot exam was performed with the following findings: Yes 11/22/2022 12:24 PM  Visual Inspection No deformities,  no ulcerations, no other skin breakdown bilaterally: Yes Sensation Testing Intact to touch and monofilament testing bilaterally: Yes Pulse Check Posterior Tibialis and Dorsalis pulse intact bilaterally: Yes Comments    Lab Results  Component Value Date   WBC 6.6 05/23/2022   HGB 15.4 05/23/2022   HCT 45.6 05/23/2022   PLT 132.0 (L) 05/23/2022   GLUCOSE 81 05/23/2022   CHOL 92 05/23/2022   TRIG 108.0 05/23/2022   HDL 45.10 05/23/2022   LDLCALC 25 05/23/2022   ALT 16 05/23/2022   AST 18 05/23/2022   NA 141 05/23/2022   K 4.9 05/23/2022   CL 106 05/23/2022   CREATININE 1.50 05/23/2022   BUN 26 (H) 05/23/2022   CO2 26 05/23/2022   TSH 1.37 05/23/2022   PSA 10.44 (H) 11/23/2021   INR 1.0 07/01/2019   HGBA1C 5.2 05/23/2022   MICROALBUR 6.6 (H) 05/23/2022    Lab Results  Component Value Date   TSH 1.37 05/23/2022   Lab Results  Component Value Date   WBC 6.6 05/23/2022   HGB 15.4 05/23/2022   HCT 45.6 05/23/2022   MCV 95.9 05/23/2022   PLT 132.0 (L) 05/23/2022   Lab Results  Component Value Date   NA 141 05/23/2022   K 4.9 05/23/2022   CHLORIDE 107 01/05/2017   CO2 26 05/23/2022   GLUCOSE 81 05/23/2022   BUN 26 (H) 05/23/2022   CREATININE 1.50 05/23/2022   BILITOT 0.6 05/23/2022   ALKPHOS 57 05/23/2022   AST 18 05/23/2022   ALT 16 05/23/2022   PROT 6.9 05/23/2022   ALBUMIN 4.3 05/23/2022   CALCIUM 9.4 05/23/2022   ANIONGAP 9 11/19/2021   EGFR 42 (L) 02/17/2022   GFR 44.37 (L) 05/23/2022   Lab Results  Component Value Date   CHOL 92 05/23/2022   Lab Results  Component Value Date   HDL 45.10 05/23/2022   Lab Results  Component Value Date   LDLCALC 25 05/23/2022   Lab Results  Component Value Date   TRIG 108.0 05/23/2022   Lab Results  Component Value Date   CHOLHDL 2 05/23/2022   Lab Results  Component Value Date   HGBA1C 5.2 05/23/2022       Assessment & Plan:   Problem List Items Addressed This Visit       Unprioritized   Type  2 diabetes mellitus with hyperglycemia, without long-term current use of insulin ([place])    hgba1c to be checked , minimize simple carbs.  Increase exercise as tolerated. Continue current meds       Relevant Orders   Hemoglobin A1c   Hypothyroidism - Primary    Check labs today Pt is on synthroid       Relevant Orders   TSH   Hyperlipidemia associated with type 2 diabetes mellitus ([place])    Tolerating statin, encouraged heart healthy diet, avoid trans fats, minimize simple carbs and saturated fats. Increase exercise as tolerated       Relevant Orders   Lipid panel   CBC with Differential/Platelet   Comprehensive metabolic panel   Other Visit Diagnoses     Primary hypertension       Relevant Orders   Lipid panel   CBC with Differential/Platelet   Comprehensive metabolic panel   BPH associated with nocturia       Relevant Orders   PSA        No orders of the defined types were placed in this encounter.   IAnn Held, DO, personally preformed the services described in this documentation.  All medical record entries made by the scribe were at my direction and in my presence.  I have reviewed the chart and discharge instructions (if applicable) and agree that the record reflects my personal performance and is accurate and complete. 11/22/2022.  I,Mathew Stumpf,acting as a Education administrator for [employer], DO.,have documented all relevant documentation on the behalf of [name], DO,as directed by  [name], DO while in the presence of [name], DO.   [name], DO

## 2022-11-23 ENCOUNTER — Other Ambulatory Visit (INDEPENDENT_AMBULATORY_CARE_PROVIDER_SITE_OTHER): Payer: Medicare Other

## 2022-11-23 DIAGNOSIS — I10 Essential (primary) hypertension: Secondary | ICD-10-CM | POA: Diagnosis not present

## 2022-11-23 DIAGNOSIS — E1165 Type 2 diabetes mellitus with hyperglycemia: Secondary | ICD-10-CM

## 2022-11-23 DIAGNOSIS — N401 Enlarged prostate with lower urinary tract symptoms: Secondary | ICD-10-CM

## 2022-11-23 DIAGNOSIS — E785 Hyperlipidemia, unspecified: Secondary | ICD-10-CM | POA: Diagnosis not present

## 2022-11-23 DIAGNOSIS — R351 Nocturia: Secondary | ICD-10-CM | POA: Diagnosis not present

## 2022-11-23 DIAGNOSIS — E1169 Type 2 diabetes mellitus with other specified complication: Secondary | ICD-10-CM

## 2022-11-23 DIAGNOSIS — E039 Hypothyroidism, unspecified: Secondary | ICD-10-CM

## 2022-11-23 LAB — COMPREHENSIVE METABOLIC PANEL
ALT: 16 U/L (ref 0–53)
AST: 16 U/L (ref 0–37)
Albumin: 4.1 g/dL (ref 3.5–5.2)
Alkaline Phosphatase: 53 U/L (ref 39–117)
BUN: 29 mg/dL — ABNORMAL HIGH (ref 6–23)
CO2: 25 mEq/L (ref 19–32)
Calcium: 9.1 mg/dL (ref 8.4–10.5)
Chloride: 109 mEq/L (ref 96–112)
Creatinine, Ser: 1.49 mg/dL (ref 0.40–1.50)
GFR: 44.57 mL/min — ABNORMAL LOW (ref >60.00)
Glucose, Bld: 119 mg/dL — ABNORMAL HIGH (ref 70–99)
Potassium: 3.9 mEq/L (ref 3.5–5.1)
Sodium: 141 mEq/L (ref 135–145)
Total Bilirubin: 0.7 mg/dL (ref 0.2–1.2)
Total Protein: 7.1 g/dL (ref 6.0–8.3)

## 2022-11-23 LAB — CBC WITH DIFFERENTIAL/PLATELET
Basophils Absolute: 0 10*3/uL (ref 0.0–0.1)
Basophils Relative: 0.8 % (ref 0.0–3.0)
Eosinophils Absolute: 0.3 10*3/uL (ref 0.0–0.7)
Eosinophils Relative: 5.9 % — ABNORMAL HIGH (ref 0.0–5.0)
HCT: 45.1 % (ref 39.0–52.0)
Hemoglobin: 15.5 g/dL (ref 13.0–17.0)
Lymphocytes Relative: 16.6 % (ref 12.0–46.0)
Lymphs Abs: 1 10*3/uL (ref 0.7–4.0)
MCHC: 34.4 g/dL (ref 30.0–36.0)
MCV: 93.5 fl (ref 78.0–100.0)
Monocytes Absolute: 0.6 10*3/uL (ref 0.1–1.0)
Monocytes Relative: 10.3 % (ref 3.0–12.0)
Neutro Abs: 3.8 10*3/uL (ref 1.4–7.7)
Neutrophils Relative %: 66.4 % (ref 43.0–77.0)
Platelets: 141 10*3/uL — ABNORMAL LOW (ref 150.0–400.0)
RBC: 4.83 Mil/uL (ref 4.22–5.81)
RDW: 12.9 % (ref 11.5–15.5)
WBC: 5.8 10*3/uL (ref 4.0–10.5)

## 2022-11-23 LAB — LIPID PANEL
Cholesterol: 108 mg/dL (ref 0–200)
HDL: 50.3 mg/dL (ref >39.00)
LDL Cholesterol: 48 mg/dL (ref 0–99)
NonHDL: 57.34
Total CHOL/HDL Ratio: 2
Triglycerides: 45 mg/dL (ref 0.0–149.0)
VLDL: 9 mg/dL (ref 0.0–40.0)

## 2022-11-23 LAB — TSH: TSH: 1.6 u[IU]/mL (ref 0.35–5.50)

## 2022-11-23 LAB — HEMOGLOBIN A1C: Hgb A1c MFr Bld: 5.3 % (ref 4.6–6.5)

## 2022-11-23 LAB — PSA: PSA: 5.58 ng/mL — ABNORMAL HIGH (ref 0.10–4.00)

## 2022-12-02 DIAGNOSIS — I6523 Occlusion and stenosis of bilateral carotid arteries: Secondary | ICD-10-CM | POA: Diagnosis not present

## 2022-12-02 DIAGNOSIS — I10 Essential (primary) hypertension: Secondary | ICD-10-CM | POA: Diagnosis not present

## 2022-12-02 DIAGNOSIS — N1831 Chronic kidney disease, stage 3a: Secondary | ICD-10-CM | POA: Diagnosis not present

## 2022-12-02 DIAGNOSIS — E785 Hyperlipidemia, unspecified: Secondary | ICD-10-CM | POA: Diagnosis not present

## 2022-12-02 DIAGNOSIS — Z8669 Personal history of other diseases of the nervous system and sense organs: Secondary | ICD-10-CM | POA: Diagnosis not present

## 2022-12-02 DIAGNOSIS — I2510 Atherosclerotic heart disease of native coronary artery without angina pectoris: Secondary | ICD-10-CM | POA: Diagnosis not present

## 2022-12-03 LAB — BASIC METABOLIC PANEL WITH GFR
BUN/Creatinine Ratio: 16 (ref 10–24)
BUN: 24 mg/dL (ref 8–27)
CO2: 22 mmol/L (ref 20–29)
Calcium: 9.2 mg/dL (ref 8.6–10.2)
Chloride: 106 mmol/L (ref 96–106)
Creatinine, Ser: 1.46 mg/dL — ABNORMAL HIGH (ref 0.76–1.27)
Glucose: 112 mg/dL — ABNORMAL HIGH (ref 70–99)
Potassium: 4.2 mmol/L (ref 3.5–5.2)
Sodium: 142 mmol/L (ref 134–144)
eGFR: 49 mL/min/1.73 — ABNORMAL LOW

## 2022-12-06 ENCOUNTER — Telehealth: Payer: Self-pay

## 2022-12-06 NOTE — Telephone Encounter (Signed)
Spoke with pt. Pt was notified of lab results. Pt will continue current medication and follow up as planned.

## 2022-12-19 ENCOUNTER — Ambulatory Visit: Payer: Medicare Other | Attending: Internal Medicine | Admitting: Pharmacist

## 2022-12-19 ENCOUNTER — Encounter: Payer: Self-pay | Admitting: Pharmacist

## 2022-12-19 VITALS — BP 154/78

## 2022-12-19 DIAGNOSIS — I10 Essential (primary) hypertension: Secondary | ICD-10-CM | POA: Insufficient documentation

## 2022-12-19 DIAGNOSIS — E1151 Type 2 diabetes mellitus with diabetic peripheral angiopathy without gangrene: Secondary | ICD-10-CM | POA: Diagnosis not present

## 2022-12-19 NOTE — Progress Notes (Signed)
Patient ID: [name]                 DOB: 1946-08-22                      MRN: [id number]     HPI: LENY ONDER is a 77 y.o. male referred [facility]. PMH is significant for HTN, CAD, T2DM, HLD, and angina. Seen last month and valsartan was switched to olmesartan.  Patient presents today with wife. History of longstanding HTN. Does not like when his BP is in a normal range. Feels sleepy if BP is in 120s.  Currently on olmesartan '40mg'$  daily and hydralazine '75mg'$  TID. Amlodipine caused LEE.  Reports he is he often outside however wife says he is not actually exercising. He is watching his cat walk around.  Diet very high in sodium. For lunch will eat frozen meals, cans of soup and wife says he often will add salt to food as well.   Recent home BP readings:  140/69 146/74 147/65 172/82 132/60  Current HTN meds:  Hydralazine '75mg'$  TID Olmesartan '40mg'$  daily Propranolol '160mg'$  daily  BP goal: <130/80   Wt Readings from Last 3 Encounters:  11/22/22 161 lb 3.2 oz (73.1 kg)  11/18/22 164 lb (74.4 kg)  05/23/22 155 lb 3.2 oz (70.4 kg)   BP Readings from Last 3 Encounters:  12/19/22 (!) 154/78  11/22/22 136/78  11/18/22 (!) 158/88   Pulse Readings from Last 3 Encounters:  11/22/22 60  11/18/22 62  05/23/22 65    Renal function: CrCl cannot be calculated (Unknown ideal weight.).  Past Medical History:  Diagnosis Date   Abdominal bruit 07/22/2010   Acute sinusitis 03/26/2015   Acute suppr otitis media w spon rupt ear drum, recur, r ear 04/06/2017   Angina pectoris 12/11/2008   Benign essential tremor 08/29/2017   Bilateral sensorineural hearing loss 11/30/2015   CAD S/P percutaneous coronary angioplasty 12/11/2008   LAD PCI '03,  Cath 2010 showed 40% LAD- medical Rx Myoview March 2020 negative for ischemia but positive for ST depression- Imdur added.  Formatting of this note might be different from the original. LAD PCI '03,  Cath 2010 showed 40% LAD-  medical Rx Myoview March 2020 negative for ischemia but positive for ST depression- Imdur added.  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 Echo May 2020- EF 60-65% with mild LVH  Last Assessment & Plan:  Well controlled, no changes to meds. Encouraged heart healthy diet such as the DASH diet and exercise as tolerated.   Eustachian tube dysfunction, bilateral 11/30/2015   History of colonic polyps 07/12/2010   History of GI bleed 04/23/2020   History of left mastoidectomy 02/20/2020   History of placement of ear tubes    Hyperlipidemia associated with type 2 diabetes mellitus 07/10/2007   Hyperthyroidism 07/12/2010   Hypothyroidism 07/10/2007   Internal hemorrhoids    Intractable episodic cluster headache 08/29/2017   Major depressive disorder 07/10/2007  Mastoiditis of both sides 09/21/2016   Migraine headache    Mild neurocognitive disorder due to multiple etiologies 12/25/2020   Non-insulin dependent type 2 diabetes mellitus 07/06/2010   Osteoarthritis of multiple joints 07/10/2007   Perennial allergic rhinitis 09/22/2016   Postprocedural urinary retention    Presbycusis of both ears 01/23/2018   Skin cancer, basal cell    Thrombocytopenia    TIA (transient ischemic attack) 2006   per patient's report. He was never officially given this diagnosis   Upper airway cough syndrome 09/25/2017    Current Outpatient Medications on File Prior to Visit  Medication Sig Dispense Refill   aspirin 81 MG tablet Take 81 mg  by mouth daily.     azelastine (ASTELIN) 0.1 % nasal spray Place 1 spray into both nostrils daily as needed for rhinitis. Use in each nostril as directed     Biotin 1000 MCG tablet Take 1,000 mcg by mouth in the morning, at noon, and at bedtime.     Boswellia Serrata (BOSWELLIA PO) Take 500 mg by mouth 3 (three) times daily.     cholecalciferol (VITAMIN D) 25 MCG (1000 UNIT) tablet Take 3,000 Units by mouth 3 (three) times daily.     Cinnamon 500 MG capsule Take 1,000 mg by mouth 2 (two) times daily.     Coenzyme Q10 (COQ-10) 100 MG capsule Take 1 capsule (100 mg total) by mouth daily.     Cyanocobalamin (VITAMIN B-12) 1000 MCG SUBL Take 1,000 mcg by mouth daily.     Diclofenac Sodium 3 % GEL Qid prn (Patient taking differently: Apply 1 application  topically daily as needed (Arthritis).) 100 g 3   ezetimibe (ZETIA) 10 MG tablet TAKE ONE TABLET BY MOUTH DAILY 90 tablet 3   Ginger, Zingiber officinalis, (GINGER ROOT) 550 MG CAPS Take 550 mg by mouth 2 (two) times daily.     hydrALAZINE (APRESOLINE) 50 MG tablet Take 1.5 tablets (75 mg total) by mouth 3 (three) times daily. 270 tablet 5   levothyroxine (SYNTHROID) 112 MCG tablet Take 1 tablet (112 mcg total) by mouth daily before breakfast. 90 tablet 0   metFORMIN (GLUCOPHAGE) 500 MG tablet Take 1 tablet (500 mg total) by mouth 2 (two) times daily with a meal. 180 tablet 3   Multiple Vitamins-Minerals (THERAGRAN-M ADVANCED 50 PLUS PO) Take 1 tablet by mouth daily.     Nerve Stimulator (CEFALY KIT) DEVI Apply 1 application topically daily at 12 noon. Electrical migraine kit for prevention on treatment of migraine headache     olmesartan (BENICAR) 40 MG tablet Take 1 tablet (40 mg total) by mouth daily. 90 tablet 3   pantoprazole (PROTONIX) 40 MG tablet TAKE ONE TABLET BY MOUTH DAILY 90 tablet 1   propranolol ER (INDERAL LA) 160 MG SR capsule TAKE ONE CAPSULE BY MOUTH DAILY 90 capsule 1   simvastatin (ZOCOR) 40 MG tablet TAKE ONE TABLET BY MOUTH AT  BEDTIME 90 tablet 1   tamsulosin (FLOMAX) 0.4 MG CAPS capsule Take 0.4 mg by mouth daily. 30 capsule    topiramate (TOPAMAX) 50 MG tablet TAKE ONE TABLET BY MOUTH EVERY NIGHT AT BEDTIME 90 tablet 1   No current facility-administered medications on file prior to visit.    Allergies  Allergen Reactions   Crestor [Rosuvastatin Calcium] Other (See Comments)    Severe joint and muscle pain   Nitrofurantoin Swelling    Ankles/legs SOB   Rosuvastatin Other (See Comments)    REACTION: REALLY BAD JOINT/MUSCLE PAIN  Sulfa Antibiotics Other (See Comments)    Severe crippling  joint and muscle pain and redness   Sulfamethoxazole-Trimethoprim Other (See Comments)    Joint and muscle pain   Sulfasalazine Other (See Comments)    Joint and muscle pain   Amlodipine Swelling   Codeine Other (See Comments)    Unknown   Hydrocodone-Acetaminophen Other (See Comments)    To strong vicodin    Other     Apples and tomatoes...... Nausea and migraines.       Assessment/Plan:  1. Hypertension -    HYPERTENSION CONTROL Vitals:   12/19/22 1533 12/19/22 1539  BP: (!) 151/76 (!) 154/78    The patient's blood pressure is elevated above target today.  In order to address the patient's elevated BP: Blood pressure will be monitored at home to determine if medication changes need to be made.    Patient BP remains uncontrolled and above goal of <130/80. Likely diet is contributory. Discussed importance of reducing sodium in diet to help reduce blood pressure. Unknown how much he is currently eating since he does not look at label but likely greater than '2000mg'$  and continuing monitoring BP at home. Follow up in 4 weeks. If BP does not decrease with salt restriction, will increase medications.  Continue hydralazine '75mg'$  TID Continue olmesartan '40mg'$  daily Recheck in 4 weeks  Karren Cobble, PharmD, [place], [place], [place], [place], [place], [address] Phone: [phone], Fax:  [phone]

## 2022-12-19 NOTE — Patient Instructions (Addendum)
It was nice meeting you two today  We would like your blood pressure to be less than 130/80  Please continue your: Olmesartan '40mg'$  daily Hydralazine '75mg'$  three times a day  Try to be conscious of how much sodium you are eating. Try to limit your intake to less than 2000 mg a day  Please continue to check your blood pressure at home daily  We will see you back in 4 weeks  Karren Cobble, PharmD, [place], [place], [place], [address] [place], [place], [address] Phone: [phone], Fax: [phone]

## 2022-12-21 ENCOUNTER — Other Ambulatory Visit: Payer: Self-pay | Admitting: Family Medicine

## 2022-12-21 DIAGNOSIS — E785 Hyperlipidemia, unspecified: Secondary | ICD-10-CM

## 2022-12-23 DIAGNOSIS — Z9089 Acquired absence of other organs: Secondary | ICD-10-CM | POA: Diagnosis not present

## 2022-12-23 DIAGNOSIS — H906 Mixed conductive and sensorineural hearing loss, bilateral: Secondary | ICD-10-CM | POA: Diagnosis not present

## 2022-12-23 DIAGNOSIS — Z4589 Encounter for adjustment and management of other implanted devices: Secondary | ICD-10-CM | POA: Diagnosis not present

## 2022-12-23 DIAGNOSIS — H7312 Chronic myringitis, left ear: Secondary | ICD-10-CM | POA: Diagnosis not present

## 2022-12-23 DIAGNOSIS — H6993 Unspecified Eustachian tube disorder, bilateral: Secondary | ICD-10-CM | POA: Diagnosis not present

## 2023-01-08 ENCOUNTER — Encounter: Payer: Self-pay | Admitting: Family Medicine

## 2023-01-08 DIAGNOSIS — E039 Hypothyroidism, unspecified: Secondary | ICD-10-CM

## 2023-01-09 MED ORDER — LEVOTHYROXINE SODIUM 112 MCG PO TABS: 112.0000 ug | ORAL_TABLET | Freq: Every day | ORAL | 1 refills | Status: DC

## 2023-01-18 NOTE — Progress Notes (Signed)
HPI: FU CAD with prior PCI of his LAD in May 2003. Cardiac cath 2010 showed 30-40% LAD just distal to the stent. There was no other obstructive disease noted. The ejection fraction is 50%. He has been treated medically. Abdominal ultrasound in Oct 2011 showed no aneurysm. Nuclear study in March 2020 showed ejection fraction 56%.  Patient had significant ST changes but perfusion was normal.  Echocardiogram May 2020 showed normal LV function, mild left atrial enlargement.  Cardiac MRI October 2020 showed basal anteroseptal enhancement which could be seen with prior myocarditis but also could be seen with sarcoid. Pattern not consistent with amyloid.  Ejection fraction 61% and normal. Carotid Dopplers February 2023 showed 1 to 39% bilateral stenosis. Hospitalized February of this year with hypertensive urgency. Renal Dopplers August 2023 showed no renal artery stenosis. Since I last saw him, there is no dyspnea, chest pain, palpitations or syncope.  Current Outpatient Medications  Medication Sig Dispense Refill   aspirin 81 MG tablet Take 81 mg by mouth daily.     azelastine (ASTELIN) 0.1 % nasal spray Place 1 spray into both nostrils daily as needed for rhinitis. Use in each nostril as directed     Biotin 1000 MCG tablet Take 1,000 mcg by mouth in the morning, at noon, and at bedtime.     Boswellia Serrata (BOSWELLIA PO) Take 500 mg by mouth 3 (three) times daily.     cholecalciferol (VITAMIN D) 25 MCG (1000 UNIT) tablet Take 3,000 Units by mouth 3 (three) times daily.     Cinnamon 500 MG capsule Take 1,000 mg by mouth 2 (two) times daily.     Coenzyme Q10 (COQ-10) 100 MG capsule Take 1 capsule (100 mg total) by mouth daily.     Cyanocobalamin (VITAMIN B-12) 1000 MCG SUBL Take 1,000 mcg by mouth daily.     Diclofenac Sodium 3 % GEL Qid prn (Patient taking differently: Apply 1 application  topically daily as needed (Arthritis).) 100 g 3   ezetimibe (ZETIA) 10 MG tablet TAKE ONE TABLET BY MOUTH  DAILY 90 tablet 3   Ginger, Zingiber officinalis, (GINGER ROOT) 550 MG CAPS Take 550 mg by mouth 2 (two) times daily.     hydrALAZINE (APRESOLINE) 100 MG tablet Take 1 tablet (100 mg total) by mouth 3 (three) times daily. 90 tablet 5   levothyroxine (SYNTHROID) 112 MCG tablet Take 1 tablet (112 mcg total) by mouth daily before breakfast. 90 tablet 1   metFORMIN (GLUCOPHAGE) 500 MG tablet Take 1 tablet (500 mg total) by mouth 2 (two) times daily with a meal. 180 tablet 3   Multiple Vitamins-Minerals (THERAGRAN-M ADVANCED 50 PLUS PO) Take 1 tablet by mouth daily.     Nerve Stimulator (CEFALY KIT) DEVI Apply 1 application topically daily at 12 noon. Electrical migraine kit for prevention on treatment of migraine headache     olmesartan (BENICAR) 40 MG tablet Take 1 tablet (40 mg total) by mouth daily. 90 tablet 3   pantoprazole (PROTONIX) 40 MG tablet TAKE 1 TABLET BY MOUTH DAILY 90 tablet 1   propranolol ER (INDERAL LA) 160 MG SR capsule TAKE 1 CAPSULE BY MOUTH DAILY 90 capsule 1   simvastatin (ZOCOR) 40 MG tablet TAKE ONE TABLET BY MOUTH AT BEDTIME 90 tablet 1   tamsulosin (FLOMAX) 0.4 MG CAPS capsule Take 0.4 mg by mouth daily. 30 capsule    topiramate (TOPAMAX) 50 MG tablet TAKE ONE TABLET BY MOUTH EVERY NIGHT AT BEDTIME 90 tablet 1  No current facility-administered medications for this visit.     Past Medical History:  Diagnosis Date   Abdominal bruit 07/22/2010   Acute sinusitis 03/26/2015   Acute suppr otitis media w spon rupt ear drum, recur, r ear 04/06/2017   Angina pectoris 12/11/2008   Benign essential tremor 08/29/2017   Bilateral sensorineural hearing loss 11/30/2015   CAD S/P percutaneous coronary angioplasty 12/11/2008   LAD PCI '03,  Cath 2010 showed 40% LAD- medical Rx Myoview March 2020 negative for ischemia but positive for ST depression- Imdur added.  Formatting of this note might be different from the original. LAD PCI '03,  Cath 2010 showed 40% LAD- medical Rx Myoview  March 2020 negative for ischemia but positive for ST depression- Imdur added.  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 Echo May 2020- EF 60-65% with mild LVH  Last Assessment & Plan:  Well controlled, no changes to meds. Encouraged heart healthy diet such as the DASH diet and exercise as tolerated.   Eustachian tube dysfunction, bilateral 11/30/2015   History of colonic polyps 07/12/2010   History of GI bleed 04/23/2020   History of left mastoidectomy 02/20/2020   History of placement of ear tubes    Hyperlipidemia associated with type 2 diabetes mellitus 07/10/2007   Hyperthyroidism 07/12/2010   Hypothyroidism 07/10/2007   Internal hemorrhoids    Intractable episodic cluster headache 08/29/2017   Major depressive disorder 07/10/2007   Mastoiditis of both sides 09/21/2016   Migraine headache    Mild neurocognitive disorder due to multiple etiologies 12/25/2020   Non-insulin dependent type 2 diabetes mellitus 07/06/2010   Osteoarthritis of multiple joints 07/10/2007   Perennial allergic rhinitis 09/22/2016   Postprocedural urinary retention    Presbycusis of both ears 01/23/2018   Skin cancer, basal cell    Thrombocytopenia    TIA (transient ischemic attack) 2006   per patient's report. He was never officially given this diagnosis   Upper airway cough syndrome 09/25/2017    Past Surgical History:  Procedure Laterality Date   COLONOSCOPY  2011   coronary artery disease status post placement     of drug-eluting  stent in the LAD in 2003,eEF 65% then   DEBRIDEMENT AND CLOSURE WOUND N/A 12/08/2020   Procedure: CLOSURE LEFT POST AURICULAR SCALP WOUND;  Surgeon: Glenna Fellows, MD;  Location: [facility];  Service: Plastics;  Laterality: N/A;   esophogeal dilation     fatty tissue removed     from neck 2003   HERNIA REPAIR  2007   mastoidectomy with tympanoplasty     MELANOMA EXCISION Left 11/17/2020   Procedure: WIDE LOCAL EXCISION, ADVANCED FLAP CLOSURE LEFT POSTERIOR EAR MELANOMA;  Surgeon: Almond Lint, MD;  Location: [facility];  Service: General;  Laterality: Left;   MELANOMA EXCISION Left 12/01/2020   Procedure: RE-EXCISION LEFT POSTERIOR AURICULAR MELANOMA WITH TEMPORARY CLOSURE;  Surgeon: Almond Lint, MD;  Location: [facility];  Service: General;  Laterality: Left;  60 MIN TOTAL   POLYPECTOMY  2011   +TA   right toe bone spur surgery     SKIN FULL THICKNESS GRAFT Left 12/08/2020   Procedure: Full thickness skin graft from left upper arm;  Surgeon: Glenna Fellowshimmappa, Brinda, MD;  Location: [facility];  Service: Plastics;  Laterality: Left;   THYROIDECTOMY, PARTIAL  07/2002   right thyroid   TONSILLECTOMY     UPPER GI ENDOSCOPY      Social History   Socioeconomic History   Marital status: Married    Spouse name: Charleen   Number of children: 0   Years of education: 14   Highest education level: Associate degree: occupational, Scientist, product/process developmenttechnical, or vocational program  Occupational History   Occupation: retired    Associate Professormployer: RETIRED    Comment: accountant  Tobacco Use   Smoking status: Former    Years: 5    Types: Cigarettes    Quit date: 10/10/1965    Years since quitting: 57.3   Smokeless tobacco: Never  Vaping Use   Vaping Use: Never used  Substance and Sexual Activity   Alcohol use: No   Drug use: No   Sexual activity: Yes    Partners: Female  Other Topics Concern   Not on file  Social History Narrative   Pt lives with spouse at private home in 2  story home- he has no children- right handed- he drinks coffee daily, tea sometimes, soda not all the time. Exercise-- treadmill, stationary bike   Right handed   Social Determinants of Health   Financial Resource Strain: Low Risk  (11/08/2021)   Overall Financial Resource Strain (CARDIA)    Difficulty of Paying Living Expenses: Not hard at all  Food Insecurity: No Food Insecurity (11/10/2022)   Hunger Vital Sign    Worried About Running Out of Food in the Last Year: Never true    Ran Out of Food in the Last Year: Never true  Transportation Needs: No Transportation Needs (11/10/2022)   PRAPARE - Administrator, Civil ServiceTransportation    Lack of Transportation (Medical): No    Lack of Transportation (Non-Medical): No  Physical Activity: Sufficiently Active (11/08/2021)   Exercise Vital Sign    Days of Exercise per Week: 7 days    Minutes of Exercise per Session: 30 min  Stress: No Stress Concern Present (11/08/2021)   [employer] - Occupational Stress Questionnaire    Feeling of Stress : Not at all  Social Connections: Moderately Integrated (11/08/2021)   Social Connection and Isolation Panel [NHANES]    Frequency of Communication with Friends and Family: Three times a week    Frequency of Social Gatherings with Friends and Family: More than three times a week    Attends Religious Services: More than 4 times per year    Active Member of [employer] Financial[employer]ubs or Organizations: No    Attends BankerClub or Organization Meetings: Never    Marital Status: Married  Catering managerntimate Partner Violence: Not At Risk (11/10/2022)   Humiliation, Afraid, Rape, and Kick questionnaire    Fear of Current or Ex-Partner: No    Emotionally Abused: No    Physically Abused: No    Sexually Abused: No    Family History  Problem Relation Age of Onset   Heart disease Mother    Alzheimer's disease Mother    Heart disease Father        triple by pass   Hypertension Brother        Prostate Cancer   Prostate cancer Brother  Hypertension Brother    Hypertension Brother    COPD Brother    Colon cancer Neg Hx    Colon polyps Neg Hx    Rectal cancer Neg Hx    Stomach cancer Neg Hx    Esophageal cancer Neg Hx     ROS: no fevers or chills, productive cough, hemoptysis, dysphasia, odynophagia, melena, hematochezia, dysuria, hematuria, rash, seizure activity, orthopnea, PND, pedal edema, claudication. Remaining systems are negative.  Physical Exam: Well-developed well-nourished in no acute distress.  Skin is warm and dry.  HEENT is normal.  Neck is supple.  Chest is clear to auscultation with normal expansion.  Cardiovascular exam is regular rate and rhythm.  Abdominal exam nontender or distended. No masses palpated. Extremities show trace edema. neuro grossly intact   A/P  1 coronary artery disease-patient denies chest pain.  Plan to continue medical therapy with aspirin and statin.  2 hypertension-blood pressure controlled.  Continue present medications.  3 hyperlipidemia-continue Zocor and Zetia.  4 lower extremity edema-this was present in the past but has resolved.  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  Improved with treating this issue.  Olga Millers, MD

## 2023-01-20 ENCOUNTER — Ambulatory Visit: Payer: Medicare Other | Attending: Cardiology | Admitting: Pharmacist

## 2023-01-20 ENCOUNTER — Encounter: Payer: Self-pay | Admitting: Pharmacist

## 2023-01-20 VITALS — BP 150/77 | HR 59

## 2023-01-20 DIAGNOSIS — H90A21 Sensorineural hearing loss, unilateral, right ear, with restricted hearing on the contralateral side: Secondary | ICD-10-CM | POA: Insufficient documentation

## 2023-01-20 DIAGNOSIS — I10 Essential (primary) hypertension: Secondary | ICD-10-CM | POA: Insufficient documentation

## 2023-01-20 MED ORDER — HYDRALAZINE HCL 100 MG PO TABS: 100.0000 mg | ORAL_TABLET | Freq: Three times a day (TID) | ORAL | 5 refills | Status: DC

## 2023-01-20 NOTE — Progress Notes (Signed)
Patient ID: [name]                 DOB: 1946-08-22                      MRN: [id number]     HPI: [name] is a 77 y.o. male referred by [name]. PMH is significant for HTN, CAD, T2DM, HLD, and angina. Seen 2 months ago and valsartan was switched to olmesartan.  At last visit it was uncovered that patient's diet was very high in salt. Ate many salty snacks as well as canned soups and also added salt to foods. Patient decided to work on lifestyle changes and recheck in 1 month.  Patient presents back today for follow up with wife. Has reduced sodium considerably. Has noticed a drop in BP.  Home readings now averaging in 130s/80s.  Feels well. Denies chest pain, SOB, dizziness. Previously was worried about BP dropping too low and feeling sluggish but has not had any issues.   Current HTN meds:  Hydralazine 75mg  TID Olmesartan 40mg  daily Propranolol 160mg  daily  BP goal: <130/80   Wt Readings from Last 3 Encounters:  11/22/22 161 lb 3.2 oz (73.1 kg)  11/18/22 164 lb (74.4 kg)  05/23/22 155 lb 3.2 oz (70.4 kg)   BP Readings from Last 3 Encounters:  12/19/22 (!) 154/78  11/22/22 136/78  11/18/22 (!) 158/88   Pulse Readings from Last 3 Encounters:  11/22/22 60  11/18/22 62  05/23/22 65    Renal function: CrCl cannot be calculated (Unknown ideal weight.).  Past Medical History:  Diagnosis Date   Abdominal bruit 07/22/2010   Acute sinusitis 03/26/2015   Acute suppr otitis media w spon rupt ear drum, recur, r ear 04/06/2017   Angina pectoris 12/11/2008   Benign essential tremor 08/29/2017   Bilateral sensorineural hearing loss 11/30/2015   CAD S/P percutaneous coronary angioplasty 12/11/2008   LAD PCI '03,  Cath 2010 showed 40% LAD- medical Rx Myoview March 2020 negative for ischemia but positive for ST depression- Imdur added.  Formatting of this note might be different from the original. LAD PCI '03,  Cath 2010 showed 40% LAD- medical Rx Myoview  March 2020 negative for ischemia but positive for ST depression- Imdur added.  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 Echo May 2020- EF 60-65% with mild LVH  Last Assessment & Plan:  Well controlled, no changes to meds. Encouraged heart healthy diet such as the DASH diet and exercise as tolerated.   Eustachian tube dysfunction, bilateral 11/30/2015   History of colonic polyps 07/12/2010   History of GI bleed 04/23/2020   History of left mastoidectomy 02/20/2020   History of placement of ear tubes    Hyperlipidemia associated with type 2 diabetes mellitus 07/10/2007   Hyperthyroidism 07/12/2010   Hypothyroidism 07/10/2007   Internal hemorrhoids    Intractable episodic cluster headache 08/29/2017   Major depressive disorder 07/10/2007   Mastoiditis of both sides 09/21/2016  Migraine headache    Mild neurocognitive disorder due to multiple etiologies 12/25/2020   Non-insulin dependent type 2 diabetes mellitus 07/06/2010   Osteoarthritis of multiple joints 07/10/2007   Perennial allergic rhinitis 09/22/2016   Postprocedural urinary retention    Presbycusis of both ears 01/23/2018   Skin cancer, basal cell    Thrombocytopenia    TIA (transient ischemic attack) 2006   per patient's report. He was never officially given this diagnosis   Upper airway cough syndrome 09/25/2017    Current Outpatient Medications on File Prior to Visit  Medication Sig Dispense Refill   aspirin 81 MG tablet Take 81 mg by mouth daily.      azelastine (ASTELIN) 0.1 % nasal spray Place 1 spray into both nostrils daily as needed for rhinitis. Use in each nostril as directed     Biotin 1000 MCG tablet Take 1,000 mcg by mouth in the morning, at noon, and at bedtime.     Boswellia Serrata (BOSWELLIA PO) Take 500 mg by mouth 3 (three) times daily.     cholecalciferol (VITAMIN D) 25 MCG (1000 UNIT) tablet Take 3,000 Units by mouth 3 (three) times daily.     Cinnamon 500 MG capsule Take 1,000 mg by mouth 2 (two) times daily.     Coenzyme Q10 (COQ-10) 100 MG capsule Take 1 capsule (100 mg total) by mouth daily.     Cyanocobalamin (VITAMIN B-12) 1000 MCG SUBL Take 1,000 mcg by mouth daily.     Diclofenac Sodium 3 % GEL Qid prn (Patient taking differently: Apply 1 application  topically daily as needed (Arthritis).) 100 g 3   ezetimibe (ZETIA) 10 MG tablet TAKE ONE TABLET BY MOUTH DAILY 90 tablet 3   Ginger, Zingiber officinalis, (GINGER ROOT) 550 MG CAPS Take 550 mg by mouth 2 (two) times daily.     hydrALAZINE (APRESOLINE) 50 MG tablet Take 1.5 tablets (75 mg total) by mouth 3 (three) times daily. 270 tablet 5   levothyroxine (SYNTHROID) 112 MCG tablet Take 1 tablet (112 mcg total) by mouth daily before breakfast. 90 tablet 0   metFORMIN (GLUCOPHAGE) 500 MG tablet Take 1 tablet (500 mg total) by mouth 2 (two) times daily with a meal. 180 tablet 3   Multiple Vitamins-Minerals (THERAGRAN-M ADVANCED 50 PLUS PO) Take 1 tablet by mouth daily.     Nerve Stimulator (CEFALY KIT) DEVI Apply 1 application topically daily at 12 noon. Electrical migraine kit for prevention on treatment of migraine headache     olmesartan (BENICAR) 40 MG tablet Take 1 tablet (40 mg total) by mouth daily. 90 tablet 3   pantoprazole (PROTONIX) 40 MG tablet TAKE ONE TABLET BY MOUTH DAILY 90 tablet 1   propranolol ER (INDERAL LA) 160 MG SR capsule TAKE ONE CAPSULE BY MOUTH DAILY 90 capsule 1   simvastatin (ZOCOR) 40 MG tablet TAKE ONE TABLET BY MOUTH AT BEDTIME 90 tablet 1    tamsulosin (FLOMAX) 0.4 MG CAPS capsule Take 0.4 mg by mouth daily. 30 capsule    topiramate (TOPAMAX) 50 MG tablet TAKE ONE TABLET BY MOUTH EVERY NIGHT AT BEDTIME 90 tablet 1   No current facility-administered medications on file prior to visit.    Allergies  Allergen Reactions   Crestor [Rosuvastatin Calcium] Other (See Comments)    Severe joint and muscle pain   Nitrofurantoin Swelling    Ankles/legs SOB   Rosuvastatin Other (See Comments)    REACTION: REALLY BAD JOINT/MUSCLE PAIN   Sulfa Antibiotics Other (See Comments)  Severe crippling  joint and muscle pain and redness   Sulfamethoxazole-Trimethoprim Other (See Comments)    Joint and muscle pain   Sulfasalazine Other (See Comments)    Joint and muscle pain   Amlodipine Swelling   Codeine Other (See Comments)    Unknown   Hydrocodone-Acetaminophen Other (See Comments)    To strong vicodin    Other     Apples and tomatoes...... Nausea and migraines.       Assessment/Plan:  1. Hypertension -   HYPERTENSION CONTROL Vitals:   01/20/23 1532 01/20/23 1533  BP: (!) 151/75 (!) 150/77    The patient's blood pressure is elevated above target today.  In order to address the patient's elevated BP: A current anti-hypertensive medication was adjusted today.; The blood pressure is usually elevated in clinic.  Blood pressures monitored at home have been optimal.   Patient BP in room remains elevated over 130/80 however home readings have improved. Possible white coat HTN playing a role. Applauded for reducing sodium intake. Will increase hydralazine to  TID at this point and recommended to continue to monitor at home. Gave signs and symptoms of hypotension to watch for.  Has follow up with Dr [name] in 10 days. Will follow up in 4 weeks.  Continue olmesartan  daily Increase hydralazine to  TID Recheck in 4 weeks  Laural Golden, PharmD, BCACP, CDCES, CPP [address] [place], [place],  [address] Phone: [phone], Fax: [phone]    Laural Golden, PharmD, BCACP, CDCES, CPP [address], [address] [place], [place], [address] Phone: [phone], Fax: [phone]

## 2023-01-20 NOTE — Patient Instructions (Addendum)
It was nice seeing you again and happy birthday  Good work with reducing your salt intake  We would still like your blood pressure to be less than 130/80  Continue your olmesartan 40mg  daily We will increase your hydralazine to 100mg  three times daily  Continue monitoring your blood pressure at home  Let us know if you notice any signs or symptoms of low blood pressure (dizziness, lightheaded)  Laural Golden, PharmD, BCACP, CDCES, CPP [address] [place], [place], [address] Phone: [phone], Fax: [phone]

## 2023-01-22 ENCOUNTER — Other Ambulatory Visit: Payer: Self-pay | Admitting: Neurology

## 2023-01-22 ENCOUNTER — Other Ambulatory Visit: Payer: Self-pay | Admitting: Family Medicine

## 2023-01-30 ENCOUNTER — Ambulatory Visit: Payer: Medicare Other | Attending: Cardiology | Admitting: Cardiology

## 2023-01-30 ENCOUNTER — Encounter: Payer: Self-pay | Admitting: Cardiology

## 2023-01-30 VITALS — BP 124/60 | HR 72 | Ht 69.0 in | Wt 164.0 lb

## 2023-01-30 DIAGNOSIS — I2510 Atherosclerotic heart disease of native coronary artery without angina pectoris: Secondary | ICD-10-CM | POA: Insufficient documentation

## 2023-01-30 DIAGNOSIS — E785 Hyperlipidemia, unspecified: Secondary | ICD-10-CM | POA: Diagnosis not present

## 2023-01-30 DIAGNOSIS — I10 Essential (primary) hypertension: Secondary | ICD-10-CM | POA: Insufficient documentation

## 2023-01-30 NOTE — Patient Instructions (Signed)
    Follow-Up: At [facility], you and your health needs are our priority.  As part of our continuing mission to provide you with exceptional heart care, we have created designated Provider Care Teams.  These Care Teams include your primary Cardiologist (physician) and Advanced Practice Providers (APPs -  Physician Assistants and Nurse Practitioners) who all work together to provide you with the care you need, when you need it.  We recommend signing up for the patient portal called "MyChart".  Sign up information is provided on this After Visit Summary.  MyChart is used to connect with patients for Virtual Visits (Telemedicine).  Patients are able to view lab/test results, encounter notes, upcoming appointments, etc.  Non-urgent messages can be sent to your provider as well.   To learn more about what you can do with MyChart, go to [email or web address].    Your next appointment:   6 month(s)  Provider:   Brian Crenshaw, MD      

## 2023-01-31 DIAGNOSIS — R972 Elevated prostate specific antigen [PSA]: Secondary | ICD-10-CM | POA: Diagnosis not present

## 2023-02-01 DIAGNOSIS — Z9622 Myringotomy tube(s) status: Secondary | ICD-10-CM | POA: Diagnosis not present

## 2023-02-01 DIAGNOSIS — H9193 Unspecified hearing loss, bilateral: Secondary | ICD-10-CM | POA: Diagnosis not present

## 2023-02-01 DIAGNOSIS — Z79899 Other long term (current) drug therapy: Secondary | ICD-10-CM | POA: Diagnosis not present

## 2023-02-01 DIAGNOSIS — H663X3 Other chronic suppurative otitis media, bilateral: Secondary | ICD-10-CM | POA: Diagnosis not present

## 2023-02-01 DIAGNOSIS — H9212 Otorrhea, left ear: Secondary | ICD-10-CM | POA: Diagnosis not present

## 2023-02-01 DIAGNOSIS — H7112 Cholesteatoma of tympanum, left ear: Secondary | ICD-10-CM | POA: Diagnosis not present

## 2023-02-01 DIAGNOSIS — H7312 Chronic myringitis, left ear: Secondary | ICD-10-CM | POA: Diagnosis not present

## 2023-02-01 DIAGNOSIS — H7013 Chronic mastoiditis, bilateral: Secondary | ICD-10-CM | POA: Diagnosis not present

## 2023-02-01 DIAGNOSIS — Z9889 Other specified postprocedural states: Secondary | ICD-10-CM | POA: Diagnosis not present

## 2023-02-01 DIAGNOSIS — Z0110 Encounter for examination of ears and hearing without abnormal findings: Secondary | ICD-10-CM | POA: Diagnosis not present

## 2023-02-07 DIAGNOSIS — N401 Enlarged prostate with lower urinary tract symptoms: Secondary | ICD-10-CM | POA: Diagnosis not present

## 2023-02-07 DIAGNOSIS — R972 Elevated prostate specific antigen [PSA]: Secondary | ICD-10-CM | POA: Diagnosis not present

## 2023-02-07 DIAGNOSIS — R3912 Poor urinary stream: Secondary | ICD-10-CM | POA: Diagnosis not present

## 2023-02-22 ENCOUNTER — Other Ambulatory Visit: Payer: Self-pay | Admitting: Family Medicine

## 2023-02-22 DIAGNOSIS — E1165 Type 2 diabetes mellitus with hyperglycemia: Secondary | ICD-10-CM

## 2023-03-03 ENCOUNTER — Ambulatory Visit: Payer: Medicare Other | Attending: Cardiology | Admitting: Pharmacist

## 2023-03-03 ENCOUNTER — Encounter: Payer: Self-pay | Admitting: Pharmacist

## 2023-03-03 VITALS — BP 152/69 | HR 64

## 2023-03-03 DIAGNOSIS — I10 Essential (primary) hypertension: Secondary | ICD-10-CM | POA: Insufficient documentation

## 2023-03-03 NOTE — Patient Instructions (Addendum)
It was good seeing you again  We would like your blood pressure to be less than 130/80  It is higher in the office today than it was at home.  When you get home, tak your blood pressure and write down your reading.  Take your blood pressure again about 2 hours after your evening dose of hydralazine.  Message me your readings  If you can not find me on myChart, send it to Dr [name] and his nurse will forward it to me  Please continue your hydralazine 100mg  three times a day, olmesartan 40mg  a day, and propranolol 160mg  a day  Please let me know if you have any questions  Laural Golden, PharmD, BCACP, CDCES, CPP [address] [place], [place], [address] Phone: [phone], Fax: [phone]

## 2023-03-03 NOTE — Progress Notes (Signed)
Patient ID: [name]                 DOB: June 12, 1946                      MRN: [id number]     HPI: [name] is a 77 y.o. male referred [facility]. PMH is significant for HTN, CAD, T2DM, HLD, and angina. Seen 3 months ago and valsartan was switched to olmesartan.  At first visit it was uncovered that patient's diet was very high in salt. Ate many salty snacks as well as canned soups and also added salt to foods. Patient decided to work on lifestyle changes and recheck in 1 month.  At last visit he had significantly reduced sodium intak and had noticed blood pressure was trending downwards. Home readings now averaging in 130s/80s. Still above goal however and hydralazine was increased to 100mg  TID.  Had follow up with Dr [name] on 4/22 and BP was controlled at 124/60.  Patient and wife present today for follow up. Appointment at 3:30pm. Brought BP log with him and reading this afternoon at 1:00pm was 115/56 and patient felt tired. Does not remember what time he last took hydralazine.   Recent readings: 5/24: 115/56 5/23: 164/74 5/21: 134/59 5/15: 124/62 5/13: 122/64  Current HTN meds:  Hydralazine 100mg  TID Olmesartan 40mg  daily Propranolol 160mg  daily  BP goal: <130/80   Wt Readings from Last 3 Encounters:  11/22/22 161 lb 3.2 oz (73.1 kg)  11/18/22 164 lb (74.4 kg)  05/23/22 155 lb 3.2 oz (70.4 kg)   BP Readings from Last 3 Encounters:  12/19/22 (!) 154/78  11/22/22 136/78  11/18/22 (!) 158/88   Pulse Readings from Last 3 Encounters:  11/22/22 60  11/18/22 62  05/23/22 65    Renal function: CrCl cannot be calculated (Unknown ideal weight.).  Past Medical History:  Diagnosis Date   Abdominal bruit 07/22/2010   Acute sinusitis 03/26/2015   Acute suppr otitis media w spon rupt ear drum, recur, r ear 04/06/2017   Angina pectoris 12/11/2008   Benign essential tremor 08/29/2017   Bilateral sensorineural hearing loss 11/30/2015   CAD S/P  percutaneous coronary angioplasty 12/11/2008   LAD PCI '03,  Cath 2010 showed 40% LAD- medical Rx Myoview March 2020 negative for ischemia but positive for ST depression- Imdur added.  Formatting of this note might be different from the original. LAD PCI '03,  Cath 2010 showed 40% LAD- medical Rx Myoview March 2020 negative for ischemia but positive for ST depression- Imdur added.  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 Echo May 2020- EF 60-65% with mild LVH  Last Assessment & Plan:  Well controlled, no changes to meds. Encouraged heart healthy diet such as the DASH diet and exercise as tolerated.   Eustachian tube dysfunction, bilateral 11/30/2015   History of colonic polyps 07/12/2010   History of GI bleed 04/23/2020   History of left mastoidectomy 02/20/2020   History  of placement of ear tubes    Hyperlipidemia associated with type 2 diabetes mellitus 07/10/2007   Hyperthyroidism 07/12/2010   Hypothyroidism 07/10/2007   Internal hemorrhoids    Intractable episodic cluster headache 08/29/2017   Major depressive disorder 07/10/2007   Mastoiditis of both sides 09/21/2016   Migraine headache    Mild neurocognitive disorder due to multiple etiologies 12/25/2020   Non-insulin dependent type 2 diabetes mellitus 07/06/2010   Osteoarthritis of multiple joints 07/10/2007   Perennial allergic rhinitis 09/22/2016   Postprocedural urinary retention    Presbycusis of both ears 01/23/2018   Skin cancer, basal cell     Thrombocytopenia    TIA (transient ischemic attack) 2006   per patient's report. He was never officially given this diagnosis   Upper airway cough syndrome 09/25/2017    Current Outpatient Medications on File Prior to Visit  Medication Sig Dispense Refill   aspirin 81 MG tablet Take 81 mg by mouth daily.     azelastine (ASTELIN) 0.1 % nasal spray Place 1 spray into both nostrils daily as needed for rhinitis. Use in each nostril as directed     Biotin 1000 MCG tablet Take 1,000 mcg by mouth in the morning, at noon, and at bedtime.     Boswellia Serrata (BOSWELLIA PO) Take 500 mg by mouth 3 (three) times daily.     cholecalciferol (VITAMIN D) 25 MCG (1000 UNIT) tablet Take 3,000 Units by mouth 3 (three) times daily.     Cinnamon 500 MG capsule Take 1,000 mg by mouth 2 (two) times daily.     Coenzyme Q10 (COQ-10) 100 MG capsule Take 1 capsule (100 mg total) by mouth daily.     Cyanocobalamin (VITAMIN B-12) 1000 MCG SUBL Take 1,000 mcg by mouth daily.     Diclofenac Sodium 3 % GEL Qid prn (Patient taking differently: Apply 1 application  topically daily as needed (Arthritis).) 100 g 3   ezetimibe (ZETIA) 10 MG tablet TAKE ONE TABLET BY MOUTH DAILY 90 tablet 3   Ginger, Zingiber officinalis, (GINGER ROOT) 550 MG CAPS Take 550 mg by mouth 2 (two) times daily.     hydrALAZINE (APRESOLINE) 50 MG tablet Take 1.5 tablets (75 mg total) by mouth 3 (three) times daily. 270 tablet 5   levothyroxine (SYNTHROID) 112 MCG tablet Take 1 tablet (112 mcg total) by mouth daily before breakfast. 90 tablet 0   metFORMIN (GLUCOPHAGE) 500 MG tablet Take 1 tablet (500 mg total) by mouth 2 (two) times daily with a meal. 180 tablet 3   Multiple Vitamins-Minerals (THERAGRAN-M ADVANCED 50 PLUS PO) Take 1 tablet by mouth daily.     Nerve Stimulator (CEFALY KIT) DEVI Apply 1 application topically daily at 12 noon. Electrical migraine kit for prevention on treatment of migraine headache     olmesartan (BENICAR) 40 MG tablet  Take 1 tablet (40 mg total) by mouth daily. 90 tablet 3   pantoprazole (PROTONIX) 40 MG tablet TAKE ONE TABLET BY MOUTH DAILY 90 tablet 1   propranolol ER (INDERAL LA) 160 MG SR capsule TAKE ONE CAPSULE BY MOUTH DAILY 90 capsule 1   simvastatin (ZOCOR) 40 MG tablet TAKE ONE TABLET BY MOUTH AT BEDTIME 90 tablet 1   tamsulosin (FLOMAX) 0.4 MG CAPS capsule Take 0.4 mg by mouth daily. 30 capsule    topiramate (TOPAMAX) 50 MG tablet TAKE ONE TABLET BY MOUTH EVERY NIGHT AT BEDTIME 90 tablet 1   No current facility-administered medications on file prior to visit.  Allergies  Allergen Reactions   Crestor [Rosuvastatin Calcium] Other (See Comments)    Severe joint and muscle pain   Nitrofurantoin Swelling    Ankles/legs SOB   Rosuvastatin Other (See Comments)    REACTION: REALLY BAD JOINT/MUSCLE PAIN   Sulfa Antibiotics Other (See Comments)    Severe crippling  joint and muscle pain and redness   Sulfamethoxazole-Trimethoprim Other (See Comments)    Joint and muscle pain   Sulfasalazine Other (See Comments)    Joint and muscle pain   Amlodipine Swelling   Codeine Other (See Comments)    Unknown   Hydrocodone-Acetaminophen Other (See Comments)    To strong vicodin    Other     Apples and tomatoes...... Nausea and migraines.       Assessment/Plan:  1. Hypertension - HYPERTENSION CONTROL Vitals:   03/03/23 1547 03/03/23 1548  BP: (!) 153/76 (!) 152/69    The patient's blood pressure is elevated above target today.  In order to address the patient's elevated BP: Blood pressure will be monitored at home to determine if medication changes need to be made.   Patient BP in room 153/76 is above goal of <130/80. Home reading ~3 hours ago was much lower and patient was symptomatic. Having trouble remembering what time he took his medication.  Instructed patient to take blood pressure when he returns home and write it down. Recommended taking blood pressure again about 2 hours after  evening dose of hydralazine and write it down. Instructed to send me readings over myChart to see if medications need to be adjusted. Patient voiced understanding.  Continue: Hydralazine 100mg  TID Olmesartan 40mg  daily Propranolol 160mg  daily  Laural Golden, PharmD, BCACP, CDCES, CPP [address], [address] [place], [place], [address] Phone: [phone], Fax: [phone]

## 2023-03-24 DIAGNOSIS — N2581 Secondary hyperparathyroidism of renal origin: Secondary | ICD-10-CM | POA: Diagnosis not present

## 2023-03-24 DIAGNOSIS — D631 Anemia in chronic kidney disease: Secondary | ICD-10-CM | POA: Diagnosis not present

## 2023-03-24 DIAGNOSIS — N1831 Chronic kidney disease, stage 3a: Secondary | ICD-10-CM | POA: Diagnosis not present

## 2023-03-24 DIAGNOSIS — I129 Hypertensive chronic kidney disease with stage 1 through stage 4 chronic kidney disease, or unspecified chronic kidney disease: Secondary | ICD-10-CM | POA: Diagnosis not present

## 2023-03-24 LAB — LAB REPORT - SCANNED
Creatinine, POC: 164.6 mg/dL
EGFR: 43

## 2023-03-27 DIAGNOSIS — L821 Other seborrheic keratosis: Secondary | ICD-10-CM | POA: Diagnosis not present

## 2023-03-27 DIAGNOSIS — L570 Actinic keratosis: Secondary | ICD-10-CM | POA: Diagnosis not present

## 2023-03-27 DIAGNOSIS — D225 Melanocytic nevi of trunk: Secondary | ICD-10-CM | POA: Diagnosis not present

## 2023-03-27 DIAGNOSIS — Z08 Encounter for follow-up examination after completed treatment for malignant neoplasm: Secondary | ICD-10-CM | POA: Diagnosis not present

## 2023-03-27 DIAGNOSIS — L814 Other melanin hyperpigmentation: Secondary | ICD-10-CM | POA: Diagnosis not present

## 2023-03-27 DIAGNOSIS — Z85820 Personal history of malignant melanoma of skin: Secondary | ICD-10-CM | POA: Diagnosis not present

## 2023-03-27 DIAGNOSIS — Z85828 Personal history of other malignant neoplasm of skin: Secondary | ICD-10-CM | POA: Diagnosis not present

## 2023-04-18 ENCOUNTER — Other Ambulatory Visit: Payer: Self-pay | Admitting: Family Medicine

## 2023-04-18 ENCOUNTER — Other Ambulatory Visit: Payer: Self-pay | Admitting: Neurology

## 2023-04-18 DIAGNOSIS — E1169 Type 2 diabetes mellitus with other specified complication: Secondary | ICD-10-CM

## 2023-04-24 NOTE — Progress Notes (Unsigned)
NEUROLOGY FOLLOW UP OFFICE NOTE  YOUNES DEGEORGE [id number]  Assessment/Plan:   Migraine without aura,, without status migrainosus, not intractable Essential tremor Hypertension   Migraine prevention:  Propranlol ER 160mg  daily, topiramate 50mg  at bedtime, Cefaly.  Essential tremor management:  propranolol ER 160mg  daily, topiramate 50mg  at bedtime  Migraine rescue:  Cefaly, Tylenol  Limit use of pain relievers to no more than 2 days out of week to prevent risk of rebound or medication-overuse headache. Keep headache diary Follow up 12 months     Subjective:  GORDIE CRUMBY is a 77 year old right-handed male with osteoarthritis, hypertension, type 2 diabetes mellitus, hyperlipidemia and s/p mastoidectomy and tympanic membrane repair who follows up for migraines and essential tremor.  He is accompanied by his wife who supplements history.   UPDATE:  I  Migraine:  No more than 1 a month.  Never tried the [place] Rescue protocol:  Tylenol, Cefaly Current NSAIDS:  piroxiicam daily (for arthritis) Current analgesics:  no Current triptans:  no Current ergotamine:  no Current anti-emetic:  no Current muscle relaxants:  no Current anti-anxiolytic:  no Current sleep aide:  no Current Antihypertensive medications:  propranolol ER 160mg , olmesartan, hydralazine Current Antidepressant medications:  no Current Anticonvulsant medications:  topiramate 50mg  at bedtime Current anti-CGRP:  no Current Vitamins/Herbal/Supplements: Boswellia Extract, Ginger root, coenzyme Q 10 Current Antihistamines/Decongestants: Flonase Other therapy: Cephaly (once a day).  Treat acutely with ginger tea and rest.   Caffeine: 2 to 3 cups of coffee daily Alcohol: No Smoker: No Diet: Hydrates.  No soda. Exercise: Yes Depression: Stable; Anxiety: Stable Other pain:  no Sleep hygiene: He wakes up every hour to urinate   II Essential Tremor:  He is taking propranolol ER 160 mg daily and topiramate 50mg   daily.  Overall stable.  Has to do things two-handed now.  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         HISTORY: I Migraine:  Onset: In his 35s or 30s.  They would occur once or twice a year.  In September, they started to occur once or twice a week and then almost daily.  Over the past couple of weeks (following tympanostomy tube placement), they have occurred about once a week. Location:  Top of head Quality:  explode Initial Intensity:  10/10 Aura:  Flashing lights/colors in vision Prodrome:  no Postdrome:  Hangover effect for up to 5 days Associated symptoms: Nausea, photophobia, phonophobia.  Denies unilateral numbness and weakness. Initial Duration:  Several hours Initial Frequency:   In September, they started to occur once or twice a week and then almost daily.  Over the past couple of weeks (following tympanostomy tube placement), they have occurred about once a week. Triggers: Emotional stress, sound, NTG, light, wine, eye strain Relieving factors: Sleep Activity:  Needs to lay down   Past NSAIDS:  Celebrex Past analgesics:  no Past abortive triptans:  Sumatriptan (stopped due to CAD). Past muscle relaxants:  no Past anti-emetic:  no Past antihypertensive medications:  none Past antidepressant medications:  Prozac, amitriptyline Past anticonvulsant medications:  no Past vitamins/Herbal/Supplements:  no Other past therapies:  Biofeedback, relaxation therapy   MRI of brain without contrast from 08/11/16 showed mild chronic small vessel ischemic changes but no acute stroke, bleed or mass lesion.  However, it did reveal mild mastoiditis.  He saw ENT and had tympanostomy tube placement on 09/22/16.  Due to worsening headaches, he had an MRI brain with  and without contrast was performed on 01/02/2020 which showed no acute intracranial abnormality. History of double vision.  Had prisms made for his glasses which helped.   II  Essential Tremor: For over 10  years, he has had a tremor, particularly in his right hand.  It has become more noticeable.  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  There is no known family history.  III  Memory Deficits: Neuropsychological evaluation on 12/25/2020 showed evidence of mild neurocognitive disorder, however it may be related to medication (topiramate) or migraines but at this time not convincing for a neurodegenerative disease.  He notes that he sometimes may have word-finding issues which can be a problem during conversation.      PAST MEDICAL HISTORY: Past Medical History:  Diagnosis Date   Abdominal bruit 07/22/2010   Acute sinusitis 03/26/2015   Acute suppr otitis media w spon rupt ear drum, recur, r ear 04/06/2017   Angina pectoris 12/11/2008   Benign essential tremor 08/29/2017   Bilateral sensorineural hearing loss 11/30/2015   CAD S/P percutaneous coronary angioplasty 12/11/2008   LAD PCI '03,  Cath 2010 showed 40% LAD- medical Rx Myoview March 2020 negative for ischemia but positive for ST depression- Imdur added.  Formatting of this note might be different from the original. LAD PCI '03,  Cath 2010 showed 40% LAD- medical Rx Myoview March 2020 negative for ischemia but positive for ST depression- Imdur added.  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 Echo May 2020- EF 60-65% with mild LVH  Last Assessment & Plan:  Well controlled, no changes to meds. Encouraged heart healthy diet such as the DASH diet and  exercise as tolerated.   Eustachian tube dysfunction, bilateral 11/30/2015   History of colonic polyps 07/12/2010   History of GI bleed 04/23/2020   History of left mastoidectomy 02/20/2020   History of placement of ear tubes    Hyperlipidemia associated with type 2 diabetes mellitus 07/10/2007   Hyperthyroidism 07/12/2010   Hypothyroidism 07/10/2007   Internal hemorrhoids    Intractable episodic cluster headache 08/29/2017   Major depressive disorder 07/10/2007   Mastoiditis of both sides 09/21/2016   Migraine headache    Mild neurocognitive disorder due to multiple etiologies 12/25/2020   Non-insulin dependent type 2 diabetes mellitus 07/06/2010   Osteoarthritis of multiple joints 07/10/2007   Perennial allergic rhinitis 09/22/2016   Postprocedural urinary retention    Presbycusis of both ears 01/23/2018   Skin cancer, basal cell    Thrombocytopenia    TIA (transient ischemic attack) 2006   per patient's report. He was never officially given this diagnosis   Upper airway cough syndrome 09/25/2017    MEDICATIONS: Current Outpatient Medications on File Prior to Visit  Medication Sig Dispense Refill   aspirin 81 MG tablet Take 81 mg by mouth daily.     azelastine (ASTELIN) 0.1 % nasal spray Place 1 spray into both nostrils daily as needed for rhinitis. Use in each nostril as directed  Biotin 1000 MCG tablet Take 1,000 mcg by mouth in the morning, at noon, and at bedtime.     Boswellia Serrata (BOSWELLIA PO) Take 500 mg by mouth 3 (three) times daily.     cholecalciferol (VITAMIN D) 25 MCG (1000 UNIT) tablet Take 3,000 Units by mouth 3 (three) times daily.     Cinnamon 500 MG capsule Take 1,000 mg by mouth 2 (two) times daily.     Coenzyme Q10 (COQ-10) 100 MG capsule Take 1 capsule (100 mg total) by mouth daily.     Cyanocobalamin (VITAMIN B-12) 1000 MCG SUBL Take 1,000 mcg by mouth daily.     Diclofenac Sodium 3 % GEL Qid prn (Patient taking differently: Apply 1 application   topically daily as needed (Arthritis).) 100 g 3   ezetimibe (ZETIA) 10 MG tablet TAKE ONE TABLET BY MOUTH DAILY 90 tablet 3   Ginger, Zingiber officinalis, (GINGER ROOT) 550 MG CAPS Take 550 mg by mouth 2 (two) times daily.     hydrALAZINE (APRESOLINE) 100 MG tablet Take 1 tablet (100 mg total) by mouth 3 (three) times daily. 90 tablet 5   levothyroxine (SYNTHROID) 112 MCG tablet Take 1 tablet (112 mcg total) by mouth daily before breakfast. 90 tablet 1   metFORMIN (GLUCOPHAGE) 500 MG tablet TAKE 1 TABLET BY MOUTH TWICE A DAY WITH MEALS 180 tablet 3   Multiple Vitamins-Minerals (THERAGRAN-M ADVANCED 50 PLUS PO) Take 1 tablet by mouth daily.     Nerve Stimulator (CEFALY KIT) DEVI Apply 1 application topically daily at 12 noon. Electrical migraine kit for prevention on treatment of migraine headache     olmesartan (BENICAR) 40 MG tablet Take 1 tablet (40 mg total) by mouth daily. 90 tablet 3   pantoprazole (PROTONIX) 40 MG tablet TAKE 1 TABLET BY MOUTH DAILY 90 tablet 1   propranolol ER (INDERAL LA) 160 MG SR capsule TAKE 1 CAPSULE BY MOUTH DAILY 90 capsule 1   simvastatin (ZOCOR) 40 MG tablet TAKE 1 TABLET BY MOUTH AT BEDTIME 90 tablet 1   tamsulosin (FLOMAX) 0.4 MG CAPS capsule Take 0.4 mg by mouth daily. 30 capsule    topiramate (TOPAMAX) 50 MG tablet TAKE 1 TABLET BY MOUTH EVERY NIGHT AT BEDTIME 90 tablet 1   No current facility-administered medications on file prior to visit.    ALLERGIES: Allergies  Allergen Reactions   Crestor [Rosuvastatin Calcium] Other (See Comments)    Severe joint and muscle pain   Nitrofurantoin Swelling    Ankles/legs SOB   Rosuvastatin Other (See Comments)    REACTION: REALLY BAD JOINT/MUSCLE PAIN   Sulfa Antibiotics Other (See Comments)    Severe crippling  joint and muscle pain and redness   Sulfamethoxazole-Trimethoprim Other (See Comments)    Joint and muscle pain   Sulfasalazine Other (See Comments)    Joint and muscle pain   Amlodipine Swelling    Codeine Other (See Comments)    Unknown   Hydrocodone-Acetaminophen Other (See Comments)    To strong vicodin    Other     Apples and tomatoes...... Nausea and migraines.      FAMILY HISTORY: Family History  Problem Relation Age of Onset   Heart disease Mother    Alzheimer's disease Mother    Heart disease Father        triple by pass   Hypertension Brother        Prostate Cancer   Prostate cancer Brother    Hypertension Brother    Hypertension Brother  COPD Brother    Colon cancer Neg Hx    Colon polyps Neg Hx    Rectal cancer Neg Hx    Stomach cancer Neg Hx    Esophageal cancer Neg Hx       Objective:  Blood pressure 130/62, pulse 79, height 5\' 8"  (1.727 m), weight 155 lb 3.2 oz (70.4 kg), SpO2 96%. General: No acute distress.  Patient appears well-groomed.   Head:  Normocephalic/atraumatic Eyes:  Fundi examined but not visualized Neck: supple, no paraspinal tenderness, full range of motion Heart:  Regular rate and rhythm Neurological Exam: alert and oriented.  Speech fluent and not dysarthric, language intact.  CN II-XII intact. Bulk and tone normal, muscle strength 5/5 throughout.  Tremor not appreciated.  Sensation to light touch intact.  Deep tendon reflexes 2+ throughout.  Finger to nose testing intact.  Gait normal, Romberg negative.   Shon Millet, DO  CC: Seabron Spates, DO

## 2023-04-25 ENCOUNTER — Ambulatory Visit (INDEPENDENT_AMBULATORY_CARE_PROVIDER_SITE_OTHER): Payer: Medicare Other | Admitting: Neurology

## 2023-04-25 ENCOUNTER — Encounter: Payer: Self-pay | Admitting: Neurology

## 2023-04-25 VITALS — BP 130/62 | HR 79 | Ht 68.0 in | Wt 155.2 lb

## 2023-04-25 DIAGNOSIS — G250 Essential tremor: Secondary | ICD-10-CM | POA: Diagnosis not present

## 2023-04-25 DIAGNOSIS — G43109 Migraine with aura, not intractable, without status migrainosus: Secondary | ICD-10-CM

## 2023-04-25 MED ORDER — PROPRANOLOL HCL ER 160 MG PO CP24: 160.0000 mg | ORAL_CAPSULE | Freq: Every day | ORAL | 3 refills | Status: DC

## 2023-04-25 NOTE — Patient Instructions (Signed)
Topiramate and propranolol Cefaly

## 2023-05-10 ENCOUNTER — Encounter (INDEPENDENT_AMBULATORY_CARE_PROVIDER_SITE_OTHER): Payer: Self-pay

## 2023-05-23 ENCOUNTER — Encounter: Payer: Self-pay | Admitting: Family Medicine

## 2023-05-23 ENCOUNTER — Ambulatory Visit: Payer: Medicare Other | Admitting: Family Medicine

## 2023-05-23 VITALS — BP 136/70 | HR 60 | Temp 97.9°F | Resp 18 | Ht 68.0 in | Wt 155.2 lb

## 2023-05-23 DIAGNOSIS — Z7984 Long term (current) use of oral hypoglycemic drugs: Secondary | ICD-10-CM

## 2023-05-23 DIAGNOSIS — E1169 Type 2 diabetes mellitus with other specified complication: Secondary | ICD-10-CM

## 2023-05-23 DIAGNOSIS — E1165 Type 2 diabetes mellitus with hyperglycemia: Secondary | ICD-10-CM | POA: Diagnosis not present

## 2023-05-23 DIAGNOSIS — R351 Nocturia: Secondary | ICD-10-CM

## 2023-05-23 DIAGNOSIS — E785 Hyperlipidemia, unspecified: Secondary | ICD-10-CM

## 2023-05-23 DIAGNOSIS — E039 Hypothyroidism, unspecified: Secondary | ICD-10-CM

## 2023-05-23 DIAGNOSIS — D696 Thrombocytopenia, unspecified: Secondary | ICD-10-CM | POA: Diagnosis not present

## 2023-05-23 DIAGNOSIS — N401 Enlarged prostate with lower urinary tract symptoms: Secondary | ICD-10-CM

## 2023-05-23 DIAGNOSIS — I10 Essential (primary) hypertension: Secondary | ICD-10-CM

## 2023-05-23 MED ORDER — LEVOTHYROXINE SODIUM 112 MCG PO TABS: 112.0000 ug | ORAL_TABLET | Freq: Every day | ORAL | 3 refills | Status: DC

## 2023-05-23 NOTE — Progress Notes (Signed)
Established Patient Office Visit  Subjective   Patient ID: [name], male    DOB: [birth year]  Age: 77 y.o. MRN: [id number]  Chief Complaint  Patient presents with   Hyperlipidemia   Hypothyroidism   Follow-up    HPI Discussed the use of AI scribe software for clinical note transcription with the patient, who gave verbal consent to proceed.  History of Present Illness   The patient presents for a routine follow-up. He reports a decrease in hearing, which he attributes to his new hearing aids. He mentions that his wife's voice is particularly difficult to hear since she has started speaking more softly after getting her own hearing aids. He is considering getting a device called a Roger to help with hearing in group settings.  The patient also mentions a decrease in blood pressure, which he attributes to a low-sodium diet. He has been eating frozen meals for lunch, but finds the sodium content to be too high. He has dietary restrictions due to allergies, which makes finding suitable meals challenging.  The patient reports no pain and no need for medication refills. He mentions that his father lived to 14 and was active until his death, which he finds inspiring. He expresses a desire to maintain his own health and activity level as he ages.      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   Drug-induced neutropenia 11/10/2020   History of GI bleed 04/23/2020   History of left mastoidectomy  02/20/2020   Chronic mastoiditis 02/11/2020   Chronic right shoulder pain 10/30/2019   Edema 04/03/2019   Dysphagia 10/18/2018   Otitis media 05/02/2018   Chronic serous otitis media 04/19/2018   Presbycusis of both ears 01/23/2018   Upper airway cough syndrome 09/25/2017   Snoring 08/29/2017   Benign essential tremor 08/29/2017   Nocturia more than twice per night 08/29/2017   Intractable episodic cluster headache 08/29/2017   Chronic tubotympanic suppurative otitis media, bilateral 05/05/2017   Acute suppr otitis media w spon rupt ear drum, recur, r ear 04/06/2017   Perennial allergic rhinitis 09/22/2016   Mastoiditis of both sides 09/21/2016   Chronic pansinusitis 09/21/2016   Foreign body of ear, left, initial encounter 07/25/2016   Eustachian tube dysfunction, bilateral 11/30/2015   Bilateral sensorineural hearing loss 11/30/2015   Heartburn 09/16/2014   Thrombocytopenia (HCC) 05/08/2013   Abdominal bruit 07/22/2010   Hyperthyroidism 07/12/2010   Internal hemorrhoids 07/12/2010   Hemorrhage of rectum and anus 07/12/2010   History of colonic polyps 07/12/2010   Type 2 diabetes mellitus with hyperglycemia, without long-term current use of insulin (HCC) 07/06/2010   Essential hypertension 12/11/2008   Angina pectoris 12/11/2008   CAD S/P percutaneous coronary angioplasty 12/11/2008   Hypothyroidism 07/10/2007   Hyperlipidemia associated with type 2 diabetes mellitus (HCC) 07/10/2007   Major depressive disorder 07/10/2007   Osteoarthritis of multiple joints 07/10/2007   Past Medical History:  Diagnosis Date   Abdominal bruit 07/22/2010   Acute sinusitis 03/26/2015   Acute suppr otitis media  w spon rupt ear drum, recur, r ear 04/06/2017   Angina pectoris 12/11/2008   Benign essential tremor 08/29/2017   Bilateral sensorineural hearing loss 11/30/2015   CAD S/P percutaneous coronary angioplasty 12/11/2008   LAD PCI '03,  Cath 2010 showed 40% LAD- medical Rx Myoview March  2020 negative for ischemia but positive for ST depression- Imdur added.  Formatting of this note might be different from the original. LAD PCI '03,  Cath 2010 showed 40% LAD- medical Rx Myoview March 2020 negative for ischemia but positive for ST depression- Imdur added.  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 Echo May 2020- EF 60-65% with mild LVH  Last Assessment & Plan:  Well controlled, no changes to meds. Encouraged heart healthy diet such as the DASH diet and exercise as tolerated.   Eustachian tube dysfunction, bilateral 11/30/2015   History of colonic polyps 07/12/2010   History of GI bleed 04/23/2020   History of left mastoidectomy 02/20/2020   History of placement of ear tubes    Hyperlipidemia associated with type 2 diabetes mellitus 07/10/2007   Hyperthyroidism 07/12/2010   Hypothyroidism 07/10/2007   Internal hemorrhoids    Intractable episodic cluster headache 08/29/2017   Major depressive disorder 07/10/2007   Mastoiditis of both sides 09/21/2016   Migraine headache    Mild neurocognitive disorder due to multiple etiologies 12/25/2020   Non-insulin dependent type 2 diabetes mellitus 07/06/2010   Osteoarthritis of multiple joints 07/10/2007   Perennial allergic rhinitis 09/22/2016   Postprocedural urinary retention    Presbycusis of both ears 01/23/2018   Skin cancer, basal cell    Thrombocytopenia    TIA (transient ischemic attack) 2006   per patient's report. He was never officially given this  diagnosis   Upper airway cough syndrome 09/25/2017   Past Surgical History:  Procedure Laterality Date   COLONOSCOPY  2011   coronary artery disease status post placement     of drug-eluting stent in the LAD in 2003,eEF 65% then   DEBRIDEMENT AND CLOSURE WOUND N/A 12/08/2020   Procedure: CLOSURE LEFT POST AURICULAR SCALP WOUND;  Surgeon: Glenna Fellows, MD;  Location: [facility];  Service: Plastics;  Laterality: N/A;   esophogeal dilation     fatty tissue removed     from neck 2003   HERNIA REPAIR  2007   mastoidectomy with tympanoplasty     MELANOMA EXCISION Left 11/17/2020   Procedure: WIDE LOCAL EXCISION, ADVANCED FLAP CLOSURE LEFT POSTERIOR EAR MELANOMA;  Surgeon: Almond Lint, MD;  Location:  SURGERY CENTER;  Service: General;  Laterality: Left;   MELANOMA EXCISION Left 12/01/2020   Procedure: RE-EXCISION LEFT POSTERIOR AURICULAR MELANOMA WITH TEMPORARY CLOSURE;  Surgeon: Almond Lint, MD;  Location: [facility] OR;  Service: General;  Laterality: Left;  60 MIN TOTAL   POLYPECTOMY  2011   +TA   right toe bone spur surgery     SKIN FULL THICKNESS GRAFT Left 12/08/2020   Procedure: Full thickness skin graft from left upper arm;  Surgeon:  Glenna Fellows, MD;  Location: [facility];  Service: Plastics;  Laterality: Left;   THYROIDECTOMY, PARTIAL  07/2002   right thyroid   TONSILLECTOMY     UPPER GI ENDOSCOPY     Social History   Tobacco Use   Smoking status: Former    Current packs/day: 0.00    Types: Cigarettes    Start date: 10/10/1960    Quit date: 10/10/1965    Years since quitting: 57.6   Smokeless tobacco: Never  Vaping Use   Vaping status: Never Used  Substance Use Topics   Alcohol use: No   Drug use: No   Social History   Socioeconomic History   Marital status: Married    Spouse name: Charleen   Number of children: 0   Years of education: 14   Highest education level: Associate degree: occupational, Scientist, product/process development, or vocational  program  Occupational History   Occupation: retired    Associate Professor: RETIRED    Comment: accountant  Tobacco Use   Smoking status: Former    Current packs/day: 0.00    Types: Cigarettes    Start date: 10/10/1960    Quit date: 10/10/1965    Years since quitting: 57.6   Smokeless tobacco: Never  Vaping Use   Vaping status: Never Used  Substance and Sexual Activity   Alcohol use: No   Drug use: No   Sexual activity: Yes    Partners: Female  Other Topics Concern   Not on file  Social History Narrative   Pt lives with spouse at private home in 2 story home- he has no children- right handed- he drinks coffee daily, tea sometimes, soda not all the time. Exercise-- treadmill, stationary bike   Right handed   Social Determinants of Health   Financial Resource Strain: Low Risk  (11/08/2021)   Overall Financial Resource Strain (CARDIA)    Difficulty of Paying Living Expenses: Not hard at all  Food Insecurity: No Food Insecurity (11/10/2022)   Hunger Vital Sign    Worried About Running Out of Food in the Last Year: Never true    Ran Out of Food in the Last Year: Never true  Transportation Needs: No Transportation Needs (11/10/2022)   PRAPARE - Administrator, Civil Service (Medical): No    Lack of Transportation (Non-Medical): No  Physical Activity: Sufficiently Active (11/08/2021)   Exercise Vital Sign    Days of Exercise per Week: 7 days    Minutes of Exercise per Session: 30 min  Stress: No Stress Concern Present (11/08/2021)   [employer] of Occupational Health - Occupational Stress Questionnaire    Feeling of Stress : Not at all  Social Connections: Moderately Integrated (11/08/2021)   Social Connection and Isolation Panel [NHANES]    Frequency of Communication with Friends and Family: Three times a week    Frequency of Social Gatherings with Friends and Family: More than three times a week    Attends Religious Services: More than 4 times per year    Active Member of  [employer] or Organizations: No    Attends Banker Meetings: Never    Marital Status: Married  Catering manager Violence: Not At Risk (11/10/2022)   Humiliation, Afraid, Rape, and Kick questionnaire    Fear of Current or Ex-Partner: No    Emotionally Abused: No    Physically Abused: No    Sexually Abused: No   Family Status  Relation Name Status   Mother  Deceased at age 48  Father  Deceased at age 88   Brother  Alive   Brother  Alive   Brother  Deceased at age 75   Neg Hx  (Not Specified)  No partnership data on file   Family History  Problem Relation Age of Onset   Heart disease Mother    Alzheimer's disease Mother    Heart disease Father        triple by pass   Hypertension Brother        Prostate Cancer   Prostate cancer Brother    Hypertension Brother    Hypertension Brother    COPD Brother    Colon cancer Neg Hx    Colon polyps Neg Hx    Rectal cancer Neg Hx    Stomach cancer Neg Hx    Esophageal cancer Neg Hx    Allergies  Allergen Reactions   Crestor [Rosuvastatin Calcium] Other (See Comments)    Severe joint and muscle pain   Nitrofurantoin Swelling    Ankles/legs SOB   Rosuvastatin Other (See Comments)    REACTION: REALLY BAD JOINT/MUSCLE PAIN   Sulfa Antibiotics Other (See Comments)    Severe crippling  joint and muscle pain and redness   Sulfamethoxazole-Trimethoprim Other (See Comments)    Joint and muscle pain   Sulfasalazine Other (See Comments)    Joint and muscle pain   Amlodipine Swelling   Codeine Other (See Comments)    Unknown   Hydrocodone-Acetaminophen Other (See Comments)    To strong vicodin    Other     Apples and tomatoes...... Nausea and migraines.        Review of Systems  Constitutional:  Negative for chills, fever and malaise/fatigue.  HENT:  Negative for congestion and hearing loss.   Eyes:  Negative for blurred vision and discharge.  Respiratory:  Negative for cough, sputum production and shortness of  breath.   Cardiovascular:  Negative for chest pain, palpitations and leg swelling.  Gastrointestinal:  Negative for abdominal pain, blood in stool, constipation, diarrhea, heartburn, nausea and vomiting.  Genitourinary:  Negative for dysuria, frequency, hematuria and urgency.  Musculoskeletal:  Negative for back pain, falls and myalgias.  Skin:  Negative for rash.  Neurological:  Negative for dizziness, sensory change, loss of consciousness, weakness and headaches.  Endo/Heme/Allergies:  Negative for environmental allergies. Does not bruise/bleed easily.  Psychiatric/Behavioral:  Negative for depression and suicidal ideas. The patient is not nervous/anxious and does not have insomnia.       Objective:     BP 136/70 (BP Location: Left Arm, Patient Position: Sitting, Cuff Size: Normal)   Pulse 60   Temp 97.9 F (36.6 C) (Oral)   Resp 18   Ht 5\' 8"  (1.727 m)   Wt 155 lb 3.2 oz (70.4 kg)   SpO2 96%   BMI 23.60 kg/m  BP Readings from Last 3 Encounters:  05/23/23 136/70  04/25/23 130/62  03/03/23 (!) 152/69   Wt Readings from Last 3 Encounters:  05/23/23 155 lb 3.2 oz (70.4 kg)  04/25/23 155 lb 3.2 oz (70.4 kg)  01/30/23 164 lb (74.4 kg)   SpO2 Readings from Last 3 Encounters:  05/23/23 96%  04/25/23 96%  03/03/23 97%      Physical Exam Vitals and nursing note reviewed.  Constitutional:      General: He is not in acute distress.    Appearance: Normal appearance. He is well-developed.  HENT:     Head: Normocephalic and atraumatic.  Eyes:  General: No scleral icterus.       Right eye: No discharge.        Left eye: No discharge.  Cardiovascular:     Rate and Rhythm: Normal rate and regular rhythm.     Heart sounds: No murmur heard. Pulmonary:     Effort: Pulmonary effort is normal. No respiratory distress.     Breath sounds: Normal breath sounds.  Musculoskeletal:        General: Normal range of motion.     Cervical back: Normal range of motion and neck supple.      Right lower leg: No edema.     Left lower leg: No edema.  Skin:    General: Skin is warm and dry.  Neurological:     General: No focal deficit present.     Mental Status: He is alert and oriented to person, place, and time.  Psychiatric:        Mood and Affect: Mood normal.        Behavior: Behavior normal.        Thought Content: Thought content normal.        Judgment: Judgment normal.      No results found for any visits on 05/23/23.  Last CBC Lab Results  Component Value Date   WBC 5.8 11/23/2022   HGB 15.5 11/23/2022   HCT 45.1 11/23/2022   MCV 93.5 11/23/2022   MCH 32.9 11/19/2021   RDW 12.9 11/23/2022   PLT 141.0 (L) 11/23/2022   Last metabolic panel Lab Results  Component Value Date   GLUCOSE 112 (H) 12/02/2022   NA 142 12/02/2022   K 4.2 12/02/2022   CL 106 12/02/2022   CO2 22 12/02/2022   BUN 24 12/02/2022   CREATININE 1.46 (H) 12/02/2022   EGFR 43.0 03/24/2023   CALCIUM 9.2 12/02/2022   PROT 7.1 11/23/2022   ALBUMIN 4.1 11/23/2022   LABGLOB 1.9 07/01/2019   AGRATIO 1.6 07/01/2019   BILITOT 0.7 11/23/2022   ALKPHOS 53 11/23/2022   AST 16 11/23/2022   ALT 16 11/23/2022   ANIONGAP 9 11/19/2021   Last lipids Lab Results  Component Value Date   CHOL 108 11/23/2022   HDL 50.30 11/23/2022   LDLCALC 48 11/23/2022   TRIG 45.0 11/23/2022   CHOLHDL 2 11/23/2022   Last hemoglobin A1c Lab Results  Component Value Date   HGBA1C 5.3 11/23/2022   Last thyroid functions Lab Results  Component Value Date   TSH 1.60 11/23/2022   Last vitamin D Lab Results  Component Value Date   VD25OH 51.82 10/23/2019   Last vitamin B12 and Folate Lab Results  Component Value Date   VITAMINB12 566 11/23/2011   FOLATE >20.0 11/23/2011      The ASCVD Risk score (Arnett DK, et al., 2019) failed to calculate for the following reasons:   The valid total cholesterol range is 130 to 320 mg/dL    Assessment & Plan:   Problem List Items Addressed This  Visit       Unprioritized   Type 2 diabetes mellitus with hyperglycemia, without long-term current use of insulin (HCC)    hgba1c to be checked, minimize simple carbs. Increase exercise as tolerated. Continue current meds       Relevant Orders   Hemoglobin A1c   Microalbumin / creatinine urine ratio   Thrombocytopenia (HCC)    Per hematology      Hypothyroidism - Primary    Per endo  Relevant Medications   levothyroxine (SYNTHROID) 112 MCG tablet   Other Relevant Orders   TSH   Hyperlipidemia associated with type 2 diabetes mellitus (HCC)    Tolerating statin, encouraged heart healthy diet, avoid trans fats, minimize simple carbs and saturated fats. Increase exercise as tolerated       Relevant Orders   Comprehensive metabolic panel   Lipid panel   Essential hypertension    Well controlled, no changes to meds. Encouraged heart healthy diet such as the DASH diet and exercise as tolerated.        Other Visit Diagnoses     Primary hypertension       Relevant Orders   CBC with Differential/Platelet   Comprehensive metabolic panel   Lipid panel   BPH associated with nocturia       Relevant Orders   PSA     Assessment and Plan    Hypertension Blood pressure well controlled. No changes in medication required. -Continue current regimen.  Hearing Loss Patient reports worsening hearing despite new hearing aids. Discussed potential use of a "Roger" device to improve hearing in group settings. -Consider obtaining a "Roger" device for improved hearing in group settings.  Hyperlipidemia Patient had breakfast 2-3 hours prior to visit. Labs to be drawn at [facility]. -Draw labs at [facility] Maintenance Up to date with most vaccinations. Discussed optional newer pneumonia vaccine. -Consider newer pneumonia vaccine, decision deferred to patient's wife.  Diabetes No recent blood glucose checks. No changes in vision per annual eye exam. -Continue current  management.        Return in about 6 months (around 11/23/2023), or if symptoms worsen or fail to improve.    Donato Schultz, DO

## 2023-05-23 NOTE — Assessment & Plan Note (Signed)
Well controlled, no changes to meds. Encouraged heart healthy diet such as the DASH diet and exercise as tolerated.  °

## 2023-05-23 NOTE — Patient Instructions (Signed)

## 2023-05-23 NOTE — Assessment & Plan Note (Signed)
Per endo °

## 2023-05-23 NOTE — Assessment & Plan Note (Signed)
Per hematology 

## 2023-05-23 NOTE — Assessment & Plan Note (Signed)
Tolerating statin, encouraged heart healthy diet, avoid trans fats, minimize simple carbs and saturated fats. Increase exercise as tolerated 

## 2023-05-23 NOTE — Assessment & Plan Note (Signed)
hgba1c to be checked, minimize simple carbs. Increase exercise as tolerated. Continue current meds  

## 2023-05-24 ENCOUNTER — Other Ambulatory Visit (INDEPENDENT_AMBULATORY_CARE_PROVIDER_SITE_OTHER): Payer: Medicare Other

## 2023-05-24 ENCOUNTER — Other Ambulatory Visit: Payer: Self-pay

## 2023-05-24 DIAGNOSIS — E1169 Type 2 diabetes mellitus with other specified complication: Secondary | ICD-10-CM

## 2023-05-24 DIAGNOSIS — R351 Nocturia: Secondary | ICD-10-CM | POA: Diagnosis not present

## 2023-05-24 DIAGNOSIS — N401 Enlarged prostate with lower urinary tract symptoms: Secondary | ICD-10-CM | POA: Diagnosis not present

## 2023-05-24 DIAGNOSIS — I10 Essential (primary) hypertension: Secondary | ICD-10-CM | POA: Diagnosis not present

## 2023-05-24 DIAGNOSIS — E1165 Type 2 diabetes mellitus with hyperglycemia: Secondary | ICD-10-CM

## 2023-05-24 DIAGNOSIS — E785 Hyperlipidemia, unspecified: Secondary | ICD-10-CM

## 2023-05-24 DIAGNOSIS — D696 Thrombocytopenia, unspecified: Secondary | ICD-10-CM

## 2023-05-24 DIAGNOSIS — E039 Hypothyroidism, unspecified: Secondary | ICD-10-CM | POA: Diagnosis not present

## 2023-05-24 LAB — COMPREHENSIVE METABOLIC PANEL
ALT: 15 U/L (ref 0–53)
AST: 14 U/L (ref 0–37)
Albumin: 4.2 g/dL (ref 3.5–5.2)
Alkaline Phosphatase: 57 U/L (ref 39–117)
BUN: 27 mg/dL — ABNORMAL HIGH (ref 6–23)
CO2: 24 mEq/L (ref 19–32)
Calcium: 9 mg/dL (ref 8.4–10.5)
Chloride: 106 mEq/L (ref 96–112)
Creatinine, Ser: 1.37 mg/dL (ref 0.40–1.50)
GFR: 49.12 mL/min — ABNORMAL LOW (ref >60.00)
Glucose, Bld: 107 mg/dL — ABNORMAL HIGH (ref 70–99)
Potassium: 4.1 mEq/L (ref 3.5–5.1)
Sodium: 136 mEq/L (ref 135–145)
Total Bilirubin: 0.8 mg/dL (ref 0.2–1.2)
Total Protein: 6.9 g/dL (ref 6.0–8.3)

## 2023-05-24 LAB — CBC WITH DIFFERENTIAL/PLATELET
Basophils Absolute: 0 10*3/uL (ref 0.0–0.1)
Basophils Relative: 0.7 % (ref 0.0–3.0)
Eosinophils Absolute: 0.3 10*3/uL (ref 0.0–0.7)
Eosinophils Relative: 6.2 % — ABNORMAL HIGH (ref 0.0–5.0)
HCT: 46.8 % (ref 39.0–52.0)
Hemoglobin: 15.9 g/dL (ref 13.0–17.0)
Lymphocytes Relative: 14.2 % (ref 12.0–46.0)
Lymphs Abs: 0.7 10*3/uL (ref 0.7–4.0)
MCHC: 34 g/dL (ref 30.0–36.0)
MCV: 96.2 fl (ref 78.0–100.0)
Monocytes Absolute: 0.5 10*3/uL (ref 0.1–1.0)
Monocytes Relative: 9.6 % (ref 3.0–12.0)
Neutro Abs: 3.6 10*3/uL (ref 1.4–7.7)
Neutrophils Relative %: 69.3 % (ref 43.0–77.0)
Platelets: 123 10*3/uL — ABNORMAL LOW (ref 150.0–400.0)
RBC: 4.87 Mil/uL (ref 4.22–5.81)
RDW: 13 % (ref 11.5–15.5)
WBC: 5.1 10*3/uL (ref 4.0–10.5)

## 2023-05-24 LAB — LIPID PANEL
Cholesterol: 91 mg/dL (ref 0–200)
HDL: 42.6 mg/dL (ref >39.00)
LDL Cholesterol: 38 mg/dL (ref 0–99)
NonHDL: 48.43
Total CHOL/HDL Ratio: 2
Triglycerides: 50 mg/dL (ref 0.0–149.0)
VLDL: 10 mg/dL (ref 0.0–40.0)

## 2023-05-24 LAB — MICROALBUMIN / CREATININE URINE RATIO
Creatinine,U: 174.4 mg/dL
Microalb Creat Ratio: 2.2 mg/g (ref 0.0–30.0)
Microalb, Ur: 3.8 mg/dL — ABNORMAL HIGH (ref 0.0–1.9)

## 2023-05-24 LAB — HEMOGLOBIN A1C: Hgb A1c MFr Bld: 5.1 % (ref 4.6–6.5)

## 2023-05-24 LAB — PSA: PSA: 4.64 ng/mL — ABNORMAL HIGH (ref 0.10–4.00)

## 2023-05-24 LAB — TSH: TSH: 1.22 u[IU]/mL (ref 0.35–5.50)

## 2023-05-29 ENCOUNTER — Encounter: Payer: Self-pay | Admitting: Family Medicine

## 2023-05-29 MED ORDER — SIMVASTATIN 20 MG PO TABS: 20.0000 mg | ORAL_TABLET | Freq: Every day | ORAL | 0 refills | Status: DC

## 2023-07-04 ENCOUNTER — Encounter: Payer: Self-pay | Admitting: Family Medicine

## 2023-07-05 NOTE — Addendum Note (Signed)
Addended by: Roxanne Gates on: 07/05/2023 08:55 AM   Modules accepted: Orders

## 2023-07-06 ENCOUNTER — Other Ambulatory Visit (INDEPENDENT_AMBULATORY_CARE_PROVIDER_SITE_OTHER): Payer: Medicare Other

## 2023-07-06 DIAGNOSIS — D696 Thrombocytopenia, unspecified: Secondary | ICD-10-CM | POA: Diagnosis not present

## 2023-07-06 LAB — CBC WITH DIFFERENTIAL/PLATELET
Basophils Absolute: 0 10*3/uL (ref 0.0–0.1)
Basophils Relative: 0.6 % (ref 0.0–3.0)
Eosinophils Absolute: 0.3 10*3/uL (ref 0.0–0.7)
Eosinophils Relative: 5.7 % — ABNORMAL HIGH (ref 0.0–5.0)
HCT: 45.9 % (ref 39.0–52.0)
Hemoglobin: 15.5 g/dL (ref 13.0–17.0)
Lymphocytes Relative: 15 % (ref 12.0–46.0)
Lymphs Abs: 0.8 10*3/uL (ref 0.7–4.0)
MCHC: 33.8 g/dL (ref 30.0–36.0)
MCV: 95.6 fl (ref 78.0–100.0)
Monocytes Absolute: 0.5 10*3/uL (ref 0.1–1.0)
Monocytes Relative: 10.2 % (ref 3.0–12.0)
Neutro Abs: 3.6 10*3/uL (ref 1.4–7.7)
Neutrophils Relative %: 68.5 % (ref 43.0–77.0)
Platelets: 128 10*3/uL — ABNORMAL LOW (ref 150.0–400.0)
RBC: 4.8 Mil/uL (ref 4.22–5.81)
RDW: 12.6 % (ref 11.5–15.5)
WBC: 5.2 10*3/uL (ref 4.0–10.5)

## 2023-07-10 ENCOUNTER — Other Ambulatory Visit: Payer: Self-pay | Admitting: Family Medicine

## 2023-07-13 NOTE — Progress Notes (Signed)
HPI: FU CAD with prior PCI of his LAD in May 2003. Cardiac cath 2010 showed 30-40% LAD just distal to the stent. There was no other obstructive disease noted. The ejection fraction is 50%. He has been treated medically. Abdominal ultrasound in Oct 2011 showed no aneurysm. Nuclear study in March 2020 showed ejection fraction 56%.  Patient had significant ST changes but perfusion was normal.  Echocardiogram May 2020 showed normal LV function, mild left atrial enlargement.  Cardiac MRI October 2020 showed basal anteroseptal enhancement which could be seen with prior myocarditis but also could be seen with sarcoid. Pattern not consistent with amyloid.  Ejection fraction 61% and normal. Carotid Dopplers February 2023 showed 1 to 39% bilateral stenosis. Renal Dopplers August 2023 showed no renal artery stenosis. Since I last saw him, he denies dyspnea, chest pain, palpitations or syncope.  Current Outpatient Medications  Medication Sig Dispense Refill   aspirin 81 MG tablet Take 81 mg by mouth daily.     azelastine (ASTELIN) 0.1 % nasal spray Place 1 spray into both nostrils daily as needed for rhinitis. Use in each nostril as directed     Biotin 1000 MCG tablet Take 1,000 mcg by mouth in the morning, at noon, and at bedtime.     Boswellia Serrata (BOSWELLIA PO) Take 500 mg by mouth 3 (three) times daily.     cholecalciferol (VITAMIN D) 25 MCG (1000 UNIT) tablet Take 3,000 Units by mouth 3 (three) times daily.     Cinnamon 500 MG capsule Take 1,000 mg by mouth 2 (two) times daily.     Coenzyme Q10 (COQ-10) 100 MG capsule Take 1 capsule (100 mg total) by mouth daily.     Cyanocobalamin (VITAMIN B-12) 1000 MCG SUBL Take 1,000 mcg by mouth daily.     Diclofenac Sodium 3 % GEL Qid prn (Patient taking differently: Apply 1 application  topically daily as needed (Arthritis).) 100 g 3   ezetimibe (ZETIA) 10 MG tablet TAKE ONE TABLET BY MOUTH DAILY 90 tablet 3   Ginger, Zingiber officinalis, (GINGER ROOT) 550  MG CAPS Take 550 mg by mouth 2 (two) times daily.     hydrALAZINE (APRESOLINE) 100 MG tablet Take 1 tablet (100 mg total) by mouth 3 (three) times daily. 90 tablet 5   levothyroxine (SYNTHROID) 112 MCG tablet Take 1 tablet (112 mcg total) by mouth daily before breakfast. 90 tablet 3   metFORMIN (GLUCOPHAGE) 500 MG tablet TAKE 1 TABLET BY MOUTH TWICE A DAY WITH MEALS 180 tablet 3   Multiple Vitamins-Minerals (THERAGRAN-M ADVANCED 50 PLUS PO) Take 1 tablet by mouth daily.     Nerve Stimulator (CEFALY KIT) DEVI Apply 1 application topically daily at 12 noon. Electrical migraine kit for prevention on treatment of migraine headache     olmesartan (BENICAR) 40 MG tablet Take 1 tablet (40 mg total) by mouth daily. 90 tablet 3   pantoprazole (PROTONIX) 40 MG tablet Take 1 tablet (40 mg total) by mouth daily. 90 tablet 1   propranolol ER (INDERAL LA) 160 MG SR capsule Take 1 capsule (160 mg total) by mouth daily. 90 capsule 3   simvastatin (ZOCOR) 20 MG tablet Take 1 tablet (20 mg total) by mouth at bedtime. 90 tablet 0   tamsulosin (FLOMAX) 0.4 MG CAPS capsule Take 0.4 mg by mouth daily. 30 capsule    topiramate (TOPAMAX) 50 MG tablet TAKE 1 TABLET BY MOUTH EVERY NIGHT AT BEDTIME 90 tablet 1   No current facility-administered medications  for this visit.     Past Medical History:  Diagnosis Date   Abdominal bruit 07/22/2010   Acute sinusitis 03/26/2015   Acute suppr otitis media w spon rupt ear drum, recur, r ear 04/06/2017   Angina pectoris 12/11/2008   Benign essential tremor 08/29/2017   Bilateral sensorineural hearing loss 11/30/2015   CAD S/P percutaneous coronary angioplasty 12/11/2008   LAD PCI '03,  Cath 2010 showed 40% LAD- medical Rx Myoview March 2020 negative for ischemia but positive for ST depression- Imdur added.  Formatting of this note might be different from the original. LAD PCI '03,  Cath 2010 showed 40% LAD- medical Rx Myoview March 2020 negative for ischemia but positive for ST  depression- Imdur added.  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 Echo May 2020- EF 60-65% with mild LVH  Last Assessment & Plan:  Well controlled, no changes to meds. Encouraged heart healthy diet such as the DASH diet and exercise as tolerated.   Eustachian tube dysfunction, bilateral 11/30/2015   History of colonic polyps 07/12/2010   History of GI bleed 04/23/2020   History of left mastoidectomy 02/20/2020   History of placement of ear tubes    Hyperlipidemia associated with type 2 diabetes mellitus 07/10/2007   Hyperthyroidism 07/12/2010   Hypothyroidism 07/10/2007   Internal hemorrhoids    Intractable episodic cluster headache 08/29/2017   Major depressive disorder 07/10/2007   Mastoiditis of both sides 09/21/2016   Migraine headache    Mild neurocognitive disorder due to multiple etiologies 12/25/2020   Non-insulin dependent type 2 diabetes mellitus 07/06/2010   Osteoarthritis of multiple joints 07/10/2007   Perennial allergic rhinitis 09/22/2016   Postprocedural urinary retention    Presbycusis of both ears 01/23/2018   Skin cancer, basal cell    Thrombocytopenia    TIA (transient ischemic attack) 2006   per patient's report. He was never officially given this diagnosis   Upper airway cough syndrome 09/25/2017    Past Surgical History:  Procedure Laterality Date   COLONOSCOPY  2011   coronary artery disease status post placement     of drug-eluting stent in the LAD in 2003,eEF 65% then   DEBRIDEMENT  AND CLOSURE WOUND N/A 12/08/2020   Procedure: CLOSURE LEFT POST AURICULAR SCALP WOUND;  Surgeon: Glenna Fellows, MD;  Location: [facility];  Service: Plastics;  Laterality: N/A;   esophogeal dilation     fatty tissue removed     from neck 2003   HERNIA REPAIR  2007   mastoidectomy with tympanoplasty     MELANOMA EXCISION Left 11/17/2020   Procedure: WIDE LOCAL EXCISION, ADVANCED FLAP CLOSURE LEFT POSTERIOR EAR MELANOMA;  Surgeon: Almond Lint, MD;  Location: [facility];  Service: General;  Laterality: Left;   MELANOMA EXCISION Left 12/01/2020   Procedure: RE-EXCISION LEFT POSTERIOR AURICULAR MELANOMA WITH TEMPORARY CLOSURE;  Surgeon: Almond Lint, MD;  Location: [facility];  Service: General;  Laterality: Left;  60 MIN TOTAL   POLYPECTOMY  2011   +TA  right toe bone spur surgery     SKIN FULL THICKNESS GRAFT Left 12/08/2020   Procedure: Full thickness skin graft from left upper arm;  Surgeon: Glenna Fellows, MD;  Location: [facility];  Service: Plastics;  Laterality: Left;   THYROIDECTOMY, PARTIAL  07/2002   right thyroid   TONSILLECTOMY     UPPER GI ENDOSCOPY      Social History   Socioeconomic History   Marital status: Married    Spouse name: Charleen   Number of children: 0   Years of education: 14   Highest education level: Associate degree: occupational, Scientist, product/process development, or vocational program  Occupational History   Occupation: retired    Associate Professor: RETIRED    Comment: accountant  Tobacco Use   Smoking status: Former    Current packs/day: 0.00    Types: Cigarettes    Start date: 10/10/1960    Quit date: 10/10/1965    Years since quitting: 57.8   Smokeless tobacco: Never  Vaping Use   Vaping status: Never Used  Substance and Sexual Activity   Alcohol use: No   Drug use: No   Sexual activity: Yes    Partners: Female  Other Topics Concern   Not on file  Social History Narrative   Pt lives with spouse at private home in 2 story home-  he has no children- right handed- he drinks coffee daily, tea sometimes, soda not all the time. Exercise-- treadmill, stationary bike   Right handed   Social Determinants of Health   Financial Resource Strain: Low Risk  (11/08/2021)   Overall Financial Resource Strain (CARDIA)    Difficulty of Paying Living Expenses: Not hard at all  Food Insecurity: No Food Insecurity (11/10/2022)   Hunger Vital Sign    Worried About Running Out of Food in the Last Year: Never true    Ran Out of Food in the Last Year: Never true  Transportation Needs: No Transportation Needs (11/10/2022)   PRAPARE - Administrator, Civil Service (Medical): No    Lack of Transportation (Non-Medical): No  Physical Activity: Sufficiently Active (11/08/2021)   Exercise Vital Sign    Days of Exercise per Week: 7 days    Minutes of Exercise per Session: 30 min  Stress: No Stress Concern Present (11/08/2021)   [employer] of Occupational Health - Occupational Stress Questionnaire    Feeling of Stress : Not at all  Social Connections: Moderately Integrated (11/08/2021)   Social Connection and Isolation Panel [NHANES]    Frequency of Communication with Friends and Family: Three times a week    Frequency of Social Gatherings with Friends and Family: More than three times a week    Attends Religious Services: More than 4 times per year    Active Member of [employer] or Organizations: No    Attends Banker Meetings: Never    Marital Status: Married  Catering manager Violence: Not At Risk (11/10/2022)   Humiliation, Afraid, Rape, and Kick questionnaire    Fear of Current or Ex-Partner: No    Emotionally Abused: No    Physically Abused: No    Sexually Abused: No    Family History  Problem Relation Age of Onset   Heart disease Mother    Alzheimer's disease Mother    Heart disease Father        triple by pass   Hypertension Brother        Prostate Cancer   Prostate cancer Brother  Hypertension  Brother    Hypertension Brother    COPD Brother    Colon cancer Neg Hx    Colon polyps Neg Hx    Rectal cancer Neg Hx    Stomach cancer Neg Hx    Esophageal cancer Neg Hx     ROS: no fevers or chills, productive cough, hemoptysis, dysphasia, odynophagia, melena, hematochezia, dysuria, hematuria, rash, seizure activity, orthopnea, PND, pedal edema, claudication. Remaining systems are negative.  Physical Exam: Well-developed well-nourished in no acute distress.  Skin is warm and dry.  HEENT is normal.  Neck is supple.  Chest is clear to auscultation with normal expansion.  Cardiovascular exam is regular rate and rhythm.  Abdominal exam nontender or distended. No masses palpated. Extremities show no edema. neuro grossly intact   A/P  1 coronary artery disease-patient doing well from a symptomatic standpoint.  Continue medical therapy including aspirin and statin.  2 hypertension-patient's blood pressure is borderline.  He follows this closely at home.  He will continue to track and we will advance medications as needed.  3 hyperlipidemia-continue Zocor and Zetia.  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  Olga Millers, MD

## 2023-07-27 ENCOUNTER — Ambulatory Visit: Payer: Medicare Other | Attending: Cardiology | Admitting: Cardiology

## 2023-07-27 ENCOUNTER — Encounter: Payer: Self-pay | Admitting: Cardiology

## 2023-07-27 VITALS — BP 140/64 | HR 64 | Ht 69.0 in | Wt 158.0 lb

## 2023-07-27 DIAGNOSIS — I10 Essential (primary) hypertension: Secondary | ICD-10-CM | POA: Insufficient documentation

## 2023-07-27 DIAGNOSIS — I2510 Atherosclerotic heart disease of native coronary artery without angina pectoris: Secondary | ICD-10-CM | POA: Diagnosis not present

## 2023-07-27 DIAGNOSIS — E785 Hyperlipidemia, unspecified: Secondary | ICD-10-CM | POA: Insufficient documentation

## 2023-07-27 NOTE — Patient Instructions (Signed)
Medication Instructions:  No change  *If you need a refill on your cardiac medications before your next appointment, please call your pharmacy*   Lab Work: None  If you have labs (blood work) drawn today and your tests are completely normal, you will receive your results only by: MyChart Message (if you have MyChart) OR A paper copy in the mail If you have any lab test that is abnormal or we need to change your treatment, we will call you to review the results.   Testing/Procedures: none   Follow-Up: At [facility], you and your health needs are our priority.  As part of our continuing mission to provide you with exceptional heart care, we have created designated Provider Care Teams.  These Care Teams include your primary Cardiologist (physician) and Advanced Practice Providers (APPs -  Physician Assistants and Nurse Practitioners) who all work together to provide you with the care you need, when you need it.   Your next appointment:   6 month(s)  Provider:   Olga Millers, MD     Other Instructions NONE

## 2023-08-02 DIAGNOSIS — N1831 Chronic kidney disease, stage 3a: Secondary | ICD-10-CM | POA: Diagnosis not present

## 2023-08-02 DIAGNOSIS — E1122 Type 2 diabetes mellitus with diabetic chronic kidney disease: Secondary | ICD-10-CM | POA: Diagnosis not present

## 2023-08-02 DIAGNOSIS — N2581 Secondary hyperparathyroidism of renal origin: Secondary | ICD-10-CM | POA: Diagnosis not present

## 2023-08-02 DIAGNOSIS — I129 Hypertensive chronic kidney disease with stage 1 through stage 4 chronic kidney disease, or unspecified chronic kidney disease: Secondary | ICD-10-CM | POA: Diagnosis not present

## 2023-08-02 DIAGNOSIS — D631 Anemia in chronic kidney disease: Secondary | ICD-10-CM | POA: Diagnosis not present

## 2023-08-10 ENCOUNTER — Other Ambulatory Visit: Payer: Self-pay | Admitting: Family Medicine

## 2023-08-14 DIAGNOSIS — Z23 Encounter for immunization: Secondary | ICD-10-CM | POA: Diagnosis not present

## 2023-09-09 ENCOUNTER — Other Ambulatory Visit: Payer: Self-pay | Admitting: Cardiology

## 2023-09-09 DIAGNOSIS — I10 Essential (primary) hypertension: Secondary | ICD-10-CM

## 2023-09-21 ENCOUNTER — Other Ambulatory Visit: Payer: Self-pay | Admitting: Nurse Practitioner

## 2023-09-21 ENCOUNTER — Other Ambulatory Visit: Payer: Self-pay | Admitting: Neurology

## 2023-09-26 DIAGNOSIS — L308 Other specified dermatitis: Secondary | ICD-10-CM | POA: Diagnosis not present

## 2023-09-26 DIAGNOSIS — Z08 Encounter for follow-up examination after completed treatment for malignant neoplasm: Secondary | ICD-10-CM | POA: Diagnosis not present

## 2023-09-26 DIAGNOSIS — D225 Melanocytic nevi of trunk: Secondary | ICD-10-CM | POA: Diagnosis not present

## 2023-09-26 DIAGNOSIS — Z85828 Personal history of other malignant neoplasm of skin: Secondary | ICD-10-CM | POA: Diagnosis not present

## 2023-09-26 DIAGNOSIS — L814 Other melanin hyperpigmentation: Secondary | ICD-10-CM | POA: Diagnosis not present

## 2023-09-26 DIAGNOSIS — L821 Other seborrheic keratosis: Secondary | ICD-10-CM | POA: Diagnosis not present

## 2023-09-26 DIAGNOSIS — Z85820 Personal history of malignant melanoma of skin: Secondary | ICD-10-CM | POA: Diagnosis not present

## 2023-09-26 DIAGNOSIS — L570 Actinic keratosis: Secondary | ICD-10-CM | POA: Diagnosis not present

## 2023-09-27 DIAGNOSIS — Z23 Encounter for immunization: Secondary | ICD-10-CM | POA: Diagnosis not present

## 2023-10-06 DIAGNOSIS — H52203 Unspecified astigmatism, bilateral: Secondary | ICD-10-CM | POA: Diagnosis not present

## 2023-10-06 DIAGNOSIS — H40013 Open angle with borderline findings, low risk, bilateral: Secondary | ICD-10-CM | POA: Diagnosis not present

## 2023-10-06 DIAGNOSIS — H2513 Age-related nuclear cataract, bilateral: Secondary | ICD-10-CM | POA: Diagnosis not present

## 2023-11-07 ENCOUNTER — Other Ambulatory Visit: Payer: Self-pay | Admitting: Family Medicine

## 2023-11-14 ENCOUNTER — Ambulatory Visit (INDEPENDENT_AMBULATORY_CARE_PROVIDER_SITE_OTHER): Payer: Medicare Other | Admitting: *Deleted

## 2023-11-14 VITALS — BP 167/72 | HR 64 | Ht 69.0 in | Wt 158.4 lb

## 2023-11-14 DIAGNOSIS — Z0000 Encounter for general adult medical examination without abnormal findings: Secondary | ICD-10-CM | POA: Diagnosis not present

## 2023-11-14 NOTE — Patient Instructions (Signed)
 [name] , Thank you for taking time to come for your Medicare Wellness Visit. I appreciate your ongoing commitment to your health goals. Please review the following plan we discussed and let me know if I can assist you in the future.     This is a list of the screening recommended for you and due dates:  Health Maintenance  Topic Date Due   COVID-19 Vaccine (7 - 2024-25 season) 06/11/2023   Eye exam for diabetics  09/21/2023   Complete foot exam   11/23/2023   Hemoglobin A1C  11/24/2023   Yearly kidney function blood test for diabetes  05/23/2024   Yearly kidney health urinalysis for diabetes  05/23/2024   Colon Cancer Screening  07/08/2024   Medicare Annual Wellness Visit  11/13/2024   DTaP/Tdap/Td vaccine (4 - Td or Tdap) 05/14/2031   Pneumonia Vaccine  Completed   Flu Shot  Completed   Hepatitis C Screening  Completed   Zoster (Shingles) Vaccine  Completed   HPV Vaccine  Aged Out    Next appointment: Follow up in one year for your annual wellness visit.   Preventive Care 79 Years and Older, Male Preventive care refers to lifestyle choices and visits with your health care provider that can promote health and wellness. What does preventive care include? A yearly physical exam. This is also called an annual well check. Dental exams once or twice a year. Routine eye exams. Ask your health care provider how often you should have your eyes checked. Personal lifestyle choices, including: Daily care of your teeth and gums. Regular physical activity. Eating a healthy diet. Avoiding tobacco and drug use. Limiting alcohol use. Practicing safe sex. Taking low doses of aspirin  every day. Taking vitamin and mineral supplements as recommended by your health care provider. What happens during an annual well check? The services and screenings done by your health care provider during your annual well check will depend on your age, overall health, lifestyle risk factors, and family  history of disease. Counseling  Your health care provider may ask you questions about your: Alcohol use. Tobacco use. Drug use. Emotional well-being. Home and relationship well-being. Sexual activity. Eating habits. History of falls. Memory and ability to understand (cognition). Work and work astronomer. Screening  You may have the following tests or measurements: Height, weight, and BMI. Blood pressure. Lipid and cholesterol levels. These may be checked every 5 years, or more frequently if you are over 57 years old. Skin check. Lung cancer screening. You may have this screening every year starting at age 60 if you have a 30-pack-year history of smoking and currently smoke or have quit within the past 15 years. Fecal occult blood test (FOBT) of the stool. You may have this test every year starting at age 23. Flexible sigmoidoscopy or colonoscopy. You may have a sigmoidoscopy every 5 years or a colonoscopy every 10 years starting at age 9. Prostate cancer screening. Recommendations will vary depending on your family history and other risks. Hepatitis C blood test. Hepatitis B blood test. Sexually transmitted disease (STD) testing. Diabetes screening. This is done by checking your blood sugar (glucose) after you have not eaten for a while (fasting). You may have this done every 1-3 years. Abdominal aortic aneurysm (AAA) screening. You may need this if you are a current or former smoker. Osteoporosis. You may be screened starting at age 83 if you are at high risk. Talk with your health care provider about your test results, treatment options, and  if necessary, the need for more tests. Vaccines  Your health care provider may recommend certain vaccines, such as: Influenza vaccine. This is recommended every year. Tetanus, diphtheria, and acellular pertussis (Tdap, Td) vaccine. You may need a Td booster every 10 years. Zoster vaccine. You may need this after age 38. Pneumococcal  13-valent conjugate (PCV13) vaccine. One dose is recommended after age 37. Pneumococcal polysaccharide (PPSV23) vaccine. One dose is recommended after age 40. Talk to your health care provider about which screenings and vaccines you need and how often you need them. This information is not intended to replace advice given to you by your health care provider. Make sure you discuss any questions you have with your health care provider. Document Released: 10/23/2015 Document Revised: 06/15/2016 Document Reviewed: 07/28/2015 Elsevier Interactive Patient Education  2017 [employer].  Fall Prevention in the Home Falls can cause injuries. They can happen to people of all ages. There are many things you can do to make your home safe and to help prevent falls. What can I do on the outside of my home? Regularly fix the edges of walkways and driveways and fix any cracks. Remove anything that might make you trip as you walk through a door, such as a raised step or threshold. Trim any bushes or trees on the path to your home. Use bright outdoor lighting. Clear any walking paths of anything that might make someone trip, such as rocks or tools. Regularly check to see if handrails are loose or broken. Make sure that both sides of any steps have handrails. Any raised decks and porches should have guardrails on the edges. Have any leaves, snow, or ice cleared regularly. Use sand or salt on walking paths during winter. Clean up any spills in your garage right away. This includes oil or grease spills. What can I do in the bathroom? Use night lights. Install grab bars by the toilet and in the tub and shower. Do not use towel bars as grab bars. Use non-skid mats or decals in the tub or shower. If you need to sit down in the shower, use a plastic, non-slip stool. Keep the floor dry. Clean up any water that spills on the floor as soon as it happens. Remove soap buildup in the tub or shower regularly. Attach  bath mats securely with double-sided non-slip rug tape. Do not have throw rugs and other things on the floor that can make you trip. What can I do in the bedroom? Use night lights. Make sure that you have a light by your bed that is easy to reach. Do not use any sheets or blankets that are too big for your bed. They should not hang down onto the floor. Have a firm chair that has side arms. You can use this for support while you get dressed. Do not have throw rugs and other things on the floor that can make you trip. What can I do in the kitchen? Clean up any spills right away. Avoid walking on wet floors. Keep items that you use a lot in easy-to-reach places. If you need to reach something above you, use a strong step stool that has a grab bar. Keep electrical cords out of the way. Do not use floor polish or wax that makes floors slippery. If you must use wax, use non-skid floor wax. Do not have throw rugs and other things on the floor that can make you trip. What can I do with my stairs? Do not leave any  items on the stairs. Make sure that there are handrails on both sides of the stairs and use them. Fix handrails that are broken or loose. Make sure that handrails are as long as the stairways. Check any carpeting to make sure that it is firmly attached to the stairs. Fix any carpet that is loose or worn. Avoid having throw rugs at the top or bottom of the stairs. If you do have throw rugs, attach them to the floor with carpet tape. Make sure that you have a light switch at the top of the stairs and the bottom of the stairs. If you do not have them, ask someone to add them for you. What else can I do to help prevent falls? Wear shoes that: Do not have high heels. Have rubber bottoms. Are comfortable and fit you well. Are closed at the toe. Do not wear sandals. If you use a stepladder: Make sure that it is fully opened. Do not climb a closed stepladder. Make sure that both sides of the  stepladder are locked into place. Ask someone to hold it for you, if possible. Clearly mark and make sure that you can see: Any grab bars or handrails. First and last steps. Where the edge of each step is. Use tools that help you move around (mobility aids) if they are needed. These include: Canes. Walkers. Scooters. Crutches. Turn on the lights when you go into a dark area. Replace any light bulbs as soon as they burn out. Set up your furniture so you have a clear path. Avoid moving your furniture around. If any of your floors are uneven, fix them. If there are any pets around you, be aware of where they are. Review your medicines with your doctor. Some medicines can make you feel dizzy. This can increase your chance of falling. Ask your doctor what other things that you can do to help prevent falls. This information is not intended to replace advice given to you by your health care provider. Make sure you discuss any questions you have with your health care provider. Document Released: 07/23/2009 Document Revised: 03/03/2016 Document Reviewed: 10/31/2014 Elsevier Interactive Patient Education  2017 [employer].

## 2023-11-14 NOTE — Progress Notes (Signed)
 Subjective:   [name] is a 78 y.o. male who presents for Medicare Annual/Subsequent preventive examination.  Visit Complete: In person  Patient Medicare AWV questionnaire was completed by the patient on 11/13/23; I have confirmed that all information answered by patient is correct and no changes since this date.  Cardiac Risk Factors include: advanced age (>61men, >14 women);male gender;diabetes mellitus;dyslipidemia;hypertension     Objective:    Today's Vitals   11/14/23 1432 11/14/23 1444  BP: (!) 150/67 (!) 167/72  Pulse: 61 64  Weight: 158 lb 6.4 oz (71.8 kg)   Height: 5' 9 (1.753 m)    Body mass index is 23.39 kg/m.     11/14/2023    2:34 PM 11/10/2022    2:57 PM 04/19/2022    1:32 PM 11/17/2021    8:38 PM 11/08/2021    2:30 PM 12/08/2020   10:18 AM 12/02/2020    2:17 PM  Advanced Directives  Does Patient Have a Medical Advance Directive? Yes Yes Yes No Yes Yes Yes  Type of Estate Agent of [place];Living will Healthcare Power of [place];Living will Healthcare Power of [place];Out of facility DNR (pink MOST or yellow form);Living will  Healthcare Power of [place];Living will Healthcare Power of [place];Living will Healthcare Power of [place];Living will  Does patient want to make changes to medical advance directive? No - Patient declined No - Patient declined    No - Patient declined No - Patient declined  Copy of Healthcare Power of Attorney in Chart? Yes - validated most recent copy scanned in chart (See row information) Yes - validated most recent copy scanned in chart (See row information)   Yes - validated most recent copy scanned in chart (See row information) Yes - validated most recent copy scanned in chart (See row information) Yes - validated most recent copy scanned in chart (See row information)  Would patient like information on creating a medical advance directive?    No - Patient declined       Current Medications  (verified) Outpatient Encounter Medications as of 11/14/2023  Medication Sig   AMBULATORY NON FORMULARY MEDICATION Collagen Peptides   aspirin  81 MG tablet Take 81 mg by mouth daily.   azelastine  (ASTELIN ) 0.1 % nasal spray Place 1 spray into both nostrils daily as needed for rhinitis. Use in each nostril as directed   Biotin 1000 MCG tablet Take 1,000 mcg by mouth in the morning, at noon, and at bedtime.   Boswellia Serrata (BOSWELLIA PO) Take 500 mg by mouth 3 (three) times daily.   cholecalciferol (VITAMIN D) 25 MCG (1000 UNIT) tablet Take 3,000 Units by mouth 3 (three) times daily.   Cinnamon 500 MG capsule Take 1,000 mg by mouth 2 (two) times daily.   Coenzyme Q10 (COQ-10) 100 MG capsule Take 1 capsule (100 mg total) by mouth daily.   Cyanocobalamin (VITAMIN B-12) 1000 MCG SUBL Take 1,000 mcg by mouth daily.   Diclofenac  Sodium 3 % GEL Qid prn (Patient taking differently: Apply 1 application  topically daily as needed (Arthritis).)   ezetimibe  (ZETIA ) 10 MG tablet TAKE ONE TABLET BY MOUTH DAILY   Ginger, Zingiber officinalis, (GINGER ROOT) 550 MG CAPS Take 550 mg by mouth 2 (two) times daily.   hydrALAZINE  (APRESOLINE ) 100 MG tablet TAKE 1 TABLET BY MOUTH 3 TIMES A DAY   levothyroxine  (SYNTHROID ) 112 MCG tablet Take 1 tablet (112 mcg total) by mouth daily before breakfast.   metFORMIN  (GLUCOPHAGE ) 500 MG tablet TAKE 1  TABLET BY MOUTH TWICE A DAY WITH MEALS   Multiple Vitamins-Minerals (THERAGRAN-M ADVANCED 50 PLUS PO) Take 1 tablet by mouth daily.   Nerve Stimulator (CEFALY KIT) DEVI Apply 1 application topically daily at 12 noon. Electrical migraine kit for prevention on treatment of migraine headache   olmesartan  (BENICAR ) 40 MG tablet TAKE 1 TABLET BY MOUTH DAILY   pantoprazole  (PROTONIX ) 40 MG tablet Take 1 tablet (40 mg total) by mouth daily.   propranolol  ER (INDERAL  LA) 160 MG SR capsule Take 1 capsule (160 mg total) by mouth daily.   simvastatin  (ZOCOR ) 20 MG tablet TAKE 1 TABLET BY  MOUTH AT BEDTIME   tamsulosin  (FLOMAX ) 0.4 MG CAPS capsule Take 0.4 mg by mouth daily.   topiramate  (TOPAMAX ) 50 MG tablet TAKE 1 TABLET BY MOUTH EVERY NIGHT AT BEDTIME   No facility-administered encounter medications on file as of 11/14/2023.    Allergies (verified) Crestor [rosuvastatin calcium ], Nitrofurantoin , Rosuvastatin, Sulfa antibiotics, Sulfamethoxazole-trimethoprim, Sulfasalazine, Amlodipine , Codeine, Hydrocodone-acetaminophen , and Other   History: Past Medical History:  Diagnosis Date   Abdominal bruit 07/22/2010   Acute sinusitis 03/26/2015   Acute suppr otitis media w spon rupt ear drum, recur, r ear 04/06/2017   Allergy    Angina pectoris 12/11/2008   Benign essential tremor 08/29/2017   Bilateral sensorineural hearing loss 11/30/2015   CAD S/P percutaneous coronary angioplasty 12/11/2008   LAD PCI '03,  Cath 2010 showed 40% LAD- medical Rx Myoview  March 2020 negative for ischemia but positive for ST depression- Imdur  added.  Formatting of this note might be different from the original. LAD PCI '03,  Cath 2010 showed 40% LAD- medical Rx Myoview  March 2020 negative for ischemia but positive for ST depression- Imdur  added.  Last Assessment & Plan:  Check labs con't meds Followed by car   Cataract    Dr. 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 Echo May 2020- EF 60-65% with mild LVH  Last Assessment & Plan:  Well controlled, no changes to meds. Encouraged heart healthy diet such as the DASH diet and exercise as tolerated.   Eustachian tube dysfunction, bilateral  11/30/2015   History of colonic polyps 07/12/2010   History of GI bleed 04/23/2020   History of left mastoidectomy 02/20/2020   History of placement of ear tubes    Hyperlipidemia associated with type 2 diabetes mellitus 07/10/2007   Hyperthyroidism 07/12/2010   Hypothyroidism 07/10/2007   Internal hemorrhoids    Intractable episodic cluster headache 08/29/2017   Major depressive disorder 07/10/2007   Mastoiditis of both sides 09/21/2016   Migraine headache    Mild neurocognitive disorder due to multiple etiologies 12/25/2020   Non-insulin  dependent type 2 diabetes mellitus 07/06/2010   Osteoarthritis of multiple joints 07/10/2007   Perennial allergic rhinitis 09/22/2016   Postprocedural urinary retention    Presbycusis of both ears 01/23/2018   Skin cancer, basal cell    Thrombocytopenia    TIA (transient ischemic attack) 2006   per patient's report. He was never officially given this diagnosis   Upper airway cough syndrome 09/25/2017   Past Surgical History:  Procedure Laterality  Date   COLONOSCOPY  2011   coronary artery disease status post placement     of drug-eluting stent in the LAD in 2003,eEF 65% then   DEBRIDEMENT AND CLOSURE WOUND N/A 12/08/2020   Procedure: CLOSURE LEFT POST AURICULAR SCALP WOUND;  Surgeon: Arelia Filippo, MD;  Location: [facility];  Service: Plastics;  Laterality: N/A;   esophogeal dilation     fatty tissue removed     from neck 2003   HERNIA REPAIR  2007   Dr. Elon Pacini Patient # [phone]   mastoidectomy with tympanoplasty     MELANOMA EXCISION Left 11/17/2020   Procedure: WIDE LOCAL EXCISION, ADVANCED FLAP CLOSURE LEFT POSTERIOR EAR MELANOMA;  Surgeon: Aron Shoulders, MD;  Location: [facility];  Service: General;  Laterality: Left;   MELANOMA EXCISION Left 12/01/2020   Procedure: RE-EXCISION LEFT POSTERIOR AURICULAR MELANOMA WITH TEMPORARY CLOSURE;  Surgeon: Aron Shoulders, MD;  Location: [facility] OR;  Service:  General;  Laterality: Left;  60 MIN TOTAL   POLYPECTOMY  2011   +TA   right toe bone spur surgery     SKIN FULL THICKNESS GRAFT Left 12/08/2020   Procedure: Full thickness skin graft from left upper arm;  Surgeon: Arelia Filippo, MD;  Location:  SURGERY CENTER;  Service: Plastics;  Laterality: Left;   THYROIDECTOMY, PARTIAL  07/2002   right thyroid    TONSILLECTOMY     UPPER GI ENDOSCOPY     Family History  Problem Relation Age of Onset   Heart disease Mother    Alzheimer's disease Mother    Arthritis Mother    Heart disease Father        triple by pass   Hearing loss Father    Hypertension Father    Hypertension Brother        Prostate Cancer   Prostate cancer Brother    Alcohol abuse Brother    Hypertension Brother    Asthma Brother    Hypertension Brother    COPD Brother    Colon cancer Neg Hx    Colon polyps Neg Hx    Rectal cancer Neg Hx    Stomach cancer Neg Hx    Esophageal cancer Neg Hx    Social History   Socioeconomic History   Marital status: Married    Spouse name: Charleen   Number of children: 0   Years of education: 14   Highest education level: Associate degree: academic program  Occupational History   Occupation: retired    Associate Professor: RETIRED    Comment: accountant  Tobacco Use   Smoking status: Former    Current packs/day: 0.00    Types: Cigarettes    Start date: 10/10/1960    Quit date: 10/10/1965    Years since quitting: 58.1   Smokeless tobacco: Never  Vaping Use   Vaping status: Never Used  Substance and Sexual Activity   Alcohol use: No   Drug use: No   Sexual activity: Yes    Partners: Female  Other Topics Concern   Not on file  Social History Narrative   Pt lives with spouse at private home in 2 story home- he has no children- right handed- he drinks coffee daily, tea sometimes, soda not all the time. Exercise-- treadmill, stationary bike   Right handed   Social Drivers of Health   Financial Resource Strain: Low Risk   (11/13/2023)   Overall Financial Resource Strain (CARDIA)    Difficulty of Paying Living Expenses: Not hard  at all  Food Insecurity: No Food Insecurity (11/13/2023)   Hunger Vital Sign    Worried About Running Out of Food in the Last Year: Never true    Ran Out of Food in the Last Year: Never true  Transportation Needs: No Transportation Needs (11/13/2023)   PRAPARE - Administrator, Civil Service (Medical): No    Lack of Transportation (Non-Medical): No  Physical Activity: Sufficiently Active (11/13/2023)   Exercise Vital Sign    Days of Exercise per Week: 7 days    Minutes of Exercise per Session: 40 min  Stress: No Stress Concern Present (11/13/2023)   [employer] of Occupational Health - Occupational Stress Questionnaire    Feeling of Stress : Not at all  Social Connections: Socially Integrated (11/13/2023)   Social Connection and Isolation Panel [NHANES]    Frequency of Communication with Friends and Family: More than three times a week    Frequency of Social Gatherings with Friends and Family: More than three times a week    Attends Religious Services: More than 4 times per year    Active Member of [employer] or Organizations: Yes    Attends Banker Meetings: 1 to 4 times per year    Marital Status: Married    Tobacco Counseling Counseling given: Not Answered   Clinical Intake:  Pre-visit preparation completed: Yes  Pain : No/denies pain  BMI - recorded: 23.39 Nutritional Status: BMI of 19-24  Normal Nutritional Risks: None Diabetes: Yes CBG done?: No Did pt. bring in CBG monitor from home?: No  How often do you need to have someone help you when you read instructions, pamphlets, or other written materials from your doctor or pharmacy?: 1 - Never  Interpreter Needed?: No  Information entered by :: Jeoffrey Lease, CMA   Activities of Daily Living    11/13/2023   10:16 AM  In your present state of health, do you have any difficulty performing the  following activities:  Hearing? 1  Comment wears hearing aids  Vision? 0  Difficulty concentrating or making decisions? 0  Walking or climbing stairs? 0  Dressing or bathing? 0  Doing errands, shopping? 0  Preparing Food and eating ? N  Using the Toilet? N  In the past six months, have you accidently leaked urine? N  Do you have problems with loss of bowel control? N  Managing your Medications? N  Managing your Finances? N  Housekeeping or managing your Housekeeping? N    Patient Care Team: Antonio Meth, Jamee SAUNDERS, DO as PCP - General Pietro Redell RAMAN, MD as PCP - Cardiology (Cardiology) Pietro Redell RAMAN, MD as Consulting Physician (Cardiology) Renda Glance, MD as Consulting Physician (Urology) Roark Rush, MD as Consulting Physician (Otolaryngology) Aneita Gwendlyn DASEN, MD (Inactive) as Consulting Physician (Gastroenterology) Leslee Reusing, MD as Consulting Physician (Ophthalmology) Skeet Juliene SAUNDERS, DO as Consulting Physician (Neurology) Elnor Rome BROCKS, MD as Referring Physician (Dermatology) Shari Sieving, MD as Consulting Physician (Orthopedic Surgery) Gaspar Kung, MD as Referring Physician (Orthopedic Surgery)  Indicate any recent Medical Services you may have received from other than [facility] providers in the past year (date may be approximate).     Assessment:   This is a routine wellness examination for Trevor.  Hearing/Vision screen No results found.   Goals Addressed   None    Depression Screen    11/14/2023    2:39 PM 05/23/2023    9:21 AM 11/22/2022    9:36 AM 11/10/2022  2:49 PM 12/29/2021    1:25 PM 11/08/2021    2:37 PM 11/05/2020   12:42 PM  PHQ 2/9 Scores  PHQ - 2 Score 0 0 0 0 0 1 0  PHQ- 9 Score  2 2        Fall Risk    11/13/2023   10:16 AM 11/10/2022    2:58 PM 04/19/2022    1:32 PM 12/29/2021    1:25 PM 11/08/2021    2:35 PM  Fall Risk   Falls in the past year? 0 0 0 0 0  Number falls in past yr: 0 0 0 0 0  Injury with Fall? 0 0 0 0 0  Risk  for fall due to : No Fall Risks No Fall Risks     Follow up Falls evaluation completed Falls evaluation completed   Falls prevention discussed    MEDICARE RISK AT HOME: Medicare Risk at Home Any stairs in or around the home?: (Patient-Rptd) Yes If so, are there any without handrails?: (Patient-Rptd) No Home free of loose throw rugs in walkways, pet beds, electrical cords, etc?: (Patient-Rptd) Yes Adequate lighting in your home to reduce risk of falls?: (Patient-Rptd) Yes Life alert?: (Patient-Rptd) No Use of a cane, walker or w/c?: (Patient-Rptd) No Grab bars in the bathroom?: (Patient-Rptd) No Shower chair or bench in shower?: (Patient-Rptd) Yes Elevated toilet seat or a handicapped toilet?: (Patient-Rptd) No  TIMED UP AND GO:  Was the test performed?  Yes  Length of time to ambulate 10 feet: 7 sec Gait steady and fast without use of assistive device    Cognitive Function:    10/06/2016    9:42 AM  MMSE - Mini Mental State Exam  Orientation to time 5  Orientation to Place 5  Registration 3  Attention/ Calculation 5  Recall 3  Language- name 2 objects 2  Language- repeat 1  Language- follow 3 step command 3  Language- read & follow direction 1  Write a sentence 1  Copy design 1  Total score 30        11/14/2023    2:41 PM 11/10/2022    3:11 PM 11/05/2020    1:02 PM  6CIT Screen  What Year? 0 points 0 points 0 points  What month? 0 points 0 points 0 points  What time? 3 points 0 points 0 points  Count back from 20 0 points 0 points 0 points  Months in reverse 0 points 0 points 0 points  Repeat phrase 0 points 0 points 0 points  Total Score 3 points 0 points 0 points    Immunizations Immunization History  Administered Date(s) Administered   Fluad Quad(high Dose 65+) 06/30/2019   Influenza Split 07/26/2011   Influenza Whole 08/30/2007, 07/08/2009, 07/06/2010   Influenza, High Dose Seasonal PF 07/11/2014, 10/06/2016, 07/23/2017   Influenza,inj,Quad PF,6+ Mos  07/05/2013   Influenza-Unspecified 08/19/2015, 08/02/2018, 08/06/2020, 08/02/2021, 08/25/2022   PFIZER(Purple Top)SARS-COV-2 Vaccination 10/30/2019, 11/20/2019, 07/17/2020, 01/08/2021   Pfizer Covid-19 Vaccine Bivalent Booster 73yrs & up 07/26/2021   Pfizer(Comirnaty )Fall Seasonal Vaccine 12 years and older 08/11/2022   Pneumococcal Conjugate-13 12/17/2013   Pneumococcal Polysaccharide-23 04/23/2003, 07/08/2009, 01/14/2020   Respiratory Syncytial Virus Vaccine ,Recomb Aduvanted(Arexvy ) 06/30/2022   Td 02/19/2003, 05/13/2021   Tdap 09/19/2013   Zoster Recombinant(Shingrix) 07/25/2017, 09/26/2017   Zoster, Live 02/21/2007    TDAP status: Up to date  Flu Vaccine status: Up to date  Pneumococcal vaccine status: Up to date  Covid-19 vaccine status: Information provided on how to  obtain vaccines.   Qualifies for Shingles Vaccine? Yes   Zostavax completed Yes   Shingrix Completed?: Yes  Screening Tests Health Maintenance  Topic Date Due   COVID-19 Vaccine (7 - 2024-25 season) 06/11/2023   OPHTHALMOLOGY EXAM  09/21/2023   Medicare Annual Wellness (AWV)  11/11/2023   FOOT EXAM  11/23/2023   HEMOGLOBIN A1C  11/24/2023   Diabetic kidney evaluation - eGFR measurement  05/23/2024   Diabetic kidney evaluation - Urine ACR  05/23/2024   Colonoscopy  07/08/2024   DTaP/Tdap/Td (4 - Td or Tdap) 05/14/2031   Pneumonia Vaccine 76+ Years old  Completed   INFLUENZA VACCINE  Completed   Hepatitis C Screening  Completed   Zoster Vaccines- Shingrix  Completed   HPV VACCINES  Aged Out    Health Maintenance  Health Maintenance Due  Topic Date Due   COVID-19 Vaccine (7 - 2024-25 season) 06/11/2023   OPHTHALMOLOGY EXAM  09/21/2023   Medicare Annual Wellness (AWV)  11/11/2023    Colorectal cancer screening: No longer required.   Lung Cancer Screening: (Low Dose CT Chest recommended if Age 74-80 years, 20 pack-year currently smoking OR have quit w/in 15years.) does not qualify.   Additional  Screening:  Hepatitis C Screening: does qualify; Completed 09/24/15  Vision Screening: Recommended annual ophthalmology exams for early detection of glaucoma and other disorders of the eye. Is the patient up to date with their annual eye exam?  Yes  Who is the provider or what is the name of the office in which the patient attends annual eye exams? Dr. McCuen If pt is not established with a provider, would they like to be referred to a provider to establish care? No .   Dental Screening: Recommended annual dental exams for proper oral hygiene  Diabetic Foot Exam: Diabetic Foot Exam: Completed 11/22/22  Community Resource Referral / Chronic Care Management: CRR required this visit?  No   CCM required this visit?  No     Plan:     I have personally reviewed and noted the following in the patient's chart:   Medical and social history Use of alcohol, tobacco or illicit drugs  Current medications and supplements including opioid prescriptions. Patient is not currently taking opioid prescriptions. Functional ability and status Nutritional status Physical activity Advanced directives List of other physicians Hospitalizations, surgeries, and ER visits in previous 12 months Vitals Screenings to include cognitive, depression, and falls Referrals and appointments  In addition, I have reviewed and discussed with patient certain preventive protocols, quality metrics, and best practice recommendations. A written personalized care plan for preventive services as well as general preventive health recommendations were provided to patient.     Kandis Gauze, CMA   11/14/2023   After Visit Summary: (In Person-Declined) Patient declined AVS at this time.  Nurse Notes: None

## 2023-11-23 ENCOUNTER — Encounter: Payer: Self-pay | Admitting: Family Medicine

## 2023-11-23 ENCOUNTER — Ambulatory Visit: Payer: Medicare Other | Admitting: Family Medicine

## 2023-11-23 VITALS — BP 150/68 | HR 60 | Temp 97.9°F | Resp 18 | Ht 69.0 in | Wt 158.0 lb

## 2023-11-23 DIAGNOSIS — I10 Essential (primary) hypertension: Secondary | ICD-10-CM | POA: Diagnosis not present

## 2023-11-23 DIAGNOSIS — E785 Hyperlipidemia, unspecified: Secondary | ICD-10-CM

## 2023-11-23 DIAGNOSIS — N1831 Chronic kidney disease, stage 3a: Secondary | ICD-10-CM

## 2023-11-23 DIAGNOSIS — E1169 Type 2 diabetes mellitus with other specified complication: Secondary | ICD-10-CM | POA: Diagnosis not present

## 2023-11-23 DIAGNOSIS — Z7984 Long term (current) use of oral hypoglycemic drugs: Secondary | ICD-10-CM | POA: Diagnosis not present

## 2023-11-23 DIAGNOSIS — E039 Hypothyroidism, unspecified: Secondary | ICD-10-CM | POA: Diagnosis not present

## 2023-11-23 DIAGNOSIS — E1165 Type 2 diabetes mellitus with hyperglycemia: Secondary | ICD-10-CM

## 2023-11-23 DIAGNOSIS — D696 Thrombocytopenia, unspecified: Secondary | ICD-10-CM | POA: Diagnosis not present

## 2023-11-23 DIAGNOSIS — C439 Malignant melanoma of skin, unspecified: Secondary | ICD-10-CM

## 2023-11-23 NOTE — Patient Instructions (Signed)

## 2023-11-23 NOTE — Progress Notes (Signed)
 Established Patient Office Visit  Subjective   Patient ID: [name], male    DOB: [birth year]  Age: 78 y.o. MRN: [id number]  Chief Complaint  Patient presents with   Diabetes   Hyperlipidemia   Hypertension   Hypothyroidism   Follow-up    HPI Discussed the use of AI scribe software for clinical note transcription with the patient, who gave verbal consent to proceed.  History of Present Illness   [name] "[name]" is a 78 year old male with hypertension who presents for routine follow-up.  He has experienced fluctuations in blood pressure recently, with a notable spike a week or two ago followed by a decrease. This morning, his blood pressure was 125/60 mmHg, but it increased to 150 mmHg later in the day. He is currently taking his blood pressure medication as prescribed.  He remains active and is glad to be on his feet. He has received his flu and pneumonia vaccinations. He does not monitor his blood sugar levels at home and humorously comments that he is 'sweet always.'  He discusses his footwear, mentioning that he wears Skechers with memory foam, which he finds comfortable. He notes that the memory foam used to wear out quickly, but the newer models seem to have improved.      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   Drug-induced neutropenia 11/10/2020   History of GI bleed 04/23/2020   History of left mastoidectomy  02/20/2020   Chronic mastoiditis 02/11/2020   Chronic right shoulder pain 10/30/2019   Edema 04/03/2019   Dysphagia 10/18/2018   Otitis media 05/02/2018   Chronic serous otitis media 04/19/2018   Presbycusis of both ears 01/23/2018   Upper airway cough syndrome 09/25/2017   Snoring 08/29/2017   Benign essential tremor 08/29/2017   Nocturia more than twice per night 08/29/2017   Intractable episodic cluster headache 08/29/2017   Chronic tubotympanic suppurative otitis media, bilateral 05/05/2017   Acute suppr otitis media w spon rupt ear drum, recur, r ear 04/06/2017   Perennial allergic rhinitis 09/22/2016   Mastoiditis of both sides 09/21/2016   Chronic pansinusitis 09/21/2016   Foreign body of ear, left, initial encounter 07/25/2016   Eustachian tube dysfunction, bilateral 11/30/2015   Bilateral sensorineural hearing loss 11/30/2015   Heartburn 09/16/2014   Thrombocytopenia (HCC) 05/08/2013   Abdominal bruit 07/22/2010   Hyperthyroidism 07/12/2010   Internal hemorrhoids 07/12/2010   Hemorrhage of rectum and anus 07/12/2010   History of colonic polyps 07/12/2010   Type 2 diabetes mellitus with hyperglycemia, without long-term current use of insulin (HCC) 07/06/2010   Essential hypertension 12/11/2008   Angina pectoris 12/11/2008   CAD S/P percutaneous coronary angioplasty 12/11/2008   Hypothyroidism 07/10/2007   Hyperlipidemia associated with type 2 diabetes mellitus (HCC) 07/10/2007   Major depressive disorder 07/10/2007   Osteoarthritis of multiple joints 07/10/2007   Past Medical History:  Diagnosis Date   Abdominal bruit 07/22/2010   Acute sinusitis 03/26/2015   Acute  suppr otitis media w spon rupt ear drum, recur, r ear 04/06/2017   Allergy    Angina pectoris 12/11/2008   Benign essential tremor 08/29/2017   Bilateral sensorineural hearing loss 11/30/2015   CAD S/P percutaneous coronary angioplasty 12/11/2008   LAD PCI '03,  Cath 2010 showed 40% LAD- medical Rx  Myoview March 2020 negative for ischemia but positive for ST depression- Imdur added.  Formatting of this note might be different from the original. LAD PCI '03,  Cath 2010 showed 40% LAD- medical Rx Myoview March 2020 negative for ischemia but positive for ST depression- Imdur added.  Last Assessment & Plan:  Check labs con't meds Followed by car   Cataract    Dr. 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 Echo May 2020- EF 60-65% with mild LVH  Last Assessment & Plan:  Well controlled, no changes to meds. Encouraged heart healthy diet such as the DASH diet and exercise as tolerated.   Eustachian tube dysfunction, bilateral 11/30/2015   History of colonic polyps 07/12/2010   History of GI bleed 04/23/2020   History of left mastoidectomy 02/20/2020   History of placement of ear tubes    Hyperlipidemia associated with type 2 diabetes mellitus 07/10/2007   Hyperthyroidism 07/12/2010   Hypothyroidism 07/10/2007   Internal hemorrhoids    Intractable episodic cluster headache 08/29/2017   Major depressive disorder 07/10/2007   Mastoiditis of both sides 09/21/2016   Migraine headache    Mild neurocognitive disorder due to multiple etiologies 12/25/2020   Non-insulin dependent type 2 diabetes mellitus 07/06/2010   Osteoarthritis of multiple joints 07/10/2007   Perennial allergic rhinitis 09/22/2016   Postprocedural urinary retention    Presbycusis of both ears 01/23/2018   Skin cancer, basal cell    Thrombocytopenia    TIA (transient ischemic attack) 2006   per patient's report. He was  never officially given this diagnosis   Upper airway cough syndrome 09/25/2017   Past Surgical History:  Procedure Laterality Date   COLONOSCOPY  2011   coronary artery disease status post placement     of drug-eluting stent in the LAD in 2003,eEF 65% then   DEBRIDEMENT AND CLOSURE WOUND N/A 12/08/2020   Procedure: CLOSURE LEFT POST AURICULAR SCALP WOUND;  Surgeon: Glenna Fellows, MD;  Location: [facility];  Service: Plastics;  Laterality: N/A;   esophogeal dilation     fatty tissue removed     from neck 2003   HERNIA REPAIR  2007   Dr. Claud Kelp Patient # [phone]   mastoidectomy with tympanoplasty     MELANOMA EXCISION Left 11/17/2020   Procedure: WIDE LOCAL EXCISION, ADVANCED FLAP CLOSURE LEFT POSTERIOR EAR MELANOMA;  Surgeon: Almond Lint, MD;  Location: [facility];  Service: General;  Laterality: Left;   MELANOMA EXCISION Left 12/01/2020   Procedure: RE-EXCISION LEFT POSTERIOR AURICULAR MELANOMA WITH TEMPORARY CLOSURE;  Surgeon: Almond Lint, MD;  Location: [facility];  Service: General;  Laterality: Left;  60 MIN TOTAL   POLYPECTOMY  2011   +TA   right toe bone spur surgery  SKIN FULL THICKNESS GRAFT Left 12/08/2020   Procedure: Full thickness skin graft from left upper arm;  Surgeon: Glenna Fellows, MD;  Location: [facility];  Service: Plastics;  Laterality: Left;   THYROIDECTOMY, PARTIAL  07/2002   right thyroid   TONSILLECTOMY     UPPER GI ENDOSCOPY     Social History   Tobacco Use   Smoking status: Former    Current packs/day: 0.00    Types: Cigarettes    Start date: 10/10/1960    Quit date: 10/10/1965    Years since quitting: 58.1   Smokeless tobacco: Never  Vaping Use   Vaping status: Never Used  Substance Use Topics   Alcohol use: No   Drug use: No   Social History   Socioeconomic History   Marital status: Married    Spouse name: Charleen   Number of children: 0   Years of education: 14   Highest  education level: Associate degree: academic program  Occupational History   Occupation: retired    Associate Professor: RETIRED    Comment: accountant  Tobacco Use   Smoking status: Former    Current packs/day: 0.00    Types: Cigarettes    Start date: 10/10/1960    Quit date: 10/10/1965    Years since quitting: 58.1   Smokeless tobacco: Never  Vaping Use   Vaping status: Never Used  Substance and Sexual Activity   Alcohol use: No   Drug use: No   Sexual activity: Yes    Partners: Female  Other Topics Concern   Not on file  Social History Narrative   Pt lives with spouse at private home in 2 story home- he has no children- right handed- he drinks coffee daily, tea sometimes, soda not all the time. Exercise-- treadmill, stationary bike   Right handed   Social Drivers of Health   Financial Resource Strain: Low Risk  (11/13/2023)   Overall Financial Resource Strain (CARDIA)    Difficulty of Paying Living Expenses: Not hard at all  Food Insecurity: No Food Insecurity (11/13/2023)   Hunger Vital Sign    Worried About Running Out of Food in the Last Year: Never true    Ran Out of Food in the Last Year: Never true  Transportation Needs: No Transportation Needs (11/13/2023)   PRAPARE - Administrator, Civil Service (Medical): No    Lack of Transportation (Non-Medical): No  Physical Activity: Sufficiently Active (11/13/2023)   Exercise Vital Sign    Days of Exercise per Week: 7 days    Minutes of Exercise per Session: 40 min  Stress: No Stress Concern Present (11/13/2023)   [employer] of Occupational Health - Occupational Stress Questionnaire    Feeling of Stress : Not at all  Social Connections: Socially Integrated (11/13/2023)   Social Connection and Isolation Panel [NHANES]    Frequency of Communication with Friends and Family: More than three times a week    Frequency of Social Gatherings with Friends and Family: More than three times a week    Attends Religious Services: More than  4 times per year    Active Member of [employer] or Organizations: Yes    Attends Banker Meetings: 1 to 4 times per year    Marital Status: Married  Catering manager Violence: Not At Risk (11/14/2023)   Humiliation, Afraid, Rape, and Kick questionnaire    Fear of Current or Ex-Partner: No    Emotionally Abused: No    Physically Abused:  No    Sexually Abused: No   Family Status  Relation Name Status   Mother Clare Charon Mathieson Deceased at age 17   Father Chukwudi Ewen, Sr. Deceased at age 71   Brother Malakie Balis Alive   Brother Elbert Rana Adorno Alive   Brother  Deceased at age 59   Neg Hx  (Not Specified)  No partnership data on file   Family History  Problem Relation Age of Onset   Heart disease Mother    Alzheimer's disease Mother    Arthritis Mother    Heart disease Father        triple by pass   Hearing loss Father    Hypertension Father    Hypertension Brother        Prostate Cancer   Prostate cancer Brother    Alcohol abuse Brother    Hypertension Brother    Asthma Brother    Hypertension Brother    COPD Brother    Colon cancer Neg Hx    Colon polyps Neg Hx    Rectal cancer Neg Hx    Stomach cancer Neg Hx    Esophageal cancer Neg Hx    Allergies  Allergen Reactions   Crestor [Rosuvastatin Calcium] Other (See Comments)    Severe joint and muscle pain   Nitrofurantoin Swelling    Ankles/legs SOB   Rosuvastatin Other (See Comments)    REACTION: REALLY BAD JOINT/MUSCLE PAIN   Sulfa Antibiotics Other (See Comments)    Severe crippling  joint and muscle pain and redness   Sulfamethoxazole-Trimethoprim Other (See Comments)    Joint and muscle pain   Sulfasalazine Other (See Comments)    Joint and muscle pain   Amlodipine Swelling   Codeine Other (See Comments)    Unknown   Hydrocodone-Acetaminophen Other (See Comments)    To strong vicodin    Other     Apples and tomatoes...... Nausea and migraines.        Review of Systems   Constitutional:  Negative for fever and malaise/fatigue.  HENT:  Negative for congestion.   Eyes:  Negative for blurred vision.  Respiratory:  Negative for cough and shortness of breath.   Cardiovascular:  Negative for chest pain, palpitations and leg swelling.  Gastrointestinal:  Negative for abdominal pain, blood in stool, nausea and vomiting.  Genitourinary:  Negative for dysuria and frequency.  Musculoskeletal:  Negative for back pain and falls.  Skin:  Negative for rash.  Neurological:  Negative for dizziness, loss of consciousness and headaches.  Endo/Heme/Allergies:  Negative for environmental allergies.  Psychiatric/Behavioral:  Negative for depression. The patient is not nervous/anxious.       Objective:     BP (!) 150/68   Pulse 60   Temp 97.9 F (36.6 C)   Resp 18   Ht 5\' 9"  (1.753 m)   Wt 158 lb (71.7 kg)   SpO2 97%   BMI 23.33 kg/m  BP Readings from Last 3 Encounters:  11/23/23 (!) 150/68  11/14/23 (!) 167/72  07/27/23 (!) 140/64   Wt Readings from Last 3 Encounters:  11/23/23 158 lb (71.7 kg)  11/14/23 158 lb 6.4 oz (71.8 kg)  07/27/23 158 lb (71.7 kg)   SpO2 Readings from Last 3 Encounters:  11/23/23 97%  07/27/23 96%  05/23/23 96%      Physical Exam Vitals and nursing note reviewed.  Constitutional:      General: He is not in acute distress.    Appearance:  Normal appearance. He is well-developed.  HENT:     Head: Normocephalic and atraumatic.  Eyes:     General: No scleral icterus.       Right eye: No discharge.        Left eye: No discharge.  Cardiovascular:     Rate and Rhythm: Normal rate and regular rhythm.     Heart sounds: No murmur heard. Pulmonary:     Effort: Pulmonary effort is normal. No respiratory distress.     Breath sounds: Normal breath sounds.  Musculoskeletal:        General: Normal range of motion.     Cervical back: Normal range of motion and neck supple.     Right lower leg: No edema.     Left lower leg: No  edema.  Skin:    General: Skin is warm and dry.  Neurological:     General: No focal deficit present.     Mental Status: He is alert and oriented to person, place, and time.  Psychiatric:        Mood and Affect: Mood normal.        Behavior: Behavior normal.        Thought Content: Thought content normal.        Judgment: Judgment normal.      No results found for any visits on 11/23/23.  Last CBC Lab Results  Component Value Date   WBC 5.2 07/06/2023   HGB 15.5 07/06/2023   HCT 45.9 07/06/2023   MCV 95.6 07/06/2023   MCH 32.9 11/19/2021   RDW 12.6 07/06/2023   PLT 128.0 (L) 07/06/2023   Last metabolic panel Lab Results  Component Value Date   GLUCOSE 107 (H) 05/24/2023   NA 136 05/24/2023   K 4.1 05/24/2023   CL 106 05/24/2023   CO2 24 05/24/2023   BUN 27 (H) 05/24/2023   CREATININE 1.37 05/24/2023   GFR 49.12 (L) 05/24/2023   CALCIUM 9.0 05/24/2023   PROT 6.9 05/24/2023   ALBUMIN 4.2 05/24/2023   LABGLOB 1.9 07/01/2019   AGRATIO 1.6 07/01/2019   BILITOT 0.8 05/24/2023   ALKPHOS 57 05/24/2023   AST 14 05/24/2023   ALT 15 05/24/2023   ANIONGAP 9 11/19/2021   Last lipids Lab Results  Component Value Date   CHOL 91 05/24/2023   HDL 42.60 05/24/2023   LDLCALC 38 05/24/2023   TRIG 50.0 05/24/2023   CHOLHDL 2 05/24/2023   Last hemoglobin A1c Lab Results  Component Value Date   HGBA1C 5.1 05/24/2023   Last thyroid functions Lab Results  Component Value Date   TSH 1.22 05/24/2023   Last vitamin D Lab Results  Component Value Date   VD25OH 51.82 10/23/2019   Last vitamin B12 and Folate Lab Results  Component Value Date   VITAMINB12 566 11/23/2011   FOLATE >20.0 11/23/2011      The ASCVD Risk score (Arnett DK, et al., 2019) failed to calculate for the following reasons:   The valid total cholesterol range is 130 to 320 mg/dL    Assessment & Plan:   Problem List Items Addressed This Visit       Unprioritized   Cutaneous melanoma    Type 2 diabetes mellitus with hyperglycemia, without long-term current use of insulin (HCC)   Relevant Orders   Hemoglobin A1c   Microalbumin / creatinine urine ratio   Hypothyroidism   Relevant Orders   TSH   Hyperlipidemia associated with type 2 diabetes mellitus (HCC)  Relevant Orders   Lipid panel   Comprehensive metabolic panel   Stage 3a chronic kidney disease (HCC)   Other Visit Diagnoses       Low platelet count (HCC)    -  Primary   Relevant Orders   CBC with Differential/Platelet     Primary hypertension       Relevant Orders   Lipid panel   CBC with Differential/Platelet   Comprehensive metabolic panel     Assessment and Plan    Hypertension Intermittent elevated blood pressure readings have been noted, with recent measurements of 125/60 and 150/?Marland Kitchen Antihypertensive medication is being taken. Regular monitoring and adherence to medication were emphasized. Blood pressure should be monitored regularly, and the current antihypertensive medication should be continued. Labs are ordered at [facility] for tomorrow.  Hyperlipidemia Simvastatin dosage was recently reduced to half to monitor platelet levels. Lab results are pending to assess efficacy and safety, with a potential dosage adjustment based on these results. Labs at [facility] are ordered for tomorrow to check lipid profile and platelet levels.  General Health Maintenance Flu and pneumonia vaccinations have been administered. Due to the high prevalence of flu and COVID-19 in the community, avoiding crowded places is encouraged to reduce infection risk.  Follow-up A follow-up appointment is scheduled in six months.        Return in about 6 months (around 05/22/2024), or if symptoms worsen or fail to improve.    Donato Schultz, DO

## 2023-11-24 ENCOUNTER — Other Ambulatory Visit: Payer: Medicare Other

## 2023-11-24 DIAGNOSIS — E785 Hyperlipidemia, unspecified: Secondary | ICD-10-CM

## 2023-11-24 DIAGNOSIS — E1165 Type 2 diabetes mellitus with hyperglycemia: Secondary | ICD-10-CM

## 2023-11-24 DIAGNOSIS — E039 Hypothyroidism, unspecified: Secondary | ICD-10-CM

## 2023-11-24 DIAGNOSIS — D696 Thrombocytopenia, unspecified: Secondary | ICD-10-CM | POA: Diagnosis not present

## 2023-11-24 DIAGNOSIS — I10 Essential (primary) hypertension: Secondary | ICD-10-CM

## 2023-11-24 DIAGNOSIS — E1169 Type 2 diabetes mellitus with other specified complication: Secondary | ICD-10-CM

## 2023-11-24 LAB — CBC WITH DIFFERENTIAL/PLATELET
Basophils Absolute: 0.1 10*3/uL (ref 0.0–0.1)
Basophils Relative: 0.9 % (ref 0.0–3.0)
Eosinophils Absolute: 0.3 10*3/uL (ref 0.0–0.7)
Eosinophils Relative: 4.8 % (ref 0.0–5.0)
HCT: 46.4 % (ref 39.0–52.0)
Hemoglobin: 15.9 g/dL (ref 13.0–17.0)
Lymphocytes Relative: 11.9 % — ABNORMAL LOW (ref 12.0–46.0)
Lymphs Abs: 0.7 10*3/uL (ref 0.7–4.0)
MCHC: 34.2 g/dL (ref 30.0–36.0)
MCV: 95.4 fL (ref 78.0–100.0)
Monocytes Absolute: 0.6 10*3/uL (ref 0.1–1.0)
Monocytes Relative: 11 % (ref 3.0–12.0)
Neutro Abs: 4.1 10*3/uL (ref 1.4–7.7)
Neutrophils Relative %: 71.4 % (ref 43.0–77.0)
Platelets: 124 10*3/uL — ABNORMAL LOW (ref 150.0–400.0)
RBC: 4.87 Mil/uL (ref 4.22–5.81)
RDW: 13.2 % (ref 11.5–15.5)
WBC: 5.8 10*3/uL (ref 4.0–10.5)

## 2023-11-24 LAB — COMPREHENSIVE METABOLIC PANEL
ALT: 16 U/L (ref 0–53)
AST: 15 U/L (ref 0–37)
Albumin: 4 g/dL (ref 3.5–5.2)
Alkaline Phosphatase: 56 U/L (ref 39–117)
BUN: 32 mg/dL — ABNORMAL HIGH (ref 6–23)
CO2: 25 meq/L (ref 19–32)
Calcium: 8.8 mg/dL (ref 8.4–10.5)
Chloride: 108 meq/L (ref 96–112)
Creatinine, Ser: 1.55 mg/dL — ABNORMAL HIGH (ref 0.40–1.50)
GFR: 42.21 mL/min — ABNORMAL LOW (ref >60.00)
Glucose, Bld: 106 mg/dL — ABNORMAL HIGH (ref 70–99)
Potassium: 4.2 meq/L (ref 3.5–5.1)
Sodium: 142 meq/L (ref 135–145)
Total Bilirubin: 0.7 mg/dL (ref 0.2–1.2)
Total Protein: 7 g/dL (ref 6.0–8.3)

## 2023-11-24 LAB — LIPID PANEL
Cholesterol: 80 mg/dL (ref 0–200)
HDL: 38.2 mg/dL — ABNORMAL LOW (ref >39.00)
LDL Cholesterol: 34 mg/dL (ref 0–99)
NonHDL: 42.14
Total CHOL/HDL Ratio: 2
Triglycerides: 40 mg/dL (ref 0.0–149.0)
VLDL: 8 mg/dL (ref 0.0–40.0)

## 2023-11-24 LAB — MICROALBUMIN / CREATININE URINE RATIO
Creatinine,U: 162.4 mg/dL
Microalb Creat Ratio: 29.2 mg/g (ref 0.0–30.0)
Microalb, Ur: 4.7 mg/dL — ABNORMAL HIGH (ref 0.0–1.9)

## 2023-11-24 LAB — TSH: TSH: 2.19 u[IU]/mL (ref 0.35–5.50)

## 2023-11-24 LAB — HEMOGLOBIN A1C: Hgb A1c MFr Bld: 5.2 % (ref 4.6–6.5)

## 2023-11-30 ENCOUNTER — Encounter: Payer: Self-pay | Admitting: Family Medicine

## 2023-12-05 DIAGNOSIS — N2581 Secondary hyperparathyroidism of renal origin: Secondary | ICD-10-CM | POA: Diagnosis not present

## 2023-12-05 DIAGNOSIS — D631 Anemia in chronic kidney disease: Secondary | ICD-10-CM | POA: Diagnosis not present

## 2023-12-05 DIAGNOSIS — N1832 Chronic kidney disease, stage 3b: Secondary | ICD-10-CM | POA: Diagnosis not present

## 2023-12-05 DIAGNOSIS — E1122 Type 2 diabetes mellitus with diabetic chronic kidney disease: Secondary | ICD-10-CM | POA: Diagnosis not present

## 2023-12-05 DIAGNOSIS — N189 Chronic kidney disease, unspecified: Secondary | ICD-10-CM | POA: Diagnosis not present

## 2023-12-05 DIAGNOSIS — I129 Hypertensive chronic kidney disease with stage 1 through stage 4 chronic kidney disease, or unspecified chronic kidney disease: Secondary | ICD-10-CM | POA: Diagnosis not present

## 2023-12-06 ENCOUNTER — Other Ambulatory Visit: Payer: Self-pay | Admitting: Family Medicine

## 2023-12-06 DIAGNOSIS — E785 Hyperlipidemia, unspecified: Secondary | ICD-10-CM

## 2023-12-28 DIAGNOSIS — H90A32 Mixed conductive and sensorineural hearing loss, unilateral, left ear with restricted hearing on the contralateral side: Secondary | ICD-10-CM | POA: Diagnosis not present

## 2023-12-28 DIAGNOSIS — H6993 Unspecified Eustachian tube disorder, bilateral: Secondary | ICD-10-CM | POA: Diagnosis not present

## 2023-12-28 DIAGNOSIS — Z9089 Acquired absence of other organs: Secondary | ICD-10-CM | POA: Diagnosis not present

## 2023-12-28 DIAGNOSIS — Z9622 Myringotomy tube(s) status: Secondary | ICD-10-CM | POA: Diagnosis not present

## 2023-12-28 DIAGNOSIS — H7312 Chronic myringitis, left ear: Secondary | ICD-10-CM | POA: Diagnosis not present

## 2023-12-28 DIAGNOSIS — H90A21 Sensorineural hearing loss, unilateral, right ear, with restricted hearing on the contralateral side: Secondary | ICD-10-CM | POA: Diagnosis not present

## 2023-12-28 DIAGNOSIS — Z4589 Encounter for adjustment and management of other implanted devices: Secondary | ICD-10-CM | POA: Diagnosis not present

## 2023-12-28 DIAGNOSIS — Z9889 Other specified postprocedural states: Secondary | ICD-10-CM | POA: Diagnosis not present

## 2023-12-28 DIAGNOSIS — H906 Mixed conductive and sensorineural hearing loss, bilateral: Secondary | ICD-10-CM | POA: Diagnosis not present

## 2024-01-01 ENCOUNTER — Other Ambulatory Visit: Payer: Self-pay | Admitting: Family Medicine

## 2024-01-24 NOTE — Progress Notes (Signed)
 HPI: FU CAD with prior PCI of his LAD in May 2003. Cardiac cath 2010 showed 30-40% LAD just distal to the stent. There was no other obstructive disease noted. The ejection fraction is 50%. He has been treated medically. Abdominal ultrasound in Oct 2011 showed no aneurysm. Nuclear study in March 2020 showed ejection fraction 56%.  Patient had significant ST changes but perfusion was normal. Echocardiogram May 2020 showed normal LV function, mild left atrial enlargement.  Cardiac MRI October 2020 showed basal anteroseptal enhancement which could be seen with prior myocarditis but also could be seen with sarcoid. Pattern not consistent with amyloid.  Ejection fraction 61% and normal. Carotid Dopplers February 2023 showed 1 to 39% bilateral stenosis. Renal Dopplers August 2023 showed no renal artery stenosis. Since I last saw him, he notes mild chest discomfort with vigorous activities relieved with rest.  He does not have these with normal activities or at rest.  He denies dyspnea or syncope.  Current Outpatient Medications  Medication Sig Dispense Refill   AMBULATORY NON FORMULARY MEDICATION Collagen Peptides     aspirin  81 MG tablet Take 81 mg by mouth daily.     azelastine  (ASTELIN ) 0.1 % nasal spray Place 1 spray into both nostrils daily as needed for rhinitis. Use in each nostril as directed     Boswellia Serrata (BOSWELLIA PO) Take 500 mg by mouth 3 (three) times daily.     cholecalciferol (VITAMIN D) 25 MCG (1000 UNIT) tablet Take 3,000 Units by mouth 3 (three) times daily.     Cinnamon 500 MG capsule Take 1,000 mg by mouth 2 (two) times daily.     ciprofloxacin  (CILOXAN ) 0.3 % ophthalmic solution For ears     Coenzyme Q10 (COQ-10) 100 MG capsule Take 1 capsule (100 mg total) by mouth daily.     Cyanocobalamin (VITAMIN B-12) 1000 MCG SUBL Take 1,000 mcg by mouth daily.     dexamethasone  (DECADRON ) 0.1 % ophthalmic solution For ears     Diclofenac  Sodium 3 % GEL Qid prn (Patient taking  differently: Apply 1 application  topically daily as needed (Arthritis).) 100 g 3   ezetimibe  (ZETIA ) 10 MG tablet TAKE 1 TABLET BY MOUTH DAILY 90 tablet 3   Ginger, Zingiber officinalis, (GINGER ROOT) 550 MG CAPS Take 550 mg by mouth 2 (two) times daily.     hydrALAZINE  (APRESOLINE ) 100 MG tablet TAKE 1 TABLET BY MOUTH 3 TIMES A DAY 270 tablet 11   levothyroxine  (SYNTHROID ) 112 MCG tablet Take 1 tablet (112 mcg total) by mouth daily before breakfast. 90 tablet 3   metFORMIN  (GLUCOPHAGE ) 500 MG tablet TAKE 1 TABLET BY MOUTH TWICE A DAY WITH MEALS 180 tablet 3   Multiple Vitamins-Minerals (THERAGRAN-M ADVANCED 50 PLUS PO) Take 1 tablet by mouth daily.     Nerve Stimulator (CEFALY KIT) DEVI Apply 1 application topically daily at 12 noon. Electrical migraine kit for prevention on treatment of migraine headache     olmesartan  (BENICAR ) 40 MG tablet TAKE 1 TABLET BY MOUTH DAILY 90 tablet 2   pantoprazole  (PROTONIX ) 40 MG tablet TAKE 1 TABLET BY MOUTH DAILY 90 tablet 1   propranolol  ER (INDERAL  LA) 160 MG SR capsule Take 1 capsule (160 mg total) by mouth daily. 90 capsule 3   simvastatin  (ZOCOR ) 20 MG tablet TAKE 1 TABLET BY MOUTH AT BEDTIME 90 tablet 0   tamsulosin  (FLOMAX ) 0.4 MG CAPS capsule Take 0.4 mg by mouth daily. 30 capsule    topiramate  (TOPAMAX )  50 MG tablet TAKE 1 TABLET BY MOUTH EVERY NIGHT AT BEDTIME 90 tablet 1   triamcinolone cream (KENALOG) 0.1 % Apply 1 Application topically as needed.     Biotin 1000 MCG tablet Take 1,000 mcg by mouth in the morning, at noon, and at bedtime. (Patient not taking: Reported on 01/30/2024)     No current facility-administered medications for this visit.     Past Medical History:  Diagnosis Date   Abdominal bruit 07/22/2010   Acute sinusitis 03/26/2015   Acute suppr otitis media w spon rupt ear drum, recur, r ear 04/06/2017   Allergy    Angina pectoris 12/11/2008   Benign essential tremor 08/29/2017   Bilateral sensorineural hearing loss  11/30/2015   CAD S/P percutaneous coronary angioplasty 12/11/2008   LAD PCI '03,  Cath 2010 showed 40% LAD- medical Rx Myoview  March 2020 negative for ischemia but positive for ST depression- Imdur  added.  Formatting of this note might be different from the original. LAD PCI '03,  Cath 2010 showed 40% LAD- medical Rx Myoview  March 2020 negative for ischemia but positive for ST depression- Imdur  added.  Last Assessment & Plan:  Check labs con't meds Followed by car   Cataract    Dr. 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 Echo May 2020- EF 60-65% with mild LVH  Last Assessment & Plan:  Well controlled, no changes to meds. Encouraged heart healthy diet such as the DASH diet and exercise as tolerated.   Eustachian tube dysfunction, bilateral 11/30/2015   History of colonic polyps 07/12/2010   History of GI bleed 04/23/2020   History of left mastoidectomy 02/20/2020   History of placement of ear tubes    Hyperlipidemia associated with type 2 diabetes mellitus 07/10/2007   Hyperthyroidism 07/12/2010   Hypothyroidism 07/10/2007   Internal hemorrhoids    Intractable episodic cluster headache 08/29/2017   Major depressive disorder 07/10/2007   Mastoiditis of both sides 09/21/2016   Migraine headache    Mild neurocognitive disorder due to multiple etiologies 12/25/2020   Non-insulin  dependent type 2 diabetes mellitus 07/06/2010   Osteoarthritis of multiple joints 07/10/2007   Perennial allergic rhinitis 09/22/2016   Postprocedural urinary retention    Presbycusis of both ears 01/23/2018    Skin cancer, basal cell    Thrombocytopenia    TIA (transient ischemic attack) 2006   per patient's report. He was never officially given this diagnosis   Upper airway cough syndrome 09/25/2017    Past Surgical History:  Procedure Laterality Date   COLONOSCOPY  2011   coronary artery disease status post placement     of drug-eluting stent in the LAD in 2003,eEF 65% then   DEBRIDEMENT AND CLOSURE WOUND N/A 12/08/2020   Procedure: CLOSURE LEFT POST AURICULAR SCALP WOUND;  Surgeon: Alger Infield, MD;  Location: [facility];  Service: Plastics;  Laterality: N/A;   esophogeal dilation     fatty tissue removed     from neck 2003   HERNIA REPAIR  2007   Dr. Boyce Byes Patient # [phone]   mastoidectomy with tympanoplasty  MELANOMA EXCISION Left 11/17/2020   Procedure: WIDE LOCAL EXCISION, ADVANCED FLAP CLOSURE LEFT POSTERIOR EAR MELANOMA;  Surgeon: Lockie Rima, MD;  Location: [facility];  Service: General;  Laterality: Left;   MELANOMA EXCISION Left 12/01/2020   Procedure: RE-EXCISION LEFT POSTERIOR AURICULAR MELANOMA WITH TEMPORARY CLOSURE;  Surgeon: Lockie Rima, MD;  Location: [facility];  Service: General;  Laterality: Left;  60 MIN TOTAL   POLYPECTOMY  2011   +TA   right toe bone spur surgery     SKIN FULL THICKNESS GRAFT Left 12/08/2020   Procedure: Full thickness skin graft from left upper arm;  Surgeon: Alger Infield, MD;  Location: [facility];  Service: Plastics;  Laterality: Left;   THYROIDECTOMY, PARTIAL  07/2002   right thyroid    TONSILLECTOMY     UPPER GI ENDOSCOPY      Social History   Socioeconomic History   Marital status: Married    Spouse name: Charleen   Number of children: 0   Years of education: 14   Highest education level: Associate degree: academic program  Occupational History   Occupation: retired    Associate Professor: RETIRED    Comment: accountant  Tobacco Use   Smoking status: Former    Current  packs/day: 0.00    Types: Cigarettes    Start date: 10/10/1960    Quit date: 10/10/1965    Years since quitting: 58.3   Smokeless tobacco: Never  Vaping Use   Vaping status: Never Used  Substance and Sexual Activity   Alcohol use: No   Drug use: No   Sexual activity: Yes    Partners: Female  Other Topics Concern   Not on file  Social History Narrative   Pt lives with spouse at private home in 2 story home- he has no children- right handed- he drinks coffee daily, tea sometimes, soda not all the time. Exercise-- treadmill, stationary bike   Right handed   Social Drivers of Health   Financial Resource Strain: Low Risk  (11/13/2023)   Overall Financial Resource Strain (CARDIA)    Difficulty of Paying Living Expenses: Not hard at all  Food Insecurity: Low Risk  (12/28/2023)   Received from [facility]   Hunger Vital Sign    Worried About Running Out of Food in the Last Year: Never true    Ran Out of Food in the Last Year: Never true  Transportation Needs: No Transportation Needs (12/28/2023)   Received from [facility]    In the past 12 months, has lack of reliable transportation kept you from medical appointments, meetings, work or from getting things needed for daily living? : No  Physical Activity: Sufficiently Active (11/13/2023)   Exercise Vital Sign    Days of Exercise per Week: 7 days    Minutes of Exercise per Session: 40 min  Stress: No Stress Concern Present (11/13/2023)   [employer] of Occupational Health - Occupational Stress Questionnaire    Feeling of Stress : Not at all  Social Connections: Socially Integrated (11/13/2023)   Social Connection and Isolation Panel [NHANES]    Frequency of Communication with Friends and Family: More than three times a week    Frequency of Social Gatherings with Friends and Family: More than three times a week    Attends Religious Services: More than 4 times per year    Active Member of [employer] or Organizations: Yes     Attends Banker Meetings: 1 to 4 times per  year    Marital Status: Married  Catering manager Violence: Not At Risk (11/14/2023)   Humiliation, Afraid, Rape, and Kick questionnaire    Fear of Current or Ex-Partner: No    Emotionally Abused: No    Physically Abused: No    Sexually Abused: No    Family History  Problem Relation Age of Onset   Heart disease Mother    Alzheimer's disease Mother    Arthritis Mother    Heart disease Father        triple by pass   Hearing loss Father    Hypertension Father    Hypertension Brother        Prostate Cancer   Prostate cancer Brother    Alcohol abuse Brother    Hypertension Brother    Asthma Brother    Hypertension Brother    COPD Brother    Colon cancer Neg Hx    Colon polyps Neg Hx    Rectal cancer Neg Hx    Stomach cancer Neg Hx    Esophageal cancer Neg Hx     ROS: no fevers or chills, productive cough, hemoptysis, dysphasia, odynophagia, melena, hematochezia, dysuria, hematuria, rash, seizure activity, orthopnea, PND, pedal edema, claudication. Remaining systems are negative.  Physical Exam: Well-developed well-nourished in no acute distress.  Skin is warm and dry.  HEENT is normal.  Neck is supple.  Chest is clear to auscultation with normal expansion.  Cardiovascular exam is regular rate and rhythm.  Abdominal exam nontender or distended. No masses palpated. Extremities show no edema. neuro grossly intact   A/P  1 coronary artery disease-Continue medical therapy with aspirin  and statin.  Patient is having mild chest discomfort with vigorous activities relieved with rest concerning for recurrent angina.  He does not have this with routine activities or at rest.  I will add isosorbide  30 mg daily.  We will arrange Lexiscan nuclear study to screen for significant ischemia.  If his symptoms worsen may require cardiac catheterization.  2 hypertension-BP controlled; continue present meds and follow.  3  hyperlipidemia-continue Zocor  and Zetia .  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  Alexandria Angel, MD

## 2024-01-30 ENCOUNTER — Ambulatory Visit: Payer: Medicare Other | Attending: Cardiology | Admitting: Cardiology

## 2024-01-30 ENCOUNTER — Encounter: Payer: Self-pay | Admitting: Cardiology

## 2024-01-30 VITALS — BP 132/70 | HR 66 | Ht 69.0 in | Wt 162.0 lb

## 2024-01-30 DIAGNOSIS — R072 Precordial pain: Secondary | ICD-10-CM | POA: Diagnosis not present

## 2024-01-30 DIAGNOSIS — E785 Hyperlipidemia, unspecified: Secondary | ICD-10-CM | POA: Diagnosis not present

## 2024-01-30 DIAGNOSIS — I10 Essential (primary) hypertension: Secondary | ICD-10-CM | POA: Diagnosis not present

## 2024-01-30 DIAGNOSIS — I2510 Atherosclerotic heart disease of native coronary artery without angina pectoris: Secondary | ICD-10-CM | POA: Diagnosis not present

## 2024-01-30 MED ORDER — ISOSORBIDE MONONITRATE ER 30 MG PO TB24: 30.0000 mg | ORAL_TABLET | Freq: Every day | ORAL | 3 refills | Status: AC

## 2024-01-30 NOTE — Patient Instructions (Signed)
 Medication Instructions:   START ISOSORBIDE  30 MG ONCE DAILY  *If you need a refill on your cardiac medications before your next appointment, please call your pharmacy*   Testing/Procedures:  .Your physician has requested that you have a lexiscan myoview . For further information please visit [email or web address] Please follow instruction sheet, as given.   Follow-Up: At [facility], you and your health needs are our priority.  As part of our continuing mission to provide you with exceptional heart care, our providers are all part of one team.  This team includes your primary Cardiologist (physician) and Advanced Practice Providers or APPs (Physician Assistants and Nurse Practitioners) who all work together to provide you with the care you need, when you need it.  Your next appointment:   3 month(s)  Provider:   Alexandria Angel, MD           1st Floor: - Lobby - Registration  - Pharmacy  - Lab - Cafe  2nd Floor: - PV Lab - Diagnostic Testing (echo, CT, nuclear med)  3rd Floor: - Vacant  4th Floor: - TCTS (cardiothoracic surgery) - AFib Clinic - Structural Heart Clinic - Vascular Surgery  - Vascular Ultrasound  5th Floor: - [facility] Cardiology (general and EP) - Clinical Pharmacy for coumadin, hypertension, lipid, weight-loss medications, and med management appointments    Valet parking services will be available as well.

## 2024-02-01 ENCOUNTER — Encounter (HOSPITAL_COMMUNITY): Payer: Self-pay

## 2024-02-03 ENCOUNTER — Other Ambulatory Visit: Payer: Self-pay | Admitting: Family Medicine

## 2024-02-09 ENCOUNTER — Other Ambulatory Visit: Payer: Self-pay | Admitting: Cardiology

## 2024-02-09 DIAGNOSIS — R072 Precordial pain: Secondary | ICD-10-CM

## 2024-02-13 ENCOUNTER — Ambulatory Visit (HOSPITAL_COMMUNITY): Attending: Cardiology

## 2024-02-13 DIAGNOSIS — R072 Precordial pain: Secondary | ICD-10-CM | POA: Diagnosis not present

## 2024-02-13 DIAGNOSIS — I2510 Atherosclerotic heart disease of native coronary artery without angina pectoris: Secondary | ICD-10-CM | POA: Diagnosis not present

## 2024-02-13 MED ORDER — TECHNETIUM TC 99M TETROFOSMIN IV KIT
32.4000 | PACK | Freq: Once | INTRAVENOUS | Status: AC | PRN
Start: 2024-02-13 — End: 2024-02-13
  Administered 2024-02-13: 32.4 via INTRAVENOUS

## 2024-02-13 MED ORDER — REGADENOSON 0.4 MG/5ML IV SOLN
INTRAVENOUS | Status: AC
  Filled 2024-02-13: qty 5

## 2024-02-13 MED ORDER — TECHNETIUM TC 99M TETROFOSMIN IV KIT
9.8000 | PACK | Freq: Once | INTRAVENOUS | Status: AC | PRN
  Administered 2024-02-13: 9.8 via INTRAVENOUS

## 2024-02-13 MED ORDER — REGADENOSON 0.4 MG/5ML IV SOLN
0.4000 mg | Freq: Once | INTRAVENOUS | Status: AC
  Administered 2024-02-13: 0.4 mg via INTRAVENOUS

## 2024-02-14 ENCOUNTER — Encounter: Payer: Self-pay | Admitting: *Deleted

## 2024-02-14 ENCOUNTER — Other Ambulatory Visit: Payer: Self-pay | Admitting: Family Medicine

## 2024-02-14 DIAGNOSIS — R972 Elevated prostate specific antigen [PSA]: Secondary | ICD-10-CM | POA: Diagnosis not present

## 2024-02-14 DIAGNOSIS — E1165 Type 2 diabetes mellitus with hyperglycemia: Secondary | ICD-10-CM

## 2024-02-14 LAB — MYOCARDIAL PERFUSION IMAGING
Base ST Depression (mm): 0 mm
LV dias vol: 104 mL (ref 62–150)
LV sys vol: 29 mL
Nuc Stress EF: 72 %
Peak HR: 85 {beats}/min
Rest HR: 59 {beats}/min
Rest Nuclear Isotope Dose: 9.7 mCi
SDS: 0
SRS: 0
SSS: 0
ST Depression (mm): 0 mm
Stress Nuclear Isotope Dose: 32.4 mCi
TID: 1.06

## 2024-02-21 DIAGNOSIS — R972 Elevated prostate specific antigen [PSA]: Secondary | ICD-10-CM | POA: Diagnosis not present

## 2024-02-21 DIAGNOSIS — R3912 Poor urinary stream: Secondary | ICD-10-CM | POA: Diagnosis not present

## 2024-02-21 DIAGNOSIS — N401 Enlarged prostate with lower urinary tract symptoms: Secondary | ICD-10-CM | POA: Diagnosis not present

## 2024-02-24 ENCOUNTER — Ambulatory Visit: Payer: Self-pay | Admitting: Cardiology

## 2024-03-12 DIAGNOSIS — H2513 Age-related nuclear cataract, bilateral: Secondary | ICD-10-CM | POA: Diagnosis not present

## 2024-03-12 DIAGNOSIS — H532 Diplopia: Secondary | ICD-10-CM | POA: Diagnosis not present

## 2024-03-12 DIAGNOSIS — H5010 Unspecified exotropia: Secondary | ICD-10-CM | POA: Diagnosis not present

## 2024-03-12 DIAGNOSIS — H40021 Open angle with borderline findings, high risk, right eye: Secondary | ICD-10-CM | POA: Diagnosis not present

## 2024-03-26 DIAGNOSIS — D0472 Carcinoma in situ of skin of left lower limb, including hip: Secondary | ICD-10-CM | POA: Diagnosis not present

## 2024-03-26 DIAGNOSIS — L821 Other seborrheic keratosis: Secondary | ICD-10-CM | POA: Diagnosis not present

## 2024-03-26 DIAGNOSIS — L814 Other melanin hyperpigmentation: Secondary | ICD-10-CM | POA: Diagnosis not present

## 2024-03-26 DIAGNOSIS — D492 Neoplasm of unspecified behavior of bone, soft tissue, and skin: Secondary | ICD-10-CM | POA: Diagnosis not present

## 2024-03-26 DIAGNOSIS — D0471 Carcinoma in situ of skin of right lower limb, including hip: Secondary | ICD-10-CM | POA: Diagnosis not present

## 2024-03-26 DIAGNOSIS — L570 Actinic keratosis: Secondary | ICD-10-CM | POA: Diagnosis not present

## 2024-03-26 DIAGNOSIS — D225 Melanocytic nevi of trunk: Secondary | ICD-10-CM | POA: Diagnosis not present

## 2024-03-26 DIAGNOSIS — L249 Irritant contact dermatitis, unspecified cause: Secondary | ICD-10-CM | POA: Diagnosis not present

## 2024-03-29 ENCOUNTER — Other Ambulatory Visit: Payer: Self-pay | Admitting: Neurology

## 2024-04-05 DIAGNOSIS — H5010 Unspecified exotropia: Secondary | ICD-10-CM | POA: Diagnosis not present

## 2024-04-05 DIAGNOSIS — E119 Type 2 diabetes mellitus without complications: Secondary | ICD-10-CM | POA: Diagnosis not present

## 2024-04-05 DIAGNOSIS — H2513 Age-related nuclear cataract, bilateral: Secondary | ICD-10-CM | POA: Diagnosis not present

## 2024-04-05 DIAGNOSIS — H5203 Hypermetropia, bilateral: Secondary | ICD-10-CM | POA: Diagnosis not present

## 2024-04-05 DIAGNOSIS — H40011 Open angle with borderline findings, low risk, right eye: Secondary | ICD-10-CM | POA: Diagnosis not present

## 2024-04-05 LAB — HM DIABETES EYE EXAM

## 2024-04-16 NOTE — Progress Notes (Signed)
 HPI: FU CAD with prior PCI of his LAD in May 2003. Cardiac cath 2010 showed 30-40% LAD just distal to the stent. There was no other obstructive disease noted. The ejection fraction is 50%. He has been treated medically. Abdominal ultrasound in Oct 2011 showed no aneurysm. Echocardiogram May 2020 showed normal LV function, mild left atrial enlargement.  Cardiac MRI October 2020 showed basal anteroseptal enhancement which could be seen with prior myocarditis but also could be seen with sarcoid. Pattern not consistent with amyloid.  Ejection fraction 61% and normal. Carotid Dopplers February 2023 showed 1 to 39% bilateral stenosis. Renal Dopplers August 2023 showed no renal artery stenosis.  Nuclear study May 2025 showed ejection fraction 72% and no ischemia or infarction.  Since I last saw him, patient denies dyspnea on exertion, orthopnea, PND, chest pain or syncope.  He is having some pain in his legs from a medication being used for skin cancer.  Current Outpatient Medications  Medication Sig Dispense Refill   AMBULATORY NON FORMULARY MEDICATION Collagen Peptides     aspirin  81 MG tablet Take 81 mg by mouth daily.     azelastine  (ASTELIN ) 0.1 % nasal spray Place 1 spray into both nostrils daily as needed for rhinitis. Use in each nostril as directed     Boswellia Serrata (BOSWELLIA PO) Take 500 mg by mouth 3 (three) times daily.     cholecalciferol (VITAMIN D) 25 MCG (1000 UNIT) tablet Take 3,000 Units by mouth 3 (three) times daily.     Cinnamon 500 MG capsule Take 1,000 mg by mouth 2 (two) times daily.     ciprofloxacin  (CILOXAN ) 0.3 % ophthalmic solution For ears     Coenzyme Q10 (COQ-10) 100 MG capsule Take 1 capsule (100 mg total) by mouth daily.     Cyanocobalamin (VITAMIN B-12) 1000 MCG SUBL Take 1,000 mcg by mouth daily.     dexamethasone  (DECADRON ) 0.1 % ophthalmic solution For ears     Diclofenac  Sodium 3 % GEL Qid prn 100 g 3   ezetimibe  (ZETIA ) 10 MG tablet TAKE 1 TABLET BY MOUTH  DAILY 90 tablet 3   Ginger, Zingiber officinalis, (GINGER ROOT) 550 MG CAPS Take 550 mg by mouth 2 (two) times daily.     hydrALAZINE  (APRESOLINE ) 100 MG tablet TAKE 1 TABLET BY MOUTH 3 TIMES A DAY 270 tablet 11   isosorbide  mononitrate (IMDUR ) 30 MG 24 hr tablet Take 1 tablet (30 mg total) by mouth daily. 90 tablet 3   levothyroxine  (SYNTHROID ) 112 MCG tablet Take 1 tablet (112 mcg total) by mouth daily before breakfast. 90 tablet 3   metFORMIN  (GLUCOPHAGE ) 500 MG tablet TAKE 1 TABLET BY MOUTH TWICE A DAY WITH A MEAL 180 tablet 1   Multiple Vitamins-Minerals (THERAGRAN-M ADVANCED 50 PLUS PO) Take 1 tablet by mouth daily.     Nerve Stimulator (CEFALY KIT) DEVI Apply 1 application topically daily at 12 noon. Electrical migraine kit for prevention on treatment of migraine headache     olmesartan  (BENICAR ) 40 MG tablet TAKE 1 TABLET BY MOUTH DAILY 90 tablet 2   pantoprazole  (PROTONIX ) 40 MG tablet TAKE 1 TABLET BY MOUTH DAILY 90 tablet 1   propranolol  ER (INDERAL  LA) 160 MG SR capsule Take 1 capsule (160 mg total) by mouth daily. 90 capsule 3   simvastatin  (ZOCOR ) 20 MG tablet TAKE 1 TABLET BY MOUTH AT BEDTIME 90 tablet 1   tamsulosin  (FLOMAX ) 0.4 MG CAPS capsule Take 0.4 mg by mouth daily. 30  capsule    topiramate  (TOPAMAX ) 50 MG tablet Take 1 tablet (50 mg total) by mouth at bedtime. 90 tablet 3   triamcinolone cream (KENALOG) 0.1 % Apply 1 Application topically as needed.     No current facility-administered medications for this visit.     Past Medical History:  Diagnosis Date   Abdominal bruit 07/22/2010   Acute sinusitis 03/26/2015   Acute suppr otitis media w spon rupt ear drum, recur, r ear 04/06/2017   Allergy    Angina pectoris 12/11/2008   Benign essential tremor 08/29/2017   Bilateral sensorineural hearing loss 11/30/2015   CAD S/P percutaneous coronary angioplasty 12/11/2008   LAD PCI '03,  Cath 2010 showed 40% LAD- medical Rx Myoview  March 2020 negative for ischemia but  positive for ST depression- Imdur  added.  Formatting of this note might be different from the original. LAD PCI '03,  Cath 2010 showed 40% LAD- medical Rx Myoview  March 2020 negative for ischemia but positive for ST depression- Imdur  added.  Last Assessment & Plan:  Check labs con't meds Followed by car   Cataract    Dr. 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 Echo May 2020- EF 60-65% with mild LVH  Last Assessment & Plan:  Well controlled, no changes to meds. Encouraged heart healthy diet such as the DASH diet and exercise as tolerated.   Eustachian tube dysfunction, bilateral 11/30/2015   History of colonic polyps 07/12/2010   History of GI bleed 04/23/2020   History of left mastoidectomy 02/20/2020   History of placement of ear tubes    Hyperlipidemia associated with type 2 diabetes mellitus 07/10/2007   Hyperthyroidism 07/12/2010   Hypothyroidism 07/10/2007   Internal hemorrhoids    Intractable episodic cluster headache 08/29/2017   Major depressive disorder 07/10/2007   Mastoiditis of both sides 09/21/2016   Migraine headache    Mild neurocognitive disorder due to multiple etiologies 12/25/2020   Non-insulin  dependent type 2 diabetes mellitus 07/06/2010   Osteoarthritis of multiple joints 07/10/2007   Perennial allergic rhinitis 09/22/2016   Postprocedural urinary retention    Presbycusis of both ears 01/23/2018   Skin cancer, basal cell    Thrombocytopenia    TIA (transient ischemic attack) 2006   per patient's report. He was never officially given this diagnosis   Upper  airway cough syndrome 09/25/2017    Past Surgical History:  Procedure Laterality Date   COLONOSCOPY  2011   coronary artery disease status post placement     of drug-eluting stent in the LAD in 2003,eEF 65% then   DEBRIDEMENT AND CLOSURE WOUND N/A 12/08/2020   Procedure: CLOSURE LEFT POST AURICULAR SCALP WOUND;  Surgeon: Arelia Filippo, MD;  Location: [facility];  Service: Plastics;  Laterality: N/A;   esophogeal dilation     fatty tissue removed     from neck 2003   HERNIA REPAIR  2007   Dr. Elon Pacini Patient # [phone]   mastoidectomy with tympanoplasty     MELANOMA EXCISION Left 11/17/2020   Procedure: WIDE LOCAL EXCISION, ADVANCED FLAP CLOSURE LEFT POSTERIOR EAR MELANOMA;  Surgeon:  Aron Shoulders, MD;  Location: [facility];  Service: General;  Laterality: Left;   MELANOMA EXCISION Left 12/01/2020   Procedure: RE-EXCISION LEFT POSTERIOR AURICULAR MELANOMA WITH TEMPORARY CLOSURE;  Surgeon: Aron Shoulders, MD;  Location: [facility];  Service: General;  Laterality: Left;  60 MIN TOTAL   POLYPECTOMY  2011   +TA   right toe bone spur surgery     SKIN FULL THICKNESS GRAFT Left 12/08/2020   Procedure: Full thickness skin graft from left upper arm;  Surgeon: Arelia Filippo, MD;  Location: [facility];  Service: Plastics;  Laterality: Left;   THYROIDECTOMY, PARTIAL  07/2002   right thyroid    TONSILLECTOMY     UPPER GI ENDOSCOPY      Social History   Socioeconomic History   Marital status: Married    Spouse name: Charleen   Number of children: 0   Years of education: 14   Highest education level: Associate degree: academic program  Occupational History   Occupation: retired    Associate Professor: RETIRED    Comment: accountant  Tobacco Use   Smoking status: Former    Current packs/day: 0.00    Types: Cigarettes    Start date: 10/10/1960    Quit date: 10/10/1965    Years since quitting: 58.5   Smokeless tobacco: Never  Vaping Use   Vaping  status: Never Used  Substance and Sexual Activity   Alcohol use: No   Drug use: No   Sexual activity: Yes    Partners: Female  Other Topics Concern   Not on file  Social History Narrative   Pt lives with spouse at private home in 2 story home- he has no children- right handed- he drinks coffee daily, tea sometimes, soda not all the time. Exercise-- treadmill, stationary bike   Right handed   Social Drivers of Health   Financial Resource Strain: Low Risk  (11/13/2023)   Overall Financial Resource Strain (CARDIA)    Difficulty of Paying Living Expenses: Not hard at all  Food Insecurity: Low Risk  (12/28/2023)   Received from [facility]   Hunger Vital Sign    Within the past 12 months, you worried that your food would run out before you got money to buy more: Never true    Within the past 12 months, the food you bought just didn't last and you didn't have money to get more. : Never true  Transportation Needs: No Transportation Needs (12/28/2023)   Received from [facility]    In the past 12 months, has lack of reliable transportation kept you from medical appointments, meetings, work or from getting things needed for daily living? : No  Physical Activity: Sufficiently Active (11/13/2023)   Exercise Vital Sign    Days of Exercise per Week: 7 days    Minutes of Exercise per Session: 40 min  Stress: No Stress Concern Present (11/13/2023)   [employer] of Occupational Health - Occupational Stress Questionnaire    Feeling of Stress : Not at all  Social Connections: Socially Integrated (11/13/2023)   Social Connection and Isolation Panel    Frequency of Communication with Friends and Family: More than three times a week    Frequency of Social Gatherings with Friends and Family: More than three times a week    Attends Religious Services: More than 4 times per year    Active Member of [employer] or Organizations: Yes    Attends Banker Meetings: 1 to 4  times per  year    Marital Status: Married  Catering manager Violence: Not At Risk (11/14/2023)   Humiliation, Afraid, Rape, and Kick questionnaire    Fear of Current or Ex-Partner: No    Emotionally Abused: No    Physically Abused: No    Sexually Abused: No    Family History  Problem Relation Age of Onset   Heart disease Mother    Alzheimer's disease Mother    Arthritis Mother    Heart disease Father        triple by pass   Hearing loss Father    Hypertension Father    Hypertension Brother        Prostate Cancer   Prostate cancer Brother    Alcohol abuse Brother    Hypertension Brother    Asthma Brother    Hypertension Brother    COPD Brother    Colon cancer Neg Hx    Colon polyps Neg Hx    Rectal cancer Neg Hx    Stomach cancer Neg Hx    Esophageal cancer Neg Hx     ROS: no fevers or chills, productive cough, hemoptysis, dysphasia, odynophagia, melena, hematochezia, dysuria, hematuria, rash, seizure activity, orthopnea, PND, pedal edema, claudication. Remaining systems are negative.  Physical Exam: Well-developed well-nourished in no acute distress.  Skin is warm and dry.  HEENT is normal.  Neck is supple.  Chest is clear to auscultation with normal expansion.  Cardiovascular exam is regular rate and rhythm.  Abdominal exam nontender or distended. No masses palpated. Extremities show no edema. neuro grossly intact  A/P  1 coronary artery disease-patient denies recurrent chest pain.  Recent nuclear study showed no ischemia.  Plan to continue medical therapy.  Continue aspirin  and statin.  2 hyperlipidemia-continue Zocor  and Zetia .  LDL February 2000 2534 and liver functions normal.  3 hypertension-patient's blood pressure is borderline.  I have asked him to track this at home and we will advance medications as needed.  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  This has resolved.  Redell Shallow, MD

## 2024-04-23 NOTE — Progress Notes (Unsigned)
 NEUROLOGY FOLLOW UP OFFICE NOTE  [name] [id number]  Assessment/Plan:   Migraine without aura,, without status migrainosus, not intractable Essential tremor Hypertension   Migraine prevention:  Propranolol  ER 160mg  daily, topiramate  50mg  at bedtime, Cefaly.  Essential tremor management:  propranolol  ER 160mg  daily, topiramate  50mg  at bedtime  Migraine rescue:  Will have him try samples of Ubrelvy 100mg .  Otherwise, Cefaly, Tylenol   Limit use of pain relievers to no more than 9 days out of the month to prevent risk of rebound or medication-overuse headache. Keep headache diary Follow up 12 months     Subjective:  [name] is a 78 year old right-handed male with osteoarthritis, hypertension, type 2 diabetes mellitus, hyperlipidemia and s/p mastoidectomy and tympanic membrane repair who follows up for migraines and essential tremor.  He is accompanied by his wife who supplements history.   UPDATE: Migraines: Only 4 migraines in last year.  He had 2 migraines last August, stress-related.  He had two migraines 1 day apart last month following an eye exam which triggered it.  Resolves after an hour with Cefaly but will have a postdrome headache for another 2 days.  No more than 1 a month.  Never tried the [place] Rescue protocol:  Tylenol , Cefaly Current NSAIDS:  piroxiicam daily (for arthritis) Current analgesics:  no Current triptans:  no Current ergotamine:  no Current anti-emetic:  no Current muscle relaxants:  no Current anti-anxiolytic:  no Current sleep aide:  no Current Antihypertensive medications:  propranolol  ER 160mg , olmesartan , hydralazine  Current Antidepressant medications:  no Current Anticonvulsant medications:  topiramate  50mg  at bedtime Current anti-CGRP:  no Current Vitamins/Herbal/Supplements: Boswellia Extract, Ginger root, coenzyme Q 10 Current Antihistamines/Decongestants: Flonase  Other therapy: Cephaly ( 30mg  once a day and 1 hour as needed).   Treat acutely with ginger tea and rest.   Caffeine: 2 to 3 cups of coffee daily Alcohol: No Smoker: No Diet: Hydrates.  No soda. Exercise: Yes Depression: Stable; Anxiety: Stable Other pain:  no Sleep hygiene: He wakes up every hour to urinate   Essential Tremor:  He is taking propranolol  ER 160 mg daily and topiramate  50mg  daily.  Overall stable.  Noticeable with activity.  Has to weld two-handed.         HISTORY: Migraine:  Onset: In his 97s or 30s.  They would occur once or twice a year.  In September, they started to occur once or twice a week and then almost daily.  Over the past couple of weeks (following tympanostomy tube placement), they have occurred about once a week. Location:  Top of head Quality:  explode Initial Intensity:  10/10 Aura:  Flashing lights/colors in vision Prodrome:  no Postdrome:  Hangover effect for up to 5 days Associated symptoms: Nausea, photophobia, phonophobia.  Denies unilateral numbness and weakness. Initial Duration:  Several hours Initial Frequency:   In September, they started to occur once or twice a week and then almost daily.  Over the past couple of weeks (following tympanostomy tube placement), they have occurred about once a week. Triggers: Emotional stress, sound, NTG, light, wine, eye strain Relieving factors: Sleep Activity:  Needs to lay down   Past NSAIDS:  Celebrex  Past analgesics:  no Past abortive triptans:  Sumatriptan  (stopped due to CAD). Past muscle relaxants:  no Past anti-emetic:  no Past antihypertensive medications:  none Past antidepressant medications:  Prozac, amitriptyline Past anticonvulsant medications:  no Past vitamins/Herbal/Supplements:  no Other past therapies:  Biofeedback, relaxation therapy   MRI of  brain without contrast from 08/11/16 showed mild chronic small vessel ischemic changes but no acute stroke, bleed or mass lesion.  However, it did reveal mild mastoiditis.  He saw ENT and had tympanostomy  tube placement on 09/22/16.  Due to worsening headaches, he had an MRI brain with and without contrast was performed on 01/02/2020 which showed no acute intracranial abnormality. History of double vision.  Had prisms made for his glasses which helped.   Essential Tremor: For over 10 years, he has had a tremor, particularly in his right hand.  It has become more noticeable.  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  There is no known family history.  Memory Deficits: Neuropsychological evaluation on 12/25/2020 showed evidence of mild neurocognitive disorder, however it may be related to medication (topiramate ) or migraines but at this time not convincing for a neurodegenerative disease.  He notes that he sometimes may have word-finding issues which can be a problem during conversation.      PAST MEDICAL HISTORY: Past Medical History:  Diagnosis Date   Abdominal bruit 07/22/2010   Acute sinusitis 03/26/2015   Acute suppr otitis media w spon rupt ear drum, recur, r ear 04/06/2017   Allergy    Angina pectoris 12/11/2008   Benign essential tremor 08/29/2017   Bilateral sensorineural hearing loss 11/30/2015   CAD S/P percutaneous coronary angioplasty 12/11/2008   LAD PCI '03,  Cath 2010 showed 40% LAD- medical Rx Myoview  March 2020 negative for ischemia but positive for ST depression- Imdur  added.  Formatting of this note might be different from the original. LAD PCI '03,  Cath 2010 showed 40% LAD- medical Rx Myoview  March 2020 negative for ischemia but positive for ST depression- Imdur  added.  Last Assessment & Plan:  Check labs con't meds Followed by car   Cataract    Dr. 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 Echo May 2020- EF 60-65% with mild LVH  Last Assessment & Plan:  Well controlled, no changes to meds. Encouraged heart healthy diet such as the DASH diet and exercise as tolerated.   Eustachian tube dysfunction, bilateral 11/30/2015   History of colonic polyps 07/12/2010   History of GI bleed 04/23/2020   History of left mastoidectomy 02/20/2020   History of placement of ear tubes    Hyperlipidemia associated with type 2 diabetes mellitus 07/10/2007   Hyperthyroidism 07/12/2010   Hypothyroidism 07/10/2007   Internal hemorrhoids    Intractable episodic cluster headache 08/29/2017   Major depressive disorder 07/10/2007   Mastoiditis of both sides 09/21/2016   Migraine headache    Mild neurocognitive disorder due to multiple etiologies 12/25/2020   Non-insulin  dependent type 2 diabetes mellitus 07/06/2010   Osteoarthritis of multiple joints 07/10/2007   Perennial allergic rhinitis 09/22/2016   Postprocedural urinary retention    Presbycusis of both ears 01/23/2018   Skin cancer, basal cell    Thrombocytopenia    TIA (transient ischemic attack) 2006   per patient's report. He was never officially given this diagnosis   Upper airway cough syndrome 09/25/2017  MEDICATIONS: Current Outpatient Medications on File Prior to Visit  Medication Sig Dispense Refill   AMBULATORY NON FORMULARY MEDICATION Collagen Peptides     aspirin  81 MG tablet Take 81 mg by mouth daily.     azelastine  (ASTELIN ) 0.1 % nasal spray Place 1 spray into both nostrils daily as needed for rhinitis. Use in each nostril as directed     Biotin 1000 MCG tablet Take 1,000 mcg by mouth in the morning, at noon, and at bedtime. (Patient not taking: Reported on 01/30/2024)     Boswellia Serrata (BOSWELLIA PO) Take 500 mg by mouth 3 (three) times daily.     cholecalciferol  (VITAMIN D) 25 MCG (1000 UNIT) tablet Take 3,000 Units by mouth 3 (three) times daily.     Cinnamon 500 MG capsule Take 1,000 mg by mouth 2 (two) times daily.     ciprofloxacin  (CILOXAN ) 0.3 % ophthalmic solution For ears     Coenzyme Q10 (COQ-10) 100 MG capsule Take 1 capsule (100 mg total) by mouth daily.     Cyanocobalamin (VITAMIN B-12) 1000 MCG SUBL Take 1,000 mcg by mouth daily.     dexamethasone  (DECADRON ) 0.1 % ophthalmic solution For ears     Diclofenac  Sodium 3 % GEL Qid prn (Patient taking differently: Apply 1 application  topically daily as needed (Arthritis).) 100 g 3   ezetimibe  (ZETIA ) 10 MG tablet TAKE 1 TABLET BY MOUTH DAILY 90 tablet 3   Ginger, Zingiber officinalis, (GINGER ROOT) 550 MG CAPS Take 550 mg by mouth 2 (two) times daily.     hydrALAZINE  (APRESOLINE ) 100 MG tablet TAKE 1 TABLET BY MOUTH 3 TIMES A DAY 270 tablet 11   isosorbide  mononitrate (IMDUR ) 30 MG 24 hr tablet Take 1 tablet (30 mg total) by mouth daily. 90 tablet 3   levothyroxine  (SYNTHROID ) 112 MCG tablet Take 1 tablet (112 mcg total) by mouth daily before breakfast. 90 tablet 3   metFORMIN  (GLUCOPHAGE ) 500 MG tablet TAKE 1 TABLET BY MOUTH TWICE A DAY WITH A MEAL 180 tablet 1   Multiple Vitamins-Minerals (THERAGRAN-M ADVANCED 50 PLUS PO) Take 1 tablet by mouth daily.     Nerve Stimulator (CEFALY KIT) DEVI Apply 1 application topically daily at 12 noon. Electrical migraine kit for prevention on treatment of migraine headache     olmesartan  (BENICAR ) 40 MG tablet TAKE 1 TABLET BY MOUTH DAILY 90 tablet 2   pantoprazole  (PROTONIX ) 40 MG tablet TAKE 1 TABLET BY MOUTH DAILY 90 tablet 1   propranolol  ER (INDERAL  LA) 160 MG SR capsule Take 1 capsule (160 mg total) by mouth daily. 90 capsule 3   simvastatin  (ZOCOR ) 20 MG tablet TAKE 1 TABLET BY MOUTH AT BEDTIME 90 tablet 1   tamsulosin  (FLOMAX ) 0.4 MG CAPS capsule Take 0.4 mg by mouth daily. 30 capsule    topiramate  (TOPAMAX ) 50 MG tablet TAKE 1 TABLET BY MOUTH EVERY  NIGHT AT BEDTIME 90 tablet 0   triamcinolone cream (KENALOG) 0.1 % Apply 1 Application topically as needed.     No current facility-administered medications on file prior to visit.    ALLERGIES: Allergies  Allergen Reactions   Crestor [Rosuvastatin Calcium ] Other (See Comments)    Severe joint and muscle pain   Nitrofurantoin  Swelling    Ankles/legs SOB   Rosuvastatin Other (See Comments)    REACTION: REALLY BAD JOINT/MUSCLE PAIN   Sulfa Antibiotics Other (See Comments)    Severe crippling  joint and muscle pain and redness   Sulfamethoxazole-Trimethoprim Other (See  Comments)    Joint and muscle pain   Sulfasalazine Other (See Comments)    Joint and muscle pain   Amlodipine  Swelling   Apple Other (See Comments)    Apples-migraines    Apples and tomatoes...... Nausea and migraines.   Codeine Other (See Comments)    Unknown   Hydrocodone-Acetaminophen  Other (See Comments)    To strong vicodin    Other     Apples and tomatoes...... Nausea and migraines.      FAMILY HISTORY: Family History  Problem Relation Age of Onset   Heart disease Mother    Alzheimer's disease Mother    Arthritis Mother    Heart disease Father        triple by pass   Hearing loss Father    Hypertension Father    Hypertension Brother        Prostate Cancer   Prostate cancer Brother    Alcohol abuse Brother    Hypertension Brother    Asthma Brother    Hypertension Brother    COPD Brother    Colon cancer Neg Hx    Colon polyps Neg Hx    Rectal cancer Neg Hx    Stomach cancer Neg Hx    Esophageal cancer Neg Hx       Objective:  Blood pressure (!) 143/75, pulse 68, weight 157 lb (71.2 kg), SpO2 95%. General: No acute distress.  Patient appears well-groomed.   Head:  Normocephalic/atraumatic Neck:  Supple.  No paraspinal tenderness.  Full range of motion. Heart:  Regular rate and rhythm. Neuro:  Alert and oriented.  Speech fluent and not dysarthric.  Language intact.  CN II-XII intact.   Bulk and tone normal.  Muscle strength 5/5 throughout.  Tremor not appreciated.  Able to write and draw Archimedes swirl.  Sensation to light touch intact.  Deep tendon reflexes 2+ throughout, toes downgoing.  Gait normal.  Romberg negative.   Juliene Dunnings, DO  CC: Jamee Antonio Meth, DO

## 2024-04-24 ENCOUNTER — Encounter: Payer: Self-pay | Admitting: Neurology

## 2024-04-24 ENCOUNTER — Ambulatory Visit (INDEPENDENT_AMBULATORY_CARE_PROVIDER_SITE_OTHER): Payer: Medicare Other | Admitting: Neurology

## 2024-04-24 VITALS — BP 143/75 | HR 68 | Wt 157.0 lb

## 2024-04-24 DIAGNOSIS — G43109 Migraine with aura, not intractable, without status migrainosus: Secondary | ICD-10-CM

## 2024-04-24 DIAGNOSIS — G250 Essential tremor: Secondary | ICD-10-CM | POA: Diagnosis not present

## 2024-04-24 MED ORDER — TOPIRAMATE 50 MG PO TABS: 50.0000 mg | ORAL_TABLET | Freq: Every day | ORAL | 3 refills | Status: AC

## 2024-04-24 MED ORDER — PROPRANOLOL HCL ER 160 MG PO CP24: 160.0000 mg | ORAL_CAPSULE | Freq: Every day | ORAL | 3 refills | Status: AC

## 2024-04-24 NOTE — Progress Notes (Signed)
 Medication Samples have been provided to the patient.  Drug name: holland       Strength: 100 mg        Qty: 4  LOT: [id number]  Exp.Date: 12/26  Dosing instructions: as needed  The patient has been instructed regarding the correct time, dose, and frequency of taking this medication, including desired effects and most common side effects.   [name] 2:47 PM 04/24/2024

## 2024-04-24 NOTE — Patient Instructions (Signed)
 Continue topiramate , propranolol  and Cefaly At earliest onset of migraine, take Ubrelvy.  May repeat after 2 hours.  Maximum 2 tablets in 24 hours.  Let me know if it works. Follow up one year

## 2024-04-26 ENCOUNTER — Encounter: Payer: Self-pay | Admitting: Advanced Practice Midwife

## 2024-04-30 ENCOUNTER — Encounter: Payer: Self-pay | Admitting: Cardiology

## 2024-04-30 ENCOUNTER — Ambulatory Visit: Attending: Cardiology | Admitting: Cardiology

## 2024-04-30 VITALS — BP 144/72 | HR 66 | Ht 69.0 in | Wt 158.6 lb

## 2024-04-30 DIAGNOSIS — I2510 Atherosclerotic heart disease of native coronary artery without angina pectoris: Secondary | ICD-10-CM | POA: Insufficient documentation

## 2024-04-30 DIAGNOSIS — E785 Hyperlipidemia, unspecified: Secondary | ICD-10-CM | POA: Diagnosis not present

## 2024-04-30 DIAGNOSIS — I10 Essential (primary) hypertension: Secondary | ICD-10-CM | POA: Insufficient documentation

## 2024-04-30 NOTE — Patient Instructions (Signed)

## 2024-05-07 DIAGNOSIS — H5010 Unspecified exotropia: Secondary | ICD-10-CM | POA: Diagnosis not present

## 2024-05-13 DIAGNOSIS — L570 Actinic keratosis: Secondary | ICD-10-CM | POA: Diagnosis not present

## 2024-05-13 DIAGNOSIS — D0472 Carcinoma in situ of skin of left lower limb, including hip: Secondary | ICD-10-CM | POA: Diagnosis not present

## 2024-05-28 ENCOUNTER — Ambulatory Visit: Payer: Medicare Other | Admitting: Family Medicine

## 2024-05-28 ENCOUNTER — Encounter: Payer: Self-pay | Admitting: Family Medicine

## 2024-05-28 VITALS — BP 142/70 | HR 68 | Temp 98.0°F | Resp 18 | Ht 69.0 in | Wt 157.0 lb

## 2024-05-28 DIAGNOSIS — E1165 Type 2 diabetes mellitus with hyperglycemia: Secondary | ICD-10-CM

## 2024-05-28 DIAGNOSIS — E039 Hypothyroidism, unspecified: Secondary | ICD-10-CM | POA: Diagnosis not present

## 2024-05-28 DIAGNOSIS — E785 Hyperlipidemia, unspecified: Secondary | ICD-10-CM | POA: Diagnosis not present

## 2024-05-28 DIAGNOSIS — N1831 Chronic kidney disease, stage 3a: Secondary | ICD-10-CM

## 2024-05-28 DIAGNOSIS — D696 Thrombocytopenia, unspecified: Secondary | ICD-10-CM | POA: Diagnosis not present

## 2024-05-28 DIAGNOSIS — R351 Nocturia: Secondary | ICD-10-CM | POA: Diagnosis not present

## 2024-05-28 DIAGNOSIS — L570 Actinic keratosis: Secondary | ICD-10-CM | POA: Diagnosis not present

## 2024-05-28 DIAGNOSIS — I10 Essential (primary) hypertension: Secondary | ICD-10-CM

## 2024-05-28 DIAGNOSIS — N401 Enlarged prostate with lower urinary tract symptoms: Secondary | ICD-10-CM

## 2024-05-28 LAB — COMPREHENSIVE METABOLIC PANEL WITH GFR
ALT: 17 U/L (ref 0–53)
AST: 17 U/L (ref 0–37)
Albumin: 4.3 g/dL (ref 3.5–5.2)
Alkaline Phosphatase: 57 U/L (ref 39–117)
BUN: 27 mg/dL — ABNORMAL HIGH (ref 6–23)
CO2: 27 meq/L (ref 19–32)
Calcium: 9 mg/dL (ref 8.4–10.5)
Chloride: 106 meq/L (ref 96–112)
Creatinine, Ser: 1.51 mg/dL — ABNORMAL HIGH (ref 0.40–1.50)
GFR: 43.4 mL/min — ABNORMAL LOW (ref >60.00)
Glucose, Bld: 87 mg/dL (ref 70–99)
Potassium: 4.1 meq/L (ref 3.5–5.1)
Sodium: 140 meq/L (ref 135–145)
Total Bilirubin: 0.7 mg/dL (ref 0.2–1.2)
Total Protein: 7.1 g/dL (ref 6.0–8.3)

## 2024-05-28 LAB — CBC WITH DIFFERENTIAL/PLATELET
Basophils Absolute: 0 K/uL (ref 0.0–0.1)
Basophils Relative: 0.6 % (ref 0.0–3.0)
Eosinophils Absolute: 0.3 K/uL (ref 0.0–0.7)
Eosinophils Relative: 5 % (ref 0.0–5.0)
HCT: 47.4 % (ref 39.0–52.0)
Hemoglobin: 16 g/dL (ref 13.0–17.0)
Lymphocytes Relative: 12.3 % (ref 12.0–46.0)
Lymphs Abs: 0.6 K/uL — ABNORMAL LOW (ref 0.7–4.0)
MCHC: 33.8 g/dL (ref 30.0–36.0)
MCV: 95.9 fl (ref 78.0–100.0)
Monocytes Absolute: 0.7 K/uL (ref 0.1–1.0)
Monocytes Relative: 13 % — ABNORMAL HIGH (ref 3.0–12.0)
Neutro Abs: 3.6 K/uL (ref 1.4–7.7)
Neutrophils Relative %: 69.1 % (ref 43.0–77.0)
Platelets: 106 K/uL — ABNORMAL LOW (ref 150.0–400.0)
RBC: 4.94 Mil/uL (ref 4.22–5.81)
RDW: 13 % (ref 11.5–15.5)
WBC: 5.2 K/uL (ref 4.0–10.5)

## 2024-05-28 LAB — LIPID PANEL
Cholesterol: 97 mg/dL (ref 0–200)
HDL: 46.2 mg/dL (ref >39.00)
LDL Cholesterol: 38 mg/dL (ref 0–99)
NonHDL: 50.49
Total CHOL/HDL Ratio: 2
Triglycerides: 61 mg/dL (ref 0.0–149.0)
VLDL: 12.2 mg/dL (ref 0.0–40.0)

## 2024-05-28 LAB — MICROALBUMIN / CREATININE URINE RATIO
Creatinine,U: 196.2 mg/dL
Microalb Creat Ratio: 19.4 mg/g (ref 0.0–30.0)
Microalb, Ur: 3.8 mg/dL — ABNORMAL HIGH (ref 0.0–1.9)

## 2024-05-28 LAB — TSH: TSH: 0.93 u[IU]/mL (ref 0.35–5.50)

## 2024-05-28 LAB — HEMOGLOBIN A1C: Hgb A1c MFr Bld: 5.6 % (ref 4.6–6.5)

## 2024-05-28 MED ORDER — LEVOTHYROXINE SODIUM 112 MCG PO TABS: 112.0000 ug | ORAL_TABLET | Freq: Every day | ORAL | 3 refills | Status: DC

## 2024-05-28 NOTE — Patient Instructions (Signed)

## 2024-05-28 NOTE — Assessment & Plan Note (Signed)
 Per u rology

## 2024-05-28 NOTE — Progress Notes (Signed)
 Subjective:    Patient ID: [name], male    DOB: [birth year], 78 y.o.   MRN: [id number]  Chief Complaint  Patient presents with   Diabetes   Hypothyroidism   Hyperlipidemia    HPI Patient is in today for f/u dm, chol and htn.   Discussed the use of AI scribe software for clinical note transcription with the patient, who gave verbal consent to proceed.  History of Present Illness [name] is an 78 year old male who presents with soreness and itching from Tolak treatment on his legs.  He is undergoing his second treatment with Tolak, an external chemotherapy, for skin lesions on his legs. He experiences significant soreness and itching, which were severe enough during the first treatment to cause him to stop early. The soreness is particularly pronounced in the bottoms of his feet, making it difficult to walk, and it recurs when he sits for a period and then stands up. He applies the treatment to his lower legs, but the soreness in his feet is unexplained. He started the second treatment two weeks ago, and the soreness returned immediately.  His blood pressure has recently normalized to the 135-140 mmHg range after previously being in the 180 mmHg range. He notes that he feels better when his blood pressure is higher. He has a history of melanomas and has had several removed. Recent biopsies of skin lesions were negative, but they became infected, requiring antibiotics for about three weeks before improving. He is currently treating a mole with Tolak, which is drying up but was initially painful and enlarged.  He is not currently checking his blood sugar levels and sees his endocrinologist every six months. He has a blood test request from another provider and usually provides the results from his current provider's tests. No significant gastrointestinal symptoms aside from occasional constipation. He is unsure about the need for future  colonoscopies.  He is actively involved in restoring vehicles, which requires him to travel between locations.    Past Medical History:  Diagnosis Date   Abdominal bruit 07/22/2010   Acute sinusitis 03/26/2015   Acute suppr otitis media w spon rupt ear drum, recur, r ear 04/06/2017   Allergy    Angina pectoris 12/11/2008   Benign essential tremor 08/29/2017   Bilateral sensorineural hearing loss 11/30/2015   CAD S/P percutaneous coronary angioplasty 12/11/2008   LAD PCI '03,  Cath 2010 showed 40% LAD- medical Rx Myoview  March 2020 negative for ischemia but positive for ST depression- Imdur  added.  Formatting of this note might be different from the original. LAD PCI '03,  Cath 2010 showed 40% LAD- medical Rx Myoview  March 2020 negative for ischemia but positive for ST depression- Imdur  added.  Last Assessment & Plan:  Check labs con't meds Followed by car   Cataract    Dr. 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 Echo May 2020- EF 60-65% with mild LVH  Last Assessment & Plan:  Well controlled, no changes  to meds. Encouraged heart healthy diet such as the DASH diet and exercise as tolerated.   Eustachian tube dysfunction, bilateral 11/30/2015   History of colonic polyps 07/12/2010   History of GI bleed 04/23/2020   History of left mastoidectomy 02/20/2020   History of placement of ear tubes    Hyperlipidemia associated with type 2 diabetes mellitus 07/10/2007   Hyperthyroidism 07/12/2010   Hypothyroidism 07/10/2007   Internal hemorrhoids    Intractable episodic cluster headache  08/29/2017   Major depressive disorder 07/10/2007   Mastoiditis of both sides 09/21/2016   Migraine headache    Mild neurocognitive disorder due to multiple etiologies 12/25/2020   Non-insulin  dependent type 2 diabetes mellitus 07/06/2010   Osteoarthritis of multiple joints 07/10/2007   Perennial allergic rhinitis 09/22/2016   Postprocedural urinary retention    Presbycusis of both ears 01/23/2018   Skin cancer, basal cell    Thrombocytopenia    TIA (transient ischemic attack) 2006   per patient's report. He was never officially given this diagnosis   Upper airway cough syndrome 09/25/2017    Past Surgical History:  Procedure Laterality Date   COLONOSCOPY  2011   coronary artery disease status post placement     of drug-eluting stent in the LAD in 2003,eEF 65% then   DEBRIDEMENT AND CLOSURE WOUND N/A 12/08/2020   Procedure: CLOSURE LEFT POST AURICULAR SCALP WOUND;  Surgeon: Arelia Filippo, MD;  Location: [facility];  Service: Plastics;  Laterality: N/A;   esophogeal dilation     fatty tissue removed     from neck 2003   HERNIA REPAIR  2007   Dr. Elon Pacini Patient # [phone]   mastoidectomy with tympanoplasty     MELANOMA EXCISION Left 11/17/2020   Procedure: WIDE LOCAL EXCISION, ADVANCED FLAP CLOSURE LEFT POSTERIOR EAR MELANOMA;  Surgeon: Aron Shoulders, MD;  Location: [facility];  Service: General;  Laterality: Left;   MELANOMA EXCISION Left 12/01/2020   Procedure: RE-EXCISION LEFT POSTERIOR AURICULAR MELANOMA WITH TEMPORARY CLOSURE;  Surgeon: Aron Shoulders, MD;  Location: [facility];  Service: General;  Laterality: Left;  60 MIN TOTAL   POLYPECTOMY  2011   +TA   right toe bone spur surgery     SKIN FULL THICKNESS GRAFT Left 12/08/2020   Procedure: Full thickness skin graft from left upper arm;  Surgeon: Arelia Filippo, MD;  Location: [facility];  Service: Plastics;  Laterality: Left;   THYROIDECTOMY, PARTIAL  07/2002   right  thyroid    TONSILLECTOMY     UPPER GI ENDOSCOPY      Family History  Problem Relation Age of Onset   Heart disease Mother    Alzheimer's disease Mother    Arthritis Mother    Heart disease Father        triple by pass   Hearing loss Father    Hypertension Father    Hypertension Brother        Prostate Cancer   Prostate cancer Brother    Alcohol abuse Brother    Hypertension Brother    Asthma Brother    Hypertension Brother    COPD Brother    Colon cancer Neg Hx    Colon polyps Neg Hx    Rectal cancer Neg Hx    Stomach cancer Neg Hx    Esophageal cancer Neg Hx     Social History   Socioeconomic History   Marital status: Married    Spouse name: Charleen   Number of children:  0   Years of education: 14   Highest education level: Associate degree: occupational, Scientist, product/process development, or vocational program  Occupational History   Occupation: retired    Associate Professor: RETIRED    Comment: accountant  Tobacco Use   Smoking status: Former    Current packs/day: 0.00    Types: Cigarettes    Start date: 10/10/1960    Quit date: 10/10/1965    Years since quitting: 58.6   Smokeless tobacco: Never  Vaping Use   Vaping status: Never Used  Substance and Sexual Activity   Alcohol use: No   Drug use: No   Sexual activity: Yes    Partners: Female  Other Topics Concern   Not on file  Social History Narrative   Pt lives with spouse at private home in 2 story home- he has no children- right handed- he drinks coffee daily, tea sometimes, soda not all the time. Exercise-- treadmill, stationary bike   Right handed   Social Drivers of Health   Financial Resource Strain: Low Risk  (05/24/2024)   Overall Financial Resource Strain (CARDIA)    Difficulty of Paying Living Expenses: Not hard at all  Food Insecurity: No Food Insecurity (05/24/2024)   Hunger Vital Sign    Worried About Running Out of Food in the Last Year: Never true    Ran Out of Food in the Last Year: Never true  Transportation Needs:  No Transportation Needs (05/24/2024)   PRAPARE - Administrator, Civil Service (Medical): No    Lack of Transportation (Non-Medical): No  Physical Activity: Sufficiently Active (05/24/2024)   Exercise Vital Sign    Days of Exercise per Week: 7 days    Minutes of Exercise per Session: 30 min  Stress: No Stress Concern Present (05/24/2024)   [employer] of Occupational Health - Occupational Stress Questionnaire    Feeling of Stress: Not at all  Social Connections: Socially Integrated (05/24/2024)   Social Connection and Isolation Panel    Frequency of Communication with Friends and Family: More than three times a week    Frequency of Social Gatherings with Friends and Family: More than three times a week    Attends Religious Services: More than 4 times per year    Active Member of [employer] or Organizations: Yes    Attends Engineer, structural: More than 4 times per year    Marital Status: Married  Catering manager Violence: Not At Risk (11/14/2023)   Humiliation, Afraid, Rape, and Kick questionnaire    Fear of Current or Ex-Partner: No    Emotionally Abused: No    Physically Abused: No    Sexually Abused: No    Outpatient Medications Prior to Visit  Medication Sig Dispense Refill   AMBULATORY NON FORMULARY MEDICATION Collagen Peptides     aspirin  81 MG tablet Take 81 mg by mouth daily.     azelastine  (ASTELIN ) 0.1 % nasal spray Place 1 spray into both nostrils daily as needed for rhinitis. Use in each nostril as directed     Boswellia Serrata (BOSWELLIA PO) Take 500 mg by mouth 3 (three) times daily.     cholecalciferol (VITAMIN D) 25 MCG (1000 UNIT) tablet Take 3,000 Units by mouth 3 (three) times daily.     Cinnamon 500 MG capsule Take 1,000 mg by mouth 2 (two) times daily.     ciprofloxacin  (CILOXAN ) 0.3 % ophthalmic solution For ears     Coenzyme Q10 (COQ-10) 100 MG capsule Take 1 capsule (100 mg  total) by mouth daily.     Cyanocobalamin (VITAMIN B-12) 1000 MCG  SUBL Take 1,000 mcg by mouth daily.     dexamethasone  (DECADRON ) 0.1 % ophthalmic solution For ears     Diclofenac  Sodium 3 % GEL Qid prn 100 g 3   ezetimibe  (ZETIA ) 10 MG tablet TAKE 1 TABLET BY MOUTH DAILY 90 tablet 3   Fluorouracil (TOLAK) 4 % CREA Apply topically.     Ginger, Zingiber officinalis, (GINGER ROOT) 550 MG CAPS Take 550 mg by mouth 2 (two) times daily.     hydrALAZINE  (APRESOLINE ) 100 MG tablet TAKE 1 TABLET BY MOUTH 3 TIMES A DAY 270 tablet 11   isosorbide  mononitrate (IMDUR ) 30 MG 24 hr tablet Take 1 tablet (30 mg total) by mouth daily. 90 tablet 3   metFORMIN  (GLUCOPHAGE ) 500 MG tablet TAKE 1 TABLET BY MOUTH TWICE A DAY WITH A MEAL 180 tablet 1   Multiple Vitamins-Minerals (THERAGRAN-M ADVANCED 50 PLUS PO) Take 1 tablet by mouth daily.     Nerve Stimulator (CEFALY KIT) DEVI Apply 1 application topically daily at 12 noon. Electrical migraine kit for prevention on treatment of migraine headache     olmesartan  (BENICAR ) 40 MG tablet TAKE 1 TABLET BY MOUTH DAILY 90 tablet 2   pantoprazole  (PROTONIX ) 40 MG tablet TAKE 1 TABLET BY MOUTH DAILY 90 tablet 1   propranolol  ER (INDERAL  LA) 160 MG SR capsule Take 1 capsule (160 mg total) by mouth daily. 90 capsule 3   simvastatin  (ZOCOR ) 20 MG tablet TAKE 1 TABLET BY MOUTH AT BEDTIME 90 tablet 1   tamsulosin  (FLOMAX ) 0.4 MG CAPS capsule Take 0.4 mg by mouth daily. 30 capsule    topiramate  (TOPAMAX ) 50 MG tablet Take 1 tablet (50 mg total) by mouth at bedtime. 90 tablet 3   triamcinolone cream (KENALOG) 0.1 % Apply 1 Application topically as needed.     levothyroxine  (SYNTHROID ) 112 MCG tablet Take 1 tablet (112 mcg total) by mouth daily before breakfast. 90 tablet 3   No facility-administered medications prior to visit.    Allergies  Allergen Reactions   Crestor [Rosuvastatin Calcium ] Other (See Comments)    Severe joint and muscle pain   Nitrofurantoin  Swelling    Ankles/legs SOB   Rosuvastatin Other (See Comments)    REACTION:  REALLY BAD JOINT/MUSCLE PAIN   Sulfa Antibiotics Other (See Comments)    Severe crippling  joint and muscle pain and redness   Sulfamethoxazole-Trimethoprim Other (See Comments)    Joint and muscle pain   Sulfasalazine Other (See Comments)    Joint and muscle pain   Amlodipine  Swelling   Apple Other (See Comments)    Apples-migraines    Apples and tomatoes...... Nausea and migraines.   Codeine Other (See Comments)    Unknown   Hydrocodone-Acetaminophen  Other (See Comments)    To strong vicodin    Other     Apples and tomatoes...... Nausea and migraines.      Review of Systems  Constitutional:  Negative for fever and malaise/fatigue.  HENT:  Negative for congestion.   Eyes:  Negative for blurred vision.  Respiratory:  Negative for cough and shortness of breath.   Cardiovascular:  Negative for chest pain, palpitations and leg swelling.  Gastrointestinal:  Negative for vomiting.  Musculoskeletal:  Negative for back pain.  Skin:  Negative for rash.  Neurological:  Negative for loss of consciousness and headaches.       Objective:    Physical Exam Vitals and nursing  note reviewed.  Constitutional:      General: He is not in acute distress.    Appearance: Normal appearance. He is well-developed.  HENT:     Head: Normocephalic and atraumatic.  Eyes:     General: No scleral icterus.       Right eye: No discharge.        Left eye: No discharge.  Cardiovascular:     Rate and Rhythm: Normal rate and regular rhythm.     Heart sounds: No murmur heard. Pulmonary:     Effort: Pulmonary effort is normal. No respiratory distress.     Breath sounds: Normal breath sounds.  Musculoskeletal:        General: Normal range of motion.     Cervical back: Normal range of motion and neck supple.     Right lower leg: No edema.     Left lower leg: No edema.  Skin:    General: Skin is warm and dry.     Findings: Erythema and rash present.  Neurological:     Mental Status: He is alert  and oriented to person, place, and time.  Psychiatric:        Mood and Affect: Mood normal.        Behavior: Behavior normal.        Thought Content: Thought content normal.        Judgment: Judgment normal.     BP (!) 142/70 (BP Location: Left Arm, Patient Position: Sitting, Cuff Size: Normal)   Pulse 68   Temp 98 F (36.7 C) (Oral)   Resp 18   Ht 5' 9 (1.753 m)   Wt 157 lb (71.2 kg)   SpO2 96%   BMI 23.18 kg/m  Wt Readings from Last 3 Encounters:  05/28/24 157 lb (71.2 kg)  04/30/24 158 lb 9.6 oz (71.9 kg)  04/24/24 157 lb (71.2 kg)    Diabetic Foot Exam - Simple   No data filed    Lab Results  Component Value Date   WBC 5.8 11/24/2023   HGB 15.9 11/24/2023   HCT 46.4 11/24/2023   PLT 124.0 (L) 11/24/2023   GLUCOSE 106 (H) 11/24/2023   CHOL 80 11/24/2023   TRIG 40.0 11/24/2023   HDL 38.20 (L) 11/24/2023   LDLCALC 34 11/24/2023   ALT 16 11/24/2023   AST 15 11/24/2023   NA 142 11/24/2023   K 4.2 11/24/2023   CL 108 11/24/2023   CREATININE 1.55 (H) 11/24/2023   BUN 32 (H) 11/24/2023   CO2 25 11/24/2023   TSH 2.19 11/24/2023   PSA 4.64 (H) 05/24/2023   INR 1.0 07/01/2019   HGBA1C 5.2 11/24/2023   MICROALBUR 4.7 (H) 11/24/2023    Lab Results  Component Value Date   TSH 2.19 11/24/2023   Lab Results  Component Value Date   WBC 5.8 11/24/2023   HGB 15.9 11/24/2023   HCT 46.4 11/24/2023   MCV 95.4 11/24/2023   PLT 124.0 (L) 11/24/2023   Lab Results  Component Value Date   NA 142 11/24/2023   K 4.2 11/24/2023   CHLORIDE 107 01/05/2017   CO2 25 11/24/2023   GLUCOSE 106 (H) 11/24/2023   BUN 32 (H) 11/24/2023   CREATININE 1.55 (H) 11/24/2023   BILITOT 0.7 11/24/2023   ALKPHOS 56 11/24/2023   AST 15 11/24/2023   ALT 16 11/24/2023   PROT 7.0 11/24/2023   ALBUMIN 4.0 11/24/2023   CALCIUM  8.8 11/24/2023   ANIONGAP 9 11/19/2021   EGFR  43.0 03/24/2023   GFR 42.21 (L) 11/24/2023   Lab Results  Component Value Date   CHOL 80 11/24/2023   Lab  Results  Component Value Date   HDL 38.20 (L) 11/24/2023   Lab Results  Component Value Date   LDLCALC 34 11/24/2023   Lab Results  Component Value Date   TRIG 40.0 11/24/2023   Lab Results  Component Value Date   CHOLHDL 2 11/24/2023   Lab Results  Component Value Date   HGBA1C 5.2 11/24/2023       Assessment & Plan:  Type 2 diabetes mellitus with hyperglycemia, without long-term current use of insulin  (HCC) -     Comprehensive metabolic panel with GFR -     Hemoglobin A1c -     Microalbumin / creatinine urine ratio  Hypothyroidism, unspecified type -     Levothyroxine  Sodium; Take 1 tablet (112 mcg total) by mouth daily before breakfast.  Dispense: 90 tablet; Refill: 3 -     TSH  Hyperlipidemia LDL goal <70 -     Lipid panel -     Comprehensive metabolic panel with GFR  Primary hypertension -     Lipid panel -     CBC with Differential/Platelet -     Comprehensive metabolic panel with GFR  Stage 3a chronic kidney disease (HCC) -     Comprehensive metabolic panel with GFR  Thrombocytopenia (HCC) -     CBC with Differential/Platelet  BPH associated with nocturia Assessment & Plan: Per urology   Assessment and Plan Assessment & Plan Actinic keratosis of lower legs undergoing topical chemotherapy   Actinic keratosis on the lower legs is being treated with Tolak. The second treatment is ongoing due to an insufficient response to the first. He experiences significant side effects, including cramps, soreness, and itching, particularly in the feet, which may be coincidental. A cardiologist assessed blood flow as normal. Blood pressure has improved to 135-140 mmHg from a previous range of 180 mmHg. Biopsies were negative, but melanomas necessitate continued treatment. Continue Tolak for two more weeks, applying daily before bedtime. Recommend taking Benadryl at night to help with itching and sleep.    Shandie Bertz R Lowne Chase, DO

## 2024-05-31 DIAGNOSIS — L249 Irritant contact dermatitis, unspecified cause: Secondary | ICD-10-CM | POA: Diagnosis not present

## 2024-06-04 DIAGNOSIS — D631 Anemia in chronic kidney disease: Secondary | ICD-10-CM | POA: Diagnosis not present

## 2024-06-04 DIAGNOSIS — N2581 Secondary hyperparathyroidism of renal origin: Secondary | ICD-10-CM | POA: Diagnosis not present

## 2024-06-04 DIAGNOSIS — I129 Hypertensive chronic kidney disease with stage 1 through stage 4 chronic kidney disease, or unspecified chronic kidney disease: Secondary | ICD-10-CM | POA: Diagnosis not present

## 2024-06-04 DIAGNOSIS — N1832 Chronic kidney disease, stage 3b: Secondary | ICD-10-CM | POA: Diagnosis not present

## 2024-06-09 ENCOUNTER — Ambulatory Visit: Payer: Self-pay | Admitting: Family Medicine

## 2024-06-25 ENCOUNTER — Other Ambulatory Visit: Payer: Self-pay

## 2024-06-25 ENCOUNTER — Other Ambulatory Visit: Payer: Self-pay | Admitting: Family Medicine

## 2024-06-25 MED ORDER — OLMESARTAN MEDOXOMIL 40 MG PO TABS: 40.0000 mg | ORAL_TABLET | Freq: Every day | ORAL | 3 refills | Status: AC

## 2024-07-04 DIAGNOSIS — H90A21 Sensorineural hearing loss, unilateral, right ear, with restricted hearing on the contralateral side: Secondary | ICD-10-CM | POA: Diagnosis not present

## 2024-07-04 DIAGNOSIS — H90A32 Mixed conductive and sensorineural hearing loss, unilateral, left ear with restricted hearing on the contralateral side: Secondary | ICD-10-CM | POA: Diagnosis not present

## 2024-07-04 DIAGNOSIS — H6192 Disorder of left external ear, unspecified: Secondary | ICD-10-CM | POA: Diagnosis not present

## 2024-07-04 DIAGNOSIS — H60393 Other infective otitis externa, bilateral: Secondary | ICD-10-CM | POA: Diagnosis not present

## 2024-07-04 DIAGNOSIS — H6993 Unspecified Eustachian tube disorder, bilateral: Secondary | ICD-10-CM | POA: Diagnosis not present

## 2024-07-04 DIAGNOSIS — E119 Type 2 diabetes mellitus without complications: Secondary | ICD-10-CM | POA: Diagnosis not present

## 2024-07-20 ENCOUNTER — Encounter: Payer: Self-pay | Admitting: Family Medicine

## 2024-07-20 DIAGNOSIS — E039 Hypothyroidism, unspecified: Secondary | ICD-10-CM

## 2024-07-23 MED ORDER — LEVOTHYROXINE SODIUM 112 MCG PO TABS: 112.0000 ug | ORAL_TABLET | Freq: Every day | ORAL | 3 refills | Status: AC

## 2024-07-24 DIAGNOSIS — Z23 Encounter for immunization: Secondary | ICD-10-CM | POA: Diagnosis not present

## 2024-08-15 DIAGNOSIS — H90A21 Sensorineural hearing loss, unilateral, right ear, with restricted hearing on the contralateral side: Secondary | ICD-10-CM | POA: Diagnosis not present

## 2024-08-15 DIAGNOSIS — Z4589 Encounter for adjustment and management of other implanted devices: Secondary | ICD-10-CM | POA: Diagnosis not present

## 2024-08-15 DIAGNOSIS — H6192 Disorder of left external ear, unspecified: Secondary | ICD-10-CM | POA: Diagnosis not present

## 2024-08-15 DIAGNOSIS — H6993 Unspecified Eustachian tube disorder, bilateral: Secondary | ICD-10-CM | POA: Diagnosis not present

## 2024-08-15 DIAGNOSIS — H9113 Presbycusis, bilateral: Secondary | ICD-10-CM | POA: Diagnosis not present

## 2024-08-15 DIAGNOSIS — Z9889 Other specified postprocedural states: Secondary | ICD-10-CM | POA: Diagnosis not present

## 2024-08-15 DIAGNOSIS — Z9089 Acquired absence of other organs: Secondary | ICD-10-CM | POA: Diagnosis not present

## 2024-08-15 DIAGNOSIS — H60393 Other infective otitis externa, bilateral: Secondary | ICD-10-CM | POA: Diagnosis not present

## 2024-08-15 DIAGNOSIS — H90A32 Mixed conductive and sensorineural hearing loss, unilateral, left ear with restricted hearing on the contralateral side: Secondary | ICD-10-CM | POA: Diagnosis not present

## 2024-08-21 ENCOUNTER — Other Ambulatory Visit: Payer: Self-pay | Admitting: Family Medicine

## 2024-08-21 DIAGNOSIS — E1165 Type 2 diabetes mellitus with hyperglycemia: Secondary | ICD-10-CM

## 2024-09-26 DIAGNOSIS — L308 Other specified dermatitis: Secondary | ICD-10-CM | POA: Diagnosis not present

## 2024-09-26 DIAGNOSIS — Z08 Encounter for follow-up examination after completed treatment for malignant neoplasm: Secondary | ICD-10-CM | POA: Diagnosis not present

## 2024-09-26 DIAGNOSIS — L821 Other seborrheic keratosis: Secondary | ICD-10-CM | POA: Diagnosis not present

## 2024-09-26 DIAGNOSIS — L814 Other melanin hyperpigmentation: Secondary | ICD-10-CM | POA: Diagnosis not present

## 2024-09-26 DIAGNOSIS — L570 Actinic keratosis: Secondary | ICD-10-CM | POA: Diagnosis not present

## 2024-09-26 DIAGNOSIS — Z86007 Personal history of in-situ neoplasm of skin: Secondary | ICD-10-CM | POA: Diagnosis not present

## 2024-09-26 DIAGNOSIS — D225 Melanocytic nevi of trunk: Secondary | ICD-10-CM | POA: Diagnosis not present

## 2024-10-01 NOTE — Progress Notes (Signed)
 "    HPI: FU CAD with prior PCI of his LAD in May 2003. Cardiac cath 2010 showed 30-40% LAD just distal to the stent. There was no other obstructive disease noted. The ejection fraction is 50%. He has been treated medically. Abdominal ultrasound in Oct 2011 showed no aneurysm. Echocardiogram May 2020 showed normal LV function, mild left atrial enlargement.  Cardiac MRI October 2020 showed basal anteroseptal enhancement which could be seen with prior myocarditis but also could be seen with sarcoid. Pattern not consistent with amyloid.  Ejection fraction 61% and normal. Carotid Dopplers February 2023 showed 1 to 39% bilateral stenosis. Renal Dopplers August 2023 showed no renal artery stenosis.  Nuclear study May 2025 showed ejection fraction 72% and no ischemia or infarction.  Since I last saw him, there is no dyspnea, chest pain, palpitations or syncope.  Current Outpatient Medications  Medication Sig Dispense Refill   AMBULATORY NON FORMULARY MEDICATION Collagen Peptides     aspirin  81 MG tablet Take 81 mg by mouth daily.     azelastine  (ASTELIN ) 0.1 % nasal spray Place 1 spray into both nostrils daily as needed for rhinitis. Use in each nostril as directed     Boswellia Serrata (BOSWELLIA PO) Take 500 mg by mouth 3 (three) times daily.     cholecalciferol (VITAMIN D) 25 MCG (1000 UNIT) tablet Take 3,000 Units by mouth 3 (three) times daily.     Cinnamon 500 MG capsule Take 1,000 mg by mouth 2 (two) times daily.     Coenzyme Q10 (COQ-10) 100 MG capsule Take 1 capsule (100 mg total) by mouth daily.     Cyanocobalamin (VITAMIN B-12) 1000 MCG SUBL Take 1,000 mcg by mouth daily.     Diclofenac  Sodium 3 % GEL Qid prn 100 g 3   ezetimibe  (ZETIA ) 10 MG tablet TAKE 1 TABLET BY MOUTH DAILY 90 tablet 3   Ginger, Zingiber officinalis, (GINGER ROOT) 550 MG CAPS Take 550 mg by mouth 2 (two) times daily.     hydrALAZINE  (APRESOLINE ) 100 MG tablet TAKE 1 TABLET BY MOUTH 3 TIMES A DAY 270 tablet 11   isosorbide   mononitrate (IMDUR ) 30 MG 24 hr tablet Take 1 tablet (30 mg total) by mouth daily. 90 tablet 3   levothyroxine  (SYNTHROID ) 112 MCG tablet Take 1 tablet (112 mcg total) by mouth daily before breakfast. 90 tablet 3   metFORMIN  (GLUCOPHAGE ) 500 MG tablet Take 1 tablet (500 mg total) by mouth 2 (two) times daily with a meal. 180 tablet 1   Multiple Vitamins-Minerals (THERAGRAN-M ADVANCED 50 PLUS PO) Take 1 tablet by mouth daily.     Nerve Stimulator (CEFALY KIT) DEVI Apply 1 application topically daily at 12 noon. Electrical migraine kit for prevention on treatment of migraine headache     olmesartan  (BENICAR ) 40 MG tablet Take 1 tablet (40 mg total) by mouth daily. 90 tablet 3   pantoprazole  (PROTONIX ) 40 MG tablet Take 1 tablet (40 mg total) by mouth daily. 90 tablet 1   propranolol  ER (INDERAL  LA) 160 MG SR capsule Take 1 capsule (160 mg total) by mouth daily. 90 capsule 3   simvastatin  (ZOCOR ) 20 MG tablet TAKE 1 TABLET BY MOUTH AT BEDTIME 90 tablet 1   tamsulosin  (FLOMAX ) 0.4 MG CAPS capsule Take 0.4 mg by mouth daily. 30 capsule    topiramate  (TOPAMAX ) 50 MG tablet Take 1 tablet (50 mg total) by mouth at bedtime. 90 tablet 3   triamcinolone cream (KENALOG) 0.1 % Apply 1 Application  topically as needed.     ciprofloxacin  (CILOXAN ) 0.3 % ophthalmic solution For ears     dexamethasone  (DECADRON ) 0.1 % ophthalmic solution For ears     Fluorouracil (TOLAK) 4 % CREA Apply topically.     No current facility-administered medications for this visit.     Past Medical History:  Diagnosis Date   Abdominal bruit 07/22/2010   Acute sinusitis 03/26/2015   Acute suppr otitis media w spon rupt ear drum, recur, r ear 04/06/2017   Allergy    Angina pectoris 12/11/2008   Benign essential tremor 08/29/2017   Bilateral sensorineural hearing loss 11/30/2015   CAD S/P percutaneous coronary angioplasty 12/11/2008   LAD PCI '03,  Cath 2010 showed 40% LAD- medical Rx Myoview  March 2020 negative for ischemia but  positive for ST depression- Imdur  added.  Formatting of this note might be different from the original. LAD PCI '03,  Cath 2010 showed 40% LAD- medical Rx Myoview  March 2020 negative for ischemia but positive for ST depression- Imdur  added.  Last Assessment & Plan:  Check labs con't meds Followed by car   Cataract    Dr. 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 Echo May 2020- EF 60-65% with mild LVH  Last Assessment & Plan:  Well controlled, no changes to meds. Encouraged heart healthy diet such as the DASH diet and exercise as tolerated.   Eustachian tube dysfunction, bilateral 11/30/2015   History of colonic polyps 07/12/2010   History of GI bleed 04/23/2020   History of left mastoidectomy 02/20/2020   History of placement of ear tubes    Hyperlipidemia associated with type 2 diabetes mellitus 07/10/2007   Hyperthyroidism 07/12/2010   Hypothyroidism 07/10/2007   Internal hemorrhoids    Intractable episodic cluster headache 08/29/2017   Major depressive disorder 07/10/2007   Mastoiditis of both sides 09/21/2016   Migraine headache    Mild neurocognitive disorder due to multiple etiologies 12/25/2020   Non-insulin  dependent type 2 diabetes mellitus 07/06/2010   Osteoarthritis of multiple joints 07/10/2007   Perennial allergic rhinitis 09/22/2016   Postprocedural urinary retention    Presbycusis of both ears 01/23/2018   Skin cancer, basal cell    Thrombocytopenia    TIA (transient ischemic attack) 2006   per patient's report. He was never officially given this diagnosis   Upper  airway cough syndrome 09/25/2017    Past Surgical History:  Procedure Laterality Date   COLONOSCOPY  2011   coronary artery disease status post placement     of drug-eluting stent in the LAD in 2003,eEF 65% then   DEBRIDEMENT AND CLOSURE WOUND N/A 12/08/2020   Procedure: CLOSURE LEFT POST AURICULAR SCALP WOUND;  Surgeon: Arelia Filippo, MD;  Location: [facility];  Service: Plastics;  Laterality: N/A;   esophogeal dilation     fatty tissue removed     from neck 2003   HERNIA REPAIR  2007   Dr. Elon Pacini Patient # [phone]   mastoidectomy with tympanoplasty     MELANOMA EXCISION Left 11/17/2020   Procedure: WIDE LOCAL EXCISION, ADVANCED FLAP CLOSURE LEFT POSTERIOR EAR  MELANOMA;  Surgeon: Aron Shoulders, MD;  Location: [facility];  Service: General;  Laterality: Left;   MELANOMA EXCISION Left 12/01/2020   Procedure: RE-EXCISION LEFT POSTERIOR AURICULAR MELANOMA WITH TEMPORARY CLOSURE;  Surgeon: Aron Shoulders, MD;  Location: [facility];  Service: General;  Laterality: Left;  60 MIN TOTAL   POLYPECTOMY  2011   +TA   right toe bone spur surgery     SKIN FULL THICKNESS GRAFT Left 12/08/2020   Procedure: Full thickness skin graft from left upper arm;  Surgeon: Arelia Filippo, MD;  Location: [facility];  Service: Plastics;  Laterality: Left;   THYROIDECTOMY, PARTIAL  07/2002   right thyroid    TONSILLECTOMY     UPPER GI ENDOSCOPY      Social History   Socioeconomic History   Marital status: Married    Spouse name: Charleen   Number of children: 0   Years of education: 14   Highest education level: Associate degree: occupational, scientist, product/process development, or vocational program  Occupational History   Occupation: retired    Associate Professor: RETIRED    Comment: accountant  Tobacco Use   Smoking status: Former    Current packs/day: 0.00    Types: Cigarettes    Start date: 10/10/1960    Quit date: 10/10/1965    Years since quitting: 59.0   Smokeless tobacco:  Never  Vaping Use   Vaping status: Never Used  Substance and Sexual Activity   Alcohol use: No   Drug use: No   Sexual activity: Yes    Partners: Female  Other Topics Concern   Not on file  Social History Narrative   Pt lives with spouse at private home in 2 story home- he has no children- right handed- he drinks coffee daily, tea sometimes, soda not all the time. Exercise-- treadmill, stationary bike   Right handed   Social Drivers of Health   Tobacco Use: Medium Risk (10/11/2024)   Patient History    Smoking Tobacco Use: Former    Smokeless Tobacco Use: Never    Passive Exposure: Not on file  Financial Resource Strain: Low Risk (05/24/2024)   Overall Financial Resource Strain (CARDIA)    Difficulty of Paying Living Expenses: Not hard at all  Food Insecurity: Low Risk (08/15/2024)   Received from [facility]   Epic    Within the past 12 months, you worried that your food would run out before you got money to buy more: Never true    Within the past 12 months, the food you bought just didn't last and you didn't have money to get more. : Never true  Transportation Needs: No Transportation Needs (08/15/2024)   Received from [facility]    In the past 12 months, has lack of reliable transportation kept you from medical appointments, meetings, work or from getting things needed for daily living? : No  Physical Activity: Sufficiently Active (05/24/2024)   Exercise Vital Sign    Days of Exercise per Week: 7 days    Minutes of Exercise per Session: 30 min  Stress: No Stress Concern Present (05/24/2024)   [employer] of Occupational Health - Occupational Stress Questionnaire    Feeling of Stress: Not at all  Social Connections: Socially Integrated (05/24/2024)   Social Connection and Isolation Panel    Frequency of Communication with Friends and Family: More than three times a week    Frequency of Social Gatherings with Friends and Family: More than three times a  week    Attends Religious Services: More than 4 times per year    Active Member of Clubs or Organizations: Yes    Attends Banker Meetings: More than 4 times per year    Marital Status: Married  Catering Manager Violence: Not At Risk (11/14/2023)   Humiliation, Afraid, Rape, and Kick questionnaire    Fear of Current or Ex-Partner: No    Emotionally Abused: No    Physically Abused: No    Sexually Abused: No  Depression (PHQ2-9): Low Risk (05/28/2024)   Depression (PHQ2-9)    PHQ-2 Score: 0  Alcohol Screen: Low Risk (11/13/2023)   Alcohol Screen    Last Alcohol Screening Score (AUDIT): 0  Housing: Low Risk (08/15/2024)   Received from [facility]   Epic    What is your living situation today?: I have a steady place to live    Think about the place you live. Do you have problems with any of the following? Choose all that apply:: None/None on this list  Utilities: Low Risk (08/15/2024)   Received from [facility]   Utilities    In the past 12 months has the electric, gas, oil, or water company threatened to shut off services in your home? : No  Health Literacy: Adequate Health Literacy (11/14/2023)   B1300 Health Literacy    Frequency of need for help with medical instructions: Never    Family History  Problem Relation Age of Onset   Heart disease Mother    Alzheimer's disease Mother    Arthritis Mother    Heart disease Father        triple by pass   Hearing loss Father    Hypertension Father    Hypertension Brother        Prostate Cancer   Prostate cancer Brother    Alcohol abuse Brother    Hypertension Brother    Asthma Brother    Hypertension Brother    COPD Brother    Colon cancer Neg Hx    Colon polyps Neg Hx    Rectal cancer Neg Hx    Stomach cancer Neg Hx    Esophageal cancer Neg Hx     ROS: no fevers or chills, productive cough, hemoptysis, dysphasia, odynophagia, melena, hematochezia, dysuria, hematuria, rash, seizure activity, orthopnea, PND, pedal  edema, claudication. Remaining systems are negative.  Physical Exam: Well-developed well-nourished in no acute distress.  Skin is warm and dry.  HEENT is normal.  Neck is supple.  Chest is clear to auscultation with normal expansion.  Cardiovascular exam is regular rate and rhythm.  Abdominal exam nontender or distended. No masses palpated. Extremities show no edema. neuro grossly intact   A/P  1 coronary artery disease-he denies chest pain.  Continue aspirin  and statin.  2 hypertension-blood pressure borderline.  I have asked them to follow his blood pressure at home and we will advance medications as needed.  3 hyperlipidemia-continue Zetia  and Zocor .  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  This has resolved.  Redell Shallow, MD    "

## 2024-10-11 ENCOUNTER — Ambulatory Visit: Attending: Cardiology | Admitting: Cardiology

## 2024-10-11 ENCOUNTER — Encounter: Payer: Self-pay | Admitting: Cardiology

## 2024-10-11 VITALS — BP 144/80 | HR 60 | Ht 69.0 in | Wt 159.4 lb

## 2024-10-11 DIAGNOSIS — E785 Hyperlipidemia, unspecified: Secondary | ICD-10-CM | POA: Diagnosis not present

## 2024-10-11 DIAGNOSIS — I2510 Atherosclerotic heart disease of native coronary artery without angina pectoris: Secondary | ICD-10-CM | POA: Insufficient documentation

## 2024-10-11 DIAGNOSIS — I10 Essential (primary) hypertension: Secondary | ICD-10-CM | POA: Insufficient documentation

## 2024-10-11 MED ORDER — EZETIMIBE-SIMVASTATIN 10-20 MG PO TABS: 1.0000 | ORAL_TABLET | Freq: Every day | ORAL | 3 refills | Status: AC

## 2024-10-11 NOTE — Patient Instructions (Signed)

## 2024-11-14 ENCOUNTER — Other Ambulatory Visit: Payer: Self-pay | Admitting: Medical Genetics

## 2024-11-14 ENCOUNTER — Telehealth: Payer: Self-pay | Admitting: *Deleted

## 2024-11-14 ENCOUNTER — Ambulatory Visit: Payer: Medicare Other | Admitting: *Deleted

## 2024-11-14 VITALS — BP 150/70 | HR 57 | Temp 97.5°F | Resp 18 | Ht 69.0 in | Wt 159.8 lb

## 2024-11-14 DIAGNOSIS — Z0000 Encounter for general adult medical examination without abnormal findings: Secondary | ICD-10-CM | POA: Diagnosis not present

## 2024-11-14 NOTE — Telephone Encounter (Signed)
 Please advise in PCP absence.  Pt had AWV today.  Initial BP was 156/68 and repeat 150/70. He reports home readings between 130-170/60-80s. Sees cardiology. He denies chest pain or SOB. Does note that he will get a headache when blood pressure runs high. He has f/u with PCP on 11/28/24. Please advise if any recommendations?

## 2024-11-14 NOTE — Patient Instructions (Addendum)
 [name],  Thank you for taking the time for your Medicare Wellness Visit. I appreciate your continued commitment to your health goals. Please review the care plan we discussed, and feel free to reach out if I can assist you further.  Please note that Annual Wellness Visits do not include a physical exam. Some assessments may be limited, especially if the visit was conducted virtually. If needed, we may recommend an in-person follow-up with your provider.  Goal: To maintain an active lifestyle   Ongoing Care Seeing your primary care provider every 3 to 6 months helps us  monitor your health and provide consistent, personalized care.   Dr Antonio Meth: 11/28/24 2:20pm Medicare AWV:  11/18/25 1pm, in person  Referrals If a referral was made during today's visit and you haven't received any updates within two weeks, please contact the referred provider directly to check on the status.  Dr McCuen: [phone].  Please call to schedule your diabetic eye exam in June  Recommended Screenings:  Health Maintenance  Topic Date Due   Medicare Annual Wellness Visit  11/13/2024   Complete foot exam   11/22/2024   Kidney health urinalysis for diabetes  11/28/2024   Hemoglobin A1C  11/28/2024   COVID-19 Vaccine (9 - Pfizer risk 2025-26 season) 03/19/2025   Eye exam for diabetics  04/05/2025   Yearly kidney function blood test for diabetes  05/28/2025   DTaP/Tdap/Td vaccine (4 - Td or Tdap) 05/14/2031   Pneumococcal Vaccine for age over 10  Completed   Flu Shot  Completed   Zoster (Shingles) Vaccine  Completed   Meningitis B Vaccine  Aged Out   Colon Cancer Screening  Discontinued   Hepatitis C Screening  Discontinued       11/07/2024    8:35 PM  Advanced Directives  Does Patient Have a Medical Advance Directive? Yes  Type of Advance Directive Healthcare Power of Attorney  Does patient want to make changes to medical advance directive? No - Patient declined  Copy of Healthcare Power of  Attorney in Chart? Yes - validated most recent copy scanned in chart (See row information)    Vision: Annual vision screenings are recommended for early detection of glaucoma, cataracts, and diabetic retinopathy. These exams can also reveal signs of chronic conditions such as diabetes and high blood pressure.  Dental: Annual dental screenings help detect early signs of oral cancer, gum disease, and other conditions linked to overall health, including heart disease and diabetes.  Please see the attached documents for additional preventive care recommendations.

## 2024-11-14 NOTE — Progress Notes (Signed)
 "  Chief Complaint  Patient presents with   Medicare Wellness     Subjective:   [name] is a 79 y.o. male who presents for a Medicare Annual Wellness Visit.  Visit info / Clinical Intake: Medicare Wellness Visit Type:: Subsequent Annual Wellness Visit Persons participating in visit and providing information:: patient Medicare Wellness Visit Mode:: In-person (required for WTM) Interpreter Needed?: No Pre-visit prep was completed: yes AWV questionnaire completed by patient prior to visit?: yes Date:: 11/07/24 Living arrangements:: (Patient-Rptd) lives with spouse/significant other Patient's Overall Health Status Rating: (Patient-Rptd) very good Typical amount of pain: (Patient-Rptd) some Does pain affect daily life?: (Patient-Rptd) no Are you currently prescribed opioids?: no  Dietary Habits and Nutritional Risks How many meals a day?: (Patient-Rptd) 3 Eats fruit and vegetables daily?: (Patient-Rptd) yes Most meals are obtained by: (Patient-Rptd) preparing own meals In the last 2 weeks, have you had any of the following?: none Diabetic:: no  Functional Status Activities of Daily Living (to include ambulation/medication): (Patient-Rptd) Independent Ambulation: (Patient-Rptd) Independent Medication Administration: (Patient-Rptd) Independent Home Management (perform basic housework or laundry): (Patient-Rptd) Independent Manage your own finances?: (Patient-Rptd) yes Primary transportation is: (Patient-Rptd) driving Concerns about vision?: no *vision screening is required for WTM* (Up to date with Dr Leslee) Concerns about hearing?: (!) (Patient-Rptd) yes Uses hearing aids?: (!) yes ([employer]ovo)  Fall Screening Falls in the past year?: (Patient-Rptd) 0 Number of falls in past year: 0 Was there an injury with Fall?: 0 Fall Risk Category Calculator: 0 Patient Fall Risk Level: Low Fall Risk  Fall Risk Patient at Risk for Falls Due to: Orthopedic patient Fall risk Follow  up: Falls evaluation completed  Home and Transportation Safety: All rugs have non-skid backing?: (!) (Patient-Rptd) no All stairs or steps have railings?: (Patient-Rptd) yes Grab bars in the bathtub or shower?: (!) (Patient-Rptd) no Have non-skid surface in bathtub or shower?: (!) (Patient-Rptd) no Good home lighting?: (Patient-Rptd) yes Regular seat belt use?: (Patient-Rptd) yes Hospital stays in the last year:: (Patient-Rptd) no  Cognitive Assessment Difficulty concentrating, remembering, or making decisions? : (Patient-Rptd) no Will 6CIT or Mini Cog be Completed: yes What year is it?: 0 points What month is it?: 0 points Give patient an address phrase to remember (5 components): [address], [place]  About what time is it?: 0 points Count backwards from 20 to 1: 0 points Say the months of the year in reverse: 0 points Repeat the address phrase from earlier: 0 points 6 CIT Score: 0 points  Advance Directives (For Healthcare) Does Patient Have a Medical Advance Directive?: Yes Does patient want to make changes to medical advance directive?: No - Patient declined Type of Advance Directive: Healthcare Power of Attorney Copy of Healthcare Power of Attorney in Chart?: Yes - validated most recent copy scanned in chart (See row information)  Reviewed/Updated  Reviewed/Updated: Reviewed All (Medical, Surgical, Family, Medications, Allergies, Care Teams, Patient Goals)    Allergies (verified) Crestor [rosuvastatin calcium ], Nitrofurantoin , Rosuvastatin, Sulfa antibiotics, Sulfamethoxazole-trimethoprim, Sulfasalazine, Amlodipine , Apple, Codeine, Hydrocodone-acetaminophen , and Other   Current Medications (verified) Outpatient Encounter Medications as of 11/14/2024  Medication Sig   AMBULATORY NON FORMULARY MEDICATION Collagen Peptides   aspirin  81 MG tablet Take 81 mg by mouth daily.   azelastine  (ASTELIN ) 0.1 % nasal spray Place 1 spray into both nostrils daily as  needed for rhinitis. Use in each nostril as directed   Boswellia Serrata (BOSWELLIA PO) Take 500 mg by mouth 3 (three) times daily.   cholecalciferol (VITAMIN D) 25  MCG (1000 UNIT) tablet Take 3,000 Units by mouth 3 (three) times daily.   Cinnamon 500 MG capsule Take 1,000 mg by mouth 2 (two) times daily.   Coenzyme Q10 (COQ-10) 100 MG capsule Take 1 capsule (100 mg total) by mouth daily.   Cyanocobalamin (VITAMIN B-12) 1000 MCG SUBL Take 1,000 mcg by mouth daily.   dexamethasone  (DECADRON ) 0.1 % ophthalmic solution For ears   Diclofenac  Sodium 3 % GEL Qid prn   ezetimibe -simvastatin  (VYTORIN ) 10-20 MG tablet Take 1 tablet by mouth daily.   Fluorouracil (TOLAK) 4 % CREA Apply topically.   Ginger, Zingiber officinalis, (GINGER ROOT) 550 MG CAPS Take 550 mg by mouth 2 (two) times daily.   hydrALAZINE  (APRESOLINE ) 100 MG tablet TAKE 1 TABLET BY MOUTH 3 TIMES A DAY   isosorbide  mononitrate (IMDUR ) 30 MG 24 hr tablet Take 1 tablet (30 mg total) by mouth daily.   levothyroxine  (SYNTHROID ) 112 MCG tablet Take 1 tablet (112 mcg total) by mouth daily before breakfast.   metFORMIN  (GLUCOPHAGE ) 500 MG tablet Take 1 tablet (500 mg total) by mouth 2 (two) times daily with a meal.   Moringa Oleifera (MORINGA PO) Take 1 Capful by mouth daily.   Multiple Vitamins-Minerals (THERAGRAN-M ADVANCED 50 PLUS PO) Take 1 tablet by mouth daily.   Nerve Stimulator (CEFALY KIT) DEVI Apply 1 application topically daily at 12 noon. Electrical migraine kit for prevention on treatment of migraine headache   olmesartan  (BENICAR ) 40 MG tablet Take 1 tablet (40 mg total) by mouth daily.   pantoprazole  (PROTONIX ) 40 MG tablet Take 1 tablet (40 mg total) by mouth daily.   propranolol  ER (INDERAL  LA) 160 MG SR capsule Take 1 capsule (160 mg total) by mouth daily.   tamsulosin  (FLOMAX ) 0.4 MG CAPS capsule Take 0.4 mg by mouth daily.   topiramate  (TOPAMAX ) 50 MG tablet Take 1 tablet (50 mg total) by mouth at bedtime.   triamcinolone  cream (KENALOG) 0.1 % Apply 1 Application topically as needed.   ciprofloxacin  (CILOXAN ) 0.3 % ophthalmic solution For ears (Patient not taking: Reported on 11/14/2024)   No facility-administered encounter medications on file as of 11/14/2024.    History: Past Medical History:  Diagnosis Date   Abdominal bruit 07/22/2010   Acute sinusitis 03/26/2015   Acute suppr otitis media w spon rupt ear drum, recur, r ear 04/06/2017   Allergy    Angina pectoris 12/11/2008   Benign essential tremor 08/29/2017   Bilateral sensorineural hearing loss 11/30/2015   CAD S/P percutaneous coronary angioplasty 12/11/2008   LAD PCI '03,  Cath 2010 showed 40% LAD- medical Rx Myoview  March 2020 negative for ischemia but positive for ST depression- Imdur  added.  Formatting of this note might be different from the original. LAD PCI '03,  Cath 2010 showed 40% LAD- medical Rx Myoview  March 2020 negative for ischemia but positive for ST depression- Imdur  added.  Last Assessment & Plan:  Check labs con't meds Followed by car   Cataract    Dr. 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 Echo May 2020- EF 60-65% with mild LVH  Last  Assessment & Plan:  Well controlled, no changes to meds. Encouraged heart healthy diet such as the DASH diet and exercise as tolerated.   Eustachian tube dysfunction, bilateral 11/30/2015   History of colonic polyps 07/12/2010   History of GI bleed 04/23/2020   History of left mastoidectomy 02/20/2020   History of placement of ear tubes    Hyperlipidemia associated with type 2 diabetes  mellitus 07/10/2007   Hyperthyroidism 07/12/2010   Hypothyroidism 07/10/2007   Internal hemorrhoids    Intractable episodic cluster headache 08/29/2017   Major depressive disorder 07/10/2007   Mastoiditis of both sides 09/21/2016   Migraine headache    Mild neurocognitive disorder due to multiple etiologies 12/25/2020   Non-insulin  dependent type 2 diabetes mellitus 07/06/2010   Osteoarthritis of multiple joints 07/10/2007   Perennial allergic rhinitis 09/22/2016   Postprocedural urinary retention    Presbycusis of both ears 01/23/2018   Skin cancer, basal cell    Thrombocytopenia    TIA (transient ischemic attack) 2006   per patient's report. He was never officially given this diagnosis   Upper airway cough syndrome 09/25/2017   Past Surgical History:  Procedure Laterality Date   COLONOSCOPY  2011   coronary artery disease status post placement     of drug-eluting stent in the LAD in 2003,eEF 65% then   DEBRIDEMENT AND CLOSURE WOUND N/A 12/08/2020   Procedure: CLOSURE LEFT POST AURICULAR SCALP WOUND;  Surgeon: Arelia Filippo, MD;  Location: [facility];  Service: Plastics;  Laterality: N/A;   esophogeal dilation     fatty tissue removed     from neck 2003   HERNIA REPAIR  2007   Dr. Elon Pacini Patient # [phone]   mastoidectomy with tympanoplasty     MELANOMA EXCISION Left 11/17/2020   Procedure: WIDE LOCAL EXCISION, ADVANCED FLAP CLOSURE LEFT POSTERIOR EAR MELANOMA;  Surgeon: Aron Shoulders, MD;  Location: [facility];  Service: General;  Laterality: Left;   MELANOMA EXCISION Left 12/01/2020   Procedure: RE-EXCISION LEFT POSTERIOR AURICULAR MELANOMA WITH TEMPORARY CLOSURE;  Surgeon: Aron Shoulders, MD;  Location: [facility];  Service: General;  Laterality: Left;  60 MIN TOTAL   POLYPECTOMY  2011   +TA   right toe bone spur surgery     SKIN FULL THICKNESS GRAFT Left 12/08/2020   Procedure: Full thickness skin graft from left upper arm;  Surgeon:  Arelia Filippo, MD;  Location: [facility];  Service: Plastics;  Laterality: Left;   THYROIDECTOMY, PARTIAL  07/2002   right thyroid    TONSILLECTOMY     UPPER GI ENDOSCOPY     Family History  Problem Relation Age of Onset   Heart disease Mother    Alzheimer's disease Mother    Arthritis Mother    Heart disease Father        triple by pass   Hearing loss Father    Hypertension Father    Hypertension Brother        Prostate Cancer   Prostate cancer Brother    Alcohol abuse Brother    Hypertension Brother    Asthma Brother    Hypertension Brother    COPD Brother    Colon cancer Neg Hx    Colon polyps Neg Hx    Rectal cancer Neg Hx    Stomach cancer Neg Hx    Esophageal cancer Neg Hx    Social History   Occupational History   Occupation: retired    Associate Professor: RETIRED  Comment: accountant  Tobacco Use   Smoking status: Former    Current packs/day: 0.00    Types: Cigarettes    Start date: 10/10/1960    Quit date: 10/10/1965    Years since quitting: 59.1   Smokeless tobacco: Never  Vaping Use   Vaping status: Never Used  Substance and Sexual Activity   Alcohol use: No   Drug use: No   Sexual activity: Yes    Partners: Female   Tobacco Counseling Counseling given: Not Answered  SDOH Screenings   Food Insecurity: No Food Insecurity (11/14/2024)  Housing: Low Risk (11/07/2024)  Transportation Needs: No Transportation Needs (11/07/2024)  Utilities: Not At Risk (11/14/2024)  Alcohol Screen: Low Risk (11/13/2023)  Depression (PHQ2-9): Low Risk (11/14/2024)  Financial Resource Strain: Low Risk (11/07/2024)  Physical Activity: Sufficiently Active (11/07/2024)  Social Connections: Socially Integrated (11/07/2024)  Stress: No Stress Concern Present (11/07/2024)  Tobacco Use: Medium Risk (11/14/2024)  Health Literacy: Adequate Health Literacy (11/14/2023)   See flowsheets for full screening details  Depression Screen PHQ 2 & 9 Depression Scale- Over the past 2 weeks,  how often have you been bothered by any of the following problems? Little interest or pleasure in doing things: 0 Feeling down, depressed, or hopeless (PHQ Adolescent also includes...irritable): 0 PHQ-2 Total Score: 0 Trouble falling or staying asleep, or sleeping too much: 0 Feeling tired or having little energy: 0 Poor appetite or overeating (PHQ Adolescent also includes...weight loss): 0 Feeling bad about yourself - or that you are a failure or have let yourself or your family down: 0 Trouble concentrating on things, such as reading the newspaper or watching television (PHQ Adolescent also includes...like school work): 0 Moving or speaking so slowly that other people could have noticed. Or the opposite - being so fidgety or restless that you have been moving around a lot more than usual: 0 Thoughts that you would be better off dead, or of hurting yourself in some way: 0 PHQ-9 Total Score: 0 If you checked off any problems, how difficult have these problems made it for you to do your work, take care of things at home, or get along with other people?: Not difficult at all     Goals Addressed             This Visit's Progress    To maintain an active lifestyle               Objective:    Today's Vitals   11/14/24 1406 11/14/24 1449  BP: (!) 156/68 (!) 150/70  Pulse: (!) 57   Resp: 18   Temp: (!) 97.5 F (36.4 C)   TempSrc: Oral   SpO2: 98%   Weight: 159 lb 12.8 oz (72.5 kg)   Height: 5' 9 (1.753 m)    Body mass index is 23.6 kg/m.  Hearing/Vision screen No results found. Immunizations and Health Maintenance Health Maintenance  Topic Date Due   FOOT EXAM  11/22/2024   Diabetic kidney evaluation - Urine ACR  11/28/2024   HEMOGLOBIN A1C  11/28/2024   COVID-19 Vaccine (9 - Pfizer risk 2025-26 season) 03/19/2025   OPHTHALMOLOGY EXAM  04/05/2025   Diabetic kidney evaluation - eGFR measurement  05/28/2025   Medicare Annual Wellness (AWV)  11/14/2025   DTaP/Tdap/Td  (4 - Td or Tdap) 05/14/2031   Pneumococcal Vaccine: 50+ Years  Completed   Influenza Vaccine  Completed   Zoster Vaccines- Shingrix  Completed   Meningococcal B Vaccine  Aged Out  Colonoscopy  Discontinued   Hepatitis C Screening  Discontinued        Assessment/Plan:  This is a routine wellness examination for Trei.  Patient Care Team: Antonio Meth, Jamee SAUNDERS, DO as PCP - General Pietro Redell RAMAN, MD as PCP - Cardiology (Cardiology) Pietro Redell RAMAN, MD as Consulting Physician (Cardiology) Renda Glance, MD as Consulting Physician (Urology) Roark Rush, MD as Consulting Physician (Otolaryngology) Aneita Gwendlyn DASEN, MD (Inactive) as Consulting Physician (Gastroenterology) Leslee Reusing, MD as Consulting Physician (Ophthalmology) Skeet Juliene SAUNDERS, DO as Consulting Physician (Neurology) Elnor Rome BROCKS, MD as Referring Physician (Dermatology) Shari Sieving, MD as Consulting Physician (Orthopedic Surgery) Gaspar Kung, MD as Referring Physician (Orthopedic Surgery) Peter Clamp, AUD (Audiology) Thaddeus Locus, MD as Attending Physician (Otolaryngology)  I have personally reviewed and noted the following in the patients chart:   Medical and social history Use of alcohol, tobacco or illicit drugs  Current medications and supplements including opioid prescriptions. Functional ability and status Nutritional status Physical activity Advanced directives List of other physicians Hospitalizations, surgeries, and ER visits in previous 12 months Vitals Screenings to include cognitive, depression, and falls Referrals and appointments  No orders of the defined types were placed in this encounter.  In addition, I have reviewed and discussed with patient certain preventive protocols, quality metrics, and best practice recommendations. A written personalized care plan for preventive services as well as general preventive health recommendations were provided to patient.   Lolita Libra, CMA   11/14/2024   Return in 1 year (on 11/14/2025).  After Visit Summary: (In Person-Printed) AVS printed and given to the patient  Nurse Notes: HM Addressed: due for DM eye exam in June. No longer requires colonoscopy.   See phone note  "

## 2024-11-15 NOTE — Telephone Encounter (Signed)
 Left message to return my call.

## 2024-11-15 NOTE — Telephone Encounter (Signed)
 Spoke with pt. He states BP today is 132/70. He feels he is ok to wait until the 19th for recheck and will reach out if anything changes.

## 2024-11-28 ENCOUNTER — Ambulatory Visit: Admitting: Family Medicine

## 2025-04-28 ENCOUNTER — Ambulatory Visit: Admitting: Neurology

## 2025-11-18 ENCOUNTER — Ambulatory Visit
